# Patient Record
Sex: Male | Born: 1945 | Race: Black or African American | Hispanic: No | Marital: Married | Smoking: Never smoker
Health system: Southern US, Community
[De-identification: ages and names within clinical notes are randomized; demographics above are authoritative.]

## PROBLEM LIST (undated history)

## (undated) DIAGNOSIS — M1990 Unspecified osteoarthritis, unspecified site: Secondary | ICD-10-CM

## (undated) DIAGNOSIS — G4730 Sleep apnea, unspecified: Secondary | ICD-10-CM

## (undated) DIAGNOSIS — I10 Essential (primary) hypertension: Secondary | ICD-10-CM

## (undated) DIAGNOSIS — E119 Type 2 diabetes mellitus without complications: Secondary | ICD-10-CM

## (undated) DIAGNOSIS — K219 Gastro-esophageal reflux disease without esophagitis: Secondary | ICD-10-CM

## (undated) HISTORY — PX: NECK SURGERY: SHX720

## (undated) HISTORY — PX: BACK SURGERY: SHX140

## (undated) HISTORY — PX: SHOULDER SURGERY: SHX246

## (undated) HISTORY — DX: Sleep apnea, unspecified: G47.30

## (undated) HISTORY — PX: TONSILLECTOMY: SUR1361

## (undated) HISTORY — PX: EYE SURGERY: SHX253

## (undated) HISTORY — DX: Unspecified osteoarthritis, unspecified site: M19.90

## (undated) HISTORY — PX: APPENDECTOMY: SHX54

## (undated) HISTORY — DX: Gastro-esophageal reflux disease without esophagitis: K21.9

---

## 1973-09-22 HISTORY — PX: BACK SURGERY: SHX140

## 1997-09-22 HISTORY — PX: NECK SURGERY: SHX720

## 1998-09-22 HISTORY — PX: SHOULDER SURGERY: SHX246

## 2014-09-20 ENCOUNTER — Encounter (INDEPENDENT_AMBULATORY_CARE_PROVIDER_SITE_OTHER): Payer: Self-pay

## 2014-09-20 ENCOUNTER — Encounter (INDEPENDENT_AMBULATORY_CARE_PROVIDER_SITE_OTHER): Payer: Self-pay | Admitting: *Deleted

## 2014-12-21 ENCOUNTER — Other Ambulatory Visit (INDEPENDENT_AMBULATORY_CARE_PROVIDER_SITE_OTHER): Payer: Self-pay | Admitting: *Deleted

## 2014-12-21 DIAGNOSIS — Z86010 Personal history of colonic polyps: Secondary | ICD-10-CM

## 2015-01-19 ENCOUNTER — Telehealth (INDEPENDENT_AMBULATORY_CARE_PROVIDER_SITE_OTHER): Payer: Self-pay | Admitting: *Deleted

## 2015-01-19 DIAGNOSIS — Z1211 Encounter for screening for malignant neoplasm of colon: Secondary | ICD-10-CM

## 2015-01-19 MED ORDER — PEG 3350-KCL-NA BICARB-NACL 420 G PO SOLR: 4000.0000 mL | Freq: Once | ORAL | Status: DC

## 2015-01-19 NOTE — Telephone Encounter (Signed)
Patient needs trilyte 

## 2015-01-26 ENCOUNTER — Telehealth (INDEPENDENT_AMBULATORY_CARE_PROVIDER_SITE_OTHER): Payer: Self-pay | Admitting: *Deleted

## 2015-01-26 NOTE — Telephone Encounter (Signed)
PCP: shyam balakrishnan   Procedure: tcs  Reason/Indication:  Hx polyps  Has patient had this procedure before?  Yes, 2009 -- sacnned  If so, when, by whom and where?    Is there a family history of colon cancer?  no  Who?  What age when diagnosed?    Is patient diabetic?   no      Does patient have prosthetic heart valve?  no  Do you have a pacemaker?  no  Has patient ever had endocarditis? no  Has patient had joint replacement within last 12 months?  no  Does patient tend to be constipated or take laxatives? no  Is patient on Coumadin, Plavix and/or Aspirin? no  Medications: lisinopril 20 mg daily, metformin 500 mg daily  Allergies: nkda  Medication Adjustment: hold metformin evening before procedure  Procedure date & time: 02/21/15 at 1030

## 2015-02-01 NOTE — Telephone Encounter (Signed)
agree

## 2015-02-21 ENCOUNTER — Encounter (HOSPITAL_COMMUNITY)
Admission: RE | Disposition: A | Discharge status: Home / Self Care | Payer: Self-pay | Source: Ambulatory Visit | Attending: Internal Medicine

## 2015-02-21 ENCOUNTER — Encounter (HOSPITAL_COMMUNITY): Payer: Self-pay | Admitting: *Deleted

## 2015-02-21 ENCOUNTER — Ambulatory Visit (HOSPITAL_COMMUNITY)
Admission: RE | Admit: 2015-02-21 | Discharge: 2015-02-21 | Disposition: A | Discharge status: Home / Self Care | Payer: Medicare Other | Source: Ambulatory Visit | Attending: Internal Medicine | Admitting: Internal Medicine

## 2015-02-21 DIAGNOSIS — Z7982 Long term (current) use of aspirin: Secondary | ICD-10-CM | POA: Diagnosis not present

## 2015-02-21 DIAGNOSIS — Z79899 Other long term (current) drug therapy: Secondary | ICD-10-CM | POA: Diagnosis not present

## 2015-02-21 DIAGNOSIS — E119 Type 2 diabetes mellitus without complications: Secondary | ICD-10-CM | POA: Diagnosis not present

## 2015-02-21 DIAGNOSIS — D122 Benign neoplasm of ascending colon: Secondary | ICD-10-CM | POA: Insufficient documentation

## 2015-02-21 DIAGNOSIS — D125 Benign neoplasm of sigmoid colon: Secondary | ICD-10-CM | POA: Diagnosis not present

## 2015-02-21 DIAGNOSIS — Z86010 Personal history of colonic polyps: Secondary | ICD-10-CM | POA: Insufficient documentation

## 2015-02-21 DIAGNOSIS — K644 Residual hemorrhoidal skin tags: Secondary | ICD-10-CM | POA: Insufficient documentation

## 2015-02-21 DIAGNOSIS — Z09 Encounter for follow-up examination after completed treatment for conditions other than malignant neoplasm: Secondary | ICD-10-CM | POA: Diagnosis present

## 2015-02-21 DIAGNOSIS — K648 Other hemorrhoids: Secondary | ICD-10-CM | POA: Diagnosis not present

## 2015-02-21 DIAGNOSIS — D123 Benign neoplasm of transverse colon: Secondary | ICD-10-CM

## 2015-02-21 DIAGNOSIS — I10 Essential (primary) hypertension: Secondary | ICD-10-CM | POA: Diagnosis not present

## 2015-02-21 DIAGNOSIS — K5730 Diverticulosis of large intestine without perforation or abscess without bleeding: Secondary | ICD-10-CM | POA: Diagnosis not present

## 2015-02-21 HISTORY — PX: COLONOSCOPY: SHX5424

## 2015-02-21 HISTORY — DX: Type 2 diabetes mellitus without complications: E11.9

## 2015-02-21 HISTORY — DX: Essential (primary) hypertension: I10

## 2015-02-21 SURGERY — COLONOSCOPY
Anesthesia: Moderate Sedation

## 2015-02-21 MED ORDER — MIDAZOLAM HCL 5 MG/5ML IJ SOLN
INTRAMUSCULAR | Status: DC
Start: 2015-02-21 — End: 2015-02-21
  Filled 2015-02-21: qty 10

## 2015-02-21 MED ORDER — STERILE WATER FOR IRRIGATION IR SOLN
Status: DC | PRN
  Administered 2015-02-21: 10:00:00

## 2015-02-21 MED ORDER — SODIUM CHLORIDE 0.9 % IV SOLN
INTRAVENOUS | Status: DC
  Administered 2015-02-21: 1000 mL via INTRAVENOUS

## 2015-02-21 MED ORDER — MEPERIDINE HCL 50 MG/ML IJ SOLN
INTRAMUSCULAR | Status: DC | PRN
  Administered 2015-02-21 (×2): 25 mg via INTRAVENOUS

## 2015-02-21 MED ORDER — MEPERIDINE HCL 50 MG/ML IJ SOLN
INTRAMUSCULAR | Status: AC
  Filled 2015-02-21: qty 1

## 2015-02-21 MED ORDER — MIDAZOLAM HCL 5 MG/5ML IJ SOLN
INTRAMUSCULAR | Status: DC | PRN
  Administered 2015-02-21: 1 mg via INTRAVENOUS
  Administered 2015-02-21 (×3): 2 mg via INTRAVENOUS

## 2015-02-21 NOTE — Op Note (Signed)
COLONOSCOPY PROCEDURE REPORT  PATIENT:  Lawrence [name]  MR#:  [id number] Birthdate:  19-Apr-1946, 69 y.o., male Endoscopist:  Dr. Rogene Houston, MD Referred By:  Dr. Lanier Clam, M.D.  Procedure Date: 02/21/2015  Procedure:   Colonoscopy  Indications:  Patient is 69 year old African-American male with small tubular adenoma removed 7 years ago. He is here for surveillance colonoscopy. He has no GI symptoms. Family history is negative for CRC.  Informed Consent:  The procedure and risks were reviewed with the patient and informed consent was obtained.  Medications:  Demerol 50 mg IV Versed 7 mg IV  Description of procedure:  After a digital rectal exam was performed, that colonoscope was advanced from the anus through the rectum and colon to the area of the cecum, ileocecal valve and appendiceal orifice. The cecum was deeply intubated. These structures were well-seen and photographed for the record. From the level of the cecum and ileocecal valve, the scope was slowly and cautiously withdrawn. The mucosal surfaces were carefully surveyed utilizing scope tip to flexion to facilitate fold flattening as needed. The scope was pulled down into the rectum where a thorough exam including retroflexion was performed.  Findings:   Prep satisfactory. Two small polyps were cold snared from ascending colon. Two small polyps were cold snared from transverse colon and one was lost. All of these polyps were submitted together. There was question of another polyp in the region of splenic flexure but none found on the way out. Normal rectal mucosa. Dominant hemorrhoids below the dentate line.   Therapeutic/Diagnostic Maneuvers Performed:  See above  Complications:  None  Cecal Withdrawal Time:  31 minutes  Impression:  Examination performed to cecum. Four  small polyps were cold snared and one was lost. These 3 polyps were submitted together. Two of these are located at ascending colon and  two at transverse colon. External hemorrhoids.  Recommendations:  Standard instructions given. I will contact patient with biopsy results and further recommendations.  REHMAN,NAJEEB U  02/21/2015 11:40 AM  CC: Dr. Lanier Clam, MD & Dr. Rayne Du ref. provider found

## 2015-02-21 NOTE — Discharge Instructions (Signed)
Resume usual medications and diet. No driving for 24 hours. Physician will call with biopsy results.Colonoscopy A colonoscopy is an exam to look at the entire large intestine (colon). This exam can help find problems such as tumors, polyps, inflammation, and areas of bleeding. The exam takes about 1 hour.  LET [facility] CARE PROVIDER KNOW ABOUT:   Any allergies you have.  All medicines you are taking, including vitamins, herbs, eye drops, creams, and over-the-counter medicines.  Previous problems you or members of your family have had with the use of anesthetics.  Any blood disorders you have.  Previous surgeries you have had.  Medical conditions you have. RISKS AND COMPLICATIONS  Generally, this is a safe procedure. However, as with any procedure, complications can occur. Possible complications include:  Bleeding.  Tearing or rupture of the colon wall.  Reaction to medicines given during the exam.  Infection (rare). BEFORE THE PROCEDURE   Ask your health care provider about changing or stopping your regular medicines.  You may be prescribed an oral bowel prep. This involves drinking a large amount of medicated liquid, starting the day before your procedure. The liquid will cause you to have multiple loose stools until your stool is almost clear or light green. This cleans out your colon in preparation for the procedure.  Do not eat or drink anything else once you have started the bowel prep, unless your health care provider tells you it is safe to do so.  Arrange for someone to drive you home after the procedure. PROCEDURE   You will be given medicine to help you relax (sedative).  You will lie on your side with your knees bent.  A long, flexible tube with a light and camera on the end (colonoscope) will be inserted through the rectum and into the colon. The camera sends video back to a computer screen as it moves through the colon. The colonoscope also releases carbon  dioxide gas to inflate the colon. This helps your health care provider see the area better.  During the exam, your health care provider may take a small tissue sample (biopsy) to be examined under a microscope if any abnormalities are found.  The exam is finished when the entire colon has been viewed. AFTER THE PROCEDURE   Do not drive for 24 hours after the exam.  You may have a small amount of blood in your stool.  You may pass moderate amounts of gas and have mild abdominal cramping or bloating. This is caused by the gas used to inflate your colon during the exam.  Ask when your test results will be ready and how you will get your results. Make sure you get your test results. Document Released: 09/05/2000 Document Revised: 06/29/2013 Document Reviewed: 05/16/2013 [facility] Patient Information 2015 [place], [place]. This information is not intended to replace advice given to you by your health care provider. Make sure you discuss any questions you have with your health care provider.  Biopsy Care After Refer to this sheet in the next few weeks. These instructions provide you with information on caring for yourself after your procedure. Your caregiver may also give you more specific instructions. Your treatment has been planned according to current medical practices, but problems sometimes occur. Call your caregiver if you have any problems or questions after your procedure. If you had a fine needle biopsy, you may have soreness at the biopsy site for 1 to 2 days. If you had an open biopsy, you may have soreness  at the biopsy site for 3 to 4 days. HOME CARE INSTRUCTIONS   You may resume normal diet and activities as directed.  Change bandages (dressings) as directed. If your wound was closed with a skin glue (adhesive), it will wear off and begin to peel in 7 days.  Only take over-the-counter or prescription medicines for pain, discomfort, or fever as directed by your caregiver.  Ask your  caregiver when you can bathe and get your wound wet. SEEK IMMEDIATE MEDICAL CARE IF:   You have increased bleeding (more than a small spot) from the biopsy site.  You notice redness, swelling, or increasing pain at the biopsy site.  You have pus coming from the biopsy site.  You have a fever.  You notice a bad smell coming from the biopsy site or dressing.  You have a rash, have difficulty breathing, or have any allergic problems. MAKE SURE YOU:   Understand these instructions.  Will watch your condition.  Will get help right away if you are not doing well or get worse. Document Released: 03/28/2005 Document Revised: 12/01/2011 Document Reviewed: 03/06/2011 [facility] Patient Information 2015 [place], [place]. This information is not intended to replace advice given to you by your health care provider. Make sure you discuss any questions you have with your health care provider.

## 2015-02-21 NOTE — H&P (Signed)
Lawrence [name] is an 69 y.o. male.   Chief Complaint: Patient is here for colonoscopy. HPI: Patient is 69 year old African-American male was here for surveillance colonoscopy. His last exam was in 2009. He had single small adenoma removed. Patient was advised to come back in 7 years rather than 5 or 10. He has no GI complaints. He denies abdominal pain change in bowel habits or rectal bleeding. Family history is negative for CRC.  Past Medical History  Diagnosis Date  . Hypertension   . Diabetes mellitus without complication     Past Surgical History  Procedure Laterality Date  . Neck surgery    . Back surgery      History reviewed. No pertinent family history. Social History:  reports that he has never smoked. He does not have any smokeless tobacco history on file. He reports that he does not drink alcohol or use illicit drugs.  Allergies: No Known Allergies  Medications Prior to Admission  Medication Sig Dispense Refill  . aspirin EC 81 MG tablet Take 81 mg by mouth every other day.    . lisinopril (PRINIVIL,ZESTRIL) 20 MG tablet Take 20 mg by mouth daily.    . metFORMIN (GLUCOPHAGE) 500 MG tablet Take 500 mg by mouth 2 (two) times daily with a meal.    . Multiple Vitamins-Minerals (CENTRUM) tablet Take 1 tablet by mouth daily.    . Multiple Vitamins-Minerals (PRESERVISION AREDS 2 PO) Take 1 tablet by mouth daily.    . Omega-3 Fatty Acids (FISH OIL PO) Take 1 capsule by mouth daily.    . polyethylene glycol-electrolytes (NULYTELY/GOLYTELY) 420 G solution Take 4,000 mLs by mouth once. 4000 mL 0  . Vitamin Mixture (ESTER-C PO) Take 1 tablet by mouth daily.      No results found for this or any previous visit (from the past 48 hour(s)). No results found.  ROS  Blood pressure 167/82, pulse 81, temperature 97.8 F (36.6 C), temperature source Oral, resp. rate 15, height 6\' 1"  (1.854 m), weight 190 lb (86.183 kg), SpO2 99 %. Physical Exam  Constitutional: He appears  well-developed and well-nourished.  HENT:  Mouth/Throat: Oropharynx is clear and moist.  Eyes: Conjunctivae are normal. No scleral icterus.  Neck: No thyromegaly present.  Cardiovascular: Normal rate, regular rhythm and normal heart sounds.   No murmur heard. Respiratory: Effort normal and breath sounds normal.  GI: Soft. He exhibits no distension and no mass. There is no tenderness.   appendectomy scar  Musculoskeletal: He exhibits no edema.  Lymphadenopathy:    He has no cervical adenopathy.  Neurological: He is alert.  Skin: Skin is warm and dry.     Assessment/Plan History of colonic tubular adenoma. Surveillance colonoscopy.  Carmelia Tiner U 02/21/2015, 10:29 AM

## 2015-02-22 ENCOUNTER — Encounter (HOSPITAL_COMMUNITY): Payer: Self-pay | Admitting: Internal Medicine

## 2015-02-26 ENCOUNTER — Encounter (INDEPENDENT_AMBULATORY_CARE_PROVIDER_SITE_OTHER): Payer: Self-pay | Admitting: *Deleted

## 2016-04-16 ENCOUNTER — Encounter (INDEPENDENT_AMBULATORY_CARE_PROVIDER_SITE_OTHER): Payer: Self-pay | Admitting: Ophthalmology

## 2016-12-05 ENCOUNTER — Encounter (INDEPENDENT_AMBULATORY_CARE_PROVIDER_SITE_OTHER): Payer: Medicare HMO | Admitting: Ophthalmology

## 2016-12-05 DIAGNOSIS — E113512 Type 2 diabetes mellitus with proliferative diabetic retinopathy with macular edema, left eye: Secondary | ICD-10-CM

## 2016-12-05 DIAGNOSIS — H35033 Hypertensive retinopathy, bilateral: Secondary | ICD-10-CM

## 2016-12-05 DIAGNOSIS — E11311 Type 2 diabetes mellitus with unspecified diabetic retinopathy with macular edema: Secondary | ICD-10-CM | POA: Diagnosis not present

## 2016-12-05 DIAGNOSIS — E113311 Type 2 diabetes mellitus with moderate nonproliferative diabetic retinopathy with macular edema, right eye: Secondary | ICD-10-CM

## 2016-12-05 DIAGNOSIS — H43813 Vitreous degeneration, bilateral: Secondary | ICD-10-CM | POA: Diagnosis not present

## 2016-12-05 DIAGNOSIS — I10 Essential (primary) hypertension: Secondary | ICD-10-CM | POA: Diagnosis not present

## 2016-12-18 ENCOUNTER — Other Ambulatory Visit (INDEPENDENT_AMBULATORY_CARE_PROVIDER_SITE_OTHER): Payer: Medicare HMO | Admitting: Ophthalmology

## 2016-12-18 DIAGNOSIS — E113511 Type 2 diabetes mellitus with proliferative diabetic retinopathy with macular edema, right eye: Secondary | ICD-10-CM

## 2016-12-18 DIAGNOSIS — E11311 Type 2 diabetes mellitus with unspecified diabetic retinopathy with macular edema: Secondary | ICD-10-CM

## 2017-02-26 ENCOUNTER — Other Ambulatory Visit (INDEPENDENT_AMBULATORY_CARE_PROVIDER_SITE_OTHER): Payer: Medicare HMO | Admitting: Ophthalmology

## 2017-02-26 DIAGNOSIS — E113313 Type 2 diabetes mellitus with moderate nonproliferative diabetic retinopathy with macular edema, bilateral: Secondary | ICD-10-CM | POA: Diagnosis not present

## 2017-02-26 DIAGNOSIS — E11311 Type 2 diabetes mellitus with unspecified diabetic retinopathy with macular edema: Secondary | ICD-10-CM

## 2017-06-29 ENCOUNTER — Ambulatory Visit (INDEPENDENT_AMBULATORY_CARE_PROVIDER_SITE_OTHER): Payer: Medicare HMO | Admitting: Ophthalmology

## 2017-06-29 DIAGNOSIS — H35033 Hypertensive retinopathy, bilateral: Secondary | ICD-10-CM

## 2017-06-29 DIAGNOSIS — E113592 Type 2 diabetes mellitus with proliferative diabetic retinopathy without macular edema, left eye: Secondary | ICD-10-CM

## 2017-06-29 DIAGNOSIS — E11311 Type 2 diabetes mellitus with unspecified diabetic retinopathy with macular edema: Secondary | ICD-10-CM | POA: Diagnosis not present

## 2017-06-29 DIAGNOSIS — H26491 Other secondary cataract, right eye: Secondary | ICD-10-CM | POA: Diagnosis not present

## 2017-06-29 DIAGNOSIS — H43813 Vitreous degeneration, bilateral: Secondary | ICD-10-CM

## 2017-06-29 DIAGNOSIS — H2512 Age-related nuclear cataract, left eye: Secondary | ICD-10-CM | POA: Diagnosis not present

## 2017-06-29 DIAGNOSIS — I10 Essential (primary) hypertension: Secondary | ICD-10-CM | POA: Diagnosis not present

## 2017-06-29 DIAGNOSIS — E113311 Type 2 diabetes mellitus with moderate nonproliferative diabetic retinopathy with macular edema, right eye: Secondary | ICD-10-CM

## 2017-07-09 ENCOUNTER — Encounter (INDEPENDENT_AMBULATORY_CARE_PROVIDER_SITE_OTHER): Payer: Medicare HMO | Admitting: Ophthalmology

## 2017-08-12 ENCOUNTER — Encounter (INDEPENDENT_AMBULATORY_CARE_PROVIDER_SITE_OTHER): Payer: Medicare HMO | Admitting: Ophthalmology

## 2017-08-12 DIAGNOSIS — H2701 Aphakia, right eye: Secondary | ICD-10-CM

## 2018-02-10 ENCOUNTER — Encounter (INDEPENDENT_AMBULATORY_CARE_PROVIDER_SITE_OTHER): Payer: Medicare HMO | Admitting: Ophthalmology

## 2018-02-19 ENCOUNTER — Encounter (INDEPENDENT_AMBULATORY_CARE_PROVIDER_SITE_OTHER): Payer: Medicare HMO | Admitting: Ophthalmology

## 2018-02-19 DIAGNOSIS — E113311 Type 2 diabetes mellitus with moderate nonproliferative diabetic retinopathy with macular edema, right eye: Secondary | ICD-10-CM | POA: Diagnosis not present

## 2018-02-19 DIAGNOSIS — H35033 Hypertensive retinopathy, bilateral: Secondary | ICD-10-CM | POA: Diagnosis not present

## 2018-02-19 DIAGNOSIS — H43813 Vitreous degeneration, bilateral: Secondary | ICD-10-CM

## 2018-02-19 DIAGNOSIS — I10 Essential (primary) hypertension: Secondary | ICD-10-CM | POA: Diagnosis not present

## 2018-02-19 DIAGNOSIS — E113392 Type 2 diabetes mellitus with moderate nonproliferative diabetic retinopathy without macular edema, left eye: Secondary | ICD-10-CM | POA: Diagnosis not present

## 2018-07-05 ENCOUNTER — Encounter (INDEPENDENT_AMBULATORY_CARE_PROVIDER_SITE_OTHER): Payer: Medicare HMO | Admitting: Ophthalmology

## 2018-07-05 DIAGNOSIS — H35033 Hypertensive retinopathy, bilateral: Secondary | ICD-10-CM

## 2018-07-05 DIAGNOSIS — E113312 Type 2 diabetes mellitus with moderate nonproliferative diabetic retinopathy with macular edema, left eye: Secondary | ICD-10-CM

## 2018-07-05 DIAGNOSIS — H59032 Cystoid macular edema following cataract surgery, left eye: Secondary | ICD-10-CM

## 2018-07-05 DIAGNOSIS — E113391 Type 2 diabetes mellitus with moderate nonproliferative diabetic retinopathy without macular edema, right eye: Secondary | ICD-10-CM

## 2018-07-05 DIAGNOSIS — E11311 Type 2 diabetes mellitus with unspecified diabetic retinopathy with macular edema: Secondary | ICD-10-CM

## 2018-07-05 DIAGNOSIS — I10 Essential (primary) hypertension: Secondary | ICD-10-CM

## 2018-07-05 DIAGNOSIS — H43813 Vitreous degeneration, bilateral: Secondary | ICD-10-CM

## 2018-08-16 ENCOUNTER — Encounter (INDEPENDENT_AMBULATORY_CARE_PROVIDER_SITE_OTHER): Payer: Medicare HMO | Admitting: Ophthalmology

## 2018-08-17 ENCOUNTER — Encounter (INDEPENDENT_AMBULATORY_CARE_PROVIDER_SITE_OTHER): Payer: Medicare HMO | Admitting: Ophthalmology

## 2018-08-17 DIAGNOSIS — H35033 Hypertensive retinopathy, bilateral: Secondary | ICD-10-CM

## 2018-08-17 DIAGNOSIS — E11311 Type 2 diabetes mellitus with unspecified diabetic retinopathy with macular edema: Secondary | ICD-10-CM | POA: Diagnosis not present

## 2018-08-17 DIAGNOSIS — E113313 Type 2 diabetes mellitus with moderate nonproliferative diabetic retinopathy with macular edema, bilateral: Secondary | ICD-10-CM | POA: Diagnosis not present

## 2018-08-17 DIAGNOSIS — H59031 Cystoid macular edema following cataract surgery, right eye: Secondary | ICD-10-CM

## 2018-08-17 DIAGNOSIS — I10 Essential (primary) hypertension: Secondary | ICD-10-CM | POA: Diagnosis not present

## 2018-08-17 DIAGNOSIS — H43813 Vitreous degeneration, bilateral: Secondary | ICD-10-CM

## 2018-08-27 ENCOUNTER — Encounter (INDEPENDENT_AMBULATORY_CARE_PROVIDER_SITE_OTHER): Payer: Medicare HMO | Admitting: Ophthalmology

## 2018-09-30 ENCOUNTER — Encounter (INDEPENDENT_AMBULATORY_CARE_PROVIDER_SITE_OTHER): Payer: Medicare HMO | Admitting: Ophthalmology

## 2018-10-26 ENCOUNTER — Encounter (INDEPENDENT_AMBULATORY_CARE_PROVIDER_SITE_OTHER): Payer: Medicare HMO | Admitting: Ophthalmology

## 2018-10-26 DIAGNOSIS — E113313 Type 2 diabetes mellitus with moderate nonproliferative diabetic retinopathy with macular edema, bilateral: Secondary | ICD-10-CM

## 2018-10-26 DIAGNOSIS — H59032 Cystoid macular edema following cataract surgery, left eye: Secondary | ICD-10-CM

## 2018-10-26 DIAGNOSIS — I10 Essential (primary) hypertension: Secondary | ICD-10-CM

## 2018-10-26 DIAGNOSIS — E11311 Type 2 diabetes mellitus with unspecified diabetic retinopathy with macular edema: Secondary | ICD-10-CM

## 2018-10-26 DIAGNOSIS — H35033 Hypertensive retinopathy, bilateral: Secondary | ICD-10-CM

## 2018-10-26 DIAGNOSIS — H43813 Vitreous degeneration, bilateral: Secondary | ICD-10-CM

## 2018-11-23 ENCOUNTER — Encounter (INDEPENDENT_AMBULATORY_CARE_PROVIDER_SITE_OTHER): Payer: Medicare HMO | Admitting: Ophthalmology

## 2018-11-23 DIAGNOSIS — E113313 Type 2 diabetes mellitus with moderate nonproliferative diabetic retinopathy with macular edema, bilateral: Secondary | ICD-10-CM | POA: Diagnosis not present

## 2018-11-23 DIAGNOSIS — I10 Essential (primary) hypertension: Secondary | ICD-10-CM

## 2018-11-23 DIAGNOSIS — H35033 Hypertensive retinopathy, bilateral: Secondary | ICD-10-CM | POA: Diagnosis not present

## 2018-11-23 DIAGNOSIS — E11311 Type 2 diabetes mellitus with unspecified diabetic retinopathy with macular edema: Secondary | ICD-10-CM

## 2018-11-23 DIAGNOSIS — H43813 Vitreous degeneration, bilateral: Secondary | ICD-10-CM

## 2018-12-21 ENCOUNTER — Encounter (INDEPENDENT_AMBULATORY_CARE_PROVIDER_SITE_OTHER): Payer: Medicare HMO | Admitting: Ophthalmology

## 2020-02-07 ENCOUNTER — Encounter (INDEPENDENT_AMBULATORY_CARE_PROVIDER_SITE_OTHER): Payer: Self-pay | Admitting: *Deleted

## 2020-10-27 LAB — MICROALBUMIN, URINE: Microalb, Ur: 12

## 2020-10-27 LAB — BASIC METABOLIC PANEL
BUN: 13 (ref 4–21)
Creatinine: 0.9 (ref 0.6–1.3)

## 2020-10-27 LAB — LIPID PANEL
LDL Cholesterol: 79
Triglycerides: 136 (ref 40–160)

## 2020-10-27 LAB — HEMOGLOBIN A1C: Hemoglobin A1C: 9.3

## 2020-10-27 LAB — VITAMIN D 25 HYDROXY (VIT D DEFICIENCY, FRACTURES): Vit D, 25-Hydroxy: 61.4

## 2020-10-27 LAB — TSH: TSH: 1.46 (ref 0.41–5.90)

## 2020-10-27 LAB — COMPREHENSIVE METABOLIC PANEL: GFR calc Af Amer: 100

## 2020-12-05 ENCOUNTER — Other Ambulatory Visit: Payer: Self-pay | Admitting: Internal Medicine

## 2020-12-05 DIAGNOSIS — I824Z3 Acute embolism and thrombosis of unspecified deep veins of distal lower extremity, bilateral: Secondary | ICD-10-CM

## 2020-12-05 NOTE — Progress Notes (Signed)
Subjective: 1. 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 BPH with urinary obstruction   3. Nocturia   4. Weak urinary stream   5. Urinary frequency      Consult requested by Dr. Jimmye Norman with [facility] a 75 yo male who presents with LUTS and ED.  He had the onset of symptoms about 1.5 years ago.  He is currently on Super Beta Prostate and ProstaGenix.   He reports an elevated PSA but the level was 0.3 in 2/22.  His IPSS is 22 with frequency, nocturia x 3, a reduced stream with intermittency and a sensation of incomplete emptying.  He has no hematuria or dysuria.   He has no prior GU history or family history of prostate cancer.  He has ED for the last 2+ years that has been progressive.  He can get a partial erection.  He has no penile pain or curvature.  He has not been able to reach a climax.  He has not had treatment yet.  He is a non-smoker.  He has had diabetes for 25 yrs.  He has no neuropathy.  ROS:  ROS  Allergies  Allergen Reactions  . Penicillins Hives  . Ranibizumab Rash    Past Medical History:  Diagnosis Date  . Arthritis   . Diabetes mellitus without complication ([place])   . GERD (gastroesophageal reflux disease)   . Hypertension   . Sleep apnea     Past Surgical History:  Procedure Laterality Date  . BACK SURGERY    . COLONOSCOPY N/A 02/21/2015   Procedure: COLONOSCOPY;  Surgeon: Rogene Houston, MD;  Location: [facility];  Service: Endoscopy;  Laterality: N/A;  1025  . NECK SURGERY    . SHOULDER SURGERY      Social History   Socioeconomic History  . Marital status: Married    Spouse name: Not on file  . Number of children: 3  . Years of education: Not on file  . Highest education level: Not on file  Occupational History  . Occupation: retired  Tobacco Use  . Smoking status: Never Smoker  . Smokeless tobacco: Never Used  Substance and Sexual Activity  . Alcohol use: No  . Drug use: No  . Sexual activity: Not  on file  Other Topics Concern  . Not on file  Social History Narrative  . Not on file   Social Determinants of Health   Financial Resource Strain: Not on file  Food Insecurity: Not on file  Transportation Needs: Not on file  Physical Activity: Not on file  Stress: Not on file  Social Connections: Not on file  Intimate Partner Violence: Not on file    History reviewed. No pertinent family history.  Anti-infectives: Anti-infectives (From admission, onward)   None      Current Outpatient Medications  Medication Sig Dispense Refill  . aspirin EC 81 MG tablet Take 81 mg by mouth every other day.    . lisinopril (PRINIVIL,ZESTRIL) 20 MG tablet Take 20 mg by mouth daily.    . metFORMIN (GLUCOPHAGE) 500 MG tablet Take 500 mg by mouth 2 (two) times daily with a meal.    . Multiple Vitamins-Minerals (CENTRUM) tablet Take 1 tablet by mouth daily.    . Multiple Vitamins-Minerals (PRESERVISION AREDS 2 PO) Take 1 tablet by mouth daily.    . Omega-3 Fatty Acids (FISH OIL PO) Take 1 capsule by mouth daily.    . tadalafil (CIALIS) 5 MG  tablet Take 1 tablet (5 mg total) by mouth daily as needed for erectile dysfunction. 30 tablet 11  . Vitamin Mixture (ESTER-C PO) Take 1 tablet by mouth daily.     No current facility-administered medications for this visit.     Objective: Vital signs in last 24 hours: BP (!) 164/72   Pulse 66   Temp 98.3 F (36.8 C)   Ht 6\' 1"  (1.854 m)   Wt 193 lb (87.5 kg)   BMI 25.46 kg/m   Intake/Output from previous day: No intake/output data recorded. Intake/Output this shift: @IOTHISSHIFT @   Physical Exam Vitals reviewed.  Constitutional:      Appearance: Normal appearance.  HENT:     Head: Normocephalic.  Cardiovascular:     Rate and Rhythm: Normal rate and regular rhythm.     Heart sounds: Normal heart sounds.  Pulmonary:     Effort: Pulmonary effort is normal. No respiratory distress.     Breath sounds: Normal breath sounds.  Abdominal:      General: Abdomen is flat.     Palpations: Abdomen is soft.     Hernia: No hernia is present.  Genitourinary:    Comments: Normal phallus with an adequate meatus. Scrotum, testes and epididymis. AP without lesions. NST without mass. Prostate 1.5+ benign. SV non-palpable.  Musculoskeletal:     Cervical back: Normal range of motion and neck supple.  Neurological:     Mental Status: He is alert.     Lab Results:  Results for orders placed or performed in visit on 12/06/20 (from the past 24 hour(s))  Urinalysis, Routine w reflex microscopic     Status: Abnormal   Collection Time: 12/06/20 10:47 AM  Result Value Ref Range   Specific Gravity, UA 1.015 1.005 - 1.030   pH, UA 7.0 5.0 - 7.5   Color, UA Yellow Yellow   Appearance Ur Clear Clear   Leukocytes,UA Negative Negative   Protein,UA Negative Negative/Trace   Glucose, UA 3+ (A) Negative   Ketones, UA Negative Negative   RBC, UA Negative Negative   Bilirubin, UA Negative Negative   Urobilinogen, Ur 0.2 0.2 - 1.0 mg/dL   Nitrite, UA Negative Negative   Microscopic Examination Comment    Narrative   Performed at:  [facility] [address], [place], [place]  [id number] Lab Director: Mina Marble MT, Phone:  [phone]    UA is clear.  PSA was 0.3 on 10/26/20.  Studies/Results: PVR is 156   Assessment/Plan: BPH with LUTS.   His PVR is 156 and he has moderate to severe LUTS.  I am going to try him on daily tadalafil 5mg  since he has ED as well.  I reviewed the risks and instructions in detail and gave him a goodrx coupon to use.  I will have him return in about a month for a flowrate, PVR and possible cystoscopy.   ED that is likely vascular with his long history of diabetes.    Glucosuria.  He has 3+ glucose in the urine today and that could be contributing to some of his voiding symptoms.  Meds ordered this encounter  Medications  . tadalafil (CIALIS) 5 MG tablet    Sig: Take 1 tablet (5 mg total)  by mouth daily as needed for erectile dysfunction.    Dispense:  30 tablet    Refill:  11    Will use Goodrx discount.     Orders Placed This Encounter  Procedures  . Urinalysis, Routine  w reflex microscopic  . Bladder scan     Return in about 4 weeks (around 01/03/2021) for 4-6 weeks for flow rate and PVR.  Possible cystoscopy. .    CC: [facility]     Irine Seal 12/06/2020 [phone]

## 2020-12-06 ENCOUNTER — Ambulatory Visit (HOSPITAL_COMMUNITY)
Admission: RE | Admit: 2020-12-06 | Discharge: 2020-12-06 | Disposition: A | Discharge status: Home / Self Care | Payer: Medicare HMO | Source: Ambulatory Visit | Attending: Internal Medicine | Admitting: Internal Medicine

## 2020-12-06 ENCOUNTER — Encounter: Payer: Self-pay | Admitting: Urology

## 2020-12-06 ENCOUNTER — Other Ambulatory Visit: Payer: Self-pay

## 2020-12-06 ENCOUNTER — Ambulatory Visit (INDEPENDENT_AMBULATORY_CARE_PROVIDER_SITE_OTHER): Payer: Medicare HMO | Admitting: Urology

## 2020-12-06 VITALS — BP 164/72 | HR 66 | Temp 98.3°F | Ht 73.0 in | Wt 193.0 lb

## 2020-12-06 DIAGNOSIS — R351 Nocturia: Secondary | ICD-10-CM

## 2020-12-06 DIAGNOSIS — R350 Frequency of micturition: Secondary | ICD-10-CM

## 2020-12-06 DIAGNOSIS — N521 Erectile dysfunction due to diseases classified elsewhere: Secondary | ICD-10-CM

## 2020-12-06 DIAGNOSIS — I824Z3 Acute embolism and thrombosis of unspecified deep veins of distal lower extremity, bilateral: Secondary | ICD-10-CM | POA: Diagnosis not present

## 2020-12-06 DIAGNOSIS — E1169 Type 2 diabetes mellitus with other specified complication: Secondary | ICD-10-CM | POA: Diagnosis not present

## 2020-12-06 DIAGNOSIS — R339 Retention of urine, unspecified: Secondary | ICD-10-CM

## 2020-12-06 DIAGNOSIS — N401 Enlarged prostate with lower urinary tract symptoms: Secondary | ICD-10-CM | POA: Diagnosis not present

## 2020-12-06 DIAGNOSIS — R3912 Poor urinary stream: Secondary | ICD-10-CM | POA: Diagnosis not present

## 2020-12-06 DIAGNOSIS — N138 Other obstructive and reflux uropathy: Secondary | ICD-10-CM

## 2020-12-06 DIAGNOSIS — N529 Male erectile dysfunction, unspecified: Secondary | ICD-10-CM

## 2020-12-06 LAB — URINALYSIS, ROUTINE W REFLEX MICROSCOPIC
Bilirubin, UA: NEGATIVE
Ketones, UA: NEGATIVE
Leukocytes,UA: NEGATIVE
Nitrite, UA: NEGATIVE
Protein,UA: NEGATIVE
RBC, UA: NEGATIVE
Specific Gravity, UA: 1.015 (ref 1.005–1.030)
Urobilinogen, Ur: 0.2 mg/dL (ref 0.2–1.0)
pH, UA: 7 (ref 5.0–7.5)

## 2020-12-06 LAB — BLADDER SCAN: Scan Result: 156

## 2020-12-06 MED ORDER — TADALAFIL 5 MG PO TABS: 5.0000 mg | ORAL_TABLET | Freq: Every day | ORAL | 11 refills | Status: DC | PRN

## 2020-12-06 NOTE — Progress Notes (Signed)
Urological Symptom Review  Patient is experiencing the following symptoms: Frequent urination Hard to postpone urination Get up at night to urinate Leakage of urine Stream starts and stops Trouble starting stream Weak stream Erection problems (male only)   Review of Systems  Gastrointestinal (upper)  : Negative for upper GI symptoms  Gastrointestinal (lower) : Negative for lower GI symptoms  Constitutional : Negative for symptoms  Skin: Negative for skin symptoms  Eyes: Blurred vision  Ear/Nose/Throat : Negative for Ear/Nose/Throat symptoms  Hematologic/Lymphatic: Negative for Hematologic/Lymphatic symptoms  Cardiovascular : Leg swelling  Respiratory : None  Endocrine: Negative for endocrine symptoms  Musculoskeletal: Negative for musculoskeletal symptoms  Neurological: Negative for neurological symptoms  Psychologic: Negative for psychiatric symptoms

## 2020-12-06 NOTE — Progress Notes (Signed)
Bladder Scan Patient can void: 156 ml Performed By: Durenda Guthrie, lpn

## 2020-12-23 ENCOUNTER — Other Ambulatory Visit: Payer: Self-pay

## 2020-12-23 ENCOUNTER — Emergency Department (HOSPITAL_COMMUNITY): Payer: Medicare HMO

## 2020-12-23 ENCOUNTER — Inpatient Hospital Stay (HOSPITAL_COMMUNITY)
Admission: EM | Admit: 2020-12-23 | Discharge: 2020-12-25 | DRG: 282 | Disposition: A | Discharge status: Home / Self Care | Payer: Medicare HMO | Attending: Cardiovascular Disease | Admitting: Cardiovascular Disease

## 2020-12-23 ENCOUNTER — Inpatient Hospital Stay (HOSPITAL_COMMUNITY)
Admission: EM | Disposition: A | Discharge status: Home / Self Care | Payer: Self-pay | Source: Home / Self Care | Attending: Cardiovascular Disease

## 2020-12-23 ENCOUNTER — Encounter (HOSPITAL_COMMUNITY): Payer: Self-pay

## 2020-12-23 ENCOUNTER — Other Ambulatory Visit (HOSPITAL_COMMUNITY): Payer: Self-pay

## 2020-12-23 DIAGNOSIS — I10 Essential (primary) hypertension: Secondary | ICD-10-CM | POA: Diagnosis present

## 2020-12-23 DIAGNOSIS — R9431 Abnormal electrocardiogram [ECG] [EKG]: Secondary | ICD-10-CM

## 2020-12-23 DIAGNOSIS — I319 Disease of pericardium, unspecified: Secondary | ICD-10-CM | POA: Diagnosis present

## 2020-12-23 DIAGNOSIS — Z888 Allergy status to other drugs, medicaments and biological substances status: Secondary | ICD-10-CM | POA: Diagnosis not present

## 2020-12-23 DIAGNOSIS — G4733 Obstructive sleep apnea (adult) (pediatric): Secondary | ICD-10-CM | POA: Diagnosis present

## 2020-12-23 DIAGNOSIS — E118 Type 2 diabetes mellitus with unspecified complications: Secondary | ICD-10-CM | POA: Diagnosis not present

## 2020-12-23 DIAGNOSIS — K219 Gastro-esophageal reflux disease without esophagitis: Secondary | ICD-10-CM | POA: Diagnosis present

## 2020-12-23 DIAGNOSIS — I309 Acute pericarditis, unspecified: Secondary | ICD-10-CM | POA: Diagnosis not present

## 2020-12-23 DIAGNOSIS — R079 Chest pain, unspecified: Secondary | ICD-10-CM | POA: Diagnosis present

## 2020-12-23 DIAGNOSIS — Z79899 Other long term (current) drug therapy: Secondary | ICD-10-CM

## 2020-12-23 DIAGNOSIS — I213 ST elevation (STEMI) myocardial infarction of unspecified site: Secondary | ICD-10-CM | POA: Diagnosis present

## 2020-12-23 DIAGNOSIS — E119 Type 2 diabetes mellitus without complications: Secondary | ICD-10-CM | POA: Diagnosis present

## 2020-12-23 DIAGNOSIS — Z20822 Contact with and (suspected) exposure to covid-19: Secondary | ICD-10-CM | POA: Diagnosis present

## 2020-12-23 DIAGNOSIS — Z7984 Long term (current) use of oral hypoglycemic drugs: Secondary | ICD-10-CM

## 2020-12-23 DIAGNOSIS — E1165 Type 2 diabetes mellitus with hyperglycemia: Secondary | ICD-10-CM | POA: Diagnosis present

## 2020-12-23 DIAGNOSIS — Z880 Allergy status to penicillin: Secondary | ICD-10-CM

## 2020-12-23 DIAGNOSIS — R072 Precordial pain: Secondary | ICD-10-CM | POA: Diagnosis present

## 2020-12-23 DIAGNOSIS — Z7982 Long term (current) use of aspirin: Secondary | ICD-10-CM

## 2020-12-23 HISTORY — PX: LEFT HEART CATH AND CORONARY ANGIOGRAPHY: CATH118249

## 2020-12-23 LAB — PROTIME-INR
INR: 1 (ref 0.8–1.2)
Prothrombin Time: 13 seconds (ref 11.4–15.2)

## 2020-12-23 LAB — CBC
HCT: 40.9 % (ref 39.0–52.0)
Hemoglobin: 13.7 g/dL (ref 13.0–17.0)
MCH: 32.2 pg (ref 26.0–34.0)
MCHC: 33.5 g/dL (ref 30.0–36.0)
MCV: 96.2 fL (ref 80.0–100.0)
Platelets: 226 10*3/uL (ref 150–400)
RBC: 4.25 MIL/uL (ref 4.22–5.81)
RDW: 10.8 % — ABNORMAL LOW (ref 11.5–15.5)
WBC: 11 10*3/uL — ABNORMAL HIGH (ref 4.0–10.5)
nRBC: 0 % (ref 0.0–0.2)

## 2020-12-23 LAB — BASIC METABOLIC PANEL
Anion gap: 10 (ref 5–15)
BUN: 12 mg/dL (ref 8–23)
CO2: 25 mmol/L (ref 22–32)
Calcium: 9.2 mg/dL (ref 8.9–10.3)
Chloride: 96 mmol/L — ABNORMAL LOW (ref 98–111)
Creatinine, Ser: 0.86 mg/dL (ref 0.61–1.24)
GFR, Estimated: >60 mL/min (ref >60)
Glucose, Bld: 408 mg/dL — ABNORMAL HIGH (ref 70–99)
Potassium: 4.2 mmol/L (ref 3.5–5.1)
Sodium: 131 mmol/L — ABNORMAL LOW (ref 135–145)

## 2020-12-23 LAB — RESP PANEL BY RT-PCR (FLU A&B, COVID) ARPGX2
Influenza A by PCR: NEGATIVE
Influenza B by PCR: NEGATIVE
SARS Coronavirus 2 by RT PCR: NEGATIVE

## 2020-12-23 LAB — APTT: aPTT: 27 seconds (ref 24–36)

## 2020-12-23 LAB — LIPID PANEL
Cholesterol: 177 mg/dL (ref 0–200)
HDL: 68 mg/dL (ref >40)
LDL Cholesterol: 95 mg/dL (ref 0–99)
Total CHOL/HDL Ratio: 2.6 RATIO
Triglycerides: 70 mg/dL (ref <150)
VLDL: 14 mg/dL (ref 0–40)

## 2020-12-23 LAB — TROPONIN I (HIGH SENSITIVITY)
Troponin I (High Sensitivity): 8 ng/L (ref <18)
Troponin I (High Sensitivity): 7 ng/L (ref <18)

## 2020-12-23 SURGERY — LEFT HEART CATH AND CORONARY ANGIOGRAPHY
Anesthesia: LOCAL

## 2020-12-23 MED ORDER — ASPIRIN 81 MG PO CHEW
CHEWABLE_TABLET | ORAL | Status: AC
  Filled 2020-12-23: qty 3

## 2020-12-23 MED ORDER — LIDOCAINE HCL (PF) 1 % IJ SOLN
INTRAMUSCULAR | Status: DC | PRN
  Administered 2020-12-23: 5 mL via SUBCUTANEOUS

## 2020-12-23 MED ORDER — SODIUM CHLORIDE 0.9 % IV SOLN: INTRAVENOUS | Status: AC

## 2020-12-23 MED ORDER — NITROGLYCERIN 0.4 MG SL SUBL
0.4000 mg | SUBLINGUAL_TABLET | Freq: Once | SUBLINGUAL | Status: AC
  Administered 2020-12-23: 0.4 mg via SUBLINGUAL
  Filled 2020-12-23: qty 1

## 2020-12-23 MED ORDER — SODIUM CHLORIDE 0.9% FLUSH: 3.0000 mL | INTRAVENOUS | Status: DC | PRN

## 2020-12-23 MED ORDER — HEPARIN SODIUM (PORCINE) 1000 UNIT/ML IJ SOLN
INTRAMUSCULAR | Status: DC | PRN
  Administered 2020-12-23: 4000 [IU] via INTRAVENOUS

## 2020-12-23 MED ORDER — ASPIRIN 325 MG PO TABS
ORAL_TABLET | ORAL | Status: AC
  Filled 2020-12-23: qty 1

## 2020-12-23 MED ORDER — ACETAMINOPHEN 325 MG PO TABS: 650.0000 mg | ORAL_TABLET | ORAL | Status: DC | PRN

## 2020-12-23 MED ORDER — SODIUM CHLORIDE 0.9 % IV SOLN
INTRAVENOUS | Status: DC
  Administered 2020-12-23: 10 mL/h via INTRAVENOUS

## 2020-12-23 MED ORDER — FENTANYL CITRATE (PF) 100 MCG/2ML IJ SOLN
INTRAMUSCULAR | Status: DC | PRN
  Administered 2020-12-23: 25 ug via INTRAVENOUS

## 2020-12-23 MED ORDER — ONDANSETRON HCL 4 MG/2ML IJ SOLN: 4.0000 mg | Freq: Four times a day (QID) | INTRAMUSCULAR | Status: DC | PRN

## 2020-12-23 MED ORDER — VERAPAMIL HCL 2.5 MG/ML IV SOLN
INTRAVENOUS | Status: DC | PRN
  Administered 2020-12-23: 10 mL via INTRA_ARTERIAL

## 2020-12-23 MED ORDER — SODIUM CHLORIDE 0.9 % IV SOLN
INTRAVENOUS | Status: AC | PRN
  Administered 2020-12-23: 100 mL/h via INTRAVENOUS

## 2020-12-23 MED ORDER — ASPIRIN 81 MG PO CHEW
324.0000 mg | CHEWABLE_TABLET | Freq: Once | ORAL | Status: AC
  Administered 2020-12-23: 324 mg via ORAL
  Filled 2020-12-23: qty 4

## 2020-12-23 MED ORDER — VERAPAMIL HCL 2.5 MG/ML IV SOLN
INTRAVENOUS | Status: AC
  Filled 2020-12-23: qty 2

## 2020-12-23 MED ORDER — LIDOCAINE HCL (PF) 1 % IJ SOLN
INTRAMUSCULAR | Status: AC
  Filled 2020-12-23: qty 30

## 2020-12-23 MED ORDER — IOHEXOL 350 MG/ML SOLN
INTRAVENOUS | Status: AC
  Filled 2020-12-23: qty 1

## 2020-12-23 MED ORDER — HYDRALAZINE HCL 20 MG/ML IJ SOLN: 10.0000 mg | INTRAMUSCULAR | Status: AC | PRN

## 2020-12-23 MED ORDER — FENTANYL CITRATE (PF) 100 MCG/2ML IJ SOLN
INTRAMUSCULAR | Status: AC
  Filled 2020-12-23: qty 2

## 2020-12-23 MED ORDER — LABETALOL HCL 5 MG/ML IV SOLN
10.0000 mg | INTRAVENOUS | Status: AC | PRN
  Administered 2020-12-23 – 2020-12-24 (×2): 10 mg via INTRAVENOUS
  Filled 2020-12-23 (×2): qty 4

## 2020-12-23 MED ORDER — HEPARIN SODIUM (PORCINE) 5000 UNIT/ML IJ SOLN
4000.0000 [IU] | Freq: Once | INTRAMUSCULAR | Status: AC
  Administered 2020-12-23: 4000 [IU] via INTRAVENOUS
  Filled 2020-12-23: qty 1

## 2020-12-23 MED ORDER — IBUPROFEN 600 MG PO TABS
800.0000 mg | ORAL_TABLET | Freq: Once | ORAL | Status: AC
  Administered 2020-12-23: 800 mg via ORAL
  Filled 2020-12-23: qty 1

## 2020-12-23 MED ORDER — MIDAZOLAM HCL 2 MG/2ML IJ SOLN
INTRAMUSCULAR | Status: DC | PRN
  Administered 2020-12-23: 2 mg via INTRAVENOUS

## 2020-12-23 MED ORDER — HEPARIN SODIUM (PORCINE) 1000 UNIT/ML IJ SOLN
INTRAMUSCULAR | Status: AC
  Filled 2020-12-23: qty 1

## 2020-12-23 MED ORDER — ASPIRIN 81 MG PO CHEW
81.0000 mg | CHEWABLE_TABLET | Freq: Every day | ORAL | Status: DC
  Administered 2020-12-24: 81 mg via ORAL
  Filled 2020-12-23: qty 1

## 2020-12-23 MED ORDER — SODIUM CHLORIDE 0.9% FLUSH
3.0000 mL | Freq: Two times a day (BID) | INTRAVENOUS | Status: DC
  Administered 2020-12-23 – 2020-12-24 (×3): 3 mL via INTRAVENOUS

## 2020-12-23 MED ORDER — DIAZEPAM 5 MG PO TABS: 5.0000 mg | ORAL_TABLET | Freq: Four times a day (QID) | ORAL | Status: DC | PRN

## 2020-12-23 MED ORDER — IOHEXOL 350 MG/ML SOLN
INTRAVENOUS | Status: DC | PRN
  Administered 2020-12-23: 50 mL via INTRA_ARTERIAL

## 2020-12-23 MED ORDER — SODIUM CHLORIDE 0.9 % IV SOLN: 250.0000 mL | INTRAVENOUS | Status: DC | PRN

## 2020-12-23 MED ORDER — MIDAZOLAM HCL 2 MG/2ML IJ SOLN
INTRAMUSCULAR | Status: AC
  Filled 2020-12-23: qty 2

## 2020-12-23 MED ORDER — HEPARIN (PORCINE) IN NACL 1000-0.9 UT/500ML-% IV SOLN
INTRAVENOUS | Status: AC
  Filled 2020-12-23: qty 1500

## 2020-12-23 SURGICAL SUPPLY — 14 items
BAG SNAP BAND KOVER 36X36 (MISCELLANEOUS) ×2 IMPLANT
CATH INFINITI JR4 5F (CATHETERS) ×2 IMPLANT
CATH OPTITORQUE TIG 4.0 5F (CATHETERS) ×2 IMPLANT
COVER DOME SNAP 22 D (MISCELLANEOUS) ×2 IMPLANT
DEVICE RAD COMP TR BAND LRG (VASCULAR PRODUCTS) ×2 IMPLANT
GLIDESHEATH SLEND SS 6F .021 (SHEATH) ×2 IMPLANT
GUIDEWIRE INQWIRE 1.5J.035X260 (WIRE) ×1 IMPLANT
INQWIRE 1.5J .035X260CM (WIRE) ×2
KIT ENCORE 26 ADVANTAGE (KITS) ×2 IMPLANT
KIT HEART LEFT (KITS) ×2 IMPLANT
PACK CARDIAC CATHETERIZATION (CUSTOM PROCEDURE TRAY) ×2 IMPLANT
SHEATH PROBE COVER 6X72 (BAG) ×2 IMPLANT
TRANSDUCER W/STOPCOCK (MISCELLANEOUS) ×2 IMPLANT
TUBING CIL FLEX 10 FLL-RA (TUBING) ×2 IMPLANT

## 2020-12-23 NOTE — ED Notes (Signed)
X-ray at bedside

## 2020-12-23 NOTE — H&P (Signed)
Cardiology Admission History and Physical:   Patient ID: Lawrence [name] MRN: [id number]; DOB: 1945/12/23   Admission date: 12/23/2020  PCP:  [facility] [place]  Cardiologist:  No primary care provider on file.  Advanced Practice Provider:  No care team member to display Electrophysiologist:  None        Chief Complaint:  CP, abn EKG possible STEMI  Patient Profile:   Lawrence [name] is a 75 y.o. male with no prior past cardiac hx but w/ h/o DM2, HTN, OSA presents to [facility] c/o CP; transferred for possible STEMI.  History of Present Illness:   Lawrence [name] is a 75 y.o. male with no prior past cardiac hx but w/ h/o DM2, HTN, OSA presents to [facility] c/o CP. Pt states pain started around 1pm earlier today while he was in church and has gradually increased in intensity throughout the day today. It is a sharp pain, 9/10, in the center of his chest. He states it is worse when he bends over or takes a deep breath. Initial HS trop 8. EKG at [facility] showed [facility] elevation that appears more c/w early repolarization pattern or pericarditis than acute MI, but pt transferred for urgent LHC for possible STEMI.   Past Medical History:  Diagnosis Date  . Arthritis   . Diabetes mellitus without complication ([place])   . GERD (gastroesophageal reflux disease)   . Hypertension   . Sleep apnea     Past Surgical History:  Procedure Laterality Date  . BACK SURGERY    . COLONOSCOPY N/A 02/21/2015   Procedure: COLONOSCOPY;  Surgeon: Rogene Houston, MD;  Location[facility];  Service: Endoscopy;  Laterality: N/A;  1025  . NECK SURGERY    . SHOULDER SURGERY       Medications Prior to Admission: Prior to Admission medications   Medication Sig Start Date End Date Taking? Authorizing Provider  amLODipine (NORVASC) 5 MG tablet Take 1 tablet by mouth daily. 11/09/19  Yes [provider]  aspirin EC 81 MG tablet Take 81 mg by mouth every other day.    [provider]  lisinopril (PRINIVIL,ZESTRIL) 20 MG tablet Take 20 mg by mouth daily.    [provider]  metFORMIN (GLUCOPHAGE) 500 MG tablet Take 500 mg by mouth 2 (two) times daily with a meal.    [provider]  metFORMIN (GLUCOPHAGE-XR) 500 MG 24 hr tablet Take 1,000 mg by mouth 2 (two) times daily. 12/06/20   [provider]  Multiple Vitamins-Minerals (CENTRUM) tablet Take 1 tablet by mouth daily.    [provider]  Multiple Vitamins-Minerals (PRESERVISION AREDS 2 PO) Take 1 tablet by mouth daily.    [provider]  Omega-3 Fatty Acids (FISH OIL PO) Take 1 capsule by mouth daily.    [provider]  tadalafil (CIALIS) 5 MG tablet Take 1 tablet (5 mg total) by mouth daily as needed for erectile dysfunction. 12/06/20   Irine Seal, MD  Vitamin Mixture (ESTER-C PO) Take 1 tablet by mouth daily.    [provider]     Allergies:    Allergies  Allergen Reactions  . Penicillins Hives  . Ranibizumab Rash    Social History:   Social History   Socioeconomic History  . Marital status: Married    Spouse name: Not on file  . Number of children: 3  . Years of education: Not on file  . Highest education level: Not on file  Occupational History  . Occupation: retired  Tobacco Use  . Smoking status: Never Smoker  . Smokeless tobacco: Never Used  Vaping Use  . Vaping Use: Never used  Substance and Sexual Activity  . Alcohol use: No  . Drug use: No  . Sexual activity: Not on file  Other Topics Concern  . Not on file  Social History Narrative  . Not on file   Social Determinants of Health   Financial Resource Strain: Not on file  Food Insecurity: Not on file  Transportation Needs: Not on file  Physical Activity: Not on file  Stress: Not on file  Social Connections: Not on file  Intimate Partner Violence: Not on file    Family History:   The patient's family history is not on file.    ROS:  Please see the  history of present illness.  All other ROS reviewed and negative.     Physical Exam/Data:   Vitals:   12/23/20 1850 12/23/20 1851 12/23/20 1900 12/23/20 2013  BP: (!) 185/86  (!) 170/73   Pulse:  94 91   Resp: (!) 26 19 (!) 28   Temp:      TempSrc:      SpO2:  99% 99% 100%  Weight:      Height:       No intake or output data in the 24 hours ending 12/23/20 2059 Last 3 Weights 12/23/2020 12/06/2020 02/21/2015  Weight (lbs) 185 lb 193 lb 190 lb  Weight (kg) 83.915 kg 87.544 kg 86.183 kg     Body mass index is 24.41 kg/m.  General:  Well nourished, well developed, in no acute distress HEENT: normal Lymph: no adenopathy Neck: no JVD Endocrine:  No thryomegaly Vascular: No carotid bruits; DP pulses 2+ bilaterally   Cardiac:  normal S1, S2; RRR; no murmur  Lungs:  clear to auscultation bilaterally, no wheezing, rhonchi or rales  Abd: soft, nontender, no hepatomegaly  Ext: no edema Musculoskeletal:  No deformities Skin: warm and dry  Neuro:  no focal abnormalities noted Psych:  Normal affect    EKG:  The ECG that was done 12-23-20 was personally reviewed and demonstrates NSR with diffuse ST elevation in the inferior and precordial leads in a pattern c/w early repolarization vs pericarditis  Relevant CV Studies: NA  Laboratory Data:  High Sensitivity Troponin:   Recent Labs  Lab 12/23/20 1834  TROPONINIHS 8      Chemistry Recent Labs  Lab 12/23/20 1834  NA 131*  K 4.2  CL 96*  CO2 25  GLUCOSE 408*  BUN 12  CREATININE 0.86  CALCIUM 9.2  GFRNONAA >60  ANIONGAP 10    No results for input(s): PROT, ALBUMIN, AST, ALT, ALKPHOS, BILITOT in the last 168 hours. Hematology Recent Labs  Lab 12/23/20 1834  WBC 11.0*  RBC 4.25  HGB 13.7  HCT 40.9  MCV 96.2  MCH 32.2  MCHC 33.5  RDW 10.8*  PLT 226   BNPNo results for input(s): BNP, PROBNP in the last 168 hours.  DDimer No results for input(s): DDIMER in the last 168 hours.   Radiology/Studies:  DG Chest Port  1 View  Result Date: 12/23/2020 CLINICAL DATA:  Code STEMI EXAM: PORTABLE CHEST 1 VIEW COMPARISON:  None. FINDINGS: Heart is normal size. No confluent airspace opacities or effusions. No edema. No acute bony abnormality. IMPRESSION: No active disease. Electronically Signed   By: Rolm Baptise M.D.   On: 12/23/2020 19:25     Assessment  and Plan:   1. CP/possible STEMI: urgent LHC to r/o obstructive CAD/ACS. Initial trop negative. EKG and clinical hx sound more c/w something like pericarditis; EKG changes may also be early repolarization. Will get TTE for baseline. TSH, A1c, FLP for screening. 2. HTN/DM2: restart home meds  Risk Assessment/Risk Scores:     TIMI Risk Score for ST  Elevation MI:   The patient's TIMI risk score is 4, which indicates a 7.3% risk of all cause mortality at 30 days.     For questions or updates, please contact [place] Please consult [email or web address] for contact info under     Signed, Rudean Curt, MD, [facility] 12/23/2020 8:59 PM

## 2020-12-23 NOTE — ED Provider Notes (Signed)
7:53 PM Patient seen on arrival to our facility after being transferred from our other hospital. Patient is awake and alert, normotensive, continues to complain of 9/10 pain in the left upper chest.  I have reviewed the rhythm strip from EMS transport.  Notable for diffuse ST elevation, mostly repolarization pattern, but with some depressions in aVR, aVL.  I discussed patient's case with Dr. Claiborne Billings, cardiologist at bedside.  Patient is transferring to the catheterization lab.   Carmin Muskrat, MD 12/23/20 [phone]

## 2020-12-23 NOTE — ED Notes (Signed)
Pt arrives to ED BIB [facility] from [facility] ED as Code STEMI. Pt A/O x4. Cardiologist and EDP at bedside upon pt's arrival. Radiolucent Defibrillator Pads applied.

## 2020-12-23 NOTE — ED Notes (Signed)
[facility] called to transport patient to [facility] cath lab.

## 2020-12-23 NOTE — ED Provider Notes (Signed)
[facility] EMERGENCY DEPARTMENT Provider Note   CSN: [id number] Arrival date & time: 12/23/20  1818     History Chief Complaint  Patient presents with  . Chest Pain    Lawrence [name] is a 75 y.o. male who presents to the emergency department for concern of chest pain that started around 1 PM when he was at church.  He states the pain is sharp, located in his central and left chest and is exacerbated when he leans forward or when he inhales deeply.  He states that the pain has gotten worse throughout the day.  He denies any radiation of the pain, nausea, or vomiting.  He is not diaphoretic at the time of my evaluation.  I personally reviewed this patient's medical records.  He is history of hypertension and type 2 diabetes as well as GERD, no history of coronary artery disease.  There are prior no cardiovascular procedures visible in his chart.  HPI     Past Medical History:  Diagnosis Date  . Arthritis   . Diabetes mellitus without complication ([place])   . GERD (gastroesophageal reflux disease)   . Hypertension   . Sleep apnea     There are no problems to display for this patient.   Past Surgical History:  Procedure Laterality Date  . BACK SURGERY    . COLONOSCOPY N/A 02/21/2015   Procedure: COLONOSCOPY;  Surgeon: Rogene Houston, MD;  [facility];  Service: Endoscopy;  Laterality: N/A;  1025  . NECK SURGERY    . SHOULDER SURGERY         History reviewed. No pertinent family history.  Social History   Tobacco Use  . Smoking status: Never Smoker  . Smokeless tobacco: Never Used  Vaping Use  . Vaping Use: Never used  Substance Use Topics  . Alcohol use: No  . Drug use: No    Home Medications Prior to Admission medications   Medication Sig Start Date End Date Taking? Authorizing Provider  amLODipine (NORVASC) 5 MG tablet Take 1 tablet by mouth daily. 11/09/19  Yes [provider]  aspirin EC 81 MG tablet Take 81 mg by mouth every other day.     [provider]  lisinopril (PRINIVIL,ZESTRIL) 20 MG tablet Take 20 mg by mouth daily.    [provider]  metFORMIN (GLUCOPHAGE) 500 MG tablet Take 500 mg by mouth 2 (two) times daily with a meal.    [provider]  metFORMIN (GLUCOPHAGE-XR) 500 MG 24 hr tablet Take 1,000 mg by mouth 2 (two) times daily. 12/06/20   [provider]  Multiple Vitamins-Minerals (CENTRUM) tablet Take 1 tablet by mouth daily.    [provider]  Multiple Vitamins-Minerals (PRESERVISION AREDS 2 PO) Take 1 tablet by mouth daily.    [provider]  Omega-3 Fatty Acids (FISH OIL PO) Take 1 capsule by mouth daily.    [provider]  tadalafil (CIALIS) 5 MG tablet Take 1 tablet (5 mg total) by mouth daily as needed for erectile dysfunction. 12/06/20   Irine Seal, MD  Vitamin Mixture (ESTER-C PO) Take 1 tablet by mouth daily.    [provider]    Allergies    Penicillins and Ranibizumab  Review of Systems   Review of Systems  Constitutional: Negative.   HENT: Negative.   Respiratory: Positive for chest tightness and shortness of breath.   Cardiovascular: Positive for chest pain. Negative for palpitations and leg swelling.  Gastrointestinal: Negative.  Genitourinary: Negative.   Skin: Negative.   Allergic/Immunologic: Positive for immunocompromised state.       DMT2  Neurological: Negative.   Hematological: Negative.     Physical Exam Updated Vital Signs BP (!) 185/86   Pulse 94   Temp 99.5 F (37.5 C) (Oral)   Resp 19   Ht 6\' 1"  (1.854 m)   Wt 83.9 kg   SpO2 99%   BMI 24.41 kg/m   Physical Exam Vitals and nursing note reviewed.  Constitutional:      Appearance: He is overweight. He is not diaphoretic.  HENT:     Head: Normocephalic and atraumatic.     Nose: Nose normal.     Mouth/Throat:     Mouth: Mucous membranes are moist.     Pharynx: No oropharyngeal exudate or posterior oropharyngeal erythema.  Eyes:      General: Lids are normal. Vision grossly intact.        Right eye: No discharge.        Left eye: No discharge.     Conjunctiva/sclera: Conjunctivae normal.     Pupils: Pupils are equal, round, and reactive to light.  Neck:     Vascular: No JVD.     Trachea: Trachea and phonation normal.  Cardiovascular:     Rate and Rhythm: Normal rate and regular rhythm.     Pulses: Normal pulses.     Heart sounds: Normal heart sounds. No murmur heard.   Pulmonary:     Effort: Pulmonary effort is normal. No respiratory distress.     Breath sounds: Normal breath sounds. No wheezing or rales.     Comments: Pleuritic pain described by patient with inspiration. Chest:     Chest wall: No mass, tenderness or edema.  Abdominal:     General: There is no distension.     Palpations: Abdomen is soft.     Tenderness: There is no abdominal tenderness.  Musculoskeletal:        General: No deformity. Normal range of motion.     Cervical back: Neck supple. No crepitus. No pain with movement or spinous process tenderness.     Right lower leg: No edema.  Skin:    General: Skin is warm and dry.     Capillary Refill: Capillary refill takes less than 2 seconds.  Neurological:     Mental Status: He is alert and oriented to person, place, and time. Mental status is at baseline.  Psychiatric:        Mood and Affect: Mood normal.     ED Results / Procedures / Treatments   Labs (all labs ordered are listed, but only abnormal results are displayed) Labs Reviewed  CBC - Abnormal; Notable for the following components:      Result Value   WBC 11.0 (*)    RDW 10.8 (*)    All other components within normal limits  RESP PANEL BY RT-PCR (FLU A&B, COVID) ARPGX2  BASIC METABOLIC PANEL  HEMOGLOBIN A1C  PROTIME-INR  APTT  LIPID PANEL  TROPONIN I (HIGH SENSITIVITY)    EKG EKG: ST elevation in II, III, aVF concerning for STEMI.   Radiology No results found.  Procedures .Critical Care Performed by:  Emeline Darling, PA-C Authorized by: Emeline Darling, PA-C   Critical care provider statement:    Critical care time (minutes):  45   Critical care was necessary to treat or prevent imminent or life-threatening deterioration of the following conditions: STEMI.   Critical care  was time spent personally by me on the following activities:  Discussions with consultants, evaluation of patient's response to treatment, examination of patient, ordering and performing treatments and interventions, ordering and review of laboratory studies, ordering and review of radiographic studies, pulse oximetry, re-evaluation of patient's condition, obtaining history from patient or surrogate and review of old charts     Medications Ordered in ED Medications  0.9 %  sodium chloride infusion (10 mL/hr Intravenous New Bag/Given 12/23/20 1856)  nitroGLYCERIN (NITROSTAT) SL tablet 0.4 mg (has no administration in time range)  aspirin 325 MG tablet (has no administration in time range)  aspirin 81 MG chewable tablet (has no administration in time range)  aspirin chewable tablet 324 mg (324 mg Oral Given 12/23/20 1859)  heparin injection 4,000 Units (4,000 Units Intravenous Given 12/23/20 1856)    ED Course  I have reviewed the triage vital signs and the nursing notes.  Pertinent labs & imaging results that were available during my care of the patient were reviewed by me and considered in my medical decision making (see chart for details).  Clinical Course as of 12/24/20 1512  Sun Dec 23, 2020  1905 Consult to cardiolgoist STEMI doc, who recommends transfer directly to cath lab at [facility]. Will transport via [facility].  [RS]    Clinical Course User Index [RS] Erynn Vaca, Sharlene Dory   MDM Rules/Calculators/A&P                         75 year old male who presents to the ED for concern of 9/10 left upper chest pain described as sharp, nonradiating that began this afternoon is aggressively  worsened.  Differential diagnosis for this patient's symptoms includes but is not limited to ACS including STEMI, PE, pneumonia, GERD, musculoskeletal pain, coronary or other vascular dissection.  Patient is hypertensive on intake, vital signs otherwise normal.  Cardiopulmonary exam is normal, abdominal exam is benign.  Of note intake EKG revealed diffuse ST elevations in 2, 3, and aVF most notably with some reciprocal depressions in aVR and aVL.  Code STEMI was activated, nitro and ASA were administered, heparin was ordered.  ED attending physician spoke with STEMI doc who recommends transfer immediately to [facility] for left heart catheterization.  [facility] is already present in the emergency department at time of this call.  Patient was loaded into the ambulance for transfer to [facility].  Patient's vital signs remained stable at that time, he was safe for transport to [facility].  This chart was dictated using voice recognition software, Dragon. Despite the best efforts of this provider to proofread and correct errors, errors may still occur which can change documentation meaning.   Final Clinical Impression(s) / ED Diagnoses Final diagnoses:  None    Rx / DC Orders ED Discharge Orders    None       Aura Dials 12/24/20 1520    Sherwood Gambler, MD 12/26/20 [phone]

## 2020-12-23 NOTE — ED Notes (Signed)
[facility] will page cardiology at this time.

## 2020-12-23 NOTE — ED Triage Notes (Signed)
Pt to er, pt states that he is here for chest pain, states that his pain is worse when he bends over and also worse when he takes a deep breath.  Pt states that the pain started around 1pm when he was in church, states that it has gotten worse throughout the day.

## 2020-12-23 NOTE — ED Notes (Signed)
Cardiac pads on patient.

## 2020-12-24 ENCOUNTER — Encounter (HOSPITAL_COMMUNITY): Payer: Self-pay | Admitting: Cardiovascular Disease

## 2020-12-24 ENCOUNTER — Other Ambulatory Visit (HOSPITAL_COMMUNITY): Payer: Self-pay

## 2020-12-24 ENCOUNTER — Inpatient Hospital Stay (HOSPITAL_COMMUNITY): Payer: Medicare HMO

## 2020-12-24 DIAGNOSIS — I309 Acute pericarditis, unspecified: Secondary | ICD-10-CM

## 2020-12-24 DIAGNOSIS — R9431 Abnormal electrocardiogram [ECG] [EKG]: Secondary | ICD-10-CM | POA: Diagnosis not present

## 2020-12-24 LAB — ECHOCARDIOGRAM COMPLETE
AR max vel: 2.73 cm2
AV Area VTI: 2.67 cm2
AV Area mean vel: 2.46 cm2
AV Mean grad: 3 mmHg
AV Peak grad: 4.8 mmHg
Ao pk vel: 1.09 m/s
Area-P 1/2: 3.5 cm2
Calc EF: 67.1 %
Height: 73 in
MV VTI: 2.8 cm2
S' Lateral: 1.8 cm
Single Plane A2C EF: 71.5 %
Single Plane A4C EF: 60.2 %
Weight: 3056 oz

## 2020-12-24 LAB — GLUCOSE, CAPILLARY
Glucose-Capillary: 321 mg/dL — ABNORMAL HIGH (ref 70–99)
Glucose-Capillary: 199 mg/dL — ABNORMAL HIGH (ref 70–99)

## 2020-12-24 LAB — LIPID PANEL
Cholesterol: 130 mg/dL (ref 0–200)
HDL: 55 mg/dL (ref >40)
LDL Cholesterol: 61 mg/dL (ref 0–99)
Total CHOL/HDL Ratio: 2.4 RATIO
Triglycerides: 72 mg/dL (ref <150)
VLDL: 14 mg/dL (ref 0–40)

## 2020-12-24 LAB — CBC
HCT: 34.9 % — ABNORMAL LOW (ref 39.0–52.0)
Hemoglobin: 11.8 g/dL — ABNORMAL LOW (ref 13.0–17.0)
MCH: 32.3 pg (ref 26.0–34.0)
MCHC: 33.8 g/dL (ref 30.0–36.0)
MCV: 95.6 fL (ref 80.0–100.0)
Platelets: 177 10*3/uL (ref 150–400)
RBC: 3.65 MIL/uL — ABNORMAL LOW (ref 4.22–5.81)
RDW: 10.8 % — ABNORMAL LOW (ref 11.5–15.5)
WBC: 7 10*3/uL (ref 4.0–10.5)
nRBC: 0 % (ref 0.0–0.2)

## 2020-12-24 LAB — HEMOGLOBIN A1C
Hgb A1c MFr Bld: 10.4 % — ABNORMAL HIGH (ref 4.8–5.6)
Mean Plasma Glucose: 251.78 mg/dL

## 2020-12-24 LAB — SEDIMENTATION RATE: Sed Rate: 10 mm/hr (ref 0–16)

## 2020-12-24 MED ORDER — IBUPROFEN 600 MG PO TABS
600.0000 mg | ORAL_TABLET | Freq: Three times a day (TID) | ORAL | Status: DC
  Administered 2020-12-24 – 2020-12-25 (×4): 600 mg via ORAL
  Filled 2020-12-24 (×4): qty 1

## 2020-12-24 MED ORDER — PANTOPRAZOLE SODIUM 40 MG PO TBEC
40.0000 mg | DELAYED_RELEASE_TABLET | Freq: Every day | ORAL | Status: DC
  Administered 2020-12-24 – 2020-12-25 (×2): 40 mg via ORAL
  Filled 2020-12-24 (×2): qty 1

## 2020-12-24 MED ORDER — LIVING WELL WITH DIABETES BOOK
Freq: Once | Status: AC
  Filled 2020-12-24: qty 1

## 2020-12-24 MED ORDER — INSULIN ASPART 100 UNIT/ML ~~LOC~~ SOLN
0.0000 [IU] | Freq: Three times a day (TID) | SUBCUTANEOUS | Status: DC
  Administered 2020-12-24: 11 [IU] via SUBCUTANEOUS
  Administered 2020-12-25: 15 [IU] via SUBCUTANEOUS
  Administered 2020-12-25: 3 [IU] via SUBCUTANEOUS

## 2020-12-24 MED ORDER — INSULIN ASPART 100 UNIT/ML ~~LOC~~ SOLN: 0.0000 [IU] | Freq: Every day | SUBCUTANEOUS | Status: DC

## 2020-12-24 MED ORDER — INSULIN STARTER KIT- PEN NEEDLES (ENGLISH)
1.0000 | Freq: Once | Status: AC
  Administered 2020-12-24: 1
  Filled 2020-12-24: qty 1

## 2020-12-24 MED ORDER — COLCHICINE 0.6 MG PO TABS
0.6000 mg | ORAL_TABLET | Freq: Two times a day (BID) | ORAL | Status: DC
  Administered 2020-12-24 – 2020-12-25 (×3): 0.6 mg via ORAL
  Filled 2020-12-24 (×3): qty 1

## 2020-12-24 MED FILL — Heparin Sod (Porcine)-NaCl IV Soln 1000 Unit/500ML-0.9%: INTRAVENOUS | Qty: 1500 | Status: AC

## 2020-12-24 NOTE — Progress Notes (Addendum)
Inpatient Diabetes Program Recommendations  AACE/ADA: New Consensus Statement on Inpatient Glycemic Control (2015)  Target Ranges:  Prepandial:   less than 140 mg/dL      Peak postprandial:   less than 180 mg/dL (1-2 hours)      Critically ill patients:  140 - 180 mg/dL   Lab Results  Component Value Date   HGBA1C 10.4 (H) 12/23/2020    Review of Glycemic Control Results for Lawrence [name], Lawrence [name] (MRN [id number]) as of 12/24/2020 10:21  Ref. Range 12/23/2020 18:34  Glucose Latest Ref Range: 70 - 99 mg/dL 408 (H)  Results for Lawrence [name], Lawrence [name] (MRN [id number]) as of 12/24/2020 10:21  Ref. Range 12/23/2020 18:53  Hemoglobin A1C Latest Ref Range: 4.8 - 5.6 % 10.4 (H)   Diabetes history: DM 2 Outpatient Diabetes medications: Metformin 500 mg bid Current orders for Inpatient glycemic control: none  Inpatient Diabetes Program Recommendations:    -  Order Levemir 10 units -  Order Novolog 0-15 units tid + hs  Based on A1c may benefit to basal insulin and an oral agent at time of d/c. Levemir preferred by insurance.  Addendum 1235 pm: When speaking with pt at bedside he just had a PCP appointment 2 weeks ago and was prescribed steroids to get the swelling down in his legs. Pt reports having a physical and his A1c was 7.9% 2 weeks ago. Pt has an accucheck glucose meter but needs more strips and lancets for it. Pt goes to Dr. Stana Bunting at [facility]. Pt checks his glucose every pother day. Suggested to patient to check glucose at least 1-2 times a day until glucose trends come down from steroid use. Agree with SGLT-2 medication at time of d/c instead of starting insulin at this time. Pt knows to follow up with his PCP for glucose control.  At time of d/c please order Accucheck test strips order # [id number] (enough for 2 times a day) Lancets order # [id number] SGLT-2  Thanks,  Tama Headings RN, MSN, BC-ADM Inpatient Diabetes Coordinator Team Pager [phone] (8a-5p)

## 2020-12-24 NOTE — Progress Notes (Addendum)
Progress Note  Patient Name: Lawrence [name] Date of Encounter: 12/24/2020  [facility] Cardiologist: No primary care provider on file.   Subjective   Chest pain inspiration. Otherwise feeling ok.   Inpatient Medications    Scheduled Meds: . aspirin  81 mg Oral Daily  . sodium chloride flush  3 mL Intravenous Q12H   Continuous Infusions: . sodium chloride 10 mL/hr (12/23/20 1856)  . sodium chloride     PRN Meds: sodium chloride, acetaminophen, diazepam, ondansetron (ZOFRAN) IV, sodium chloride flush   Vital Signs    Vitals:   12/24/20 0545 12/24/20 0600 12/24/20 0615 12/24/20 0718  BP:    (!) 141/60  Pulse: 76 73 73 81  Resp: 18 14 13 20   Temp:    98.3 F (36.8 C)  TempSrc:    Oral  SpO2: 98% 97% 98% 97%  Weight:      Height:        Intake/Output Summary (Last 24 hours) at 12/24/2020 1016 Last data filed at 12/24/2020 5427 Gross per 24 hour  Intake 120 ml  Output 700 ml  Net -580 ml   Last 3 Weights 12/24/2020 12/23/2020 12/06/2020  Weight (lbs) 191 lb 185 lb 193 lb  Weight (kg) 86.637 kg 83.915 kg 87.544 kg      Telemetry    NSR with continued ST elevation - Personally Reviewed  ECG    No new tracing this morning  Physical Exam   GEN: No acute distress.   Neck: No JVD Cardiac: RRR, no murmurs, rubs, or gallops.  Respiratory: Clear to auscultation bilaterally. GI: Soft, nontender, non-distended  MS: No edema; No deformity. Right radial cath site stable. Neuro:  Nonfocal  Psych: Normal affect   Labs    High Sensitivity Troponin:   Recent Labs  Lab 12/23/20 1834 12/23/20 1959  TROPONINIHS 8 7      Chemistry Recent Labs  Lab 12/23/20 1834  NA 131*  K 4.2  CL 96*  CO2 25  GLUCOSE 408*  BUN 12  CREATININE 0.86  CALCIUM 9.2  GFRNONAA >60  ANIONGAP 10     Hematology Recent Labs  Lab 12/23/20 1834 12/24/20 0629  WBC 11.0* 7.0  RBC 4.25 3.65*  HGB 13.7 11.8*  HCT 40.9 34.9*  MCV 96.2 95.6  MCH 32.2 32.3  MCHC 33.5 33.8  RDW  10.8* 10.8*  PLT 226 177    BNPNo results for input(s): BNP, PROBNP in the last 168 hours.   DDimer No results for input(s): DDIMER in the last 168 hours.   Radiology    CARDIAC CATHETERIZATION  Result Date: 12/23/2020 Normal epicardial coronary arteries with a dominant right coronary system. Normal LV contractility with EF estimated at 60% without focal segmental wall motion abnormalities; LVEDP 16 mmHg. RECOMMENDATION: The patient ECG was suggestive of possible early repolarization versus pericardial chest pain.  Epicardial coronary arteries are normal.  2D echo Doppler study will be scheduled.  His chest tightness has resolved but he does have some residual pleuritic-like chest discomfort.  Will check sedimentation rate.  Obtain serial ECG.  Consider colchicine if pericarditis.  DG Chest Port 1 View  Result Date: 12/23/2020 CLINICAL DATA:  Code STEMI EXAM: PORTABLE CHEST 1 VIEW COMPARISON:  None. FINDINGS: Heart is normal size. No confluent airspace opacities or effusions. No edema. No acute bony abnormality. IMPRESSION: No active disease. Electronically Signed   By: Rolm Baptise M.D.   On: 12/23/2020 19:25    Cardiac Studies   Cath: 12/23/20  Normal epicardial coronary arteries with a dominant right coronary system.  Normal LV contractility with EF estimated at 60% without focal segmental wall motion abnormalities; LVEDP 16 mmHg.  RECOMMENDATION: The patient ECG was suggestive of possible early repolarization versus pericardial chest pain.  Epicardial coronary arteries are normal.  2D echo Doppler study will be scheduled.  His chest tightness has resolved but he does have some residual pleuritic-like chest discomfort.  Will check sedimentation rate.  Obtain serial ECG.  Consider colchicine if pericarditis.  Echo: pending  Patient Profile     75 y.o. male with no prior past cardiac hx but w/ h/o DM2, HTN, OSA presents to [facility] c/o CP; transferred for possible STEMI.  Assessment &  Plan    1. Chest pain with abnormal EKG: Initially concerning for STEMI. Underwent cardiac cath with normal coronaries. hsTn 8>>7. In talking with patient symptoms seem more consistent with pericarditis, worse with deep breath. Sed rate 10.  -- will start colchicine 0.6mg  BID and ibuprofen 600mg  TID along with protonix -- echo pending -- unclear the utility of cardiac MRI, will review further with MD  2. DM: Hgb 10.4 -- reports his PCP recently increased his metformin to 1000mg  BID -- needs tight glycemic control -- may be good to consider SGLT2  3. HTN: had been on lisinopril but stopped 2/2 LE edema by PCP -- will await echo results to determine further BP meds  For questions or updates, please contact [place] Please consult [email or web address] for contact info under        Signed, Reino Bellis, NP  12/24/2020, 10:16 AM    I have examined the patient and reviewed assessment and plan and discussed with patient.  Agree with above as stated.    Treat for pericarditis as noted above.  Negative troponin so no evidence of myocarditis.  Will check with imager if cMRI would be helpful.  I personally reviewed echo images while the study was being done and wall motion appeared normal.   Larae Grooms

## 2020-12-24 NOTE — TOC Benefit Eligibility Note (Signed)
Patient Teacher, English as a foreign language completed.    The patient is currently admitted and upon discharge could be taking Colchicine 0.6 mg tablets.  The current 30 day co-pay is, $47.00.   The patient is currently admitted and upon discharge could be taking Jardiance 10 mg tablets.  The current 30 day co-pay is, $47.00.   The patient is currently admitted and upon discharge could be taking Farxiga 10 mg tablets.  The current 30 day co-pay is, $47.00.   The patient is insured through [place] Patient Advocate Specialist [place] Team Direct Number: [phone]  Fax: [phone]

## 2020-12-25 ENCOUNTER — Other Ambulatory Visit (HOSPITAL_COMMUNITY): Payer: Self-pay

## 2020-12-25 DIAGNOSIS — R9431 Abnormal electrocardiogram [ECG] [EKG]: Secondary | ICD-10-CM | POA: Diagnosis not present

## 2020-12-25 DIAGNOSIS — I309 Acute pericarditis, unspecified: Secondary | ICD-10-CM | POA: Diagnosis not present

## 2020-12-25 DIAGNOSIS — E118 Type 2 diabetes mellitus with unspecified complications: Secondary | ICD-10-CM

## 2020-12-25 LAB — GLUCOSE, CAPILLARY
Glucose-Capillary: 194 mg/dL — ABNORMAL HIGH (ref 70–99)
Glucose-Capillary: 354 mg/dL — ABNORMAL HIGH (ref 70–99)

## 2020-12-25 MED ORDER — METFORMIN HCL ER 500 MG PO TB24: 1000.0000 mg | ORAL_TABLET | Freq: Two times a day (BID) | ORAL | Status: DC

## 2020-12-25 MED ORDER — IBUPROFEN 400 MG PO TABS
400.0000 mg | ORAL_TABLET | Freq: Three times a day (TID) | ORAL | 0 refills | Status: AC
  Filled 2020-12-25: qty 21, 7d supply, fill #0

## 2020-12-25 MED ORDER — PANTOPRAZOLE SODIUM 40 MG PO TBEC
40.0000 mg | DELAYED_RELEASE_TABLET | Freq: Every day | ORAL | 0 refills | Status: DC
  Filled 2020-12-25: qty 7, 7d supply, fill #0

## 2020-12-25 MED ORDER — COLCHICINE 0.6 MG PO TABS
0.6000 mg | ORAL_TABLET | Freq: Two times a day (BID) | ORAL | 0 refills | Status: DC
  Filled 2020-12-25: qty 60, 30d supply, fill #0

## 2020-12-25 NOTE — Plan of Care (Signed)

## 2020-12-25 NOTE — Discharge Summary (Addendum)
Discharge Summary    Patient ID: Lawrence [name] MRN: [id number]; DOB: Jun 06, 1946  Admit date: 12/23/2020 Discharge date: 12/25/2020  PCP:  [facility], [facility]  Cardiologist:  Shelva Majestic, MD  Advanced Practice Provider:  No care team member to display Electrophysiologist:  None   Discharge Diagnoses    Active Problems:   Chest pain   Abnormal ECG   Precordial chest pain    Diagnostic Studies/Procedures    Cath: 12/23/20  Normal epicardial coronary arteries with a dominant right coronary system.  Normal LV contractility with EF estimated at 60% without focal segmental wall motion abnormalities; LVEDP 16 mmHg.  RECOMMENDATION: The patient ECG was suggestive of possible early repolarization versus pericardial chest pain. Epicardial coronary arteries are normal. 2D echo Doppler study will be scheduled. His chest tightness has resolved but he does have some residual pleuritic-like chest discomfort. Will check sedimentation rate. Obtain serial ECG. Consider colchicine if pericarditis.  Echo: 12/24/20  IMPRESSIONS    1. Left ventricular ejection fraction, by estimation, is 60 to 65%. The  left ventricle has normal function. The left ventricle has no regional  wall motion abnormalities. There is moderate asymmetric left ventricular  hypertrophy of the basal and septal  segments. Left ventricular diastolic parameters were normal.  2. Right ventricular systolic function is normal. The right ventricular  size is normal. There is normal pulmonary artery systolic pressure.  3. The pericardial effusion is anterior to the right ventricle.  4. The mitral valve is normal in structure. No evidence of mitral valve  regurgitation. No evidence of mitral stenosis.  5. The aortic valve is tricuspid. There is mild calcification of the  aortic valve. Aortic valve regurgitation is mild. Mild aortic valve  sclerosis is present, with no  evidence of aortic valve stenosis.  6. The inferior vena cava is normal in size with greater than 50%  respiratory variability, suggesting right atrial pressure of 3 mmHg.  _____________   History of Present Illness     Lawrence [name] is a 75 y.o. male with no prior past cardiac hx but w/ h/o DM2, HTN, OSA presents to [facility] c/o CP. Pt stated pain started around 1pm earlier the day of admission while he was in church and has gradually increased in intensity throughout the day. It was a sharp pain, 9/10, in the center of his chest. He stated it is worse when he bends over or takes a deep breath. Initial HS trop 8. EKG at [facility] showed ST elevation that appeared more c/w early repolarization pattern or pericarditis than acute MI, but pt transferred for urgent LHC for possible STEMI.   Hospital Course     1. Chest pain with abnormal EKG: Initially concerning for STEMI. Underwent cardiac cath with normal coronaries. hsTn 8>>7. In talking with patient symptoms seemed more consistent with pericarditis, worse with deep breath. Sed rate 10.  -- he was started on colchicine 0.6mg  BID and ibuprofen 600mg  TID along with protonix with significant improvement. ST elevation in leads improved on telemetry as well -- will plan for colchicine 0.6mg  BID for 3 months, along with ibuprofen 400mg  TID for one week  2. DM: Hgb 10.4 -- reports his PCP recently increased his metformin to 1000mg  BID. Had also been a short course of steroids for LE edema.  -- needs tight glycemic control -- he has close follow up with his PCP, and recommended that he discuss adding SGLT2 to regimen at follow up, which he  is agreeable to do  3. HTN: had been on lisinopril but stopped 2/2 LE edema by PCP? -- reports his blood pressures have been liable at home, down in the 95-63 range systolic. Monitors BP daily. I will defer further management to his PCP at follow up appt  General: Well developed, well nourished, male appearing in no acute  distress. Head: Normocephalic, atraumatic.  Neck: Supple without bruits, JVD. Lungs:  Resp regular and unlabored, CTA. Heart: RRR, S1, S2, no S3, S4, or murmur; no rub. Abdomen: Soft, non-tender, non-distended with normoactive bowel sounds. No hepatomegaly. No rebound/guarding. No obvious abdominal masses. Extremities: No clubbing, cyanosis, edema. Distal pedal pulses are 2+ bilaterally. Right radial cath site stable without bruising or hematoma Neuro: Alert and oriented X 3. Moves all extremities spontaneously. Psych: Normal affect.  Patient was seen by Dr. Irish Lack and deemed stable for discharge home. Follow up in the office has been arranged. Medications were sent to the [facility] pharmacy  Did the patient have an acute coronary syndrome (MI, NSTEMI, STEMI, etc) this admission?:  No                               Did the patient have a percutaneous coronary intervention (stent / angioplasty)?:  No.    _____________  Discharge Vitals Blood pressure (!) 164/84, pulse 72, temperature 98 F (36.7 C), temperature source Oral, resp. rate 18, height 6\' 1"  (1.854 m), weight 85.4 kg, SpO2 100 %.  Filed Weights   12/23/20 1832 12/24/20 0500 12/25/20 0400  Weight: 83.9 kg 86.6 kg 85.4 kg    Labs & Radiologic Studies    CBC Recent Labs    12/23/20 1834 12/24/20 0629  WBC 11.0* 7.0  HGB 13.7 11.8*  HCT 40.9 34.9*  MCV 96.2 95.6  PLT 226 875   Basic Metabolic Panel Recent Labs    12/23/20 1834  NA 131*  K 4.2  CL 96*  CO2 25  GLUCOSE 408*  BUN 12  CREATININE 0.86  CALCIUM 9.2   Liver Function Tests No results for input(s): AST, ALT, ALKPHOS, BILITOT, PROT, ALBUMIN in the last 72 hours. No results for input(s): LIPASE, AMYLASE in the last 72 hours. High Sensitivity Troponin:   Recent Labs  Lab 12/23/20 1834 12/23/20 1959  TROPONINIHS 8 7    BNP Invalid input(s): POCBNP D-Dimer No results for input(s): DDIMER in the last 72 hours. Hemoglobin A1C Recent Labs     12/23/20 1853  HGBA1C 10.4*   Fasting Lipid Panel Recent Labs    12/24/20 0629  CHOL 130  HDL 55  LDLCALC 61  TRIG 72  CHOLHDL 2.4   Thyroid Function Tests No results for input(s): TSH, T4TOTAL, T3FREE, THYROIDAB in the last 72 hours.  Invalid input(s): FREET3 _____________  CARDIAC CATHETERIZATION  Result Date: 12/23/2020 Normal epicardial coronary arteries with a dominant right coronary system. Normal LV contractility with EF estimated at 60% without focal segmental wall motion abnormalities; LVEDP 16 mmHg. RECOMMENDATION: The patient ECG was suggestive of possible early repolarization versus pericardial chest pain.  Epicardial coronary arteries are normal.  2D echo Doppler study will be scheduled.  His chest tightness has resolved but he does have some residual pleuritic-like chest discomfort.  Will check sedimentation rate.  Obtain serial ECG.  Consider colchicine if pericarditis.  US Venous Img Lower Bilateral (DVT)  Result Date: 12/06/2020 CLINICAL DATA:  Bilateral lower extremity edema for the past 2 years. Evaluate  for DVT. EXAM: BILATERAL LOWER EXTREMITY VENOUS DOPPLER ULTRASOUND TECHNIQUE: Gray-scale sonography with graded compression, as well as color Doppler and duplex ultrasound were performed to evaluate the lower extremity deep venous systems from the level of the common femoral vein and including the common femoral, femoral, profunda femoral, popliteal and calf veins including the posterior tibial, peroneal and gastrocnemius veins when visible. The superficial great saphenous vein was also interrogated. Spectral Doppler was utilized to evaluate flow at rest and with distal augmentation maneuvers in the common femoral, femoral and popliteal veins. COMPARISON:  None. FINDINGS: RIGHT LOWER EXTREMITY Common Femoral Vein: No evidence of thrombus. Normal compressibility, respiratory phasicity and response to augmentation. Saphenofemoral Junction: No evidence of thrombus. Normal  compressibility and flow on color Doppler imaging. Profunda Femoral Vein: No evidence of thrombus. Normal compressibility and flow on color Doppler imaging. Femoral Vein: No evidence of thrombus. Normal compressibility, respiratory phasicity and response to augmentation. Popliteal Vein: No evidence of thrombus. Normal compressibility, respiratory phasicity and response to augmentation. Calf Veins: No evidence of thrombus. Normal compressibility and flow on color Doppler imaging. Superficial Great Saphenous Vein: No evidence of thrombus. Normal compressibility. Venous Reflux:  None. Other Findings:  None. LEFT LOWER EXTREMITY Common Femoral Vein: No evidence of thrombus. Normal compressibility, respiratory phasicity and response to augmentation. Saphenofemoral Junction: No evidence of thrombus. Normal compressibility and flow on color Doppler imaging. Profunda Femoral Vein: No evidence of thrombus. Normal compressibility and flow on color Doppler imaging. Femoral Vein: No evidence of thrombus. Normal compressibility, respiratory phasicity and response to augmentation. Popliteal Vein: No evidence of thrombus. Normal compressibility, respiratory phasicity and response to augmentation. Calf Veins: No evidence of thrombus. Normal compressibility and flow on color Doppler imaging. Superficial Great Saphenous Vein: No evidence of thrombus. Normal compressibility. Venous Reflux:  None. Other Findings:  None. IMPRESSION: No evidence of DVT within either lower extremity. Electronically Signed   By: Sandi Mariscal M.D.   On: 12/06/2020 13:24   DG Chest Port 1 View  Result Date: 12/23/2020 CLINICAL DATA:  Code STEMI EXAM: PORTABLE CHEST 1 VIEW COMPARISON:  None. FINDINGS: Heart is normal size. No confluent airspace opacities or effusions. No edema. No acute bony abnormality. IMPRESSION: No active disease. Electronically Signed   By: Rolm Baptise M.D.   On: 12/23/2020 19:25   ECHOCARDIOGRAM COMPLETE  Result Date: 12/24/2020     ECHOCARDIOGRAM REPORT   Patient Name:   Lawrence [name] Date of Exam: 12/24/2020 Medical Rec #:  [id number]     Height:       73.0 in Accession #:    [id number]    Weight:       191.0 lb Date of Birth:  1945-10-18     BSA:          2.110 m Patient Age:    75 years      BP:           141/60 mmHg Patient Gender: M             HR:           79 bpm. Exam Location:  Inpatient Procedure: 2D Echo, Cardiac Doppler and Color Doppler Indications:    Abnormal EKG  History:        Patient has no prior history of Echocardiogram examinations.                 Risk Factors:Hypertension and Diabetes.  Sonographer:    Luisa Hart RDCS Referring Phys: [place]  1. Left ventricular ejection fraction, by estimation, is 60 to 65%. The left ventricle has normal function. The left ventricle has no regional wall motion abnormalities. There is moderate asymmetric left ventricular hypertrophy of the basal and septal segments. Left ventricular diastolic parameters were normal.  2. Right ventricular systolic function is normal. The right ventricular size is normal. There is normal pulmonary artery systolic pressure.  3. The pericardial effusion is anterior to the right ventricle.  4. The mitral valve is normal in structure. No evidence of mitral valve regurgitation. No evidence of mitral stenosis.  5. The aortic valve is tricuspid. There is mild calcification of the aortic valve. Aortic valve regurgitation is mild. Mild aortic valve sclerosis is present, with no evidence of aortic valve stenosis.  6. The inferior vena cava is normal in size with greater than 50% respiratory variability, suggesting right atrial pressure of 3 mmHg. FINDINGS  Left Ventricle: Left ventricular ejection fraction, by estimation, is 60 to 65%. The left ventricle has normal function. The left ventricle has no regional wall motion abnormalities. The left ventricular internal cavity size was normal in size. There is  moderate asymmetric left  ventricular hypertrophy of the basal and septal segments. Left ventricular diastolic parameters were normal. Right Ventricle: The right ventricular size is normal. No increase in right ventricular wall thickness. Right ventricular systolic function is normal. There is normal pulmonary artery systolic pressure. The tricuspid regurgitant velocity is 2.36 m/s, and  with an assumed right atrial pressure of 3 mmHg, the estimated right ventricular systolic pressure is 93.7 mmHg. Left Atrium: Left atrial size was normal in size. Right Atrium: Right atrial size was normal in size. Pericardium: Trivial pericardial effusion is present. The pericardial effusion is anterior to the right ventricle. Mitral Valve: The mitral valve is normal in structure. No evidence of mitral valve regurgitation. No evidence of mitral valve stenosis. MV peak gradient, 3.0 mmHg. The mean mitral valve gradient is 2.0 mmHg. Tricuspid Valve: The tricuspid valve is normal in structure. Tricuspid valve regurgitation is mild . No evidence of tricuspid stenosis. Aortic Valve: The aortic valve is tricuspid. There is mild calcification of the aortic valve. Aortic valve regurgitation is mild. Mild aortic valve sclerosis is present, with no evidence of aortic valve stenosis. Aortic valve mean gradient measures 3.0 mmHg. Aortic valve peak gradient measures 4.8 mmHg. Aortic valve area, by VTI measures 2.67 cm. Pulmonic Valve: The pulmonic valve was normal in structure. Pulmonic valve regurgitation is not visualized. No evidence of pulmonic stenosis. Aorta: The aortic root is normal in size and structure. Venous: The inferior vena cava is normal in size with greater than 50% respiratory variability, suggesting right atrial pressure of 3 mmHg. IAS/Shunts: No atrial level shunt detected by color flow Doppler.  LEFT VENTRICLE PLAX 2D LVIDd:         3.40 cm     Diastology LVIDs:         1.80 cm     LV e' medial:    4.46 cm/s LV PW:         1.30 cm     LV E/e'  medial:  17.1 LV IVS:        1.60 cm     LV e' lateral:   6.31 cm/s LVOT diam:     2.20 cm     LV E/e' lateral: 12.1 LV SV:         63 LV SV Index:   30 LVOT Area:     3.80  cm  LV Volumes (MOD) LV vol d, MOD A2C: 43.8 ml LV vol d, MOD A4C: 77.6 ml LV vol s, MOD A2C: 12.5 ml LV vol s, MOD A4C: 30.9 ml LV SV MOD A2C:     31.3 ml LV SV MOD A4C:     77.6 ml LV SV MOD BP:      39.7 ml RIGHT VENTRICLE RV S prime:     12.70 cm/s  PULMONARY VEINS TAPSE (M-mode): 2.0 cm      A Reversal Duration: 95.00 msec                             A Reversal Velocity: 20.00 cm/s                             Diastolic Velocity:  47.65 cm/s                             S/D Velocity:        1.60                             Systolic Velocity:   46.50 cm/s LEFT ATRIUM           Index       RIGHT ATRIUM           Index LA Vol (A2C): 17.7 ml 8.39 ml/m  RA Area:     12.50 cm LA Vol (A4C): 61.4 ml 29.10 ml/m RA Volume:   27.70 ml  13.13 ml/m  AORTIC VALVE                   PULMONIC VALVE AV Area (Vmax):    2.73 cm    PV Vmax:       0.82 m/s AV Area (Vmean):   2.46 cm    PV Vmean:      69.800 cm/s AV Area (VTI):     2.67 cm    PV VTI:        0.217 m AV Vmax:           109.00 cm/s PV Peak grad:  2.7 mmHg AV Vmean:          89.300 cm/s PV Mean grad:  2.0 mmHg AV VTI:            0.236 m AV Peak Grad:      4.8 mmHg AV Mean Grad:      3.0 mmHg LVOT Vmax:         78.30 cm/s LVOT Vmean:        57.700 cm/s LVOT VTI:          0.166 m LVOT/AV VTI ratio: 0.70  AORTA Ao Root diam: 3.60 cm Ao Asc diam:  3.30 cm MITRAL VALVE               TRICUSPID VALVE MV Area (PHT): 3.50 cm    TR Peak grad:   22.3 mmHg MV Area VTI:   2.80 cm    TR Vmax:        236.00 cm/s MV Peak grad:  3.0 mmHg MV Mean grad:  2.0 mmHg    SHUNTS MV Vmax:       0.86 m/s    Systemic VTI:  0.17 m MV Vmean:  60.6 cm/s   Systemic Diam: 2.20 cm MV Decel Time: 217 msec MV E velocity: 76.40 cm/s MV A velocity: 84.80 cm/s MV E/A ratio:  0.90 Jenkins Rouge MD Electronically signed by Jenkins Rouge MD Signature Date/Time: 12/24/2020/11:52:19 AM    Final    Disposition   Pt is being discharged home today in good condition.  Follow-up Plans & Appointments     Follow-up Information    Verta Ellen., NP Follow up on 01/04/2021.   Specialty: Cardiology Why: at 3pm for your follow up appt Contact information: [place] [address] [phone]              Discharge Instructions    Call MD for:  redness, tenderness, or signs of infection (pain, swelling, redness, odor or green/yellow discharge around incision site)   Complete by: As directed    Diet - low sodium heart healthy   Complete by: As directed    Discharge instructions   Complete by: As directed    Radial Site Care Refer to this sheet in the next few weeks. These instructions provide you with information on caring for yourself after your procedure. Your caregiver may also give you more specific instructions. Your treatment has been planned according to current medical practices, but problems sometimes occur. Call your caregiver if you have any problems or questions after your procedure. HOME CARE INSTRUCTIONS You may shower the day after the procedure.Remove the bandage (dressing) and gently wash the site with plain soap and water.Gently pat the site dry.  Do not apply powder or lotion to the site.  Do not submerge the affected site in water for 3 to 5 days.  Inspect the site at least twice daily.  Do not flex or bend the affected arm for 24 hours.  No lifting over 5 pounds (2.3 kg) for 5 days after your procedure.  Do not drive home if you are discharged the same day of the procedure. Have someone else drive you.  You may drive 24 hours after the procedure unless otherwise instructed by your caregiver.  What to expect: Any bruising will usually fade within 1 to 2 weeks.  Blood that collects in the tissue (hematoma) may be painful to the touch. It should usually decrease in  size and tenderness within 1 to 2 weeks.  SEEK IMMEDIATE MEDICAL CARE IF: You have unusual pain at the radial site.  You have redness, warmth, swelling, or pain at the radial site.  You have drainage (other than a small amount of blood on the dressing).  You have chills.  You have a fever or persistent symptoms for more than 72 hours.  You have a fever and your symptoms suddenly get worse.  Your arm becomes pale, cool, tingly, or numb.  You have heavy bleeding from the site. Hold pressure on the site.   Increase activity slowly   Complete by: As directed       Discharge Medications   Allergies as of 12/25/2020      Reactions   Penicillins Hives   Ranibizumab Rash      Medication List    TAKE these medications   aspirin EC 81 MG tablet Take 81 mg by mouth every other day.   colchicine 0.6 MG tablet Take 1 tablet (0.6 mg total) by mouth 2 (two) times daily.   ESTER-C PO Take 1 tablet by mouth daily.   Fish Oil 1000 MG Caps Take 1,000 mg  by mouth daily.   ibuprofen 400 MG tablet Commonly known as: ADVIL Take 1 tablet (400 mg total) by mouth 3 (three) times daily for 7 days.   metFORMIN 500 MG 24 hr tablet Commonly known as: GLUCOPHAGE-XR Take 2 tablets (1,000 mg total) by mouth 2 (two) times daily. Start taking on: December 26, 2020   OVER THE COUNTER MEDICATION Take 1 tablet by mouth daily. Super Beta prostate formula   pantoprazole 40 MG tablet Commonly known as: PROTONIX Take 1 tablet (40 mg total) by mouth daily. Start taking on: December 26, 2020   PRESERVISION AREDS 2 PO Take 1 tablet by mouth daily.   Centrum tablet Take 1 tablet by mouth daily.   tadalafil 5 MG tablet Commonly known as: CIALIS Take 1 tablet (5 mg total) by mouth daily as needed for erectile dysfunction.       Outstanding Labs/Studies   N/a   Duration of Discharge Encounter   Greater than 30 minutes including physician time.  Signed, Reino Bellis, NP 12/25/2020, 12:27 PM  I  have examined the patient and reviewed assessment and plan and discussed with patient.  Agree with above as stated.    GEN: Well nourished, well developed, in no acute distress  HEENT: normal  Neck: no JVD, carotid bruits, or masses Cardiac: RRR; no murmurs, rubs, or gallops, mild lower extremity edema  Respiratory:  clear to auscultation bilaterally, normal work of breathing GI: soft, nontender, nondistended,  MS: no deformity or atrophy , no right radial hematoma Skin: warm and dry, no rash Neuro:  Strength and sensation are intact Psych: euthymic mood, full affect   Chest pain is resolved.  Will treat with lower dose of ibuprofen.  Will treat with colchicine as well.  I stressed the importance of taking his medicines with food.  It is also important that he takes the medicines even though the chest pain is gone to help reduce the likelihood of recurrence.   Continue aggressive management of diabetes.  Avoid processed foods.  Low-salt diet.  Elevate legs.  Use compression stockings for lower extremity edema.  Plan for follow-up in the [place] office if possible.    Larae Grooms

## 2020-12-25 NOTE — Progress Notes (Signed)
D/C instruction given and reviewed. Tele and IV's removed, tolerated well. Pt stated he wanted to take shower prior to D/C.

## 2021-01-03 NOTE — Progress Notes (Signed)
Cardiology Office Note  Date: 01/04/2021   ID: Lawrence [name], DOB 1946-06-01, MRN [id number]  PCP:  [facility]  Cardiologist:  Shelva Majestic, MD Electrophysiologist:  None   Chief Complaint: Hospital follow up  History of Present Illness: Lawrence [name] is a 75 y.o. male with a history of HTN, OSA, DM2, GERD.  Recent present to [facility] 12/23/2020 with complaints of chest pain. It started earlier in the day at church and progressively increased in intensity later in the day. Severity 9/10 mid-sternal aggravated by bending over and taking deep breaths. Trop initially 8. EKG with ST elevation appearing more consistent with early repolarization or pericarditis. He was transferred to [facility] for urgent cardiac catheterization.  Cardiac catheterization revealed normal coronary arteries. He was eventually treated for pericarditis with Colchicine 0.6 mg bid x 3 months with ibuprofen 400 mg po TID for 1 week. His HgbA1C was 10.4. He was taking increased doses of metformin 1000 mg po bid. SGLT2 Inhibitor was recommended for addition to current regimen at follow up. He had been on Lisinopril but PCP had stopped due to LE edema. His home BP had been low at home with SBP 80-90s. Management was deferred to PCP.  He is here today for follow-up and states he is feeling much better since he stopped taking his ibuprofen and ran out of his colchicine.  Denies any chest pain, shortness of breath, dizziness, sweating, palpitations.  States he has no pain when bending over as he did with initial presentation.  He is completed 30 days of the colchicine.  He is no longer taking the ibuprofen.  Denies any PND, orthopnea, lower extremity edema, claudication, DVT or PE-like symptoms.  No lightheadedness, dizziness, near syncope or syncopal episodes.  Blood pressure elevated today but states his blood pressure at home is usually normal.  Past Medical History:  Diagnosis Date  . Arthritis   . Diabetes mellitus  without complication ([place])   . GERD (gastroesophageal reflux disease)   . Hypertension   . Sleep apnea     Past Surgical History:  Procedure Laterality Date  . BACK SURGERY    . COLONOSCOPY N/A 02/21/2015   Procedure: COLONOSCOPY;  Surgeon: Rogene Houston, MD;  [facility];  Service: Endoscopy;  Laterality: N/A;  1025  . LEFT HEART CATH AND CORONARY ANGIOGRAPHY N/A 12/23/2020   Procedure: LEFT HEART CATH AND CORONARY ANGIOGRAPHY;  Surgeon: Troy Sine, MD;  Location: [place];  Service: Cardiovascular;  Laterality: N/A;  . NECK SURGERY    . SHOULDER SURGERY      Current Outpatient Medications  Medication Sig Dispense Refill  . aspirin EC 81 MG tablet Take 81 mg by mouth every other day.    . metFORMIN (GLUCOPHAGE-XR) 500 MG 24 hr tablet Take 2 tablets (1,000 mg total) by mouth 2 (two) times daily.    . Multiple Vitamins-Minerals (CENTRUM) tablet Take 1 tablet by mouth daily.    . Multiple Vitamins-Minerals (PRESERVISION AREDS 2 PO) Take 1 tablet by mouth daily.    . Omega-3 Fatty Acids (FISH OIL) 1000 MG CAPS Take 1,000 mg by mouth daily.    Marland Kitchen OVER THE COUNTER MEDICATION Take 1 tablet by mouth daily. Super Beta prostate formula    . pantoprazole (PROTONIX) 40 MG tablet Take 1 tablet (40 mg total) by mouth daily. 7 tablet 0  . tadalafil (CIALIS) 5 MG tablet Take 1 tablet (5 mg total) by mouth daily as needed for erectile dysfunction.  30 tablet 11  . Vitamin Mixture (ESTER-C PO) Take 1 tablet by mouth daily.     No current facility-administered medications for this visit.   Allergies:  Penicillins and Ranibizumab   Social History: The patient  reports that he has never smoked. He has never used smokeless tobacco. He reports that he does not drink alcohol and does not use drugs.   Family History: The patient's family history is not on file.   ROS:  Please see the history of present illness. Otherwise, complete review of systems is positive for none.  All other  systems are reviewed and negative.   Physical Exam: VS:  BP (!) 142/70   Pulse 84   Ht 6' 1.5" (1.867 m)   Wt 187 lb 12.8 oz (85.2 kg)   SpO2 99%   BMI 24.44 kg/m , BMI Body mass index is 24.44 kg/m.  Wt Readings from Last 3 Encounters:  01/04/21 187 lb 12.8 oz (85.2 kg)  12/25/20 188 lb 3.2 oz (85.4 kg)  12/06/20 193 lb (87.5 kg)    General: Patient appears comfortable at rest. Neck: Supple, no elevated JVP or carotid bruits, no thyromegaly. Lungs: Clear to auscultation, nonlabored breathing at rest. Cardiac: Regular rate and rhythm, no S3 or significant systolic murmur, no pericardial rub. Extremities: No pitting edema, distal pulses 2+. Skin: Warm and dry. Musculoskeletal: No kyphosis. Neuropsychiatric: Alert and oriented x3, affect grossly appropriate.  ECG:  EKG Jan 11, 2021 demonstrated sinus rhythm with prolonged PR rate of 86.  Inferior infarct acute ST elevation consider anterior injury.  Recent Labwork: 01-11-21: BUN 12; Creatinine, Ser 0.86; Potassium 4.2; Sodium 131 12/24/2020: Hemoglobin 11.8; Platelets 177     Component Value Date/Time   CHOL 130 12/24/2020 0629   TRIG 72 12/24/2020 0629   HDL 55 12/24/2020 0629   CHOLHDL 2.4 12/24/2020 0629   VLDL 14 12/24/2020 0629   LDLCALC 61 12/24/2020 0629    Other Studies Reviewed Today:    Cath: 2021-01-11 Normal epicardial coronary arteries with a dominant right coronary system.  Normal LV contractility with EF estimated at 60% without focal segmental wall motion abnormalities; LVEDP 16 mmHg.  RECOMMENDATION: The patient ECG was suggestive of possible early repolarization versus pericardial chest pain. Epicardial coronary arteries are normal. 2D echo Doppler study will be scheduled. His chest tightness has resolved but he does have some residual pleuritic-like chest discomfort. Will check sedimentation rate. Obtain serial ECG. Consider colchicine if pericarditis.  Diagnostic Dominance:  Right      Echo: 12/24/20 IMPRESSIONS  1. Left ventricular ejection fraction, by estimation, is 60 to 65%. The  left ventricle has normal function. The left ventricle has no regional  wall motion abnormalities. There is moderate asymmetric left ventricular  hypertrophy of the basal and septal  segments. Left ventricular diastolic parameters were normal.  2. Right ventricular systolic function is normal. The right ventricular  size is normal. There is normal pulmonary artery systolic pressure.  3. The pericardial effusion is anterior to the right ventricle.  4. The mitral valve is normal in structure. No evidence of mitral valve  regurgitation. No evidence of mitral stenosis.  5. The aortic valve is tricuspid. There is mild calcification of the  aortic valve. Aortic valve regurgitation is mild. Mild aortic valve  sclerosis is present, with no evidence of aortic valve stenosis.  6. The inferior vena cava is normal in size with greater than 50%  respiratory variability, suggesting right atrial pressure of 3 mmHg.  _____________  Assessment and Plan:  1. Chest pain, unspecified type   2. Pericarditis, unspecified chronicity, unspecified type   3. Bilateral leg edema    1. Chest pain, unspecified type Recent presentation with chest pain with EKG changes.  Underwent cardiac catheterization with evidence of normal coronary arteries.  Continue aspirin 81 mg.  2. Pericarditis, unspecified chronicity, unspecified type Was diagnosed with acute pericarditis and started on colchicine 0.6 mg p.o. twice daily x3 months.  He is currently finished 1 months worth of colchicine and has finished the ibuprofen.  States he is feeling much better.  He denies any chest pain, pressure, tightness.  States he is able to bend over now without chest pain.  Continue colchicine 0.6 mg p.o. twice daily.  Follow-up in 1 month.  3. Bilateral leg edema Has some mild bilateral lower extremity edema  nonpitting.Recent echocardiogram during hospital stay on 12/24/2020 demonstrated EF 60 to 65%.  No WMA's.  Moderate asymmetric LVH of basal septal segments.  Mild AR.   Medication Adjustments/Labs and Tests Ordered: Current medicines are reviewed at length with the patient today.  Concerns regarding medicines are outlined above.   Disposition: Follow-up with Dr. Harl Bowie or [facility] 1 month  Signed, Levell July, NP 01/04/2021 3:33 PM    [facility], [place], [place] [address] Phone: [phone]; Fax: [phone]

## 2021-01-04 ENCOUNTER — Ambulatory Visit: Payer: Medicare HMO | Admitting: Family Medicine

## 2021-01-04 ENCOUNTER — Encounter: Payer: Self-pay | Admitting: Family Medicine

## 2021-01-04 ENCOUNTER — Other Ambulatory Visit: Payer: Self-pay

## 2021-01-04 VITALS — BP 142/70 | HR 84 | Ht 73.5 in | Wt 187.8 lb

## 2021-01-04 DIAGNOSIS — R079 Chest pain, unspecified: Secondary | ICD-10-CM

## 2021-01-04 DIAGNOSIS — I319 Disease of pericardium, unspecified: Secondary | ICD-10-CM

## 2021-01-04 DIAGNOSIS — R600 Localized edema: Secondary | ICD-10-CM

## 2021-01-04 MED ORDER — COLCHICINE 0.6 MG PO TABS: 0.6000 mg | ORAL_TABLET | Freq: Two times a day (BID) | ORAL | 3 refills | Status: DC

## 2021-01-04 NOTE — Patient Instructions (Addendum)
Medication Instructions:   Continue with the Colchicine 0.6mg  twice a day.  Continue all current medications.  Labwork: none  Testing/Procedures: none  Follow-Up: 1 month   Any Other Special Instructions Will Be Listed Below (If Applicable).  If you need a refill on your cardiac medications before your next appointment, please call your pharmacy.

## 2021-01-24 ENCOUNTER — Other Ambulatory Visit: Payer: Medicare HMO | Admitting: Urology

## 2021-01-29 ENCOUNTER — Ambulatory Visit: Payer: Medicare HMO | Admitting: Family Medicine

## 2021-02-21 LAB — BASIC METABOLIC PANEL WITH GFR
BUN: 18 (ref 4–21)
Creatinine: 1.2 (ref 0.6–1.3)

## 2021-02-21 LAB — COMPREHENSIVE METABOLIC PANEL WITH GFR: GFR calc Af Amer: 65

## 2021-07-23 LAB — HEMOGLOBIN A1C: Hemoglobin A1C: 11.9

## 2021-09-26 ENCOUNTER — Ambulatory Visit: Payer: Self-pay | Admitting: Nurse Practitioner

## 2021-10-08 ENCOUNTER — Ambulatory Visit: Payer: Self-pay | Admitting: Nurse Practitioner

## 2021-10-15 ENCOUNTER — Ambulatory Visit: Payer: Medicare HMO | Admitting: Nurse Practitioner

## 2021-10-15 ENCOUNTER — Encounter: Payer: Self-pay | Admitting: Nurse Practitioner

## 2021-10-15 ENCOUNTER — Other Ambulatory Visit: Payer: Self-pay

## 2021-10-15 VITALS — BP 163/76 | HR 68 | Ht 71.0 in | Wt 187.6 lb

## 2021-10-15 DIAGNOSIS — I10 Essential (primary) hypertension: Secondary | ICD-10-CM

## 2021-10-15 DIAGNOSIS — Z794 Long term (current) use of insulin: Secondary | ICD-10-CM

## 2021-10-15 DIAGNOSIS — E1165 Type 2 diabetes mellitus with hyperglycemia: Secondary | ICD-10-CM

## 2021-10-15 NOTE — Patient Instructions (Signed)

## 2021-10-15 NOTE — Progress Notes (Signed)
Endocrinology Consult Note       10/15/2021, 11:05 AM   Subjective:    Patient ID: Lawrence [name], male    DOB: 1945-11-23.  Lawrence [name] is being seen in consultation for management of currently uncontrolled symptomatic diabetes requested by  [place] Nation, MD.   Past Medical History:  Diagnosis Date   Arthritis    Diabetes mellitus without complication ([place])    GERD (gastroesophageal reflux disease)    Hypertension    Sleep apnea     Past Surgical History:  Procedure Laterality Date   BACK SURGERY     COLONOSCOPY N/A 02/21/2015   Procedure: COLONOSCOPY;  Surgeon: Rogene Houston, MD;  [facility];  Service: Endoscopy;  Laterality: N/A;  1025   LEFT HEART CATH AND CORONARY ANGIOGRAPHY N/A 12/23/2020   Procedure: LEFT HEART CATH AND CORONARY ANGIOGRAPHY;  Surgeon: Troy Sine, MD;  Location: [facility];  Service: Cardiovascular;  Laterality: N/A;   NECK SURGERY     SHOULDER SURGERY      Social History   Socioeconomic History   Marital status: Married    Spouse name: Not on file   Number of children: 3   Years of education: Not on file   Highest education level: Not on file  Occupational History   Occupation: retired  Tobacco Use   Smoking status: Never   Smokeless tobacco: Never  Vaping Use   Vaping Use: Never used  Substance and Sexual Activity   Alcohol use: No   Drug use: No   Sexual activity: Not on file  Other Topics Concern   Not on file  Social History Narrative   Not on file   Social Determinants of Health   Financial Resource Strain: Not on file  Food Insecurity: Not on file  Transportation Needs: Not on file  Physical Activity: Not on file  Stress: Not on file  Social Connections: Not on file    History reviewed. No pertinent family history.  Outpatient Encounter Medications as of 10/15/2021  Medication Sig   aspirin EC 81 MG tablet Take 81 mg  by mouth every other day.   colchicine 0.6 MG tablet Take 1 tablet (0.6 mg total) by mouth 2 (two) times daily.   hydrochlorothiazide (HYDRODIURIL) 25 MG tablet Take 25 mg by mouth daily.   LEVEMIR FLEXTOUCH 100 UNIT/ML FlexTouch Pen Inject 6 Units into the skin at bedtime.   Multiple Vitamins-Minerals (CENTRUM) tablet Take 1 tablet by mouth daily.   Multiple Vitamins-Minerals (PRESERVISION AREDS 2 PO) Take 1 tablet by mouth daily.   OVER THE COUNTER MEDICATION Take 1 tablet by mouth daily. Super Beta prostate formula   tadalafil (CIALIS) 5 MG tablet Take 1 tablet (5 mg total) by mouth daily as needed for erectile dysfunction.   Vitamin Mixture (ESTER-C PO) Take 1 tablet by mouth daily.   [DISCONTINUED] metFORMIN (GLUCOPHAGE-XR) 500 MG 24 hr tablet Take 2 tablets (1,000 mg total) by mouth 2 (two) times daily. (Patient taking differently: Take 500 mg by mouth 2 (two) times daily.)   [DISCONTINUED] Omega-3 Fatty Acids (FISH OIL) 1000 MG CAPS Take 1,000 mg by mouth daily. (Patient not taking: Reported  on 10/15/2021)   [DISCONTINUED] pantoprazole (PROTONIX) 40 MG tablet Take 1 tablet (40 mg total) by mouth daily. (Patient not taking: Reported on 10/15/2021)   No facility-administered encounter medications on file as of 10/15/2021.    ALLERGIES: Allergies  Allergen Reactions   Penicillins Hives   Ranibizumab Rash    VACCINATION STATUS:  There is no immunization history on file for this patient.  Diabetes He presents for his initial diabetic visit. He has type 2 diabetes mellitus. Onset time: Diagnosed at approx age of 73. His disease course has been improving. There are no hypoglycemic associated symptoms. There are no diabetic associated symptoms. There are no hypoglycemic complications. Symptoms are stable. Diabetic complications include retinopathy (mild per his report of recent eye exam). Risk factors for coronary artery disease include diabetes mellitus, hypertension and male sex. Current  diabetic treatment includes insulin injections, diet and oral agent (monotherapy). He is compliant with treatment most of the time. His weight is stable. He is following a generally healthy, vegetarian and diabetic diet. Meal planning includes avoidance of concentrated sweets and ADA exchanges. He has not had a previous visit with a dietitian. He participates in exercise intermittently. His home blood glucose trend is decreasing steadily. (He presents today for his consultation with his logs showing at target fasting and postprandial glycemic profile.  His most recent A1c from November was 11.9%.  He monitors glucose routinely twice per day.  He drinks only water (sometimes flavored with lemon), and eats 2 meals per day with occasional snacks.  He does engage in routine physical activity but says he has slacked over the winter due to the cold weather.  He does have plans to restart.  He is UTD on eye exam and saw a podiatrist in the past.) An ACE inhibitor/angiotensin II receptor blocker is not being taken. He sees a podiatrist.Eye exam is current.  Hypertension This is a chronic problem. The current episode started more than 1 year ago. The problem is unchanged. The problem is controlled. There are no associated agents to hypertension. Risk factors for coronary artery disease include diabetes mellitus and male gender. Past treatments include diuretics. The current treatment provides mild improvement. There are no compliance problems.  Hypertensive end-organ damage includes retinopathy (mild per his report of recent eye exam).    Review of systems  Constitutional: + Minimally fluctuating body weight, current Body mass index is 26.16 kg/m., no fatigue, no subjective hyperthermia, no subjective hypothermia Eyes: no blurry vision, no xerophthalmia ENT: no sore throat, no nodules palpated in throat, no dysphagia/odynophagia, no hoarseness Cardiovascular: no chest pain, no shortness of breath, no palpitations,  no leg swelling Respiratory: no cough, no shortness of breath Gastrointestinal: no nausea/vomiting/diarrhea Musculoskeletal: no muscle/joint aches Skin: no rashes, no hyperemia Neurological: no tremors, no numbness, no tingling, no dizziness Psychiatric: no depression, no anxiety  Objective:     BP (!) 163/76    Pulse 68    Ht 5\' 11"  (1.803 m)    Wt 187 lb 9.6 oz (85.1 kg)    SpO2 100%    BMI 26.16 kg/m   Wt Readings from Last 3 Encounters:  10/15/21 187 lb 9.6 oz (85.1 kg)  01/04/21 187 lb 12.8 oz (85.2 kg)  12/25/20 188 lb 3.2 oz (85.4 kg)     BP Readings from Last 3 Encounters:  10/15/21 (!) 163/76  01/04/21 (!) 142/70  12/25/20 (!) 164/84     Physical Exam- Limited  Constitutional:  Body mass index is 26.16 kg/m. ,  not in acute distress, normal state of mind Eyes:  EOMI, no exophthalmos Neck: Supple Musculoskeletal: no gross deformities, strength intact in all four extremities, no gross restriction of joint movements Skin:  no rashes, no hyperemia Neurological: no tremor with outstretched hands    CMP ( most recent) CMP     Component Value Date/Time   NA 131 (L) 12/23/2020 1834   K 4.2 12/23/2020 1834   CL 96 (L) 12/23/2020 1834   CO2 25 12/23/2020 1834   GLUCOSE 408 (H) 12/23/2020 1834   BUN 18 02/21/2021 0000   CREATININE 1.2 02/21/2021 0000   CREATININE 0.86 12/23/2020 1834   CALCIUM 9.2 12/23/2020 1834   GFRNONAA >60 12/23/2020 1834   GFRAA 65 02/21/2021 0000     Diabetic Labs (most recent): Lab Results  Component Value Date   HGBA1C 11.9 07/23/2021   HGBA1C 10.4 (H) 12/23/2020   HGBA1C 9.3 10/27/2020     Lipid Panel ( most recent) Lipid Panel     Component Value Date/Time   CHOL 130 12/24/2020 0629   TRIG 72 12/24/2020 0629   HDL 55 12/24/2020 0629   CHOLHDL 2.4 12/24/2020 0629   VLDL 14 12/24/2020 0629   LDLCALC 61 12/24/2020 0629      Lab Results  Component Value Date   TSH 1.46 10/27/2020           Assessment & Plan:    1) Type 2 diabetes mellitus with hyperglycemia, with long-term current use of insulin ([place])  He presents today for his consultation with his logs showing at target fasting and postprandial glycemic profile.  His most recent A1c from November was 11.9%.  He monitors glucose routinely twice per day.  He drinks only water (sometimes flavored with lemon), and eats 2 meals per day with occasional snacks.  He does engage in routine physical activity but says he has slacked over the winter due to the cold weather.  He does have plans to restart.  He is UTD on eye exam and saw a podiatrist in the past.  - Janet Decesare has currently uncontrolled symptomatic type 2 DM since 76 years of age, with most recent A1c of 11.9 %.   -Recent labs reviewed.  - I had a long discussion with him about the progressive nature of diabetes and the pathology behind its complications. -his diabetes is complicated by mild retinopathy and he remains at a high risk for more acute and chronic complications which include CAD, CVA, CKD, retinopathy, and neuropathy. These are all discussed in detail with him.  The following Lifestyle Medicine recommendations according to [facility]) were discussed and offered to patient and he agrees to start the journey:  A. Whole Foods, Plant-based plate comprising of fruits and vegetables, plant-based proteins, whole-grain carbohydrates was discussed in detail with the patient.   A list for source of those nutrients were also provided to the patient.  Patient will use only water or unsweetened tea for hydration. B.  The need to stay away from risky substances including alcohol, smoking; obtaining 7 to 9 hours of restorative sleep, at least 150 minutes of moderate intensity exercise weekly, the importance of healthy social connections,  and stress reduction techniques were discussed. C.  A full color page of  Calorie density of various food groups per pound showing  examples of each food groups was provided to the patient.  - I have counseled him on diet and weight management by adopting a carbohydrate restricted/protein rich  diet. Patient is encouraged to switch to unprocessed or minimally processed complex starch and increased protein intake (animal or plant source), fruits, and vegetables. -  he is advised to stick to a routine mealtimes to eat 3 meals a day and avoid unnecessary snacks (to snack only to correct hypoglycemia).   - he acknowledges that there is a room for improvement in his food and drink choices. - Suggestion is made for him to avoid simple carbohydrates from his diet including Cakes, Sweet Desserts, Ice Cream, Soda (diet and regular), Sweet Tea, Candies, Chips, Cookies, Store Bought Juices, Alcohol in Excess of 1-2 drinks a day, Artificial Sweeteners, Coffee Creamer, and "Sugar-free" Products. This will help patient to have more stable blood glucose profile and potentially avoid unintended weight gain.  - I have approached him with the following individualized plan to manage his diabetes and patient agrees:   -He is encouraged to continue Levemir 6 units SQ nightly (may have opportunity to stop this on subsequent visits).  -he is encouraged to continue monitoring glucose twice daily, before breakfast and before bed, to log their readings on the clinic sheets provided, and bring them to review at follow up appointment in 4 weeks.  He asked about obtaining a CGM device.  I informed him that because he is only on one injection of insulin per day, his insurance may not cover such a device.  Nevertheless, I did send in script to [facility] for Dexcom.  He could benefit from such a device to see how his glucose reacts to certain foods.  - he is warned not to take insulin without proper monitoring per orders. - Adjustment parameters are given to him for hypo and hyperglycemia in writing. - he is encouraged to call clinic for blood glucose levels less  than 70 or above 300 mg /dl.  - his Metformin will be discontinued, risk outweighs benefit for this patient.  He says he does not like the way this medication makes him feel. - he will be considered for incretin therapy as appropriate next visit.  - Specific targets for  A1c; LDL, HDL, and Triglycerides were discussed with the patient.  2) Blood Pressure /Hypertension:  his blood pressure is controlled to target.   he is advised to continue his current medications including HCTZ 25 mg p.o. daily with breakfast.  3) Lipids/Hyperlipidemia:    Review of his recent lipid panel from 10/27/20 showed controlled LDL at 79.   He is not on any lipid lowering medications.  He is managing with healthy diet and exercise at this time.   4)  Weight/Diet:  his Body mass index is 26.16 kg/m.  - he is not a candidate for major weight loss.  Exercise, and detailed carbohydrates information provided  -  detailed on discharge instructions.  5) Chronic Care/Health Maintenance: -he is not on ACEI/ARB or Statin medications and is encouraged to initiate and continue to follow up with Ophthalmology, Dentist, Podiatrist at least yearly or according to recommendations, and advised to stay away from smoking. I have recommended yearly flu vaccine and pneumonia vaccine at least every 5 years; moderate intensity exercise for up to 150 minutes weekly; and sleep for at least 7 hours a day.  - he is advised to maintain close follow up with [place] Nation, MD for primary care needs, as well as his other providers for optimal and coordinated care.   - Time spent in this patient care: 60 min, of which > 50% was spent in  counseling him about his diabetes and the rest reviewing his blood glucose logs, discussing his hypoglycemia and hyperglycemia episodes, reviewing his current and previous labs/studies (including abstraction from other facilities) and medications doses and developing a long term treatment plan based on the latest  standards of care/guidelines; and documenting his care.    Please refer to Patient Instructions for Blood Glucose Monitoring and Insulin/Medications Dosing Guide" in media tab for additional information. Please also refer to "Patient Self Inventory" in the Media tab for reviewed elements of pertinent patient history.  Josefina Do participated in the discussions, expressed understanding, and voiced agreement with the above plans.  All questions were answered to his satisfaction. he is encouraged to contact clinic should he have any questions or concerns prior to his return visit.     Follow up plan: - Return in about 1 month (around 11/15/2021) for Diabetes F/U with A1c in office, Bring meter and logs, No previsit labs.    Rayetta Pigg, [facility] [address] [place], [place] [address] Phone: [phone] Fax: [phone]  10/15/2021, 11:05 AM

## 2021-10-17 ENCOUNTER — Telehealth (INDEPENDENT_AMBULATORY_CARE_PROVIDER_SITE_OTHER): Payer: Self-pay | Admitting: *Deleted

## 2021-10-17 ENCOUNTER — Other Ambulatory Visit (INDEPENDENT_AMBULATORY_CARE_PROVIDER_SITE_OTHER): Payer: Self-pay

## 2021-10-17 ENCOUNTER — Encounter (INDEPENDENT_AMBULATORY_CARE_PROVIDER_SITE_OTHER): Payer: Self-pay | Admitting: *Deleted

## 2021-10-17 DIAGNOSIS — Z86010 Personal history of colonic polyps: Secondary | ICD-10-CM

## 2021-10-17 NOTE — Telephone Encounter (Signed)
Patient needs trilyte 

## 2021-10-17 NOTE — Telephone Encounter (Signed)
Referring MD/PCP: williams  Procedure: tcs  Reason/Indication:  hx polyps  Has patient had this procedure before?  Yes, 2016  If so, when, by whom and where?    Is there a family history of colon cancer?  no  Who?  What age when diagnosed?    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  no  Do you have a pacemaker/defibrillator?  no  Has patient ever had endocarditis/atrial fibrillation? no  Does patient use oxygen? no  Has patient had joint replacement within last 12 months?  no  Is patient constipated or do they take laxatives? no  Does patient have a history of alcohol/drug use?  no  Have you had a stroke/heart attack last 6 mths? no  Do you take medicine for weight loss?  no  For male patients,: have you had a hysterectomy                       are you post menopausal                       do you still have your menstrual cycle   Is patient on blood thinner such as Coumadin, Plavix and/or Aspirin? yes  Medications: asa 81 mg daily, levemir 6 mg at bedtime, metformin 500 mg bid, hctz 25 mg daily  Allergies: nkda  Medication Adjustment per Dr Rehman/Dr Jenetta Downer asa 2 days, 1/2 levemir night before and hold morning of, metformin hold evening before and morning of  Procedure date & time: 11/13/21

## 2021-10-21 MED ORDER — PEG 3350-KCL-NA BICARB-NACL 420 G PO SOLR: 4000.0000 mL | Freq: Once | ORAL | 0 refills | Status: AC

## 2021-10-27 IMAGING — US US EXTREM LOW VENOUS
1 series · 13 of 24 positions shown · non-contrast
Comparison: None.

CLINICAL DATA: Bilateral lower extremity edema for the past 2
years. Evaluate for DVT.



[Series 1: us venous img lower bilat (dvt) · portal-venous · 13 of 66 slices shown]
[im 1/66]
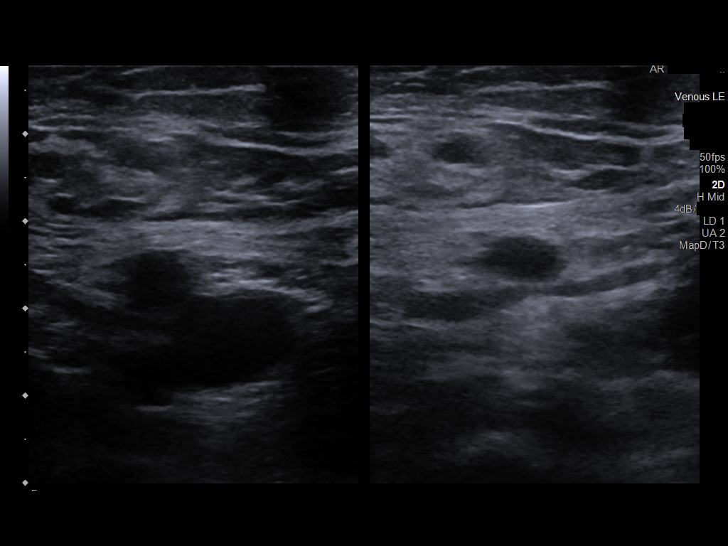
[im 6/66]
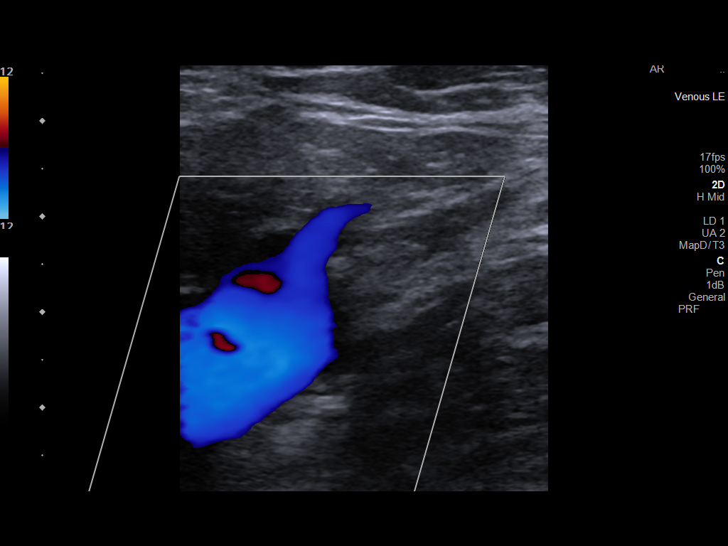
[im 12/66]
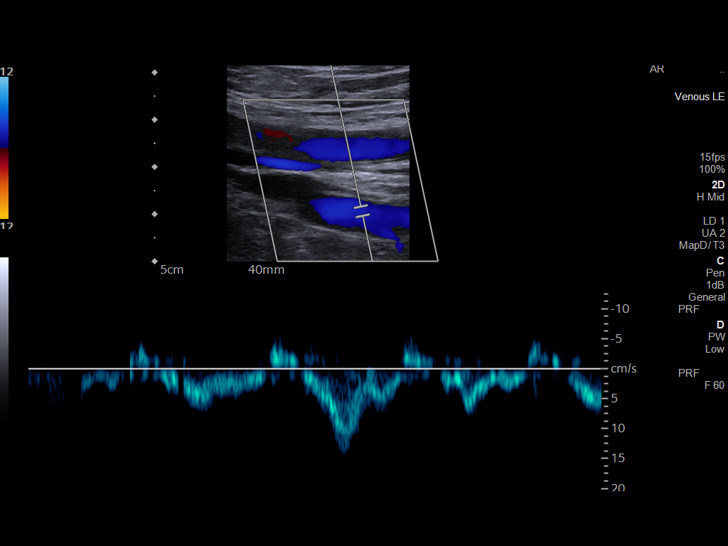
[im 17/66]
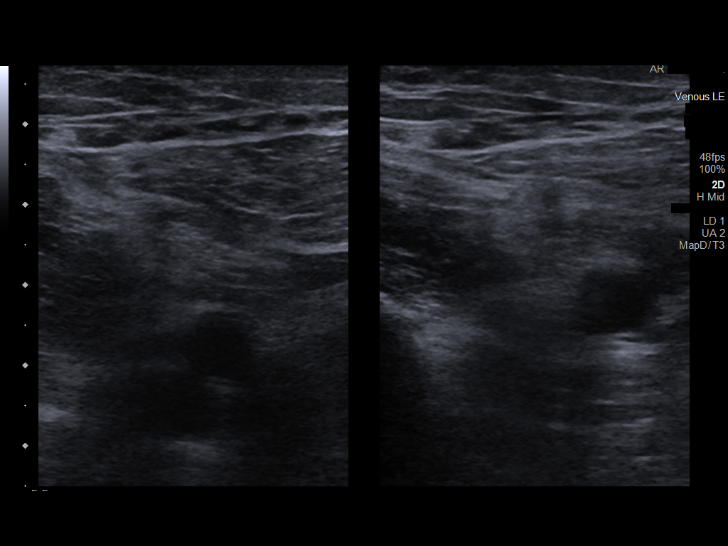
[im 23/66]
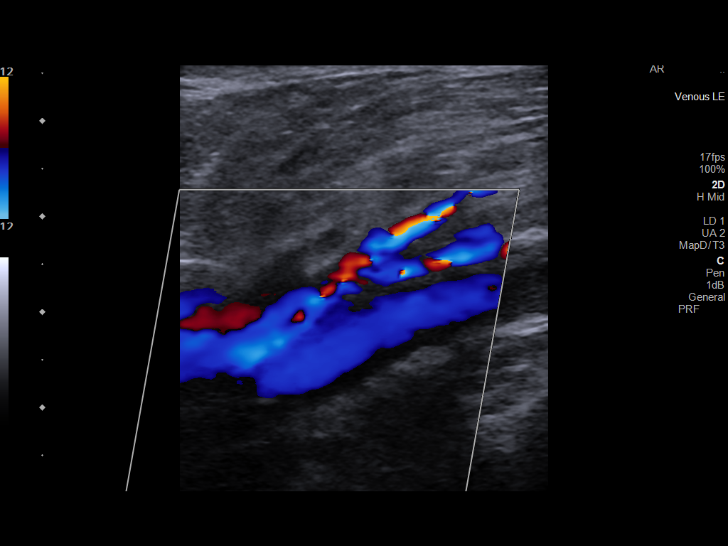
[im 29/66]
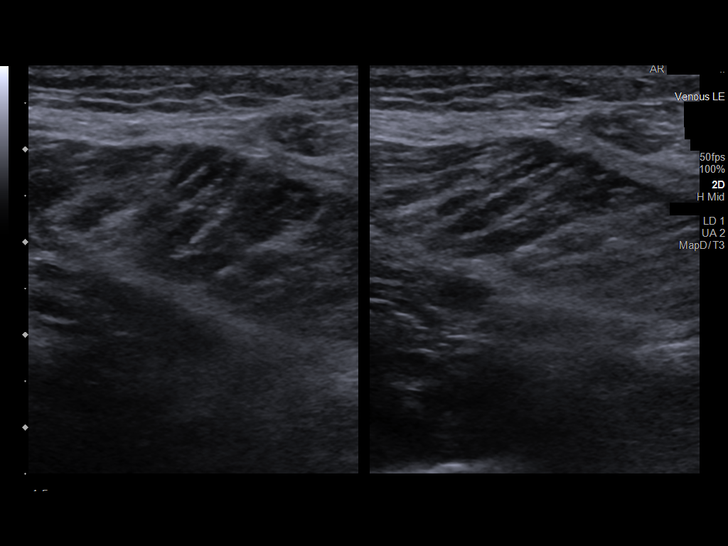
[im 34/66]
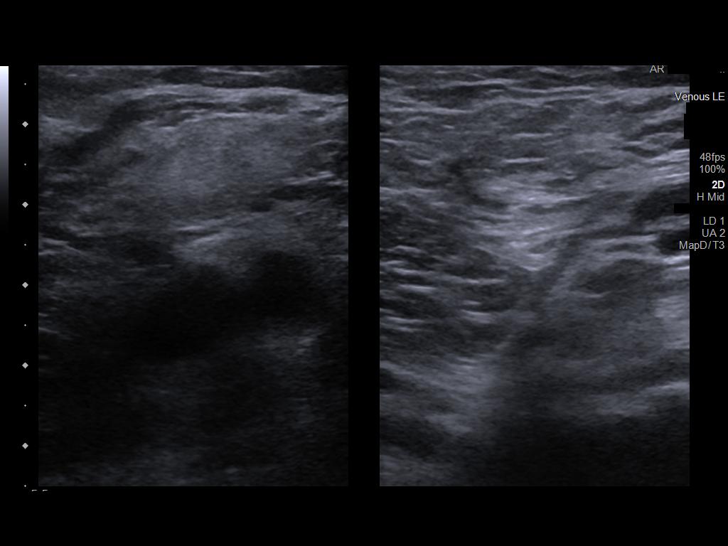
[im 37/66]
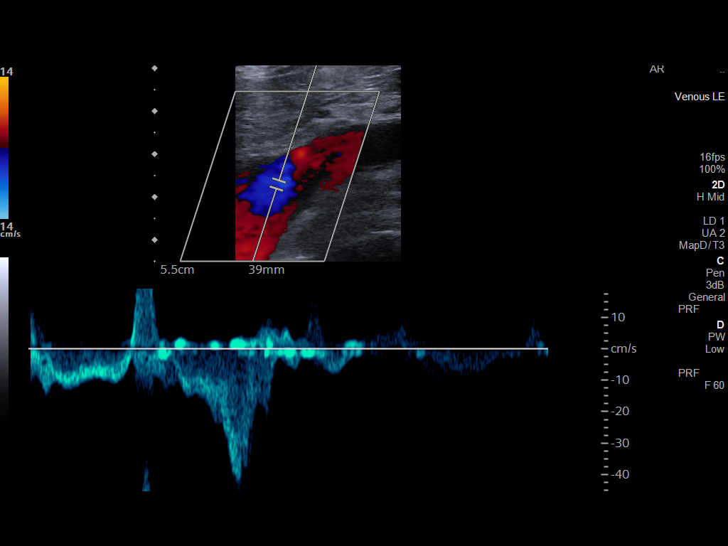
[im 43/66]
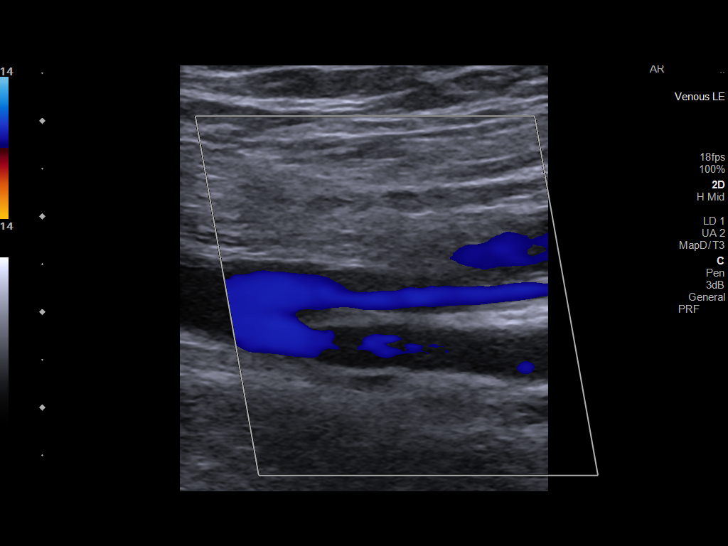
[im 49/66]
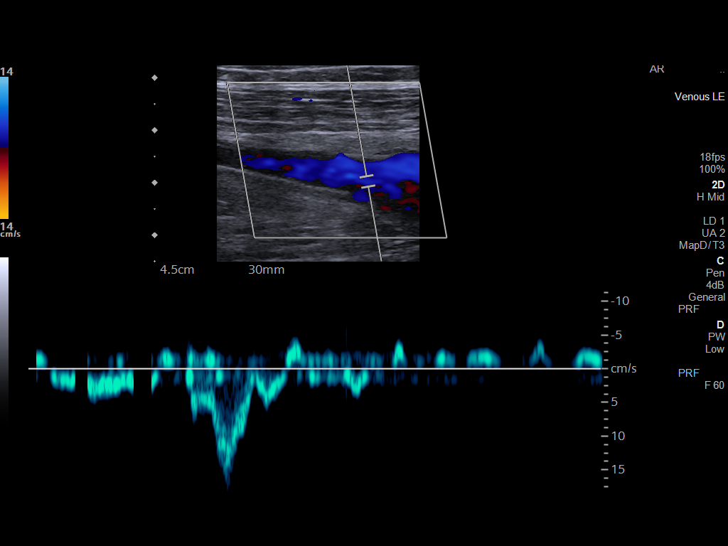
[im 54/66]
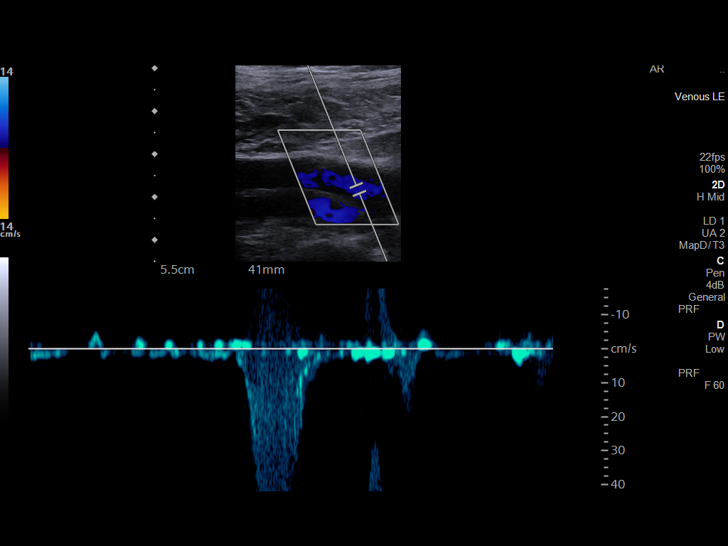
[im 60/66]
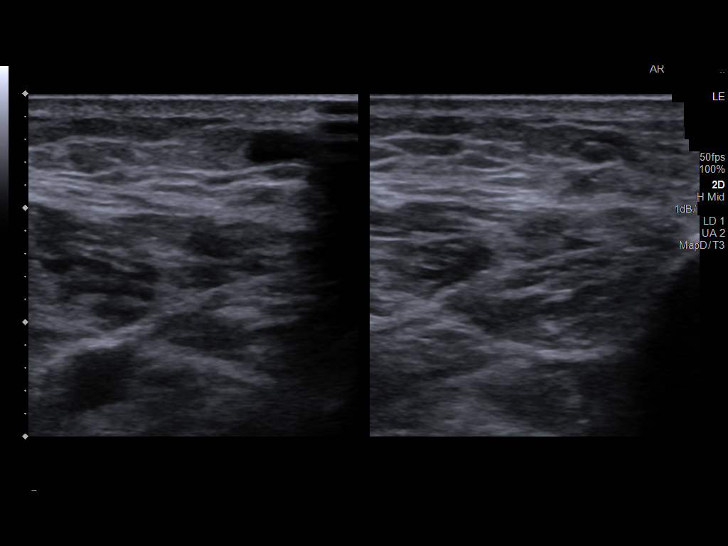
[im 66/66]
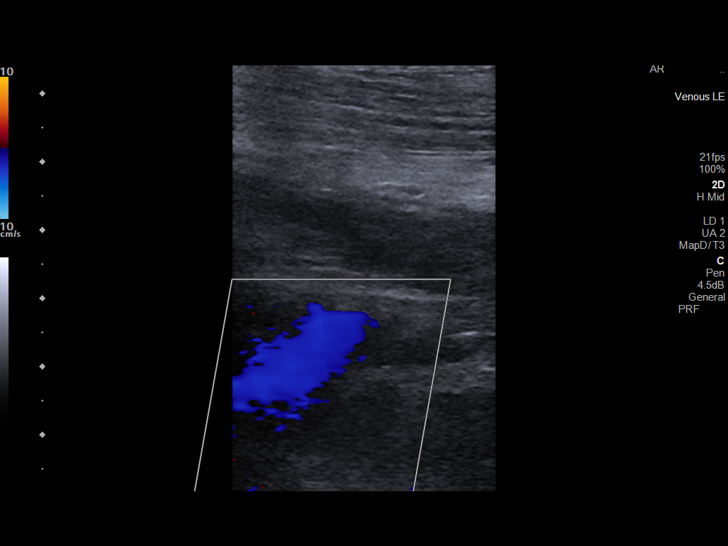

[13 of 24 positions shown; findings below may reference images not displayed]

FINDINGS: RIGHT LOWER EXTREMITY

Common Femoral Vein: No evidence of thrombus. Normal
compressibility, respiratory phasicity and response to augmentation.

Saphenofemoral Junction: No evidence of thrombus. Normal
compressibility and flow on color Doppler imaging.

Profunda Femoral Vein: No evidence of thrombus. Normal
compressibility and flow on color Doppler imaging.

Femoral Vein: No evidence of thrombus. Normal compressibility,
respiratory phasicity and response to augmentation.

Popliteal Vein: No evidence of thrombus. Normal compressibility,
respiratory phasicity and response to augmentation.

Calf Veins: No evidence of thrombus. Normal compressibility and flow
on color Doppler imaging.

Superficial Great Saphenous Vein: No evidence of thrombus. Normal
compressibility.

Venous Reflux:  None.

Other Findings:  None.

LEFT LOWER EXTREMITY

Common Femoral Vein: No evidence of thrombus. Normal
compressibility, respiratory phasicity and response to augmentation.

Saphenofemoral Junction: No evidence of thrombus. Normal
compressibility and flow on color Doppler imaging.

Profunda Femoral Vein: No evidence of thrombus. Normal
compressibility and flow on color Doppler imaging.

Femoral Vein: No evidence of thrombus. Normal compressibility,
respiratory phasicity and response to augmentation.

Popliteal Vein: No evidence of thrombus. Normal compressibility,
respiratory phasicity and response to augmentation.

Calf Veins: No evidence of thrombus. Normal compressibility and flow
on color Doppler imaging.

Superficial Great Saphenous Vein: No evidence of thrombus. Normal
compressibility.

Venous Reflux:  None.

Other Findings:  None.
IMPRESSION: No evidence of DVT within either lower extremity.

## 2021-11-08 NOTE — Patient Instructions (Signed)
Lawrence [name]  11/08/2021     @PREFPERIOPPHARMACY @   Your procedure is scheduled on  11/13/2021.   Report to [facility] at  1230  P.M.   Call this number if you have problems the morning of surgery:  [phone]   Remember:  Follow the diet and prep instructions given to you by the office.    Take 3 units of your levemir the night before your procedure.    DO NOT take any medications for diabetes the morning of your procedure.    Take these medicines the morning of surgery with A SIP OF WATER                                            None    Do not wear jewelry, make-up or nail polish.  Do not wear lotions, powders, or perfumes, or deodorant.  Do not shave 48 hours prior to surgery.  Men may shave face and neck.  Do not bring valuables to the hospital.  [facility] is not responsible for any belongings or valuables.  Contacts, dentures or bridgework may not be worn into surgery.  Leave your suitcase in the car.  After surgery it may be brought to your room.  For patients admitted to the hospital, discharge time will be determined by your treatment team.  Patients discharged the day of surgery will not be allowed to drive home and must have someone with them for 24 hours.    Special instructions:   DO NOT smoke tobacco or vape for 24 hours before your procedure.  Please read over the following fact sheets that you were given. Anesthesia Post-op Instructions and Care and Recovery After Surgery      Colonoscopy, Adult, Care After This sheet gives you information about how to care for yourself after your procedure. Your health care provider may also give you more specific instructions. If you have problems or questions, contact your health care provider. What can I expect after the procedure? After the procedure, it is common to have: A small amount of blood in your stool for 24 hours after the procedure. Some gas. Mild cramping or bloating of your  abdomen. Follow these instructions at home: Eating and drinking  Drink enough fluid to keep your urine pale yellow. Follow instructions from your health care provider about eating or drinking restrictions. Resume your normal diet as instructed by your health care provider. Avoid heavy or fried foods that are hard to digest. Activity Rest as told by your health care provider. Avoid sitting for a long time without moving. Get up to take short walks every 1-2 hours. This is important to improve blood flow and breathing. Ask for help if you feel weak or unsteady. Return to your normal activities as told by your health care provider. Ask your health care provider what activities are safe for you. Managing cramping and bloating  Try walking around when you have cramps or feel bloated. Apply heat to your abdomen as told by your health care provider. Use the heat source that your health care provider recommends, such as a moist heat pack or a heating pad. Place a towel between your skin and the heat source. Leave the heat on for 20-30 minutes. Remove the heat if your skin turns bright red. This is especially important if you are unable  to feel pain, heat, or cold. You may have a greater risk of getting burned. General instructions If you were given a sedative during the procedure, it can affect you for several hours. Do not drive or operate machinery until your health care provider says that it is safe. For the first 24 hours after the procedure: Do not sign important documents. Do not drink alcohol. Do your regular daily activities at a slower pace than normal. Eat soft foods that are easy to digest. Take over-the-counter and prescription medicines only as told by your health care provider. Keep all follow-up visits as told by your health care provider. This is important. Contact a health care provider if: You have blood in your stool 2-3 days after the procedure. Get help right away if you  have: More than a small spotting of blood in your stool. Large blood clots in your stool. Swelling of your abdomen. Nausea or vomiting. A fever. Increasing pain in your abdomen that is not relieved with medicine. Summary After the procedure, it is common to have a small amount of blood in your stool. You may also have mild cramping and bloating of your abdomen. If you were given a sedative during the procedure, it can affect you for several hours. Do not drive or operate machinery until your health care provider says that it is safe. Get help right away if you have a lot of blood in your stool, nausea or vomiting, a fever, or increased pain in your abdomen. This information is not intended to replace advice given to you by your health care provider. Make sure you discuss any questions you have with your health care provider. Document Revised: 07/15/2019 Document Reviewed: 04/04/2019 Elsevier Patient Education  [place] After This sheet gives you information about how to care for yourself after your procedure. Your health care provider may also give you more specific instructions. If you have problems or questions, contact your health care provider. What can I expect after the procedure? After the procedure, it is common to have: Tiredness. Forgetfulness about what happened after the procedure. Impaired judgment for important decisions. Nausea or vomiting. Some difficulty with balance. Follow these instructions at home: For the time period you were told by your health care provider:   Rest as needed. Do not participate in activities where you could fall or become injured. Do not drive or use machinery. Do not drink alcohol. Do not take sleeping pills or medicines that cause drowsiness. Do not make important decisions or sign legal documents. Do not take care of children on your own. Eating and drinking Follow the diet that is recommended by  your health care provider. Drink enough fluid to keep your urine pale yellow. If you vomit: Drink water, juice, or soup when you can drink without vomiting. Make sure you have little or no nausea before eating solid foods. General instructions Have a responsible adult stay with you for the time you are told. It is important to have someone help care for you until you are awake and alert. Take over-the-counter and prescription medicines only as told by your health care provider. If you have sleep apnea, surgery and certain medicines can increase your risk for breathing problems. Follow instructions from your health care provider about wearing your sleep device: Anytime you are sleeping, including during daytime naps. While taking prescription pain medicines, sleeping medicines, or medicines that make you drowsy. Avoid smoking. Keep all follow-up visits as told by  your health care provider. This is important. Contact a health care provider if: You keep feeling nauseous or you keep vomiting. You feel light-headed. You are still sleepy or having trouble with balance after 24 hours. You develop a rash. You have a fever. You have redness or swelling around the IV site. Get help right away if: You have trouble breathing. You have new-onset confusion at home. Summary For several hours after your procedure, you may feel tired. You may also be forgetful and have poor judgment. Have a responsible adult stay with you for the time you are told. It is important to have someone help care for you until you are awake and alert. Rest as told. Do not drive or operate machinery. Do not drink alcohol or take sleeping pills. Get help right away if you have trouble breathing, or if you suddenly become confused. This information is not intended to replace advice given to you by your health care provider. Make sure you discuss any questions you have with your health care provider. Document Revised: 05/24/2020  Document Reviewed: 08/11/2019 Elsevier Patient Education  2022 [employer].

## 2021-11-11 ENCOUNTER — Encounter (HOSPITAL_COMMUNITY): Payer: Self-pay

## 2021-11-11 ENCOUNTER — Encounter (HOSPITAL_COMMUNITY)
Admission: RE | Admit: 2021-11-11 | Discharge: 2021-11-11 | Disposition: A | Discharge status: Home / Self Care | Payer: Medicare HMO | Source: Ambulatory Visit | Attending: Internal Medicine | Admitting: Internal Medicine

## 2021-11-11 VITALS — BP 144/62 | HR 81 | Temp 98.5°F | Resp 18 | Ht 73.0 in | Wt 183.0 lb

## 2021-11-11 DIAGNOSIS — E119 Type 2 diabetes mellitus without complications: Secondary | ICD-10-CM | POA: Diagnosis not present

## 2021-11-11 DIAGNOSIS — Z01812 Encounter for preprocedural laboratory examination: Secondary | ICD-10-CM | POA: Insufficient documentation

## 2021-11-11 LAB — BASIC METABOLIC PANEL
Anion gap: 7 (ref 5–15)
BUN: 16 mg/dL (ref 8–23)
CO2: 32 mmol/L (ref 22–32)
Calcium: 9.2 mg/dL (ref 8.9–10.3)
Chloride: 97 mmol/L — ABNORMAL LOW (ref 98–111)
Creatinine, Ser: 0.92 mg/dL (ref 0.61–1.24)
GFR, Estimated: >60 mL/min (ref >60)
Glucose, Bld: 351 mg/dL — ABNORMAL HIGH (ref 70–99)
Potassium: 3.5 mmol/L (ref 3.5–5.1)
Sodium: 136 mmol/L (ref 135–145)

## 2021-11-12 NOTE — Pre-Procedure Instructions (Signed)
Glucose of 351 reported to Dr Charna Elizabeth.He wants patient to take 6 units of levemirr before bed 11/12/2021.

## 2021-11-13 ENCOUNTER — Ambulatory Visit (HOSPITAL_BASED_OUTPATIENT_CLINIC_OR_DEPARTMENT_OTHER): Payer: Medicare HMO | Admitting: Anesthesiology

## 2021-11-13 ENCOUNTER — Ambulatory Visit (HOSPITAL_COMMUNITY)
Admission: RE | Admit: 2021-11-13 | Discharge: 2021-11-13 | Disposition: A | Discharge status: Home / Self Care | Payer: Medicare HMO | Attending: Internal Medicine | Admitting: Internal Medicine

## 2021-11-13 ENCOUNTER — Encounter (HOSPITAL_COMMUNITY): Payer: Self-pay | Admitting: Internal Medicine

## 2021-11-13 ENCOUNTER — Encounter (HOSPITAL_COMMUNITY)
Admission: RE | Disposition: A | Discharge status: Home / Self Care | Payer: Self-pay | Source: Home / Self Care | Attending: Internal Medicine

## 2021-11-13 ENCOUNTER — Ambulatory Visit (HOSPITAL_COMMUNITY): Payer: Medicare HMO | Admitting: Anesthesiology

## 2021-11-13 ENCOUNTER — Encounter (INDEPENDENT_AMBULATORY_CARE_PROVIDER_SITE_OTHER): Payer: Self-pay | Admitting: *Deleted

## 2021-11-13 DIAGNOSIS — K219 Gastro-esophageal reflux disease without esophagitis: Secondary | ICD-10-CM | POA: Insufficient documentation

## 2021-11-13 DIAGNOSIS — I10 Essential (primary) hypertension: Secondary | ICD-10-CM | POA: Insufficient documentation

## 2021-11-13 DIAGNOSIS — D123 Benign neoplasm of transverse colon: Secondary | ICD-10-CM | POA: Diagnosis not present

## 2021-11-13 DIAGNOSIS — E119 Type 2 diabetes mellitus without complications: Secondary | ICD-10-CM | POA: Diagnosis not present

## 2021-11-13 DIAGNOSIS — K635 Polyp of colon: Secondary | ICD-10-CM

## 2021-11-13 DIAGNOSIS — Z7984 Long term (current) use of oral hypoglycemic drugs: Secondary | ICD-10-CM | POA: Insufficient documentation

## 2021-11-13 DIAGNOSIS — K644 Residual hemorrhoidal skin tags: Secondary | ICD-10-CM | POA: Diagnosis not present

## 2021-11-13 DIAGNOSIS — Z09 Encounter for follow-up examination after completed treatment for conditions other than malignant neoplasm: Secondary | ICD-10-CM | POA: Diagnosis not present

## 2021-11-13 DIAGNOSIS — Z1211 Encounter for screening for malignant neoplasm of colon: Secondary | ICD-10-CM | POA: Insufficient documentation

## 2021-11-13 DIAGNOSIS — Z86010 Personal history of colonic polyps: Secondary | ICD-10-CM | POA: Diagnosis not present

## 2021-11-13 HISTORY — PX: COLONOSCOPY: SHX174

## 2021-11-13 HISTORY — PX: POLYPECTOMY: SHX149

## 2021-11-13 HISTORY — PX: COLONOSCOPY WITH PROPOFOL: SHX5780

## 2021-11-13 LAB — HM COLONOSCOPY

## 2021-11-13 LAB — GLUCOSE, CAPILLARY: Glucose-Capillary: 119 mg/dL — ABNORMAL HIGH (ref 70–99)

## 2021-11-13 SURGERY — COLONOSCOPY WITH PROPOFOL
Anesthesia: General

## 2021-11-13 MED ORDER — LIDOCAINE HCL (CARDIAC) PF 100 MG/5ML IV SOSY
PREFILLED_SYRINGE | INTRAVENOUS | Status: DC | PRN
Start: 2021-11-13 — End: 2021-11-13
  Administered 2021-11-13: 50 mg via INTRAVENOUS

## 2021-11-13 MED ORDER — LACTATED RINGERS IV SOLN
INTRAVENOUS | Status: DC
  Administered 2021-11-13: 1000 mL via INTRAVENOUS

## 2021-11-13 MED ORDER — PROPOFOL 10 MG/ML IV BOLUS
INTRAVENOUS | Status: DC | PRN
  Administered 2021-11-13: 40 mg via INTRAVENOUS
  Administered 2021-11-13: 100 mg via INTRAVENOUS
  Administered 2021-11-13: 40 mg via INTRAVENOUS

## 2021-11-13 MED ORDER — PROPOFOL 500 MG/50ML IV EMUL
INTRAVENOUS | Status: DC | PRN
  Administered 2021-11-13: 150 ug/kg/min via INTRAVENOUS

## 2021-11-13 NOTE — Op Note (Signed)
[facility] Patient Name: Lawrence [name] Procedure Date: 11/13/2021 1:05 PM MRN: [id number] Date of Birth: 06-21-1946 Attending MD: Hildred Laser , MD CSN: [id number] Age: 76 Admit Type: Outpatient Procedure:                Colonoscopy Indications:              High risk colon cancer surveillance: Personal                            history of colonic polyps Providers:                Hildred Laser, MD, Crystal Page, Raphael Gibney,                            Technician Referring MD:             Drema Dallas. Jimmye Norman, MD Medicines:                Propofol per Anesthesia Complications:            No immediate complications. Estimated Blood Loss:     Estimated blood loss was minimal. Procedure:                Pre-Anesthesia Assessment:                           - Prior to the procedure, a History and Physical                            was performed, and patient medications and                            allergies were reviewed. The patient's tolerance of                            previous anesthesia was also reviewed. The risks                            and benefits of the procedure and the sedation                            options and risks were discussed with the patient.                            All questions were answered, and informed consent                            was obtained. Prior Anticoagulants: The patient has                            taken no previous anticoagulant or antiplatelet                            agents except for aspirin. ASA Grade Assessment: II                            -  A patient with mild systemic disease. After                            reviewing the risks and benefits, the patient was                            deemed in satisfactory condition to undergo the                            procedure.                           After obtaining informed consent, the colonoscope                            was passed under direct vision. Throughout  the                            procedure, the patient's blood pressure, pulse, and                            oxygen saturations were monitored continuously. The                            [id number]-[id number] ([id number]) scope was introduced through                            the anus and advanced to the the cecum, identified                            by appendiceal orifice and ileocecal valve. The                            colonoscopy was technically difficult and complex                            due to a redundant colon. Successful completion of                            the procedure was aided by using manual pressure,                            withdrawing and reinserting the scope and scope                            guide. The patient tolerated the procedure well.                            The quality of the bowel preparation was adequate                            to identify polyps. The ileocecal valve and the  rectum were photographed. Scope In: 1:34:50 PM Scope Out: 2:09:18 PM Scope Withdrawal Time: 0 hours 15 minutes 16 seconds  Total Procedure Duration: 0 hours 34 minutes 28 seconds  Findings:      The perianal and digital rectal examinations were normal.      Three polyps were found in the transverse colon and hepatic flexure. The       polyps were small in size. These polyps were removed with a cold snare.       Resection was complete, but the polyp tissue was only partially       retrieved. The pathology specimen was placed into Bottle Number 1.      The exam was otherwise normal throughout the examined colon.      External hemorrhoids were found during retroflexion. The hemorrhoids       were medium-sized. Impression:               - Three small polyps in the transverse colon and at                            the hepatic flexure, removed with a cold snare.                            Complete resection. Partial retrieval. Polyp from                             hepatic flexure was lost.                           - External hemorrhoids. Moderate Sedation:      Per Anesthesia Care Recommendation:           - Patient has a contact number available for                            emergencies. The signs and symptoms of potential                            delayed complications were discussed with the                            patient. Return to normal activities tomorrow.                            Written discharge instructions were provided to the                            patient.                           - Resume previous diet today.                           - Continue present medications.                           - No aspirin, ibuprofen, naproxen, or other  non-steroidal anti-inflammatory drugs for 1 day.                           - Await pathology results.                           - Repeat colonoscopy in 5 years for surveillance. Procedure Code(s):        --- Professional ---                           [phone], Colonoscopy, flexible; with removal of                            tumor(s), polyp(s), or other lesion(s) by snare                            technique Diagnosis Code(s):        --- Professional ---                           Z86.010, Personal history of colonic polyps                           K63.5, Polyp of colon                           K64.4, Residual hemorrhoidal skin tags CPT copyright 2019 American Medical Association. All rights reserved. The codes documented in this report are preliminary and upon coder review may  be revised to meet current compliance requirements. Hildred Laser, MD Hildred Laser, MD 11/13/2021 2:23:00 PM This report has been signed electronically. Number of Addenda: 0

## 2021-11-13 NOTE — Anesthesia Procedure Notes (Signed)
Date/Time: 11/13/2021 1:35 PM Performed by: Orlie Dakin, CRNA Pre-anesthesia Checklist: Patient identified, Emergency Drugs available, Suction available and Patient being monitored Patient Re-evaluated:Patient Re-evaluated prior to induction Oxygen Delivery Method: Nasal cannula Induction Type: IV induction Placement Confirmation: positive ETCO2

## 2021-11-13 NOTE — Anesthesia Postprocedure Evaluation (Signed)
Anesthesia Post Note  Patient: Lawrence [name]  Procedure(s) Performed: COLONOSCOPY WITH PROPOFOL POLYPECTOMY INTESTINAL  Patient location during evaluation: Phase II Anesthesia Type: General Level of consciousness: awake and alert and oriented Pain management: pain level controlled Vital Signs Assessment: post-procedure vital signs reviewed and stable Respiratory status: spontaneous breathing, nonlabored ventilation and respiratory function stable Cardiovascular status: blood pressure returned to baseline and stable Postop Assessment: no apparent nausea or vomiting Anesthetic complications: no   No notable events documented.   Last Vitals:  Vitals:   11/13/21 1241 11/13/21 1414  BP: (!) 146/71 (!) 101/54  Pulse:  78  Resp: 16 16  Temp:  36.7 C  SpO2: 100% 100%    Last Pain:  Vitals:   11/13/21 1414  TempSrc: Oral  PainSc: 0-No pain                 Leondro Coryell C Aarib Pulido

## 2021-11-13 NOTE — Discharge Instructions (Addendum)
Resume aspirin on 11/14/2021 Resume other medications as before. Modified carb diet No driving for 24 hours. Physician will call with biopsy results.

## 2021-11-13 NOTE — Transfer of Care (Signed)
Immediate Anesthesia Transfer of Care Note  Patient: Lawrence [name]  Procedure(s) Performed: COLONOSCOPY WITH PROPOFOL POLYPECTOMY INTESTINAL  Patient Location: Short Stay  Anesthesia Type:General  Level of Consciousness: awake  Airway & Oxygen Therapy: Patient Spontanous Breathing  Post-op Assessment: Report given to RN and Post -op Vital signs reviewed and stable  Post vital signs: Reviewed and stable  Last Vitals:  Vitals Value Taken Time  BP    Temp    Pulse    Resp    SpO2      Last Pain:  Vitals:   11/13/21 1330  TempSrc:   PainSc: 0-No pain      Patients Stated Pain Goal: 8 (83/66/29 [id number])  Complications: No notable events documented.

## 2021-11-13 NOTE — Anesthesia Preprocedure Evaluation (Signed)
Anesthesia Evaluation  Patient identified by MRN, date of birth, ID band Patient awake    Reviewed: Allergy & Precautions, NPO status , Patient's Chart, lab work & pertinent test results  Airway Mallampati: II  TM Distance: >3 FB Neck ROM: Full   Comment: Neck sx Dental  (+) Dental Advisory Given No notable dental injury:   Pulmonary sleep apnea ,    Pulmonary exam normal breath sounds clear to auscultation       Cardiovascular Exercise Tolerance: Good hypertension, Pt. on medications Normal cardiovascular exam Rhythm:Regular Rate:Normal     Neuro/Psych negative neurological ROS  negative psych ROS   GI/Hepatic Neg liver ROS, GERD  Medicated and Controlled,  Endo/Other  negative endocrine ROSdiabetes, Well Controlled, Type 2, Oral Hypoglycemic Agents  Renal/GU negative Renal ROS  negative genitourinary   Musculoskeletal  (+) Arthritis , Osteoarthritis,    Abdominal   Peds negative pediatric ROS (+)  Hematology negative hematology ROS (+)   Anesthesia Other Findings Neck sx, back sx  Reproductive/Obstetrics negative OB ROS                            Anesthesia Physical Anesthesia Plan  ASA: 2  Anesthesia Plan: General   Post-op Pain Management: Minimal or no pain anticipated   Induction: Intravenous  PONV Risk Score and Plan: TIVA  Airway Management Planned: Nasal Cannula and Natural Airway  Additional Equipment:   Intra-op Plan:   Post-operative Plan:   Informed Consent: I have reviewed the patients History and Physical, chart, labs and discussed the procedure including the risks, benefits and alternatives for the proposed anesthesia with the patient or authorized representative who has indicated his/her understanding and acceptance.     Dental advisory given  Plan Discussed with: CRNA and Surgeon  Anesthesia Plan Comments:         Anesthesia Quick  Evaluation

## 2021-11-13 NOTE — H&P (Signed)
Lawrence [name] is an 76 y.o. male.   Chief Complaint: Patient is here for colonoscopy. HPI: Patient is 76 year old African-American male who is here for surveillance colonoscopy.  He has history of colonic polyps.  His last exam was in June 2016 with removal of 4 polyps.  3 were tubular adenomas.  He denies abdominal pain change in bowel habits or rectal bleeding. Last aspirin dose was 2 days ago. Family history is negative for colorectal carcinoma.  Past Medical History:  Diagnosis Date   Arthritis    Diabetes mellitus without complication (HCC)    GERD (gastroesophageal reflux disease)    Hypertension    Sleep apnea     Past Surgical History:  Procedure Laterality Date   BACK SURGERY     COLONOSCOPY N/A 02/21/2015   Procedure: COLONOSCOPY;  Surgeon: Rogene Houston, MD;  [facility];  Service: Endoscopy;  Laterality: N/A;  1025   LEFT HEART CATH AND CORONARY ANGIOGRAPHY N/A 12/23/2020   Procedure: LEFT HEART CATH AND CORONARY ANGIOGRAPHY;  Surgeon: Troy Sine, MD;  Location: [facility];  Service: Cardiovascular;  Laterality: N/A;   NECK SURGERY     SHOULDER SURGERY      History reviewed. No pertinent family history. Social History:  reports that he has never smoked. He has never used smokeless tobacco. He reports that he does not drink alcohol and does not use drugs.  Allergies:  Allergies  Allergen Reactions   Penicillins Hives   Ranibizumab Rash    Medications Prior to Admission  Medication Sig Dispense Refill   aspirin EC 81 MG tablet Take 81 mg by mouth every other day.     Barberry-Oreg Grape-Goldenseal (BERBERINE COMPLEX PO) Take 1 capsule by mouth daily.     Calcium Carb-Cholecalciferol (CALCIUM + D3 PO) Take 1 tablet by mouth daily.     Camphor-Menthol-Methyl Sal (SALONPAS) 3.09-27-08 % PTCH Apply 1 patch topically daily.     Cinnamon 500 MG capsule Take 500 mg by mouth daily.     colchicine 0.6 MG tablet Take 1 tablet (0.6 mg total) by mouth 2  (two) times daily. 60 tablet 3   hydrochlorothiazide (HYDRODIURIL) 25 MG tablet Take 25 mg by mouth daily.     LEVEMIR FLEXTOUCH 100 UNIT/ML FlexTouch Pen Inject 6 Units into the skin at bedtime.     metFORMIN (GLUCOPHAGE-XR) 500 MG 24 hr tablet Take 500 mg by mouth 2 (two) times daily.     Multiple Vitamins-Minerals (CENTRUM) tablet Take 1 tablet by mouth daily. 50 +     OVER THE COUNTER MEDICATION Take 1 tablet by mouth daily. Super Beta prostate formula     OVER THE COUNTER MEDICATION Take 1 capsule by mouth daily. vision essentials     tadalafil (CIALIS) 5 MG tablet Take 1 tablet (5 mg total) by mouth daily as needed for erectile dysfunction. 30 tablet 11   vitamin E 180 MG (400 UNITS) capsule Take 400 Units by mouth daily.     Vitamin Mixture (ESTER-C PO) Take 1,000 mg by mouth daily.      Results for orders placed or performed during the hospital encounter of 11/13/21 (from the past 48 hour(s))  Glucose, capillary     Status: Abnormal   Collection Time: 11/13/21 12:43 PM  Result Value Ref Range   Glucose-Capillary 119 (H) 70 - 99 mg/dL    Comment: Glucose reference range applies only to samples taken after fasting for at least 8 hours.   No  results found.  Review of Systems  Blood pressure (!) 146/71, pulse 88, temperature 97.9 F (36.6 C), temperature source Oral, resp. rate 16, height 6\' 1"  (1.854 m), weight 83 kg, SpO2 100 %. Physical Exam HENT:     Mouth/Throat:     Mouth: Mucous membranes are moist.     Pharynx: Oropharynx is clear.  Eyes:     General: No scleral icterus.    Conjunctiva/sclera: Conjunctivae normal.  Cardiovascular:     Rate and Rhythm: Normal rate and regular rhythm.     Heart sounds: Normal heart sounds. No murmur heard. Pulmonary:     Effort: Pulmonary effort is normal.     Breath sounds: Normal breath sounds.  Abdominal:     General: There is no distension.     Palpations: Abdomen is soft. There is no mass.     Tenderness: There is no abdominal  tenderness.  Musculoskeletal:        General: Swelling present.     Cervical back: Neck supple.     Comments: Trace edema around right ankle.  Lymphadenopathy:     Cervical: No cervical adenopathy.  Skin:    General: Skin is warm and dry.  Neurological:     Mental Status: He is alert.     Assessment/Plan  History of colonic adenomas. Surveillance colonoscopy.  Hildred Laser, MD 11/13/2021, 1:24 PM

## 2021-11-15 ENCOUNTER — Ambulatory Visit: Payer: Medicare HMO | Admitting: Nurse Practitioner

## 2021-11-18 LAB — SURGICAL PATHOLOGY

## 2021-11-19 ENCOUNTER — Encounter: Payer: Self-pay | Admitting: "Endocrinology

## 2021-11-19 ENCOUNTER — Ambulatory Visit: Payer: Medicare HMO | Admitting: "Endocrinology

## 2021-11-19 ENCOUNTER — Telehealth (INDEPENDENT_AMBULATORY_CARE_PROVIDER_SITE_OTHER): Payer: Self-pay | Admitting: *Deleted

## 2021-11-19 VITALS — BP 149/71 | HR 90 | Ht 71.0 in | Wt 187.0 lb

## 2021-11-19 DIAGNOSIS — E1165 Type 2 diabetes mellitus with hyperglycemia: Secondary | ICD-10-CM

## 2021-11-19 DIAGNOSIS — I10 Essential (primary) hypertension: Secondary | ICD-10-CM

## 2021-11-19 DIAGNOSIS — Z794 Long term (current) use of insulin: Secondary | ICD-10-CM | POA: Diagnosis not present

## 2021-11-19 LAB — POCT GLYCOSYLATED HEMOGLOBIN (HGB A1C): HbA1c, POC (controlled diabetic range): 10.2 % — AB (ref 0.0–7.0)

## 2021-11-19 NOTE — Patient Instructions (Signed)

## 2021-11-19 NOTE — Progress Notes (Signed)
11/19/2021, 4:06 PM  Endocrinology follow-up note   Subjective:    Patient ID: Lawrence [name], male    DOB: 1946-09-17.  Lawrence [name] is being seen in follow-up after he was seen in consultation for management of currently uncontrolled symptomatic diabetes requested by  [place] Nation, MD.   Past Medical History:  Diagnosis Date   Arthritis    Diabetes mellitus without complication ([place])    GERD (gastroesophageal reflux disease)    Hypertension    Sleep apnea     Past Surgical History:  Procedure Laterality Date   BACK SURGERY     COLONOSCOPY N/A 02/21/2015   Procedure: COLONOSCOPY;  Surgeon: Rogene Houston, MD;  [facility];  Service: Endoscopy;  Laterality: N/A;  1025   COLONOSCOPY  11/13/2021   COLONOSCOPY WITH PROPOFOL N/A 11/13/2021   Procedure: COLONOSCOPY WITH PROPOFOL;  Surgeon: Rogene Houston, MD;  [facility];  Service: Endoscopy;  Laterality: N/A;  155   LEFT HEART CATH AND CORONARY ANGIOGRAPHY N/A 12/23/2020   Procedure: LEFT HEART CATH AND CORONARY ANGIOGRAPHY;  Surgeon: Troy Sine, MD;  Location: [facility];  Service: Cardiovascular;  Laterality: N/A;   NECK SURGERY     POLYPECTOMY  11/13/2021   Procedure: POLYPECTOMY INTESTINAL;  Surgeon: Rogene Houston, MD;  [facility];  Service: Endoscopy;;   SHOULDER SURGERY      Social History   Socioeconomic History   Marital status: Married    Spouse name: Not on file   Number of children: 3   Years of education: Not on file   Highest education level: Not on file  Occupational History   Occupation: retired  Tobacco Use   Smoking status: Never   Smokeless tobacco: Never  Vaping Use   Vaping Use: Never used  Substance and Sexual Activity   Alcohol use: No   Drug use: No   Sexual activity: Not on file  Other Topics Concern   Not on file  Social History Narrative   Not on file    Social Determinants of Health   Financial Resource Strain: Not on file  Food Insecurity: Not on file  Transportation Needs: Not on file  Physical Activity: Not on file  Stress: Not on file  Social Connections: Not on file    History reviewed. No pertinent family history.  Outpatient Encounter Medications as of 11/19/2021  Medication Sig   aspirin EC 81 MG tablet Take 1 tablet (81 mg total) by mouth every other day.   Barberry-Oreg Grape-Goldenseal (BERBERINE COMPLEX PO) Take 1 capsule by mouth daily.   Calcium Carb-Cholecalciferol (CALCIUM + D3 PO) Take 1 tablet by mouth daily.   Camphor-Menthol-Methyl Sal (SALONPAS) 3.09-27-08 % PTCH Apply 1 patch topically daily.   Cinnamon 500 MG capsule Take 500 mg by mouth daily.   colchicine 0.6 MG tablet Take 1 tablet (0.6 mg total) by mouth 2 (two) times daily.   hydrochlorothiazide (HYDRODIURIL) 25 MG tablet Take 25 mg by mouth daily.   LEVEMIR FLEXTOUCH 100 UNIT/ML FlexTouch Pen Inject 10 Units into the skin at bedtime.   Multiple Vitamins-Minerals (CENTRUM) tablet Take 1 tablet by mouth daily.  50 +   OVER THE COUNTER MEDICATION Take 1 tablet by mouth daily. Super Beta prostate formula   OVER THE COUNTER MEDICATION Take 1 capsule by mouth daily. vision essentials   tadalafil (CIALIS) 5 MG tablet Take 1 tablet (5 mg total) by mouth daily as needed for erectile dysfunction.   vitamin E 180 MG (400 UNITS) capsule Take 400 Units by mouth daily.   Vitamin Mixture (ESTER-C PO) Take 1,000 mg by mouth daily.   [DISCONTINUED] metFORMIN (GLUCOPHAGE-XR) 500 MG 24 hr tablet Take 500 mg by mouth 2 (two) times daily. (Patient not taking: Reported on 11/19/2021)   No facility-administered encounter medications on file as of 11/19/2021.    ALLERGIES: Allergies  Allergen Reactions   Penicillins Hives   Ranibizumab Rash    VACCINATION STATUS:  There is no immunization history on file for this patient.  Diabetes He presents for his follow-up  diabetic visit. He has type 2 diabetes mellitus. Onset time: Diagnosed at approx age of 65. His disease course has been improving. There are no hypoglycemic associated symptoms. There are no diabetic associated symptoms. There are no hypoglycemic complications. Symptoms are stable. Diabetic complications include retinopathy (mild per his report of recent eye exam). Risk factors for coronary artery disease include diabetes mellitus, hypertension and male sex. Current diabetic treatment includes insulin injections, diet and oral agent (monotherapy). He is compliant with treatment most of the time. His weight is fluctuating minimally. He is following a generally healthy, vegetarian and diabetic diet. Meal planning includes avoidance of concentrated sweets and ADA exchanges. He has not had a previous visit with a dietitian. He participates in exercise intermittently. His home blood glucose trend is decreasing steadily. His breakfast blood glucose range is generally 130-140 mg/dl. His lunch blood glucose range is generally 130-140 mg/dl. His dinner blood glucose range is generally 130-140 mg/dl. His bedtime blood glucose range is generally 130-140 mg/dl. His overall blood glucose range is 130-140 mg/dl. (He resents with his logs showing controlled glycemic profile to near target levels.  His point-of-care A1c is 10.2%, improving from 11.9%.  He did not document hypoglycemia.  Due to bowel prep for colonoscopy, he was not tolerating metformin causing some GI upset.  He remains on low-dose Levemir. ) An ACE inhibitor/angiotensin II receptor blocker is not being taken. He sees a podiatrist.Eye exam is current.  Hypertension This is a chronic problem. The current episode started more than 1 year ago. The problem is unchanged. The problem is controlled. There are no associated agents to hypertension. Risk factors for coronary artery disease include diabetes mellitus and male gender. Past treatments include diuretics. The  current treatment provides mild improvement. There are no compliance problems.  Hypertensive end-organ damage includes retinopathy (mild per his report of recent eye exam).    Review of systems  Constitutional: + Minimally fluctuating body weight, current Body mass index is 26.08 kg/m., no fatigue, no subjective hyperthermia, no subjective hypothermia Eyes: no blurry vision, no xerophthalmia ENT: no sore throat, no nodules palpated in throat, no dysphagia/odynophagia, no hoarseness Cardiovascular: no chest pain, no shortness of breath, no palpitations, no leg swelling Respiratory: no cough, no shortness of breath Gastrointestinal: +  nausea/vomiting/diarrhea Musculoskeletal: no muscle/joint aches Skin: no rashes, no hyperemia Neurological: no tremors, no numbness, no tingling, no dizziness Psychiatric: no depression, no anxiety  Objective:     BP (!) 149/71    Pulse 90    Ht 5\' 11"  (1.803 m)    Wt 187 lb (84.8 kg)  SpO2 100%    BMI 26.08 kg/m   Wt Readings from Last 3 Encounters:  11/19/21 187 lb (84.8 kg)  11/13/21 183 lb (83 kg)  11/11/21 183 lb (83 kg)     BP Readings from Last 3 Encounters:  11/19/21 (!) 149/71  11/13/21 (!) 101/54  11/11/21 (!) 144/62     Physical Exam- Limited  Constitutional:  Body mass index is 26.08 kg/m. , not in acute distress, normal state of mind    CMP ( most recent) CMP     Component Value Date/Time   NA 136 11/11/2021 1304   K 3.5 11/11/2021 1304   CL 97 (L) 11/11/2021 1304   CO2 32 11/11/2021 1304   GLUCOSE 351 (H) 11/11/2021 1304   BUN 16 11/11/2021 1304   BUN 18 02/21/2021 0000   CREATININE 0.92 11/11/2021 1304   CALCIUM 9.2 11/11/2021 1304   GFRNONAA >60 11/11/2021 1304   GFRAA 65 02/21/2021 0000     Diabetic Labs (most recent): Lab Results  Component Value Date   HGBA1C 10.2 (A) 11/19/2021   HGBA1C 11.9 07/23/2021   HGBA1C 10.4 (H) 12/23/2020     Lipid Panel ( most recent) Lipid Panel     Component Value  Date/Time   CHOL 130 12/24/2020 0629   TRIG 72 12/24/2020 0629   HDL 55 12/24/2020 0629   CHOLHDL 2.4 12/24/2020 0629   VLDL 14 12/24/2020 0629   LDLCALC 61 12/24/2020 0629      Lab Results  Component Value Date   TSH 1.46 10/27/2020           Assessment & Plan:   1) Type 2 diabetes mellitus with hyperglycemia, with long-term current use of insulin ([place]) He resents with his logs showing controlled glycemic profile to near target levels.  His point-of-care A1c is 10.2%, improving from 11.9%.  He did not document hypoglycemia.  Due to bowel prep for colonoscopy, he was not tolerating metformin causing some GI upset.  He remains on low-dose Levemir.  -Recent labs reviewed.  - I had a long discussion with him about the progressive nature of diabetes and the pathology behind its complications. -his diabetes is complicated by mild retinopathy and he remains at a high risk for more acute and chronic complications which include CAD, CVA, CKD, retinopathy, and neuropathy. These are all discussed in detail with him.  He will benefit the most from lifestyle medicine.  - he acknowledges that there is a room for improvement in his food and drink choices. - Suggestion is made for him to avoid simple carbohydrates  from his diet including Cakes, Sweet Desserts, Ice Cream, Soda (diet and regular), Sweet Tea, Candies, Chips, Cookies, Store Bought Juices, Alcohol , Artificial Sweeteners,  Coffee Creamer, and "Sugar-free" Products, Lemonade. This will help patient to have more stable blood glucose profile and potentially avoid unintended weight gain.  The following Lifestyle Medicine recommendations according to [place]  [facility]) were discussed and and offered to patient and he  agrees to start the journey:  A. Whole Foods, Plant-Based Nutrition comprising of fruits and vegetables, plant-based proteins, whole-grain carbohydrates was discussed in detail with the patient.    A list for source of those nutrients were also provided to the patient.  Patient will use only water or unsweetened tea for hydration. B.  The need to stay away from risky substances including alcohol, smoking; obtaining 7 to 9 hours of restorative sleep, at least 150 minutes of moderate intensity  exercise weekly, the importance of healthy social connections,  and stress management techniques were discussed. C.  A full color page of  Calorie density of various food groups per pound showing examples of each food groups was provided to the patient.    - I have approached him with the following individualized plan to manage his diabetes and patient agrees:   -He is encouraged to come off of his metformin for now.  This treatment may be restarted after his GI symptoms resolved.  In the meantime, he is advised to continue Levemir 10 units at bedtime, associated with monitoring of blood glucose twice a day-daily before breakfast and at bedtime.   - he is encouraged to call clinic for blood glucose levels less than 70 or above 200 mg /dl. - he will be considered for incretin therapy as appropriate next visit. -He will be helped with his first application of Dexcom. - Specific targets for  A1c; LDL, HDL, and Triglycerides were discussed with the patient.  2) Blood Pressure /Hypertension: -His blood pressure is not controlled to target.  he is advised to continue his current medications including HCTZ 25 mg p.o. daily with breakfast.  3) Lipids/Hyperlipidemia:    Review of his recent lipid panel showed controlled LDL at 61.  He is not on statins.    He is managing with healthy diet and exercise at this time.   4)  Weight/Diet:  his Body mass index is 26.08 kg/m.  - he is not a candidate for major weight loss.  Exercise, and detailed carbohydrates information provided  -  detailed on discharge instructions.  5) Chronic Care/Health Maintenance: -he is not on ACEI/ARB or Statin medications and is  encouraged to initiate and continue to follow up with Ophthalmology, Dentist, Podiatrist at least yearly or according to recommendations, and advised to stay away from smoking. I have recommended yearly flu vaccine and pneumonia vaccine at least every 5 years; moderate intensity exercise for up to 150 minutes weekly; and sleep for at least 7 hours a day.  - he is advised to maintain close follow up with [place] Nation, MD for primary care needs, as well as his other providers for optimal and coordinated care.   I spent 41 minutes in the care of the patient today including review of labs from [facility], Lipids, Thyroid Function, Hematology (current and previous including abstractions from other facilities); face-to-face time discussing  his blood glucose readings/logs, discussing hypoglycemia and hyperglycemia episodes and symptoms, medications doses, his options of short and long term treatment based on the latest standards of care / guidelines;  discussion about incorporating lifestyle medicine;  and documenting the encounter.    Please refer to Patient Instructions for Blood Glucose Monitoring and Insulin/Medications Dosing Guide"  in media tab for additional information. Please  also refer to " Patient Self Inventory" in the Media  tab for reviewed elements of pertinent patient history.  Josefina Do participated in the discussions, expressed understanding, and voiced agreement with the above plans.  All questions were answered to his satisfaction. he is encouraged to contact clinic should he have any questions or concerns prior to his return visit.   Follow up plan: - Return in about 3 months (around 02/16/2022) for Bring Meter and Logs- A1c in Office.    Rayetta Pigg, [facility] [address] [place], [place] [address] Phone: [phone] Fax: [phone]  11/19/2021, 4:07 PM

## 2021-11-19 NOTE — Telephone Encounter (Signed)
I called and left a message on home number asked that the patient return call to the office. I tried three times to call the patient on his cell number no answer and vm is not set up to leave a message.

## 2021-11-19 NOTE — Telephone Encounter (Signed)
Patient stopped by office during lunch - stated he had colonoscopy last Wednesday and had been sick ever since running fever and not going to bathroom. His bowels finally moved on Sunday evening after drinking something. Wants to know what to do - please call  Cell# [phone] Home#: [phone]

## 2021-11-22 NOTE — Telephone Encounter (Signed)
Called patient back since we never heard back from him. He states Dr.Rehman called him and gave him recommendations and he is doing a lot better now and will follow up if any problems.  ?

## 2022-02-25 ENCOUNTER — Encounter: Payer: Medicare HMO | Admitting: Nurse Practitioner

## 2022-02-25 VITALS — Ht 71.0 in

## 2022-02-25 NOTE — Progress Notes (Signed)
Erroneous encounter

## 2022-02-25 NOTE — Patient Instructions (Signed)
Diabetes Mellitus and Foot Care Foot care is an important part of your health, especially when you have diabetes. Diabetes may cause you to have problems because of poor blood flow (circulation) to your feet and legs, which can cause your skin to: Become thinner and drier. Break more easily. Heal more slowly. Peel and crack. You may also have nerve damage (neuropathy) in your legs and feet, causing decreased feeling in them. This means that you may not notice minor injuries to your feet that could lead to more serious problems. Noticing and addressing any potential problems early is the best way to prevent future foot problems. How to care for your feet Foot hygiene  Wash your feet daily with warm water and mild soap. Do not use hot water. Then, pat your feet and the areas between your toes until they are completely dry. Do not soak your feet as this can dry your skin. Trim your toenails straight across. Do not dig under them or around the cuticle. File the edges of your nails with an emery board or nail file. Apply a moisturizing lotion or petroleum jelly to the skin on your feet and to dry, brittle toenails. Use lotion that does not contain alcohol and is unscented. Do not apply lotion between your toes. Shoes and socks Wear clean socks or stockings every day. Make sure they are not too tight. Do not wear knee-high stockings since they may decrease blood flow to your legs. Wear shoes that fit properly and have enough cushioning. Always look in your shoes before you put them on to be sure there are no objects inside. To break in new shoes, wear them for just a few hours a day. This prevents injuries on your feet. Wounds, scrapes, corns, and calluses  Check your feet daily for blisters, cuts, bruises, sores, and redness. If you cannot see the bottom of your feet, use a mirror or ask someone for help. Do not cut corns or calluses or try to remove them with medicine. If you find a minor scrape,  cut, or break in the skin on your feet, keep it and the skin around it clean and dry. You may clean these areas with mild soap and water. Do not clean the area with peroxide, alcohol, or iodine. If you have a wound, scrape, corn, or callus on your foot, look at it several times a day to make sure it is healing and not infected. Check for: Redness, swelling, or pain. Fluid or blood. Warmth. Pus or a bad smell. General tips Do not cross your legs. This may decrease blood flow to your feet. Do not use heating pads or hot water bottles on your feet. They may burn your skin. If you have lost feeling in your feet or legs, you may not know this is happening until it is too late. Protect your feet from hot and cold by wearing shoes, such as at the beach or on hot pavement. Schedule a complete foot exam at least once a year (annually) or more often if you have foot problems. Report any cuts, sores, or bruises to your health care provider immediately. Where to find more information American Diabetes Association: www.diabetes.org Association of Diabetes Care & Education Specialists: www.diabeteseducator.org Contact a health care provider if: You have a medical condition that increases your risk of infection and you have any cuts, sores, or bruises on your feet. You have an injury that is not healing. You have redness on your legs or feet. You   feel burning or tingling in your legs or feet. You have pain or cramps in your legs and feet. Your legs or feet are numb. Your feet always feel cold. You have pain around any toenails. Get help right away if: You have a wound, scrape, corn, or callus on your foot and: You have pain, swelling, or redness that gets worse. You have fluid or blood coming from the wound, scrape, corn, or callus. Your wound, scrape, corn, or callus feels warm to the touch. You have pus or a bad smell coming from the wound, scrape, corn, or callus. You have a fever. You have a red  line going up your leg. Summary Check your feet every day for blisters, cuts, bruises, sores, and redness. Apply a moisturizing lotion or petroleum jelly to the skin on your feet and to dry, brittle toenails. Wear shoes that fit properly and have enough cushioning. If you have foot problems, report any cuts, sores, or bruises to your health care provider immediately. Schedule a complete foot exam at least once a year (annually) or more often if you have foot problems. This information is not intended to replace advice given to you by your health care provider. Make sure you discuss any questions you have with your health care provider. Document Revised: 03/29/2020 Document Reviewed: 03/29/2020 Elsevier Patient Education  2023 [employer]  

## 2022-03-18 ENCOUNTER — Encounter: Payer: Self-pay | Admitting: Nurse Practitioner

## 2022-03-18 ENCOUNTER — Ambulatory Visit (INDEPENDENT_AMBULATORY_CARE_PROVIDER_SITE_OTHER): Payer: Medicare HMO | Admitting: Nurse Practitioner

## 2022-03-18 ENCOUNTER — Ambulatory Visit: Payer: Medicare HMO | Admitting: Nurse Practitioner

## 2022-03-18 VITALS — BP 134/77 | HR 89 | Ht 71.0 in | Wt 182.0 lb

## 2022-03-18 DIAGNOSIS — Z794 Long term (current) use of insulin: Secondary | ICD-10-CM

## 2022-03-18 DIAGNOSIS — E1165 Type 2 diabetes mellitus with hyperglycemia: Secondary | ICD-10-CM

## 2022-03-18 LAB — POCT GLYCOSYLATED HEMOGLOBIN (HGB A1C): HbA1c POC (<> result, manual entry): 10.8 %

## 2022-03-18 MED ORDER — TRESIBA FLEXTOUCH 100 UNIT/ML ~~LOC~~ SOPN: 10.0000 [IU] | PEN_INJECTOR | Freq: Every day | SUBCUTANEOUS | 3 refills | Status: DC

## 2022-03-18 MED ORDER — FREESTYLE LIBRE 3 SENSOR MISC: 3 refills | Status: DC

## 2022-03-18 NOTE — Progress Notes (Signed)
03/18/2022, 3:34 PM  Endocrinology follow-up note   Subjective:    Patient ID: Lawrence [name], male    DOB: 02-10-1946.  Lawrence [name] is being seen in follow-up after he was seen in consultation for management of currently uncontrolled symptomatic diabetes requested by  Donetta Potts, MD.   Past Medical History:  Diagnosis Date   Arthritis    Diabetes mellitus without complication (HCC)    GERD (gastroesophageal reflux disease)    Hypertension    Sleep apnea     Past Surgical History:  Procedure Laterality Date   BACK SURGERY     COLONOSCOPY N/A 02/21/2015   Procedure: COLONOSCOPY;  Surgeon: Malissa Hippo, MD;  Location: [facility];  Service: Endoscopy;  Laterality: N/A;  1025   COLONOSCOPY  11/13/2021   COLONOSCOPY WITH PROPOFOL N/A 11/13/2021   Procedure: COLONOSCOPY WITH PROPOFOL;  Surgeon: Malissa Hippo, MD;  Location: [facility];  Service: Endoscopy;  Laterality: N/A;  155   LEFT HEART CATH AND CORONARY ANGIOGRAPHY N/A 12/23/2020   Procedure: LEFT HEART CATH AND CORONARY ANGIOGRAPHY;  Surgeon: Lennette Bihari, MD;  Location: [facility];  Service: Cardiovascular;  Laterality: N/A;   NECK SURGERY     POLYPECTOMY  11/13/2021   Procedure: POLYPECTOMY INTESTINAL;  Surgeon: Malissa Hippo, MD;  Location: [facility];  Service: Endoscopy;;   SHOULDER SURGERY      Social History   Socioeconomic History   Marital status: Married    Spouse name: Not on file   Number of children: 3   Years of education: Not on file   Highest education level: Not on file  Occupational History   Occupation: retired  Tobacco Use   Smoking status: Never   Smokeless tobacco: Never  Vaping Use   Vaping Use: Never used  Substance and Sexual Activity   Alcohol use: No   Drug use: No   Sexual activity: Not on file  Other Topics Concern   Not on file  Social History Narrative   Not on file    Social Determinants of Health   Financial Resource Strain: Not on file  Food Insecurity: Not on file  Transportation Needs: Not on file  Physical Activity: Not on file  Stress: Not on file  Social Connections: Not on file    History reviewed. No pertinent family history.  Outpatient Encounter Medications as of 03/18/2022  Medication Sig   Continuous Blood Gluc Sensor (FREESTYLE LIBRE 3 SENSOR) MISC Place 1 sensor on the skin every 14 days. Use to check glucose continuously   insulin degludec (TRESIBA FLEXTOUCH) 100 UNIT/ML FlexTouch Pen Inject 10 Units into the skin at bedtime.   aspirin EC 81 MG tablet Take 1 tablet (81 mg total) by mouth every other day.   Barberry-Oreg Grape-Goldenseal (BERBERINE COMPLEX PO) Take 1 capsule by mouth daily.   Calcium Carb-Cholecalciferol (CALCIUM + D3 PO) Take 1 tablet by mouth daily.   Camphor-Menthol-Methyl Sal (SALONPAS) 3.09-27-08 % PTCH Apply 1 patch topically daily.   Cinnamon 500 MG capsule Take 500 mg by mouth daily.   colchicine 0.6 MG tablet Take 1 tablet (0.6 mg total) by mouth 2 (two)  times daily.   hydrochlorothiazide (HYDRODIURIL) 25 MG tablet Take 25 mg by mouth daily.   Multiple Vitamins-Minerals (CENTRUM) tablet Take 1 tablet by mouth daily. 50 +   OVER THE COUNTER MEDICATION Take 1 tablet by mouth daily. Super Beta prostate formula   OVER THE COUNTER MEDICATION Take 1 capsule by mouth daily. vision essentials   tadalafil (CIALIS) 5 MG tablet Take 1 tablet (5 mg total) by mouth daily as needed for erectile dysfunction.   vitamin E 180 MG (400 UNITS) capsule Take 400 Units by mouth daily.   Vitamin Mixture (ESTER-C PO) Take 1,000 mg by mouth daily.   [DISCONTINUED] LEVEMIR FLEXTOUCH 100 UNIT/ML FlexTouch Pen Inject 10 Units into the skin at bedtime.   No facility-administered encounter medications on file as of 03/18/2022.    ALLERGIES: Allergies  Allergen Reactions   Penicillins Hives   Ranibizumab Rash    VACCINATION  STATUS:  There is no immunization history on file for this patient.  Diabetes He presents for his follow-up diabetic visit. He has type 2 diabetes mellitus. Onset time: Diagnosed at approx age of 17. His disease course has been fluctuating. There are no hypoglycemic associated symptoms. There are no diabetic associated symptoms. There are no hypoglycemic complications. Symptoms are stable. Diabetic complications include retinopathy (mild per his report of recent eye exam). Risk factors for coronary artery disease include diabetes mellitus, hypertension and male sex. Current diabetic treatment includes insulin injections. He is compliant with treatment most of the time (ran out of insulin for 2 weeks). His weight is fluctuating minimally. He is following a generally healthy, vegetarian and diabetic diet. Meal planning includes avoidance of concentrated sweets and ADA exchanges. He has not had a previous visit with a dietitian. He participates in exercise intermittently. His breakfast blood glucose range is generally 90-110 mg/dl. His bedtime blood glucose range is generally 180-200 mg/dl. (He presents today with his logs, no meter, showing at target fasting and above target postprandial readings.  His POCT A1c today is 10.8%, increasing from last visit.  He denies any low readings.  He stopped his Metformin due to upset stomach and has been out of his Levemir for approx 2 weeks.  He also notes he had some defective pens for Levemir. ) An ACE inhibitor/angiotensin II receptor blocker is not being taken. He sees a podiatrist.Eye exam is current.  Hypertension This is a chronic problem. The current episode started more than 1 year ago. The problem is unchanged. The problem is controlled. There are no associated agents to hypertension. Risk factors for coronary artery disease include diabetes mellitus and male gender. Past treatments include diuretics. The current treatment provides mild improvement. There are no  compliance problems.  Hypertensive end-organ damage includes retinopathy (mild per his report of recent eye exam).    Review of systems  Constitutional: + Minimally fluctuating body weight,  current Body mass index is 25.38 kg/m. , no fatigue, no subjective hyperthermia, no subjective hypothermia Eyes: no blurry vision, no xerophthalmia ENT: no sore throat, no nodules palpated in throat, no dysphagia/odynophagia, no hoarseness Cardiovascular: no chest pain, no shortness of breath, no palpitations, no leg swelling Respiratory: no cough, no shortness of breath Gastrointestinal: no nausea/vomiting/diarrhea Musculoskeletal: no muscle/joint aches Skin: no rashes, no hyperemia Neurological: no tremors, no numbness, no tingling, no dizziness Psychiatric: no depression, no anxiety  Objective:     BP 134/77   Pulse 89   Ht 5\' 11"  (1.803 m)   Wt 182 lb (82.6 kg)  BMI 25.38 kg/m   Wt Readings from Last 3 Encounters:  03/18/22 182 lb (82.6 kg)  11/19/21 187 lb (84.8 kg)  11/13/21 183 lb (83 kg)     BP Readings from Last 3 Encounters:  03/18/22 134/77  11/19/21 (!) 149/71  11/13/21 (!) 101/54      Physical Exam- Limited  Constitutional:  Body mass index is 25.38 kg/m. , not in acute distress, normal state of mind Eyes:  EOMI, no exophthalmos Neck: Supple Cardiovascular: RRR, no murmurs, rubs, or gallops, no edema Respiratory: Adequate breathing efforts, no crackles, rales, rhonchi, or wheezing Musculoskeletal: no gross deformities, strength intact in all four extremities, no gross restriction of joint movements Skin:  no rashes, no hyperemia Neurological: no tremor with outstretched hands    CMP ( most recent) CMP     Component Value Date/Time   NA 136 11/11/2021 1304   K 3.5 11/11/2021 1304   CL 97 (L) 11/11/2021 1304   CO2 32 11/11/2021 1304   GLUCOSE 351 (H) 11/11/2021 1304   BUN 16 11/11/2021 1304   BUN 18 02/21/2021 0000   CREATININE 0.92 11/11/2021 1304    CALCIUM 9.2 11/11/2021 1304   GFRNONAA >60 11/11/2021 1304   GFRAA 65 02/21/2021 0000     Diabetic Labs (most recent): Lab Results  Component Value Date   HGBA1C 10.8 03/18/2022   HGBA1C 10.2 (A) 11/19/2021   HGBA1C 11.9 07/23/2021   MICROALBUR 12 10/27/2020     Lipid Panel ( most recent) Lipid Panel     Component Value Date/Time   CHOL 130 12/24/2020 0629   TRIG 72 12/24/2020 0629   HDL 55 12/24/2020 0629   CHOLHDL 2.4 12/24/2020 0629   VLDL 14 12/24/2020 0629   LDLCALC 61 12/24/2020 0629      Lab Results  Component Value Date   TSH 1.46 10/27/2020           Assessment & Plan:   1) Type 2 diabetes mellitus with hyperglycemia, with long-term current use of insulin (HCC)  He presents today with his logs, no meter, showing at target fasting and above target postprandial readings.  His POCT A1c today is 10.8%, increasing from last visit.  He denies any low readings.  He stopped his Metformin due to upset stomach and has been out of his Levemir for approx 2 weeks.  He also notes he had some defective pens for Levemir.  -Recent labs reviewed.  - I had a long discussion with him about the progressive nature of diabetes and the pathology behind its complications. -his diabetes is complicated by mild retinopathy and he remains at a high risk for more acute and chronic complications which include CAD, CVA, CKD, retinopathy, and neuropathy. These are all discussed in detail with him.  - Nutritional counseling repeated at each appointment due to patients tendency to fall back in to old habits.  - The patient admits there is a room for improvement in their diet and drink choices. -  Suggestion is made for the patient to avoid simple carbohydrates from their diet including Cakes, Sweet Desserts / Pastries, Ice Cream, Soda (diet and regular), Sweet Tea, Candies, Chips, Cookies, Sweet Pastries, Store Bought Juices, Alcohol in Excess of 1-2 drinks a day, Artificial Sweeteners,  Coffee Creamer, and "Sugar-free" Products. This will help patient to have stable blood glucose profile and potentially avoid unintended weight gain.   - I encouraged the patient to switch to unprocessed or minimally processed complex starch and increased protein intake (  animal or plant source), fruits, and vegetables.   - Patient is advised to stick to a routine mealtimes to eat 3 meals a day and avoid unnecessary snacks (to snack only to correct hypoglycemia).  The following Lifestyle Medicine recommendations according to American College of Lifestyle Medicine  [facility]) were discussed and and offered to patient and he  agrees to start the journey:  A. Whole Foods, Plant-Based Nutrition comprising of fruits and vegetables, plant-based proteins, whole-grain carbohydrates was discussed in detail with the patient.   A list for source of those nutrients were also provided to the patient.  Patient will use only water or unsweetened tea for hydration. B.  The need to stay away from risky substances including alcohol, smoking; obtaining 7 to 9 hours of restorative sleep, at least 150 minutes of moderate intensity exercise weekly, the importance of healthy social connections,  and stress management techniques were discussed. C.  A full color page of  Calorie density of various food groups per pound showing examples of each food groups was provided to the patient.  - I have approached him with the following individualized plan to manage his diabetes and patient agrees:   -He is advised to restart basal insulin but will change to Tresiba 10 units SQ nightly (since he had trouble with the Levemir pens).  He can stay off Metformin given his GI symptoms.    -He is advised to continue monitoring blood glucose twice daily, before breakfast and before bed, and to call the clinic if he has readings less than 70 or above 300 for 3 tests in a row.  He had never heard about CGM, I sent in Rx for [place] 3 to [facility] pharmacy  in [place], [place].  - he is not ideal candidate for incretin therapy due to body habitus.   - Specific targets for  A1c; LDL, HDL, and Triglycerides were discussed with the patient.  2) Blood Pressure /Hypertension: -His blood pressure is controlled to target.  he is advised to continue his current medications including HCTZ 25 mg p.o. daily with breakfast.  3) Lipids/Hyperlipidemia:    Review of his recent lipid panel showed controlled LDL at 61.  He is not on statins.    He is managing with healthy diet and exercise at this time.   4)  Weight/Diet:  his Body mass index is 25.38 kg/m.  - he is not a candidate for major weight loss.  Exercise, and detailed carbohydrates information provided  -  detailed on discharge instructions.  5) Chronic Care/Health Maintenance: -he is not on ACEI/ARB or Statin medications and is encouraged to initiate and continue to follow up with Ophthalmology, Dentist, Podiatrist at least yearly or according to recommendations, and advised to stay away from smoking. I have recommended yearly flu vaccine and pneumonia vaccine at least every 5 years; moderate intensity exercise for up to 150 minutes weekly; and sleep for at least 7 hours a day.  - he is advised to maintain close follow up with Donetta Potts, MD for primary care needs, as well as his other providers for optimal and coordinated care.     I spent 31 minutes in the care of the patient today including review of labs from CMP, Lipids, Thyroid Function, Hematology (current and previous including abstractions from other facilities); face-to-face time discussing  his blood glucose readings/logs, discussing hypoglycemia and hyperglycemia episodes and symptoms, medications doses, his options of short and long term treatment based on the latest standards of  care / guidelines;  discussion about incorporating lifestyle medicine;  and documenting the encounter.    Please refer to Patient Instructions for  Blood Glucose Monitoring and Insulin/Medications Dosing Guide"  in media tab for additional information. Please  also refer to " Patient Self Inventory" in the Media  tab for reviewed elements of pertinent patient history.  Monna Fam participated in the discussions, expressed understanding, and voiced agreement with the above plans.  All questions were answered to his satisfaction. he is encouraged to contact clinic should he have any questions or concerns prior to his return visit.   Follow up plan: - Return in about 3 months (around 06/18/2022) for Diabetes F/U with A1c in office, No previsit labs, Bring meter and logs.   Ronny Bacon, [facility] [address] [place], [place] [address] Phone: [phone] Fax: [phone]  03/18/2022, 3:34 PM

## 2022-06-24 ENCOUNTER — Encounter: Payer: Self-pay | Admitting: Nurse Practitioner

## 2022-06-24 ENCOUNTER — Ambulatory Visit (INDEPENDENT_AMBULATORY_CARE_PROVIDER_SITE_OTHER): Payer: Medicare HMO | Admitting: Nurse Practitioner

## 2022-06-24 VITALS — BP 177/74 | HR 67 | Ht 71.0 in | Wt 204.0 lb

## 2022-06-24 DIAGNOSIS — I10 Essential (primary) hypertension: Secondary | ICD-10-CM

## 2022-06-24 DIAGNOSIS — E1165 Type 2 diabetes mellitus with hyperglycemia: Secondary | ICD-10-CM

## 2022-06-24 DIAGNOSIS — Z794 Long term (current) use of insulin: Secondary | ICD-10-CM | POA: Diagnosis not present

## 2022-06-24 LAB — POCT GLYCOSYLATED HEMOGLOBIN (HGB A1C): Hemoglobin A1C: 9.8 % — AB (ref 4.0–5.6)

## 2022-06-24 MED ORDER — TRESIBA FLEXTOUCH 100 UNIT/ML ~~LOC~~ SOPN
12.0000 [IU] | PEN_INJECTOR | Freq: Every day | SUBCUTANEOUS | 3 refills | Status: DC
Start: 2022-06-24 — End: 2022-06-30

## 2022-06-24 NOTE — Progress Notes (Signed)
06/24/2022, 2:51 PM  Endocrinology follow-up note   Subjective:    Patient ID: Lawrence [name], male    DOB: 1946-08-25.  Lawrence [name] is being seen in follow-up after he was seen in consultation for management of currently uncontrolled symptomatic diabetes requested by  Akins Nation, MD.   Past Medical History:  Diagnosis Date   Arthritis    Diabetes mellitus without complication ([place])    GERD (gastroesophageal reflux disease)    Hypertension    Sleep apnea     Past Surgical History:  Procedure Laterality Date   BACK SURGERY     COLONOSCOPY N/A 02/21/2015   Procedure: COLONOSCOPY;  Surgeon: Rogene Houston, MD;  [facility]: [facility];  Service: Endoscopy;  Laterality: N/A;  1025   COLONOSCOPY  11/13/2021   COLONOSCOPY WITH PROPOFOL N/A 11/13/2021   Procedure: COLONOSCOPY WITH PROPOFOL;  Surgeon: Rogene Houston, MD;  Location: [facility];  Service: Endoscopy;  Laterality: N/A;  155   LEFT HEART CATH AND CORONARY ANGIOGRAPHY N/A 12/23/2020   Procedure: LEFT HEART CATH AND CORONARY ANGIOGRAPHY;  Surgeon: Troy Sine, MD;  Location: [facility];  Service: Cardiovascular;  Laterality: N/A;   NECK SURGERY     POLYPECTOMY  11/13/2021   Procedure: POLYPECTOMY INTESTINAL;  Surgeon: Rogene Houston, MD;  Location: [facility];  Service: Endoscopy;;   SHOULDER SURGERY      Social History   Socioeconomic History   Marital status: Married    Spouse name: Not on file   Number of children: 3   Years of education: Not on file   Highest education level: Not on file  Occupational History   Occupation: retired  Tobacco Use   Smoking status: Never   Smokeless tobacco: Never  Vaping Use   Vaping Use: Never used  Substance and Sexual Activity   Alcohol use: No   Drug use: No   Sexual activity: Not on file  Other Topics Concern   Not on file  Social History Narrative   Not on file    Social Determinants of Health   Financial Resource Strain: Not on file  Food Insecurity: Not on file  Transportation Needs: Not on file  Physical Activity: Not on file  Stress: Not on file  Social Connections: Not on file    History reviewed. No pertinent family history.  Outpatient Encounter Medications as of 06/24/2022  Medication Sig   aspirin EC 81 MG tablet Take 1 tablet (81 mg total) by mouth every other day.   Barberry-Oreg Grape-Goldenseal (BERBERINE COMPLEX PO) Take 1 capsule by mouth daily.   Calcium Carb-Cholecalciferol (CALCIUM + D3 PO) Take 1 tablet by mouth daily.   Camphor-Menthol-Methyl Sal (SALONPAS) 3.09-27-08 % PTCH Apply 1 patch topically daily.   Cinnamon 500 MG capsule Take 500 mg by mouth daily.   colchicine 0.6 MG tablet Take 1 tablet (0.6 mg total) by mouth 2 (two) times daily.   hydrochlorothiazide (HYDRODIURIL) 25 MG tablet Take 25 mg by mouth daily.   Multiple Vitamins-Minerals (CENTRUM) tablet Take 1 tablet by mouth daily. 50 +   OVER THE COUNTER MEDICATION Take 1 tablet by mouth daily. Super Beta  prostate formula   OVER THE COUNTER MEDICATION Take 1 capsule by mouth daily. vision essentials   tadalafil (CIALIS) 5 MG tablet Take 1 tablet (5 mg total) by mouth daily as needed for erectile dysfunction.   vitamin E 180 MG (400 UNITS) capsule Take 400 Units by mouth daily.   Vitamin Mixture (ESTER-C PO) Take 1,000 mg by mouth daily.   [DISCONTINUED] insulin degludec (TRESIBA FLEXTOUCH) 100 UNIT/ML FlexTouch Pen Inject 10 Units into the skin at bedtime.   insulin degludec (TRESIBA FLEXTOUCH) 100 UNIT/ML FlexTouch Pen Inject 12 Units into the skin at bedtime.   [DISCONTINUED] Continuous Blood Gluc Sensor (FREESTYLE LIBRE 3 SENSOR) MISC Place 1 sensor on the skin every 14 days. Use to check glucose continuously (Patient not taking: Reported on 06/24/2022)   No facility-administered encounter medications on file as of 06/24/2022.    ALLERGIES: Allergies   Allergen Reactions   Penicillins Hives   Ranibizumab Rash    VACCINATION STATUS:  There is no immunization history on file for this patient.  Diabetes He presents for his follow-up diabetic visit. He has type 2 diabetes mellitus. Onset time: Diagnosed at approx age of 20. His disease course has been fluctuating. There are no hypoglycemic associated symptoms. There are no diabetic associated symptoms. There are no hypoglycemic complications. Symptoms are stable. Diabetic complications include retinopathy (mild per his report of recent eye exam). Risk factors for coronary artery disease include diabetes mellitus, hypertension and male sex. Current diabetic treatment includes insulin injections. He is compliant with treatment most of the time. His weight is increasing rapidly. He is following a generally healthy, vegetarian and diabetic diet. Meal planning includes avoidance of concentrated sweets and ADA exchanges. He has not had a previous visit with a dietitian. He participates in exercise intermittently. His breakfast blood glucose range is generally 90-110 mg/dl. His bedtime blood glucose range is generally 140-180 mg/dl. (He presents today with his logs, no meter, showing at goal glycemic profile overall.  His POCT A1c today is 9.8%, improving from last visit of 10.8%.  His glucose readings do not match his A1c.  He denies any history of anemia or steroids lately.  He does have mouth infection, on antibiotics.) An ACE inhibitor/angiotensin II receptor blocker is not being taken. He sees a podiatrist.Eye exam is current.  Hypertension This is a chronic problem. The current episode started more than 1 year ago. The problem is unchanged. The problem is controlled. There are no associated agents to hypertension. Risk factors for coronary artery disease include diabetes mellitus and male gender. Past treatments include diuretics. The current treatment provides mild improvement. There are no compliance  problems.  Hypertensive end-organ damage includes retinopathy (mild per his report of recent eye exam).    Review of systems  Constitutional: + rapidly increasing body weight,  current Body mass index is 28.45 kg/m. , no fatigue, no subjective hyperthermia, no subjective hypothermia, recent mouth infection (on abx) Eyes: no blurry vision, no xerophthalmia ENT: no sore throat, no nodules palpated in throat, no dysphagia/odynophagia, no hoarseness Cardiovascular: no chest pain, no shortness of breath, no palpitations, no leg swelling Respiratory: no cough, no shortness of breath Gastrointestinal: no nausea/vomiting/diarrhea Musculoskeletal: no muscle/joint aches Skin: no rashes, no hyperemia Neurological: no tremors, no numbness, no tingling, no dizziness Psychiatric: no depression, no anxiety  Objective:     BP (!) 177/74 (BP Location: Left Arm, Patient Position: Sitting, Cuff Size: Normal) Comment: talking a medication for gum infection  Pulse 67   Ht  $'5\' 11"'g$  (1.803 m)   Wt 204 lb (92.5 kg)   BMI 28.45 kg/m   Wt Readings from Last 3 Encounters:  06/24/22 204 lb (92.5 kg)  03/18/22 182 lb (82.6 kg)  11/19/21 187 lb (84.8 kg)     BP Readings from Last 3 Encounters:  06/24/22 (!) 177/74  03/18/22 134/77  11/19/21 (!) 149/71     Physical Exam- Limited  Constitutional:  Body mass index is 28.45 kg/m. , not in acute distress, normal state of mind Eyes:  EOMI, no exophthalmos Neck: Supple Cardiovascular: RRR, no murmurs, rubs, or gallops, no edema Respiratory: Adequate breathing efforts, no crackles, rales, rhonchi, or wheezing Musculoskeletal: no gross deformities, strength intact in all four extremities, no gross restriction of joint movements Skin:  no rashes, no hyperemia Neurological: no tremor with outstretched hands    CMP ( most recent) CMP     Component Value Date/Time   NA 136 11/11/2021 1304   K 3.5 11/11/2021 1304   CL 97 (L) 11/11/2021 1304   CO2 32  11/11/2021 1304   GLUCOSE 351 (H) 11/11/2021 1304   BUN 16 11/11/2021 1304   BUN 18 02/21/2021 0000   CREATININE 0.92 11/11/2021 1304   CALCIUM 9.2 11/11/2021 1304   GFRNONAA >60 11/11/2021 1304   GFRAA 65 02/21/2021 0000     Diabetic Labs (most recent): Lab Results  Component Value Date   HGBA1C 9.8 (A) 06/24/2022   HGBA1C 10.8 03/18/2022   HGBA1C 10.2 (A) 11/19/2021   MICROALBUR 12 10/27/2020     Lipid Panel ( most recent) Lipid Panel     Component Value Date/Time   CHOL 130 12/24/2020 0629   TRIG 72 12/24/2020 0629   HDL 55 12/24/2020 0629   CHOLHDL 2.4 12/24/2020 0629   VLDL 14 12/24/2020 0629   LDLCALC 61 12/24/2020 0629      Lab Results  Component Value Date   TSH 1.46 10/27/2020           Assessment & Plan:   1) Type 2 diabetes mellitus with hyperglycemia, with long-term current use of insulin ([place])  He presents today with his logs, no meter, showing at goal glycemic profile overall.  His POCT A1c today is 9.8%, improving from last visit of 10.8%.  His glucose readings do not match his A1c.  He denies any history of anemia or steroids lately.  He does have mouth infection, on antibiotics.  -Recent labs reviewed.  - I had a long discussion with him about the progressive nature of diabetes and the pathology behind its complications. -his diabetes is complicated by mild retinopathy and he remains at a high risk for more acute and chronic complications which include CAD, CVA, CKD, retinopathy, and neuropathy. These are all discussed in detail with him.  - Nutritional counseling repeated at each appointment due to patients tendency to fall back in to old habits.  - The patient admits there is a room for improvement in their diet and drink choices. -  Suggestion is made for the patient to avoid simple carbohydrates from their diet including Cakes, Sweet Desserts / Pastries, Ice Cream, Soda (diet and regular), Sweet Tea, Candies, Chips, Cookies, Sweet Pastries,  Store Bought Juices, Alcohol in Excess of 1-2 drinks a day, Artificial Sweeteners, Coffee Creamer, and "Sugar-free" Products. This will help patient to have stable blood glucose profile and potentially avoid unintended weight gain.   - I encouraged the patient to switch to unprocessed or minimally processed complex starch and increased  protein intake (animal or plant source), fruits, and vegetables.   - Patient is advised to stick to a routine mealtimes to eat 3 meals a day and avoid unnecessary snacks (to snack only to correct hypoglycemia).  The following Lifestyle Medicine recommendations according to [facility]) were discussed and and offered to patient and he  agrees to start the journey:  A. Whole Foods, Plant-Based Nutrition comprising of fruits and vegetables, plant-based proteins, whole-grain carbohydrates was discussed in detail with the patient.   A list for source of those nutrients were also provided to the patient.  Patient will use only water or unsweetened tea for hydration. B.  The need to stay away from risky substances including alcohol, smoking; obtaining 7 to 9 hours of restorative sleep, at least 150 minutes of moderate intensity exercise weekly, the importance of healthy social connections,  and stress management techniques were discussed. C.  A full color page of  Calorie density of various food groups per pound showing examples of each food groups was provided to the patient.  - I have approached him with the following individualized plan to manage his diabetes and patient agrees:   -He is advised to increase his [place] to 12 units SQ nightly.     -He is advised to continue monitoring blood glucose twice daily, before breakfast and before bed, and to call the clinic if he has readings less than 70 or above 300 for 3 tests in a row.  He had never heard about CGM, I sent in Rx for [place] 3 to [facility] in [place], [place].  He received  the product but sent it back as he did not care for it.  - he is not ideal candidate for incretin therapy due to body habitus. He did not tolerate Metformin in the past (GI symptoms).  - Specific targets for  A1c; LDL, HDL, and Triglycerides were discussed with the patient.  2) Blood Pressure /Hypertension: -His blood pressure is controlled to target.  he is advised to continue his current medications including HCTZ 25 mg p.o. daily with breakfast.  3) Lipids/Hyperlipidemia:    Review of his recent lipid panel showed controlled LDL at 61.  He is not on statins.    He is managing with healthy diet and exercise at this time. He recently had labs done at his PCP.  I have requested copy be sent for our records.  4)  Weight/Diet:  his Body mass index is 28.45 kg/m.  - he is not a candidate for major weight loss.  Exercise, and detailed carbohydrates information provided  -  detailed on discharge instructions.  5) Chronic Care/Health Maintenance: -he is not on ACEI/ARB or Statin medications and is encouraged to initiate and continue to follow up with Ophthalmology, Dentist, Podiatrist at least yearly or according to recommendations, and advised to stay away from smoking. I have recommended yearly flu vaccine and pneumonia vaccine at least every 5 years; moderate intensity exercise for up to 150 minutes weekly; and sleep for at least 7 hours a day.  - he is advised to maintain close follow up with [place] Nation, MD for primary care needs, as well as his other providers for optimal and coordinated care.     I spent 33 minutes in the care of the patient today including review of labs from [place], Lipids, Thyroid Function, Hematology (current and previous including abstractions from other facilities); face-to-face time discussing  his blood glucose readings/logs,  discussing hypoglycemia and hyperglycemia episodes and symptoms, medications doses, his options of short and long term treatment based on  the latest standards of care / guidelines;  discussion about incorporating lifestyle medicine;  and documenting the encounter. Risk reduction counseling performed per USPSTF guidelines to reduce obesity and cardiovascular risk factors.     Please refer to Patient Instructions for Blood Glucose Monitoring and Insulin/Medications Dosing Guide"  in media tab for additional information. Please  also refer to " Patient Self Inventory" in the Media  tab for reviewed elements of pertinent patient history.  Josefina Do participated in the discussions, expressed understanding, and voiced agreement with the above plans.  All questions were answered to his satisfaction. he is encouraged to contact clinic should he have any questions or concerns prior to his return visit.   Follow up plan: - Return in about 3 months (around 09/24/2022) for Diabetes F/U with A1c in office, No previsit labs, Bring meter and logs.   Rayetta Pigg, [facility] [address] [place], [place] [address] Phone: [phone] Fax: [phone]  06/24/2022, 2:51 PM

## 2022-06-30 ENCOUNTER — Telehealth: Payer: Self-pay | Admitting: Nurse Practitioner

## 2022-06-30 MED ORDER — TRESIBA FLEXTOUCH 100 UNIT/ML ~~LOC~~ SOPN: 12.0000 [IU] | PEN_INJECTOR | Freq: Every day | SUBCUTANEOUS | 3 refills | Status: DC

## 2022-06-30 NOTE — Telephone Encounter (Signed)
I didn't know which pharmacy to send it to so I sent it to [facility]

## 2022-06-30 NOTE — Telephone Encounter (Signed)
Can you re-send this prescription. Pharmacy is stating they do not have it.  Lawrence [name] 12 units. Thanks

## 2022-09-23 ENCOUNTER — Ambulatory Visit: Payer: Medicare HMO | Admitting: Nurse Practitioner

## 2022-09-29 ENCOUNTER — Telehealth: Payer: Self-pay | Admitting: *Deleted

## 2022-09-29 NOTE — Telephone Encounter (Signed)
Called pt, no answer.

## 2022-09-29 NOTE — Telephone Encounter (Signed)
Patient states that he called last week and has not heard back from our office. He is asking if his appointment could be changed to 10/02/2022 at 11am? He was getting ready to leave his house to go out of town to pick up a son and that it would be late before he would get home. He ask that he pleased be called.

## 2022-09-30 ENCOUNTER — Ambulatory Visit: Payer: Medicare HMO | Admitting: Nurse Practitioner

## 2022-09-30 DIAGNOSIS — E1165 Type 2 diabetes mellitus with hyperglycemia: Secondary | ICD-10-CM

## 2022-09-30 DIAGNOSIS — I10 Essential (primary) hypertension: Secondary | ICD-10-CM

## 2022-10-07 ENCOUNTER — Ambulatory Visit (INDEPENDENT_AMBULATORY_CARE_PROVIDER_SITE_OTHER): Payer: Medicare HMO | Admitting: Nurse Practitioner

## 2022-10-07 ENCOUNTER — Encounter: Payer: Self-pay | Admitting: Nurse Practitioner

## 2022-10-07 VITALS — BP 131/74 | HR 81 | Ht 71.0 in | Wt 188.8 lb

## 2022-10-07 DIAGNOSIS — Z794 Long term (current) use of insulin: Secondary | ICD-10-CM | POA: Diagnosis not present

## 2022-10-07 DIAGNOSIS — I10 Essential (primary) hypertension: Secondary | ICD-10-CM

## 2022-10-07 DIAGNOSIS — E1165 Type 2 diabetes mellitus with hyperglycemia: Secondary | ICD-10-CM

## 2022-10-07 LAB — POCT GLYCOSYLATED HEMOGLOBIN (HGB A1C): Hemoglobin A1C: 12.2 % — AB (ref 4.0–5.6)

## 2022-10-07 LAB — POCT UA - MICROALBUMIN
Creatinine, POC: 200 mg/dL
Microalbumin Ur, POC: 80 mg/L

## 2022-10-07 MED ORDER — TRESIBA FLEXTOUCH 100 UNIT/ML ~~LOC~~ SOPN: 12.0000 [IU] | PEN_INJECTOR | Freq: Every day | SUBCUTANEOUS | 3 refills | Status: DC

## 2022-10-07 NOTE — Progress Notes (Signed)
10/07/2022, 4:11 PM  Endocrinology follow-up note   Subjective:    Patient ID: Lawrence [name], male    DOB: 06/08/1946.  Lawrence [name] is being seen in follow-up after he was seen in consultation for management of currently uncontrolled symptomatic diabetes requested by  Melville Nation, MD.   Past Medical History:  Diagnosis Date   Arthritis    Diabetes mellitus without complication ([place])    GERD (gastroesophageal reflux disease)    Hypertension    Sleep apnea     Past Surgical History:  Procedure Laterality Date   BACK SURGERY     COLONOSCOPY N/A 02/21/2015   Procedure: COLONOSCOPY;  Surgeon: Rogene Houston, MD;  [facility];  Service: Endoscopy;  Laterality: N/A;  1025   COLONOSCOPY  11/13/2021   COLONOSCOPY WITH PROPOFOL N/A 11/13/2021   Procedure: COLONOSCOPY WITH PROPOFOL;  Surgeon: Rogene Houston, MD;  [facility];  Service: Endoscopy;  Laterality: N/A;  155   LEFT HEART CATH AND CORONARY ANGIOGRAPHY N/A 12/23/2020   Procedure: LEFT HEART CATH AND CORONARY ANGIOGRAPHY;  Surgeon: Troy Sine, MD;  Location: [facility] [facility];  Service: Cardiovascular;  Laterality: N/A;   NECK SURGERY     POLYPECTOMY  11/13/2021   Procedure: POLYPECTOMY INTESTINAL;  Surgeon: Rogene Houston, MD;  [facility];  Service: Endoscopy;;   SHOULDER SURGERY      Social History   Socioeconomic History   Marital status: Married    Spouse name: Not on file   Number of children: 3   Years of education: Not on file   Highest education level: Not on file  Occupational History   Occupation: retired  Tobacco Use   Smoking status: Never   Smokeless tobacco: Never  Vaping Use   Vaping Use: Never used  Substance and Sexual Activity   Alcohol use: No   Drug use: No   Sexual activity: Not on file  Other Topics Concern   Not on file  Social History Narrative   Not on file    Social Determinants of Health   Financial Resource Strain: Not on file  Food Insecurity: Not on file  Transportation Needs: Not on file  Physical Activity: Not on file  Stress: Not on file  Social Connections: Not on file    History reviewed. No pertinent family history.  Outpatient Encounter Medications as of 10/07/2022  Medication Sig   aspirin EC 81 MG tablet Take 1 tablet (81 mg total) by mouth every other day.   Barberry-Oreg Grape-Goldenseal (BERBERINE COMPLEX PO) Take 1 capsule by mouth daily.   Calcium Carb-Cholecalciferol (CALCIUM + D3 PO) Take 1 tablet by mouth daily.   Camphor-Menthol-Methyl Sal (SALONPAS) 3.09-27-08 % PTCH Apply 1 patch topically daily.   Cinnamon 500 MG capsule Take 500 mg by mouth daily.   colchicine 0.6 MG tablet Take 1 tablet (0.6 mg total) by mouth 2 (two) times daily.   diphenhydrAMINE HCl (NERVINE PO) Take by mouth. Patient states that he takes one a day   hydrochlorothiazide (HYDRODIURIL) 25 MG tablet Take 25 mg by mouth daily.   Multiple Vitamins-Minerals (CENTRUM) tablet Take 1 tablet by mouth  daily. 50 +   OVER THE COUNTER MEDICATION Take 1 tablet by mouth daily. Super Beta prostate formula   OVER THE COUNTER MEDICATION Take 1 capsule by mouth daily. vision essentials   OVER THE COUNTER MEDICATION    tadalafil (CIALIS) 5 MG tablet Take 1 tablet (5 mg total) by mouth daily as needed for erectile dysfunction.   vitamin E 180 MG (400 UNITS) capsule Take 400 Units by mouth daily.   Vitamin Mixture (ESTER-C PO) Take 1,000 mg by mouth daily.   [DISCONTINUED] insulin degludec (TRESIBA FLEXTOUCH) 100 UNIT/ML FlexTouch Pen Inject 12 Units into the skin at bedtime.   insulin degludec (TRESIBA FLEXTOUCH) 100 UNIT/ML FlexTouch Pen Inject 12 Units into the skin at bedtime.   No facility-administered encounter medications on file as of 10/07/2022.    ALLERGIES: Allergies  Allergen Reactions   Penicillins Hives   Ranibizumab Rash    VACCINATION  STATUS:  There is no immunization history on file for this patient.  Diabetes He presents for his follow-up diabetic visit. He has type 2 diabetes mellitus. Onset time: Diagnosed at approx age of 55. His disease course has been fluctuating. There are no hypoglycemic associated symptoms. There are no diabetic associated symptoms. There are no hypoglycemic complications. Symptoms are stable. Diabetic complications include retinopathy (mild per his report of recent eye exam). Risk factors for coronary artery disease include diabetes mellitus, hypertension and male sex. Current diabetic treatment includes insulin injections. He is compliant with treatment most of the time. His weight is increasing rapidly. He is following a generally healthy, vegetarian and diabetic diet. Meal planning includes avoidance of concentrated sweets and ADA exchanges. He has not had a previous visit with a dietitian. He participates in exercise intermittently. His breakfast blood glucose range is generally 90-110 mg/dl. His bedtime blood glucose range is generally 140-180 mg/dl. (He presents today with his logs, no meter, showing at goal glycemic profile overall.  His POCT A1c today is 12.2%, increasing drastically from last visit of 9.8%.  He notes he developed a prostate issue which lead to a bladder infection, currently on antibiotics.  He also has history of anemia but his glucose readings and his A1c do not match up.  I did encourage him to bring his meter at his next appt so we can troubleshoot.) An ACE inhibitor/angiotensin II receptor blocker is not being taken. He sees a podiatrist.Eye exam is current.  Hypertension This is a chronic problem. The current episode started more than 1 year ago. The problem is unchanged. The problem is controlled. There are no associated agents to hypertension. Risk factors for coronary artery disease include diabetes mellitus and male gender. Past treatments include diuretics. The current  treatment provides mild improvement. There are no compliance problems.  Hypertensive end-organ damage includes retinopathy (mild per his report of recent eye exam).    Review of systems  Constitutional: + rapidly decreasing body weight,  current Body mass index is 26.33 kg/m. , no fatigue, no subjective hyperthermia, no subjective hypothermia Eyes: no blurry vision, no xerophthalmia ENT: no sore throat, no nodules palpated in throat, no dysphagia/odynophagia, no hoarseness Cardiovascular: no chest pain, no shortness of breath, no palpitations, no leg swelling Respiratory: no cough, no shortness of breath Gastrointestinal: no nausea/vomiting/diarrhea Musculoskeletal: no muscle/joint aches Skin: no rashes, no hyperemia Neurological: no tremors, no numbness, no tingling, no dizziness Psychiatric: no depression, no anxiety  Objective:     BP 131/74 (BP Location: Left Arm, Patient Position: Sitting, Cuff Size: Large)  Pulse 81   Ht '5\' 11"'$  (1.803 m)   Wt 188 lb 12.8 oz (85.6 kg)   BMI 26.33 kg/m   Wt Readings from Last 3 Encounters:  10/07/22 188 lb 12.8 oz (85.6 kg)  06/24/22 204 lb (92.5 kg)  03/18/22 182 lb (82.6 kg)     BP Readings from Last 3 Encounters:  10/07/22 131/74  06/24/22 (!) 177/74  03/18/22 134/77     Physical Exam- Limited  Constitutional:  Body mass index is 26.33 kg/m. , not in acute distress, normal state of mind Eyes:  EOMI, no exophthalmos Musculoskeletal: no gross deformities, strength intact in all four extremities, no gross restriction of joint movements Skin:  no rashes, no hyperemia Neurological: no tremor with outstretched hands    CMP ( most recent) CMP     Component Value Date/Time   NA 136 11/11/2021 1304   K 3.5 11/11/2021 1304   CL 97 (L) 11/11/2021 1304   CO2 32 11/11/2021 1304   GLUCOSE 351 (H) 11/11/2021 1304   BUN 16 11/11/2021 1304   BUN 18 02/21/2021 0000   CREATININE 0.92 11/11/2021 1304   CALCIUM 9.2 11/11/2021 1304    GFRNONAA >60 11/11/2021 1304   GFRAA 65 02/21/2021 0000     Diabetic Labs (most recent): Lab Results  Component Value Date   HGBA1C 12.2 (A) 10/07/2022   HGBA1C 9.8 (A) 06/24/2022   HGBA1C 10.8 03/18/2022   MICROALBUR 80 mg/L 10/07/2022   MICROALBUR 12 10/27/2020     Lipid Panel ( most recent) Lipid Panel     Component Value Date/Time   CHOL 130 12/24/2020 0629   TRIG 72 12/24/2020 0629   HDL 55 12/24/2020 0629   CHOLHDL 2.4 12/24/2020 0629   VLDL 14 12/24/2020 0629   LDLCALC 61 12/24/2020 0629      Lab Results  Component Value Date   TSH 1.46 10/27/2020           Assessment & Plan:   1) Type 2 diabetes mellitus with hyperglycemia, with long-term current use of insulin ([place])  He presents today with his logs, no meter, showing at goal glycemic profile overall.  His POCT A1c today is 12.2%, increasing drastically from last visit of 9.8%.  He notes he developed a prostate issue which lead to a bladder infection, currently on antibiotics.  He also has history of anemia but his glucose readings and his A1c do not match up.  I did encourage him to bring his meter at his next appt so we can troubleshoot.  -Recent labs reviewed.  - I had a long discussion with him about the progressive nature of diabetes and the pathology behind its complications. -his diabetes is complicated by mild retinopathy and he remains at a high risk for more acute and chronic complications which include CAD, CVA, CKD, retinopathy, and neuropathy. These are all discussed in detail with him.  - Nutritional counseling repeated at each appointment due to patients tendency to fall back in to old habits.  - The patient admits there is a room for improvement in their diet and drink choices. -  Suggestion is made for the patient to avoid simple carbohydrates from their diet including Cakes, Sweet Desserts / Pastries, Ice Cream, Soda (diet and regular), Sweet Tea, Candies, Chips, Cookies, Sweet Pastries,  Store Bought Juices, Alcohol in Excess of 1-2 drinks a day, Artificial Sweeteners, Coffee Creamer, and "Sugar-free" Products. This will help patient to have stable blood glucose profile and potentially avoid unintended weight  gain.   - I encouraged the patient to switch to unprocessed or minimally processed complex starch and increased protein intake (animal or plant source), fruits, and vegetables.   - Patient is advised to stick to a routine mealtimes to eat 3 meals a day and avoid unnecessary snacks (to snack only to correct hypoglycemia).  The following Lifestyle Medicine recommendations according to [place]  [facility]) were discussed and and offered to patient and he  agrees to start the journey:  A. Whole Foods, Plant-Based Nutrition comprising of fruits and vegetables, plant-based proteins, whole-grain carbohydrates was discussed in detail with the patient.   A list for source of those nutrients were also provided to the patient.  Patient will use only water or unsweetened tea for hydration. B.  The need to stay away from risky substances including alcohol, smoking; obtaining 7 to 9 hours of restorative sleep, at least 150 minutes of moderate intensity exercise weekly, the importance of healthy social connections,  and stress management techniques were discussed. C.  A full color page of  Calorie density of various food groups per pound showing examples of each food groups was provided to the patient.  - I have approached him with the following individualized plan to manage his diabetes and patient agrees:   -He is advised to continue his Tresiba to 12 units SQ nightly.   Although his A1c went up, he will not tolerate higher doses of insulin given his fasting glucose ranges between 80-110.  His glucose readings do not match that A1c.  I am encouraging him to bring his meter in addition to his logs at next visit to troubleshoot.  -He is advised to continue monitoring  blood glucose twice daily, before breakfast and before bed, and to call the clinic if he has readings less than 70 or above 300 for 3 tests in a row.  He is not interested in CGM (tried the [place] 3 but did not like it).  - he is not ideal candidate for incretin therapy due to body habitus. He did not tolerate Metformin in the past (GI symptoms).  - Specific targets for  A1c; LDL, HDL, and Triglycerides were discussed with the patient.  2) Blood Pressure /Hypertension: -His blood pressure is controlled to target.  he is advised to continue his current medications including HCTZ 25 mg p.o. daily with breakfast.  3) Lipids/Hyperlipidemia:    Review of his recent lipid panel showed controlled LDL at 61.  He is not on statins.    He is managing with healthy diet and exercise at this time. He is having labs next month with PCP, will request copy.  4)  Weight/Diet:  his Body mass index is 26.33 kg/m.  - he is not a candidate for major weight loss.  Exercise, and detailed carbohydrates information provided  -  detailed on discharge instructions.  5) Chronic Care/Health Maintenance: -he is not on ACEI/ARB or Statin medications and is encouraged to initiate and continue to follow up with Ophthalmology, Dentist, Podiatrist at least yearly or according to recommendations, and advised to stay away from smoking. I have recommended yearly flu vaccine and pneumonia vaccine at least every 5 years; moderate intensity exercise for up to 150 minutes weekly; and sleep for at least 7 hours a day.  - he is advised to maintain close follow up with [place] Nation, MD for primary care needs, as well as his other providers for optimal and coordinated care.  I spent 30 minutes in the care of the patient today including review of labs from [place], Lipids, Thyroid Function, Hematology (current and previous including abstractions from other facilities); face-to-face time discussing  his blood glucose readings/logs,  discussing hypoglycemia and hyperglycemia episodes and symptoms, medications doses, his options of short and long term treatment based on the latest standards of care / guidelines;  discussion about incorporating lifestyle medicine;  and documenting the encounter. Risk reduction counseling performed per USPSTF guidelines to reduce obesity and cardiovascular risk factors.     Please refer to Patient Instructions for Blood Glucose Monitoring and Insulin/Medications Dosing Guide"  in media tab for additional information. Please  also refer to " Patient Self Inventory" in the Media  tab for reviewed elements of pertinent patient history.  Josefina Do participated in the discussions, expressed understanding, and voiced agreement with the above plans.  All questions were answered to his satisfaction. he is encouraged to contact clinic should he have any questions or concerns prior to his return visit.   Follow up plan: - Return in about 2 months (around 12/06/2022) for Diabetes F/U, Bring meter and logs.   Rayetta Pigg, [facility] [address] [place], [place] [address] Phone: [phone] Fax: [phone]  10/07/2022, 4:11 PM

## 2022-10-30 ENCOUNTER — Ambulatory Visit (INDEPENDENT_AMBULATORY_CARE_PROVIDER_SITE_OTHER): Payer: Medicare HMO | Admitting: Urology

## 2022-10-30 ENCOUNTER — Encounter: Payer: Self-pay | Admitting: Urology

## 2022-10-30 VITALS — BP 143/75 | HR 91 | Ht 71.0 in | Wt 185.0 lb

## 2022-10-30 DIAGNOSIS — R351 Nocturia: Secondary | ICD-10-CM | POA: Diagnosis not present

## 2022-10-30 DIAGNOSIS — R3912 Poor urinary stream: Secondary | ICD-10-CM

## 2022-10-30 DIAGNOSIS — R339 Retention of urine, unspecified: Secondary | ICD-10-CM | POA: Diagnosis not present

## 2022-10-30 DIAGNOSIS — N138 Other obstructive and reflux uropathy: Secondary | ICD-10-CM

## 2022-10-30 DIAGNOSIS — N521 Erectile dysfunction due to diseases classified elsewhere: Secondary | ICD-10-CM

## 2022-10-30 DIAGNOSIS — R350 Frequency of micturition: Secondary | ICD-10-CM

## 2022-10-30 DIAGNOSIS — N401 Enlarged prostate with lower urinary tract symptoms: Secondary | ICD-10-CM

## 2022-10-30 DIAGNOSIS — E1169 Type 2 diabetes mellitus with other specified complication: Secondary | ICD-10-CM

## 2022-10-30 LAB — BLADDER SCAN AMB NON-IMAGING: Scan Result: 74

## 2022-10-30 NOTE — Progress Notes (Signed)
Subjective: 1. BPH with urinary obstruction   2. Nocturia   3. Weak urinary stream   4. Incomplete bladder emptying   5. Urinary frequency   6. 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 Jimmye Norman with [facility]  10/30/22: Lawrence [name] was seen in 3/22 for LUTS and ED and was started on daily tadalafil.  He returns now with a recent UTI a week or so ago that was treated with cefdinir and he was given tamsulosin which he started about 2 weeks ago which has helped.  His UA is clear today and a PVR is 74 ml but his IPSS is 23 with nocturia x 3.  He has a reduced stream and a sensation of incomplete emptying.   He has had no hematuria.  He is on saw palmetto. He has some interest in Urolift.  His last PSA was 0.3 in 2/22.   He is not responding to tadalafil.  HIs UA is clear today.    3/17/22[name] a 77 yo male who presents with LUTS and ED.  He had the onset of symptoms about 1.5 years ago.  He is currently on Super Beta Prostate and ProstaGenix.   He reports an elevated PSA but the level was 0.3 in 2/22.  His IPSS is 22 with frequency, nocturia x 3, a reduced stream with intermittency and a sensation of incomplete emptying.  He has no hematuria or dysuria.   He has no prior GU history or family history of prostate cancer.  He has ED for the last 2+ years that has been progressive.  He can get a partial erection.  He has no penile pain or curvature.  He has not been able to reach a climax.  He has not had treatment yet.  He is a non-smoker.  He has had diabetes for 25 yrs.  He has no neuropathy.  ROS:  ROS  Allergies  Allergen Reactions   Penicillins Hives   Ranibizumab Rash    Past Medical History:  Diagnosis Date   Arthritis    Diabetes mellitus without complication ([facility])    GERD (gastroesophageal reflux disease)    Hypertension    Sleep apnea     Past Surgical History:  Procedure Laterality Date   BACK SURGERY     COLONOSCOPY N/A  02/21/2015   Procedure: COLONOSCOPY;  Surgeon: Rogene Houston, MD;  [facility];  Service: Endoscopy;  Laterality: N/A;  1025   COLONOSCOPY  11/13/2021   COLONOSCOPY WITH PROPOFOL N/A 11/13/2021   Procedure: COLONOSCOPY WITH PROPOFOL;  Surgeon: Rogene Houston, MD;  [facility];  Service: Endoscopy;  Laterality: N/A;  155   LEFT HEART CATH AND CORONARY ANGIOGRAPHY N/A 12/23/2020   Procedure: LEFT HEART CATH AND CORONARY ANGIOGRAPHY;  Surgeon: Troy Sine, MD;  Location: [place] [facility];  Service: Cardiovascular;  Laterality: N/A;   NECK SURGERY     POLYPECTOMY  11/13/2021   Procedure: POLYPECTOMY INTESTINAL;  Surgeon: Rogene Houston, MD;  [facility];  Service: Endoscopy;;   SHOULDER SURGERY      Social History   Socioeconomic History   Marital status: Married    Spouse name: Not on file   Number of children: 3   Years of education: Not on file   Highest education level: Not on file  Occupational History   Occupation: retired  Tobacco Use   Smoking status: Never  Smokeless tobacco: Never  Vaping Use   Vaping Use: Never used  Substance and Sexual Activity   Alcohol use: No   Drug use: No   Sexual activity: Not on file  Other Topics Concern   Not on file  Social History Narrative   Not on file   Social Determinants of Health   Financial Resource Strain: Not on file  Food Insecurity: Not on file  Transportation Needs: Not on file  Physical Activity: Not on file  Stress: Not on file  Social Connections: Not on file  Intimate Partner Violence: Not on file    History reviewed. No pertinent family history.  Anti-infectives: Anti-infectives (From admission, onward)    None       Current Outpatient Medications  Medication Sig Dispense Refill   aspirin EC 81 MG tablet Take 1 tablet (81 mg total) by mouth every other day. 30 tablet 11   Barberry-Oreg Grape-Goldenseal (BERBERINE COMPLEX PO) Take 1 capsule by mouth daily.      Calcium Carb-Cholecalciferol (CALCIUM + D3 PO) Take 1 tablet by mouth daily.     Camphor-Menthol-Methyl Sal (SALONPAS) 3.09-27-08 % PTCH Apply 1 patch topically daily.     Cinnamon 500 MG capsule Take 500 mg by mouth daily.     diphenhydrAMINE HCl (NERVINE PO) Take by mouth. Patient states that he takes one a day     hydrochlorothiazide (HYDRODIURIL) 25 MG tablet Take 25 mg by mouth daily.     insulin degludec (TRESIBA FLEXTOUCH) 100 UNIT/ML FlexTouch Pen Inject 12 Units into the skin at bedtime. 9 mL 3   Multiple Vitamins-Minerals (CENTRUM) tablet Take 1 tablet by mouth daily. 50 +     OVER THE COUNTER MEDICATION Take 1 tablet by mouth daily. Super Beta prostate formula     OVER THE COUNTER MEDICATION Take 1 capsule by mouth daily. vision essentials     OVER THE COUNTER MEDICATION      saw palmetto 500 MG capsule Take 5,300 mg by mouth daily.     tadalafil (CIALIS) 5 MG tablet Take 1 tablet (5 mg total) by mouth daily as needed for erectile dysfunction. 30 tablet 11   tamsulosin (FLOMAX) 0.4 MG CAPS capsule Take 0.4 mg by mouth daily.     vitamin E 180 MG (400 UNITS) capsule Take 400 Units by mouth daily.     Vitamin Mixture (ESTER-C PO) Take 1,000 mg by mouth daily.     No current facility-administered medications for this visit.     Objective: Vital signs in last 24 hours: BP (!) 143/75   Pulse 91   Ht 5' 11"$  (1.803 m)   Wt 185 lb (83.9 kg)   BMI 25.80 kg/m   Intake/Output from previous day: No intake/output data recorded. Intake/Output this shift: @IOTHISSHIFT$ @   Physical Exam Vitals reviewed.  Constitutional:      Appearance: Normal appearance.  Abdominal:     Palpations: Abdomen is soft.     Hernia: No hernia is present.  Genitourinary:    Comments: Normal phallus with an adequate meatus. Scrotum, testes and epididymis. AP without lesions. NST without mass. Prostate 1.5+ benign. SV non-palpable.  Neurological:     Mental Status: He is alert.     Lab  Results:  Results for orders placed or performed in visit on 10/30/22 (from the past 24 hour(s))  Urinalysis, Routine w reflex microscopic     Status: Abnormal   Collection Time: 10/30/22  3:32 PM  Result Value Ref Range  Specific Gravity, UA 1.010 1.005 - 1.030   pH, UA 5.0 5.0 - 7.5   Color, UA Yellow Yellow   Appearance Ur Clear Clear   Leukocytes,UA Negative Negative   Protein,UA Negative Negative/Trace   Glucose, UA 3+ (A) Negative   Ketones, UA Negative Negative   RBC, UA Negative Negative   Bilirubin, UA Negative Negative   Urobilinogen, Ur 0.2 0.2 - 1.0 mg/dL   Nitrite, UA Negative Negative   Microscopic Examination Comment    Narrative   Performed at:  [facility] [address] [place], [place]  [id number] Lab Director: Grenelefe, Phone:  [phone]     UA is clear.  PSA was 0.3 on 10/26/20.  Studies/Results: PVR is 49m   Assessment/Plan: BPH with LUTS.   He has moderate to severe LUTS with persistent symptoms on tamsulosin.   I will  have him return in about a month for a flowrate, PVR and possible cystoscopy and I will get a prostate [place] well.    ED that is likely vascular with his long history of diabetes.  Will address when we get the LUTS sorted out.   History of UTI.  UA is clear on cefdinir.   No orders of the defined types were placed in this encounter.    Orders Placed This Encounter  Procedures   [place] Limited    Needs a transrectal prostate [place] prostate volume.    Standing Status:   Future    Standing Expiration Date:   01/28/2023    Order Specific Question:   Reason for Exam (SYMPTOM  OR DIAGNOSIS REQUIRED)    Answer:   BPH with obstruction    Order Specific Question:   Preferred imaging location?    Answer[facility]   [facility]  Urinalysis, Routine w reflex microscopic   BLADDER SCAN AMB NON-IMAGING     Return for Next available for a flowrate, PVR and cystoscopy with results of a prostate [place] .    CC:  [facility]     JIrine Seal2/05/2023 [phone]

## 2022-10-30 NOTE — Progress Notes (Signed)
post void residual =16m

## 2022-10-31 LAB — URINALYSIS, ROUTINE W REFLEX MICROSCOPIC
Bilirubin, UA: NEGATIVE
Ketones, UA: NEGATIVE
Leukocytes,UA: NEGATIVE
Nitrite, UA: NEGATIVE
Protein,UA: NEGATIVE
RBC, UA: NEGATIVE
Specific Gravity, UA: 1.01 (ref 1.005–1.030)
Urobilinogen, Ur: 0.2 mg/dL (ref 0.2–1.0)
pH, UA: 5 (ref 5.0–7.5)

## 2022-11-10 ENCOUNTER — Ambulatory Visit (HOSPITAL_COMMUNITY)
Admission: RE | Admit: 2022-11-10 | Discharge: 2022-11-10 | Disposition: A | Discharge status: Home / Self Care | Payer: Medicare HMO | Source: Ambulatory Visit | Attending: Urology | Admitting: Urology

## 2022-11-10 ENCOUNTER — Ambulatory Visit: Payer: Medicare HMO

## 2022-11-10 ENCOUNTER — Other Ambulatory Visit: Payer: Self-pay | Admitting: Urology

## 2022-11-10 DIAGNOSIS — N401 Enlarged prostate with lower urinary tract symptoms: Secondary | ICD-10-CM | POA: Insufficient documentation

## 2022-11-10 DIAGNOSIS — R350 Frequency of micturition: Secondary | ICD-10-CM

## 2022-11-10 DIAGNOSIS — E1169 Type 2 diabetes mellitus with other specified complication: Secondary | ICD-10-CM

## 2022-11-10 DIAGNOSIS — R351 Nocturia: Secondary | ICD-10-CM

## 2022-11-10 DIAGNOSIS — R339 Retention of urine, unspecified: Secondary | ICD-10-CM

## 2022-11-10 DIAGNOSIS — N138 Other obstructive and reflux uropathy: Secondary | ICD-10-CM | POA: Insufficient documentation

## 2022-11-10 DIAGNOSIS — R3912 Poor urinary stream: Secondary | ICD-10-CM

## 2022-11-11 ENCOUNTER — Ambulatory Visit (HOSPITAL_COMMUNITY): Payer: Medicare HMO

## 2022-11-20 ENCOUNTER — Encounter: Payer: Self-pay | Admitting: Urology

## 2022-11-20 ENCOUNTER — Ambulatory Visit (INDEPENDENT_AMBULATORY_CARE_PROVIDER_SITE_OTHER): Payer: Medicare HMO | Admitting: Urology

## 2022-11-20 VITALS — BP 160/78 | HR 91 | Ht 71.0 in | Wt 185.0 lb

## 2022-11-20 DIAGNOSIS — N401 Enlarged prostate with lower urinary tract symptoms: Secondary | ICD-10-CM

## 2022-11-20 DIAGNOSIS — E1169 Type 2 diabetes mellitus with other specified complication: Secondary | ICD-10-CM

## 2022-11-20 DIAGNOSIS — R3912 Poor urinary stream: Secondary | ICD-10-CM

## 2022-11-20 DIAGNOSIS — R339 Retention of urine, unspecified: Secondary | ICD-10-CM

## 2022-11-20 DIAGNOSIS — N35912 Unspecified bulbous urethral stricture, male: Secondary | ICD-10-CM

## 2022-11-20 DIAGNOSIS — N521 Erectile dysfunction due to diseases classified elsewhere: Secondary | ICD-10-CM

## 2022-11-20 DIAGNOSIS — Z87440 Personal history of urinary (tract) infections: Secondary | ICD-10-CM

## 2022-11-20 LAB — URINALYSIS, ROUTINE W REFLEX MICROSCOPIC
Bilirubin, UA: NEGATIVE
Ketones, UA: NEGATIVE
Leukocytes,UA: NEGATIVE
Nitrite, UA: NEGATIVE
Protein,UA: NEGATIVE
RBC, UA: NEGATIVE
Specific Gravity, UA: 1.015 (ref 1.005–1.030)
Urobilinogen, Ur: 0.2 mg/dL (ref 0.2–1.0)
pH, UA: 6 (ref 5.0–7.5)

## 2022-11-20 MED ORDER — CIPROFLOXACIN HCL 500 MG PO TABS
500.0000 mg | ORAL_TABLET | Freq: Once | ORAL | Status: AC
  Administered 2022-11-20: 500 mg via ORAL

## 2022-11-20 NOTE — H&P (View-Only) (Signed)
Subjective: 1. BPH with urinary obstruction   2. Weak urinary stream   3. Incomplete bladder emptying   4. 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 Erectile dysfunction associated with type 2 diabetes mellitus (HCC)      Consult requested by Dr. Williams with [facility]  11/20/22: Lawrence [name] returns today in f/u for a flowrate and possible cystoscopy.  A prostate US prior to this visit shows a 27ml prostate with a small middle lobe.  His PF is 6ml/sec with 135ml voided.  He is currently on tamulosin.  He has persistent ED.   10/30/22: Lawrence [name] was seen in 3/22 for LUTS and ED and was started on daily tadalafil.  He returns now with a recent UTI a week or so ago that was treated with cefdinir and he was given tamsulosin which he started about 2 weeks ago which has helped.  His UA is clear today and a PVR is 74 ml but his IPSS is 23 with nocturia x 3.  He has a reduced stream and a sensation of incomplete emptying.   He has had no hematuria.  He is on saw palmetto. He has some interest in Urolift.  His last PSA was 0.3 in 2/22.   He is not responding to tadalafil.  HIs UA is clear today.    12/06/20: Lawrence [name] a 77 yo male who presents with LUTS and ED.  He had the onset of symptoms about 1.5 years ago.  He is currently on Super Beta Prostate and ProstaGenix.   He reports an elevated PSA but the level was 0.3 in 2/22.  His IPSS is 22 with frequency, nocturia x 3, a reduced stream with intermittency and a sensation of incomplete emptying.  He has no hematuria or dysuria.   He has no prior GU history or family history of prostate cancer.  He has ED for the last 2+ years that has been progressive.  He can get a partial erection.  He has no penile pain or curvature.  He has not been able to reach a climax.  He has not had treatment yet.  He is a non-smoker.  He has had diabetes for 25 yrs.  He has no neuropathy.  ROS:  Review of Systems  All other systems reviewed and are  negative.   Allergies  Allergen Reactions   Penicillins Hives   Ranibizumab Rash    Past Medical History:  Diagnosis Date   Arthritis    Diabetes mellitus without complication (HCC)    GERD (gastroesophageal reflux disease)    Hypertension    Sleep apnea     Past Surgical History:  Procedure Laterality Date   BACK SURGERY     COLONOSCOPY N/A 02/21/2015   Procedure: COLONOSCOPY;  Surgeon: Najeeb U Rehman, MD;  [facility];  Service: Endoscopy;  Laterality: N/A;  1025   COLONOSCOPY  11/13/2021   COLONOSCOPY WITH PROPOFOL N/A 11/13/2021   Procedure: COLONOSCOPY WITH PROPOFOL;  Surgeon: Rehman, Najeeb U, MD;  [facility]: [facility];  Service: Endoscopy;  Laterality: N/A;  155   LEFT HEART CATH AND CORONARY ANGIOGRAPHY N/A 12/23/2020   Procedure: LEFT HEART CATH AND CORONARY ANGIOGRAPHY;  Surgeon: Kelly, Thomas A, MD;  [facility];  Service: Cardiovascular;  Laterality: N/A;   NECK SURGERY     POLYPECTOMY  11/13/2021   Procedure: POLYPECTOMY INTESTINAL;  Surgeon: Rehman, Najeeb U, MD;  [facility]: [facility];  Service: Endoscopy;;   SHOULDER   SURGERY      Social History   Socioeconomic History   Marital status: Married    Spouse name: Not on file   Number of children: 3   Years of education: Not on file   Highest education level: Not on file  Occupational History   Occupation: retired  Tobacco Use   Smoking status: Never   Smokeless tobacco: Never  Vaping Use   Vaping Use: Never used  Substance and Sexual Activity   Alcohol use: No   Drug use: No   Sexual activity: Not on file  Other Topics Concern   Not on file  Social History Narrative   Not on file   Social Determinants of Health   Financial Resource Strain: Not on file  Food Insecurity: Not on file  Transportation Needs: Not on file  Physical Activity: Not on file  Stress: Not on file  Social Connections: Not on file  Intimate Partner Violence: Not on file    History  reviewed. No pertinent family history.  Anti-infectives: Anti-infectives (From admission, onward)    Start     Dose/Rate Route Frequency Ordered Stop   11/20/22 1130  CIPROFLOXACIN HCL 500 MG PO TABS        500 mg Oral  Once 11/20/22 1127 11/20/22 1220       Current Outpatient Medications  Medication Sig Dispense Refill   aspirin EC 81 MG tablet Take 1 tablet (81 mg total) by mouth every other day. 30 tablet 11   Barberry-Oreg Grape-Goldenseal (BERBERINE COMPLEX PO) Take 1 capsule by mouth daily.     Calcium Carb-Cholecalciferol (CALCIUM + D3 PO) Take 1 tablet by mouth daily.     Camphor-Menthol-Methyl Sal (SALONPAS) 3.09-27-08 % PTCH Apply 1 patch topically daily.     Cinnamon 500 MG capsule Take 500 mg by mouth daily.     diphenhydrAMINE HCl (NERVINE PO) Take by mouth. Patient states that he takes one a day     hydrochlorothiazide (HYDRODIURIL) 25 MG tablet Take 25 mg by mouth daily.     insulin degludec (TRESIBA FLEXTOUCH) 100 UNIT/ML FlexTouch Pen Inject 12 Units into the skin at bedtime. 9 mL 3   Multiple Vitamins-Minerals (CENTRUM) tablet Take 1 tablet by mouth daily. 50 +     OVER THE COUNTER MEDICATION Take 1 tablet by mouth daily. Super Beta prostate formula     OVER THE COUNTER MEDICATION Take 1 capsule by mouth daily. vision essentials     OVER THE COUNTER MEDICATION      saw palmetto 500 MG capsule Take 5,300 mg by mouth daily.     tadalafil (CIALIS) 5 MG tablet Take 1 tablet (5 mg total) by mouth daily as needed for erectile dysfunction. 30 tablet 11   tamsulosin (FLOMAX) 0.4 MG CAPS capsule Take 0.4 mg by mouth daily.     vitamin E 180 MG (400 UNITS) capsule Take 400 Units by mouth daily.     Vitamin Mixture (ESTER-C PO) Take 1,000 mg by mouth daily.     No current facility-administered medications for this visit.     Objective: Vital signs in last 24 hours: BP (!) 160/78   Pulse 91   Ht 5' 11" (1.803 m)   Wt 185 lb (83.9 kg)   BMI 25.80 kg/m   Intake/Output  from previous day: No intake/output data recorded. Intake/Output this shift: @IOTHISSHIFT@   Physical Exam Vitals reviewed.  Constitutional:      Appearance: Normal appearance.  Cardiovascular:       Rate and Rhythm: Normal rate and regular rhythm.     Heart sounds: Normal heart sounds.  Pulmonary:     Effort: Pulmonary effort is normal.     Breath sounds: Normal breath sounds.  Neurological:     Mental Status: He is alert.     Lab Results:  Results for orders placed or performed in visit on 11/20/22 (from the past 24 hour(s))  Urinalysis, Routine w reflex microscopic     Status: Abnormal   Collection Time: 11/20/22 11:53 AM  Result Value Ref Range   Specific Gravity, UA 1.015 1.005 - 1.030   pH, UA 6.0 5.0 - 7.5   Color, UA Yellow Yellow   Appearance Ur Clear Clear   Leukocytes,UA Negative Negative   Protein,UA Negative Negative/Trace   Glucose, UA 3+ (A) Negative   Ketones, UA Negative Negative   RBC, UA Negative Negative   Bilirubin, UA Negative Negative   Urobilinogen, Ur 0.2 0.2 - 1.0 mg/dL   Nitrite, UA Negative Negative   Microscopic Examination Comment    Narrative   Performed at:  01 - [facility] [address], [place], [place]  [id number] Lab Director: Lorie [place], Phone:  [phone]   UA has 3+ glucose.  I have reviewed his prostate US that shows a 27ml prostate with a small middle lobe.  No lesions noted.     Procedure: Flexible cystoscopy.  He was prepped with betadine and 2% lidocaine jelly and given PO cipro.  The scope passed easily but there was a short, moderate caliber mid bulbar stricture that prevented scope passage.  I could see the external sphincter which was intact but could not get into the bladder.  The stricture didn't appear to be sufficiently narrow to reduce the flow to 6ml/sec.    Assessment/Plan: BPH with LUTS.   He has a small prostate but has a middle lobe that is likely obstructing but he also has a bulbar  stricture.   I believe he needs to go the OR for dilation of the stricture with possible Optilume and possible limited TURP or TUIP.  I have reviewed the risks in detail including the impact on ejaculation as well as bleeding, infection, injury to adjacent structures, recurrent stricture, incontinence, thrombotic events and anesthetic complications.       ED that is likely vascular with his long history of diabetes.  Will address when we get the LUTS sorted out but he has some interest in an IPP.   History of UTI.  UA is clear.   Meds ordered this encounter  Medications   ciprofloxacin (CIPRO) tablet 500 mg     Orders Placed This Encounter  Procedures   Urinalysis, Routine w reflex microscopic   PR COMPLEX UROFLOMETRY   BLADDER SCAN AMB NON-IMAGING     Return for schedule surgery, Next available.    CC: [facility]     Annikah Lovins 11/21/2022 [phone] ID: Dresean Hornaday, male   DOB: 11/12/1945, 77 y.o.   MRN: [id number]  

## 2022-11-20 NOTE — Progress Notes (Addendum)
Uroflow  Peak Flow: 71m Average Flow: 476mVoided Volume: 13555moiding Time: 31sec Flow Time: 31sec Time to Peak Flow: 5sec  PVR Volume: 8ml22m

## 2022-11-20 NOTE — Progress Notes (Signed)
Subjective: 1. BPH with urinary obstruction   2. Weak urinary stream   3. Incomplete bladder emptying   4. 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 Erectile dysfunction associated with type 2 diabetes mellitus [facility])      Consult requested by Dr. Jimmye Norman with [facility]  11/20/22: Lawrence [name] returns today in f/u for a flowrate and possible cystoscopy.  A prostate [place] prior to this visit shows a 42m prostate with a small middle lobe.  His PF is 668msec with 13514moided.  He is currently on tamulosin.  He has persistent ED.   10/30/22: Lawrence [name] seen in 3/22 for LUTS and ED and was started on daily tadalafil.  He returns now with a recent UTI a week or so ago that was treated with cefdinir and he was given tamsulosin which he started about 2 weeks ago which has helped.  His UA is clear today and a PVR is 74 ml but his IPSS is 23 with nocturia x 3.  He has a reduced stream and a sensation of incomplete emptying.   He has had no hematuria.  He is on saw palmetto. He has some interest in Urolift.  His last PSA was 0.3 in 2/22.   He is not responding to tadalafil.  HIs UA is clear today.    12/06/20: JJeneen [name] 70 male who presents with LUTS and ED.  He had the onset of symptoms about 1.5 years ago.  He is currently on Super Beta Prostate and ProstaGenix.   He reports an elevated PSA but the level was 0.3 in 2/22.  His IPSS is 22 with frequency, nocturia x 3, a reduced stream with intermittency and a sensation of incomplete emptying.  He has no hematuria or dysuria.   He has no prior GU history or family history of prostate cancer.  He has ED for the last 2+ years that has been progressive.  He can get a partial erection.  He has no penile pain or curvature.  He has not been able to reach a climax.  He has not had treatment yet.  He is a non-smoker.  He has had diabetes for 25 yrs.  He has no neuropathy.  ROS:  Review of Systems  All other systems reviewed and are  negative.   Allergies  Allergen Reactions   Penicillins Hives   Ranibizumab Rash    Past Medical History:  Diagnosis Date   Arthritis    Diabetes mellitus without complication (HCC)    GERD (gastroesophageal reflux disease)    Hypertension    Sleep apnea     Past Surgical History:  Procedure Laterality Date   BACK SURGERY     COLONOSCOPY N/A 02/21/2015   Procedure: COLONOSCOPY;  Surgeon: NajRogene HoustonD;  [facility];  Service: Endoscopy;  Laterality: N/A;  1025   COLONOSCOPY  11/13/2021   COLONOSCOPY WITH PROPOFOL N/A 11/13/2021   Procedure: COLONOSCOPY WITH PROPOFOL;  Surgeon: RehRogene HoustonD;  [facility];  Service: Endoscopy;  Laterality: N/A;  155   LEFT HEART CATH AND CORONARY ANGIOGRAPHY N/A 12/23/2020   Procedure: LEFT HEART CATH AND CORONARY ANGIOGRAPHY;  Surgeon: KelTroy SineD;  Location: [facility];  Service: Cardiovascular;  Laterality: N/A;   NECK SURGERY     POLYPECTOMY  11/13/2021   Procedure: POLYPECTOMY INTESTINAL;  Surgeon: RehRogene HoustonD;  [facility];  Service: Endoscopy;;   SHOULDER  SURGERY      Social History   Socioeconomic History   Marital status: Married    Spouse name: Not on file   Number of children: 3   Years of education: Not on file   Highest education level: Not on file  Occupational History   Occupation: retired  Tobacco Use   Smoking status: Never   Smokeless tobacco: Never  Vaping Use   Vaping Use: Never used  Substance and Sexual Activity   Alcohol use: No   Drug use: No   Sexual activity: Not on file  Other Topics Concern   Not on file  Social History Narrative   Not on file   Social Determinants of Health   Financial Resource Strain: Not on file  Food Insecurity: Not on file  Transportation Needs: Not on file  Physical Activity: Not on file  Stress: Not on file  Social Connections: Not on file  Intimate Partner Violence: Not on file    History  reviewed. No pertinent family history.  Anti-infectives: Anti-infectives (From admission, onward)    Start     Dose/Rate Route Frequency Ordered Stop   11/20/22 1130  CIPROFLOXACIN HCL 500 MG PO TABS        500 mg Oral  Once 11/20/22 1127 11/20/22 1220       Current Outpatient Medications  Medication Sig Dispense Refill   aspirin EC 81 MG tablet Take 1 tablet (81 mg total) by mouth every other day. 30 tablet 11   Barberry-Oreg Grape-Goldenseal (BERBERINE COMPLEX PO) Take 1 capsule by mouth daily.     Calcium Carb-Cholecalciferol (CALCIUM + D3 PO) Take 1 tablet by mouth daily.     Camphor-Menthol-Methyl Sal (SALONPAS) 3.09-27-08 % PTCH Apply 1 patch topically daily.     Cinnamon 500 MG capsule Take 500 mg by mouth daily.     diphenhydrAMINE HCl (NERVINE PO) Take by mouth. Patient states that he takes one a day     hydrochlorothiazide (HYDRODIURIL) 25 MG tablet Take 25 mg by mouth daily.     insulin degludec (TRESIBA FLEXTOUCH) 100 UNIT/ML FlexTouch Pen Inject 12 Units into the skin at bedtime. 9 mL 3   Multiple Vitamins-Minerals (CENTRUM) tablet Take 1 tablet by mouth daily. 50 +     OVER THE COUNTER MEDICATION Take 1 tablet by mouth daily. Super Beta prostate formula     OVER THE COUNTER MEDICATION Take 1 capsule by mouth daily. vision essentials     OVER THE COUNTER MEDICATION      saw palmetto 500 MG capsule Take 5,300 mg by mouth daily.     tadalafil (CIALIS) 5 MG tablet Take 1 tablet (5 mg total) by mouth daily as needed for erectile dysfunction. 30 tablet 11   tamsulosin (FLOMAX) 0.4 MG CAPS capsule Take 0.4 mg by mouth daily.     vitamin E 180 MG (400 UNITS) capsule Take 400 Units by mouth daily.     Vitamin Mixture (ESTER-C PO) Take 1,000 mg by mouth daily.     No current facility-administered medications for this visit.     Objective: Vital signs in last 24 hours: BP (!) 160/78   Pulse 91   Ht '5\' 11"'$  (1.803 m)   Wt 185 lb (83.9 kg)   BMI 25.80 kg/m   Intake/Output  from previous day: No intake/output data recorded. Intake/Output this shift: '@IOTHISSHIFT'$ @   Physical Exam Vitals reviewed.  Constitutional:      Appearance: Normal appearance.  Cardiovascular:  Rate and Rhythm: Normal rate and regular rhythm.     Heart sounds: Normal heart sounds.  Pulmonary:     Effort: Pulmonary effort is normal.     Breath sounds: Normal breath sounds.  Neurological:     Mental Status: He is alert.     Lab Results:  Results for orders placed or performed in visit on 11/20/22 (from the past 24 hour(s))  Urinalysis, Routine w reflex microscopic     Status: Abnormal   Collection Time: 11/20/22 11:53 AM  Result Value Ref Range   Specific Gravity, UA 1.015 1.005 - 1.030   pH, UA 6.0 5.0 - 7.5   Color, UA Yellow Yellow   Appearance Ur Clear Clear   Leukocytes,UA Negative Negative   Protein,UA Negative Negative/Trace   Glucose, UA 3+ (A) Negative   Ketones, UA Negative Negative   RBC, UA Negative Negative   Bilirubin, UA Negative Negative   Urobilinogen, Ur 0.2 0.2 - 1.0 mg/dL   Nitrite, UA Negative Negative   Microscopic Examination Comment    Narrative   Performed at:  [facility] [address] [place], [place]  [id number] Lab Director: [place], Phone:  [phone]   UA has 3+ glucose.  I have reviewed his prostate [place] that shows a 34m prostate with a small middle lobe.  No lesions noted.     Procedure: Flexible cystoscopy.  He was prepped with betadine and 2% lidocaine jelly and given PO cipro.  The scope passed easily but there was a short, moderate caliber mid bulbar stricture that prevented scope passage.  I could see the external sphincter which was intact but could not get into the bladder.  The stricture didn't appear to be sufficiently narrow to reduce the flow to 640msec.    Assessment/Plan: BPH with LUTS.   He has a small prostate but has a middle lobe that is likely obstructing but he also has a bulbar  stricture.   I believe he needs to go the OR for dilation of the stricture with possible Optilume and possible limited TURP or TUIP.  I have reviewed the risks in detail including the impact on ejaculation as well as bleeding, infection, injury to adjacent structures, recurrent stricture, incontinence, thrombotic events and anesthetic complications.       ED that is likely vascular with his long history of diabetes.  Will address when we get the LUTS sorted out but he has some interest in an IPP.   History of UTI.  UA is clear.   Meds ordered this encounter  Medications   ciprofloxacin (CIPRO) tablet 500 mg     Orders Placed This Encounter  Procedures   Urinalysis, Routine w reflex microscopic   PR COMPLEX UROFLOMETRY   BLADDER SCAN AMB NON-IMAGING     Return for schedule surgery, Next available.    CC: [place]/09/2022 [id number]: JaJosefina Domale   DOB: 10/1945/05/127568.o.   MRN: [id number]

## 2022-11-21 ENCOUNTER — Other Ambulatory Visit: Payer: Self-pay | Admitting: Urology

## 2022-11-29 NOTE — Progress Notes (Signed)
COVID Vaccine received:  '[]'$  No '[x]'$  Yes Date of any COVID positive Test in last 90 days:  none  PCP - Stana Bunting, MD at [place] Cardiologist - Shelva Majestic, MD Endocrinology- Rayetta Pigg, NP  Chest x-ray - 12-23-2020  1v  epic EKG - 12-23-2020  epic  Stress Test -  ECHO - 12-24-2020  epic Cardiac Cath - 12-23-2020  [facility] by Dr. Claiborne Billings  PCR screen: '[]'$  Ordered & Completed                      '[]'$   No Order but Needs PROFEND                      '[x]'$   N/A for this surgery  Surgery Plan:  '[]'$  Ambulatory                            '[x]'$  Outpatient in bed                            '[]'$  Admit  Anesthesia:    '[x]'$  General  '[]'$  Spinal                           '[]'$   Choice '[]'$   MAC  Bowel Prep - '[x]'$  No  '[]'$   Yes ______  Pacemaker / ICD device '[x]'$  No '[]'$  Yes        Device order form faxed '[x]'$  No    '[]'$   Yes      Faxed to:  Spinal Cord Stimulator:'[x]'$  No '[]'$  Yes      (Remind patient to bring remote DOS) Other Implants:   History of Sleep Apnea? '[]'$  No '[x]'$  Yes   CPAP used?- '[x]'$  No '[]'$  Yes    Does the patient monitor blood sugar? '[]'$  No '[x]'$  Yes  '[]'$  N/A  Patient has: '[]'$  Pre-DM   '[]'$  DM1  '[x]'$   DM2 Does patient have a [employer] or Dexacom? '[x]'$  No '[]'$  Yes   Fasting Blood Sugar Ranges- 180-120 Checks Blood Sugar __3___ times a week  Diabetic medications: Insulin deglidec Tyler Aas) 12 units at bedtime  Blood Thinner / Instructions:none Aspirin Instructions:  ASA 81 mg  ERAS Protocol Ordered: '[x]'$  No  '[]'$  Yes Patient is to be NPO after: midnight prior  Comments: Lives in [place], [place]  Activity level: Patient is able to climb a flight of stairs without difficulty; '[x]'$  No CP  '[x]'$  No SOB.  Patient can perform ADLs without assistance.   Anesthesia review: uncontrolled DM2 (on 10-07-2022 his A1c 12.2),Patient's CBG at his PST appt was 273, repeat A1c was done today. (He states he ate a lot of pizza last night at 2130 and had no breakfast this morning) I cautioned him to watch his diet and to  call his Endocrinologist today about his medication. I told him that he might have his surgery postponed if he doesn't have better control of his DM2. Both he and his wife voiced understanding of the importance of calling his endocrinologist.   HTN, OSA - No CPAP    Patient denies shortness of breath, fever, cough and chest pain at PAT appointment.  Patient verbalized understanding and agreement to the Pre-Surgical Instructions that were given to them at this PAT appointment. Patient was also educated of the need to review these PAT instructions again prior to his  surgery.I reviewed the appropriate phone numbers to call if they have any and questions or concerns.

## 2022-11-29 NOTE — Patient Instructions (Signed)
SURGICAL WAITING ROOM VISITATION Patients having surgery or a procedure may have no more than 2 support people in the waiting area - these visitors may rotate in the visitor waiting room.   Due to an increase in RSV and influenza rates and associated hospitalizations, children ages 80 and under may not visit patients in [place]. If the patient needs to stay at the hospital during part of their recovery, the visitor guidelines for inpatient rooms apply.  PRE-OP VISITATION  Pre-op nurse will coordinate an appropriate time for 1 support person to accompany the patient in pre-op.  This support person may not rotate.  This visitor will be contacted when the time is appropriate for the visitor to come back in the pre-op area.  Please refer to the [facility] website for the visitor guidelines for Inpatients (after your surgery is over and you are in a regular room).  You are not required to quarantine at this time prior to your surgery. However, you must do this: Hand Hygiene often Do NOT share personal items Notify your provider if you are in close contact with someone who has COVID or you develop fever 100.4 or greater, new onset of sneezing, cough, sore throat, shortness of breath or body aches.  If you test positive for Covid or have been in contact with anyone that has tested positive in the last 10 days please notify you surgeon.    Your procedure is scheduled on:  Tuesday  December 09, 2022  Report to [facility] Main Entrance: [place] entrance where the [employer] is available.   Report to admitting at:  08:45   AM  +++++Call this number if you have any questions or problems the morning of surgery [phone]  DO NOT EAT OR DRINK ANYTHING AFTER MIDNIGHT THE NIGHT PRIOR TO YOUR SURGERY / PROCEDURE.   How to Manage Your Diabetes Before and After Surgery  Why is it important to control my blood sugar before and after surgery? Improving blood sugar levels before  and after surgery helps healing and can limit problems. A way of improving blood sugar control is eating a healthy diet by:  Eating less sugar and carbohydrates  Increasing activity/exercise  Talking with your doctor about reaching your blood sugar goals High blood sugars (greater than 180 mg/dL) can raise your risk of infections and slow your recovery, so you will need to focus on controlling your diabetes during the weeks before surgery. Make sure that the doctor who takes care of your diabetes knows about your planned surgery including the date and location.  How do I manage my blood sugar before surgery? Check your blood sugar at least 4 times a day, starting 2 days before surgery, to make sure that the level is not too high or low. Check your blood sugar the morning of your surgery when you wake up and every 2 hours until you get to the Short Stay unit. If your blood sugar is less than 70 mg/dL, you will need to treat for low blood sugar: Do not take insulin. Treat a low blood sugar (less than 70 mg/dL) with  cup of clear juice (cranberry or apple), 4 glucose tablets, OR glucose gel. Recheck blood sugar in 15 minutes after treatment (to make sure it is greater than 70 mg/dL). If your blood sugar is not greater than 70 mg/dL on recheck, call [phone] for further instructions. Report your blood sugar to the short stay nurse when you get to Short Stay.  If you are admitted to the hospital after surgery: Your blood sugar will be checked by the staff and you will probably be given insulin after surgery (instead of oral diabetes medicines) to make sure you have good blood sugar levels. The goal for blood sugar control after surgery is 80-180 mg/dL.   WHAT DO I DO ABOUT MY DIABETES MEDICATION?  THE DAY/ NIGHT BEFORE SURGERY, take 6 units of Tresiba insulin at bedtime.  THE MORNING OF SURGERY If your CBG is greater than 220 mg/dL, you may take  of your sliding scale  (correction) dose  of insulin.    IF you have any questions, call the nurse at [place]!!!   Oral Hygiene is also important to reduce your risk of infection.        Remember - BRUSH YOUR TEETH THE MORNING OF SURGERY WITH YOUR REGULAR TOOTHPASTE  Do NOT smoke after Midnight the night before surgery.  Take ONLY these medicines the morning of surgery with A SIP OF WATER: Tamsulosin (Flomax)   If You have been diagnosed with Sleep Apnea - Bring CPAP mask and tubing day of surgery. We will provide you with a CPAP machine on the day of your surgery.                   You may not have any metal on your body including  jewelry, and body piercing  Do not wear lotions, powders, cologne, or deodorant  Men may shave face and neck.  Contacts, Hearing Aids, dentures or bridgework may not be worn into surgery. DENTURES WILL BE REMOVED PRIOR TO SURGERY PLEASE DO NOT APPLY "Poly grip" OR ADHESIVES!!!  You may bring a small overnight bag with you on the day of surgery, only pack items that are not valuable. [facility] IS NOT RESPONSIBLE   FOR VALUABLES THAT ARE LOST OR STOLEN.   Do not bring your home medications to the hospital. The Pharmacy will dispense medications listed on your medication list to you during your admission in the Hospital.  Special Instructions: Bring a copy of your healthcare power of attorney and living will documents the day of surgery, if you wish to have them scanned into your [facility] Records- EPIC  Please read over the following fact sheets you were given: IF YOU HAVE QUESTIONS ABOUT YOUR [place], [place] [phone].   [facility] - Preparing for Surgery Before surgery, you can play an important role.  Because skin is not sterile, your skin needs to be as free of germs as possible.  You can reduce the number of germs on your skin by washing with CHG  (chlorahexidine gluconate) soap before surgery.  CHG is an antiseptic cleaner which kills germs and bonds with the skin to continue killing germs even after washing. Please DO NOT use if you have an allergy to CHG or antibacterial soaps.  If your skin becomes reddened/irritated stop using the CHG and inform your nurse when you arrive at Short Stay. Do not shave (including legs and underarms) for at least 48 hours prior to the first CHG shower.  You may shave your face/neck.  Please follow these instructions carefully:  1.  Shower with CHG Soap the night before surgery and the  morning of surgery.  2.  If you choose to wash your hair, wash your hair first as usual with your normal  shampoo.  3.  After you shampoo, rinse your hair and body thoroughly to remove the shampoo.                             4.  Use CHG as you would any other liquid soap.  You can apply chg directly to the skin and wash.  Gently with a scrungie or clean washcloth.  5.  Apply the CHG Soap to your body ONLY FROM THE NECK DOWN.   Do not use on face/ open                           Wound or open sores. Avoid contact with eyes, ears mouth and genitals (private parts).                       Wash face,  Genitals (private parts) with your normal soap.             6.  Wash thoroughly, paying special attention to the area where your  surgery  will be performed.  7.  Thoroughly rinse your body with warm water from the neck down.  8.  DO NOT shower/wash with your normal soap after using and rinsing off the CHG Soap.            9.  Pat yourself dry with a clean towel.            10.  Wear clean pajamas.            11.  Place clean sheets on your bed the night of your first shower and do not  sleep with pets.  ON THE DAY OF SURGERY : Do not apply any lotions/deodorants the morning of surgery.  Please wear clean clothes to the hospital/surgery center.    FAILURE TO FOLLOW THESE INSTRUCTIONS MAY RESULT IN THE CANCELLATION OF YOUR  SURGERY  PATIENT SIGNATURE_________________________________  NURSE SIGNATURE__________________________________  ________________________________________________________________________

## 2022-12-02 ENCOUNTER — Encounter (HOSPITAL_COMMUNITY): Payer: Self-pay

## 2022-12-02 ENCOUNTER — Encounter (HOSPITAL_COMMUNITY)
Admission: RE | Admit: 2022-12-02 | Discharge: 2022-12-02 | Disposition: A | Discharge status: Home / Self Care | Payer: Medicare HMO | Source: Ambulatory Visit | Attending: Urology | Admitting: Urology

## 2022-12-02 ENCOUNTER — Other Ambulatory Visit: Payer: Self-pay

## 2022-12-02 VITALS — BP 134/66 | HR 70 | Temp 98.7°F | Resp 16 | Ht 72.0 in | Wt 185.0 lb

## 2022-12-02 DIAGNOSIS — E119 Type 2 diabetes mellitus without complications: Secondary | ICD-10-CM

## 2022-12-02 DIAGNOSIS — G4730 Sleep apnea, unspecified: Secondary | ICD-10-CM | POA: Insufficient documentation

## 2022-12-02 DIAGNOSIS — Z01818 Encounter for other preprocedural examination: Secondary | ICD-10-CM | POA: Diagnosis present

## 2022-12-02 DIAGNOSIS — I3139 Other pericardial effusion (noninflammatory): Secondary | ICD-10-CM | POA: Diagnosis not present

## 2022-12-02 DIAGNOSIS — N400 Enlarged prostate without lower urinary tract symptoms: Secondary | ICD-10-CM | POA: Diagnosis not present

## 2022-12-02 DIAGNOSIS — I351 Nonrheumatic aortic (valve) insufficiency: Secondary | ICD-10-CM | POA: Insufficient documentation

## 2022-12-02 DIAGNOSIS — I10 Essential (primary) hypertension: Secondary | ICD-10-CM

## 2022-12-02 DIAGNOSIS — N320 Bladder-neck obstruction: Secondary | ICD-10-CM | POA: Diagnosis not present

## 2022-12-02 DIAGNOSIS — K219 Gastro-esophageal reflux disease without esophagitis: Secondary | ICD-10-CM | POA: Diagnosis not present

## 2022-12-02 LAB — GLUCOSE, CAPILLARY: Glucose-Capillary: 273 mg/dL — ABNORMAL HIGH (ref 70–99)

## 2022-12-02 LAB — BASIC METABOLIC PANEL
Anion gap: 8 (ref 5–15)
BUN: 22 mg/dL (ref 8–23)
CO2: 27 mmol/L (ref 22–32)
Calcium: 8.9 mg/dL (ref 8.9–10.3)
Chloride: 99 mmol/L (ref 98–111)
Creatinine, Ser: 1.02 mg/dL (ref 0.61–1.24)
GFR, Estimated: >60 mL/min (ref >60)
Glucose, Bld: 275 mg/dL — ABNORMAL HIGH (ref 70–99)
Potassium: 3.8 mmol/L (ref 3.5–5.1)
Sodium: 134 mmol/L — ABNORMAL LOW (ref 135–145)

## 2022-12-02 LAB — CBC
HCT: 39.1 % (ref 39.0–52.0)
Hemoglobin: 13.2 g/dL (ref 13.0–17.0)
MCH: 31.9 pg (ref 26.0–34.0)
MCHC: 33.8 g/dL (ref 30.0–36.0)
MCV: 94.4 fL (ref 80.0–100.0)
Platelets: 242 10*3/uL (ref 150–400)
RBC: 4.14 MIL/uL — ABNORMAL LOW (ref 4.22–5.81)
RDW: 10.8 % — ABNORMAL LOW (ref 11.5–15.5)
WBC: 5 10*3/uL (ref 4.0–10.5)
nRBC: 0 % (ref 0.0–0.2)

## 2022-12-03 LAB — HEMOGLOBIN A1C
Hgb A1c MFr Bld: 11.9 % — ABNORMAL HIGH (ref 4.8–5.6)
Mean Plasma Glucose: 295 mg/dL

## 2022-12-03 LAB — URINE CULTURE: Culture: NO GROWTH

## 2022-12-03 NOTE — Progress Notes (Signed)
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 never smoker with h/o HTN, sleep apnea, poorly controlled DM II, bulbar stricture, BPH with bladder outlet obstruction scheduled for above procedure 12/09/2022 with Dr. Irine Seal.    Poorly controlled diabetes.  Pt followed by endocrinology.  Last seen 10/07/2022. Per OV note A1C 12.2% up from 9.8%.  A1C 11.9 at PAT visit.  Glucose 275.  Forwarded to Dr. Jeffie Pollock.   Cardiac cath with normal coronary arteries 12/2020.  VS: BP 134/66 Comment: right arm sitting  Pulse 70   Temp 37.1 C   Resp 16   Ht 6' (1.829 m)   Wt 83.9 kg   SpO2 100%   BMI 25.09 kg/m   PROVIDERS: Bonfield Nation, MD is PCP   Cardiologist - Shelva Majestic, MD  LABS:  forwarded to Dr. Jeffie Pollock. (all labs ordered are listed, but only abnormal results are displayed)  Labs Reviewed  BASIC METABOLIC PANEL - Abnormal; Notable for the following components:      Result Value   Sodium 134 (*)    Glucose, Bld 275 (*)    All other components within normal limits  CBC - Abnormal; Notable for the following components:   RBC 4.14 (*)    RDW 10.8 (*)    All other components within normal limits  GLUCOSE, CAPILLARY - Abnormal; Notable for the following components:   Glucose-Capillary 273 (*)    All other components within normal limits  HEMOGLOBIN A1C - Abnormal; Notable for the following components:   Hgb A1c MFr Bld 11.9 (*)    All other components within normal limits  URINE CULTURE     IMAGES:   EKG:   CV: Echo 12/24/2020 1. Left ventricular ejection  fraction, by estimation, is 60 to 65%. The  left ventricle has normal function. The left ventricle has no regional  wall motion abnormalities. There is moderate asymmetric left ventricular  hypertrophy of the basal and septal  segments. Left ventricular diastolic parameters were normal.   2. Right ventricular systolic function is normal. The right ventricular  size is normal. There is normal pulmonary artery systolic pressure.   3. The pericardial effusion is anterior to the right ventricle.   4. The mitral valve is normal in structure. No evidence of mitral valve  regurgitation. No evidence of mitral stenosis.   5. The aortic valve is tricuspid. There is mild calcification of the  aortic valve. Aortic valve regurgitation is mild. Mild aortic valve  sclerosis is present, with no evidence of aortic valve stenosis.   6. The inferior vena cava is normal in size with greater than 50%  respiratory variability, suggesting right atrial pressure of 3 mmHg.  Past Medical History:  Diagnosis Date   Arthritis    Diabetes mellitus without complication (HCC)    GERD (gastroesophageal reflux disease)    Hypertension    Sleep apnea     Past Surgical History:  Procedure Laterality Date   APPENDECTOMY     77 yo   [place]   lumbar  diskectomy   done [facility]   COLONOSCOPY N/A 02/21/2015   Procedure: COLONOSCOPY;  Surgeon: Rogene Houston, MD;  Location: [facility] SUITE;  Service: Endoscopy;  Laterality: N/A;  1025   COLONOSCOPY  11/13/2021   COLONOSCOPY WITH PROPOFOL N/A 11/13/2021   Procedure: COLONOSCOPY WITH PROPOFOL;  Surgeon: Rogene Houston, MD;  Location: [facility] SU[facility];  Service: Endoscopy;  Laterality: N/A;  155   EYE SURGERY Bilateral    Lasik surgery   LEFT HEART CATH AND CORONARY ANGIOGRAPHY N/A 12/23/2020   Procedure: LEFT HEART CATH AND CORONARY ANGIOGRAPHY;  Surgeon: Troy Sine, MD;  Location: [facility] CV LAB;  Service: Cardiovascular;  Laterality: N/A;    NECK SURGERY  1999   Work injury, posterior fusion   POLYPECTOMY  11/13/2021   Procedure: POLYPECTOMY INTESTINAL;  Surgeon: Rogene Houston, MD;  Location: [facility];  Service: Endoscopy;;   SHOULDER SURGERY Bilateral 2000   rotoator cuff repair   done at [place]:  Apple Cider Vinegar 600 MG CAPS   aspirin EC 81 MG tablet   Barberry-Oreg Grape-Goldenseal (BERBERINE COMPLEX PO)   cholecalciferol (VITAMIN D3) 25 MCG (1000 UNIT) tablet   Cinnamon 500 MG capsule   diphenhydrAMINE HCl (NERVINE PO)   ferrous sulfate 325 (65 FE) MG tablet   hydrochlorothiazide (HYDRODIURIL) 25 MG tablet   insulin degludec (TRESIBA FLEXTOUCH) 100 UNIT/ML FlexTouch Pen   Multiple Vitamins-Minerals (OCUVITE ADULT 50+ PO)   OVER THE COUNTER MEDICATION   OVER THE COUNTER MEDICATION   OVER THE COUNTER MEDICATION   SAW PALMETTO, SERENOA REPENS, PO   tadalafil (CIALIS) 5 MG tablet   tamsulosin (FLOMAX) 0.4 MG CAPS capsule   vitamin E 180 MG (400 UNITS) capsule   Vitamin Mixture (ESTER-C PO)   No current facility-administered medications for this encounter.     Konrad Felix Ward, PA-C [facility]-Surgical Testing [phone]

## 2022-12-09 ENCOUNTER — Encounter (HOSPITAL_COMMUNITY): Payer: Self-pay | Admitting: Urology

## 2022-12-09 ENCOUNTER — Encounter (HOSPITAL_COMMUNITY)
Admission: RE | Disposition: A | Discharge status: Home / Self Care | Payer: Self-pay | Source: Home / Self Care | Attending: Urology

## 2022-12-09 ENCOUNTER — Ambulatory Visit: Payer: Medicare HMO | Admitting: Nurse Practitioner

## 2022-12-09 ENCOUNTER — Ambulatory Visit (HOSPITAL_COMMUNITY): Payer: Medicare HMO | Admitting: Physician Assistant

## 2022-12-09 ENCOUNTER — Ambulatory Visit (HOSPITAL_BASED_OUTPATIENT_CLINIC_OR_DEPARTMENT_OTHER): Payer: Medicare HMO | Admitting: Anesthesiology

## 2022-12-09 ENCOUNTER — Observation Stay (HOSPITAL_COMMUNITY)
Admission: RE | Admit: 2022-12-09 | Discharge: 2022-12-10 | Disposition: A | Discharge status: Home / Self Care | Payer: Medicare HMO | Attending: Urology | Admitting: Urology

## 2022-12-09 ENCOUNTER — Observation Stay (HOSPITAL_COMMUNITY): Payer: Medicare HMO

## 2022-12-09 ENCOUNTER — Other Ambulatory Visit: Payer: Self-pay

## 2022-12-09 DIAGNOSIS — I10 Essential (primary) hypertension: Secondary | ICD-10-CM | POA: Diagnosis not present

## 2022-12-09 DIAGNOSIS — Z79899 Other long term (current) drug therapy: Secondary | ICD-10-CM | POA: Insufficient documentation

## 2022-12-09 DIAGNOSIS — E119 Type 2 diabetes mellitus without complications: Secondary | ICD-10-CM | POA: Insufficient documentation

## 2022-12-09 DIAGNOSIS — N35912 Unspecified bulbous urethral stricture, male: Principal | ICD-10-CM | POA: Insufficient documentation

## 2022-12-09 DIAGNOSIS — N138 Other obstructive and reflux uropathy: Secondary | ICD-10-CM | POA: Diagnosis not present

## 2022-12-09 DIAGNOSIS — N320 Bladder-neck obstruction: Secondary | ICD-10-CM | POA: Diagnosis not present

## 2022-12-09 DIAGNOSIS — N401 Enlarged prostate with lower urinary tract symptoms: Secondary | ICD-10-CM | POA: Diagnosis not present

## 2022-12-09 DIAGNOSIS — N400 Enlarged prostate without lower urinary tract symptoms: Secondary | ICD-10-CM

## 2022-12-09 DIAGNOSIS — Z7982 Long term (current) use of aspirin: Secondary | ICD-10-CM | POA: Diagnosis not present

## 2022-12-09 DIAGNOSIS — Z794 Long term (current) use of insulin: Secondary | ICD-10-CM

## 2022-12-09 HISTORY — PX: TRANSURETHRAL INCISION OF PROSTATE: SHX2573

## 2022-12-09 HISTORY — PX: CYSTOSCOPY WITH URETHRAL DILATATION: SHX5125

## 2022-12-09 LAB — GLUCOSE, CAPILLARY
Glucose-Capillary: 173 mg/dL — ABNORMAL HIGH (ref 70–99)
Glucose-Capillary: 142 mg/dL — ABNORMAL HIGH (ref 70–99)
Glucose-Capillary: 187 mg/dL — ABNORMAL HIGH (ref 70–99)
Glucose-Capillary: 229 mg/dL — ABNORMAL HIGH (ref 70–99)

## 2022-12-09 SURGERY — CYSTOSCOPY, WITH URETHRAL DILATION
Anesthesia: General

## 2022-12-09 MED ORDER — CIPROFLOXACIN HCL 500 MG PO TABS
500.0000 mg | ORAL_TABLET | Freq: Two times a day (BID) | ORAL | Status: AC
  Administered 2022-12-09 – 2022-12-10 (×2): 500 mg via ORAL
  Filled 2022-12-09 (×2): qty 1

## 2022-12-09 MED ORDER — PROPOFOL 10 MG/ML IV BOLUS
INTRAVENOUS | Status: DC | PRN
  Administered 2022-12-09: 160 mg via INTRAVENOUS

## 2022-12-09 MED ORDER — DEXAMETHASONE SODIUM PHOSPHATE 10 MG/ML IJ SOLN
INTRAMUSCULAR | Status: DC | PRN
  Administered 2022-12-09: 4 mg via INTRAVENOUS

## 2022-12-09 MED ORDER — LIDOCAINE HCL (PF) 2 % IJ SOLN
INTRAMUSCULAR | Status: AC
  Filled 2022-12-09: qty 5

## 2022-12-09 MED ORDER — ONDANSETRON HCL 4 MG/2ML IJ SOLN
4.0000 mg | INTRAMUSCULAR | Status: DC | PRN
  Administered 2022-12-09: 4 mg via INTRAVENOUS
  Filled 2022-12-09: qty 2

## 2022-12-09 MED ORDER — HYDROMORPHONE HCL 1 MG/ML IJ SOLN
0.5000 mg | INTRAMUSCULAR | Status: DC | PRN
  Administered 2022-12-09 (×2): 1 mg via INTRAVENOUS
  Filled 2022-12-09 (×2): qty 1

## 2022-12-09 MED ORDER — OXYCODONE HCL 5 MG PO TABS: 5.0000 mg | ORAL_TABLET | Freq: Once | ORAL | Status: DC | PRN

## 2022-12-09 MED ORDER — ORAL CARE MOUTH RINSE: 15.0000 mL | Freq: Once | OROMUCOSAL | Status: AC

## 2022-12-09 MED ORDER — INSULIN ASPART 100 UNIT/ML IJ SOLN
0.0000 [IU] | Freq: Three times a day (TID) | INTRAMUSCULAR | Status: DC
  Administered 2022-12-09 – 2022-12-10 (×2): 3 [IU] via SUBCUTANEOUS

## 2022-12-09 MED ORDER — SODIUM CHLORIDE 0.9 % IR SOLN
Status: DC | PRN
  Administered 2022-12-09: 6000 mL via INTRAVESICAL

## 2022-12-09 MED ORDER — OXYCODONE HCL 5 MG PO TABS: 5.0000 mg | ORAL_TABLET | ORAL | Status: DC | PRN

## 2022-12-09 MED ORDER — ONDANSETRON HCL 4 MG/2ML IJ SOLN
INTRAMUSCULAR | Status: AC
  Filled 2022-12-09: qty 2

## 2022-12-09 MED ORDER — ACETAMINOPHEN 325 MG PO TABS
650.0000 mg | ORAL_TABLET | ORAL | Status: DC | PRN
  Administered 2022-12-10: 650 mg via ORAL
  Filled 2022-12-09: qty 2

## 2022-12-09 MED ORDER — LACTATED RINGERS IV SOLN: INTRAVENOUS | Status: DC

## 2022-12-09 MED ORDER — ONDANSETRON HCL 4 MG/2ML IJ SOLN
INTRAMUSCULAR | Status: DC | PRN
  Administered 2022-12-09: 4 mg via INTRAVENOUS

## 2022-12-09 MED ORDER — CEFAZOLIN SODIUM-DEXTROSE 2-4 GM/100ML-% IV SOLN
2.0000 g | INTRAVENOUS | Status: AC
  Administered 2022-12-09: 2 g via INTRAVENOUS
  Filled 2022-12-09: qty 100

## 2022-12-09 MED ORDER — HYDROCHLOROTHIAZIDE 25 MG PO TABS
25.0000 mg | ORAL_TABLET | Freq: Every day | ORAL | Status: DC
  Administered 2022-12-09 – 2022-12-10 (×2): 25 mg via ORAL
  Filled 2022-12-09 (×2): qty 1

## 2022-12-09 MED ORDER — FENTANYL CITRATE (PF) 250 MCG/5ML IJ SOLN
INTRAMUSCULAR | Status: DC | PRN
  Administered 2022-12-09 (×2): 50 ug via INTRAVENOUS

## 2022-12-09 MED ORDER — OXYCODONE HCL 5 MG/5ML PO SOLN: 5.0000 mg | Freq: Once | ORAL | Status: DC | PRN

## 2022-12-09 MED ORDER — ZOLPIDEM TARTRATE 5 MG PO TABS: 5.0000 mg | ORAL_TABLET | Freq: Every evening | ORAL | Status: DC | PRN

## 2022-12-09 MED ORDER — LIDOCAINE 2% (20 MG/ML) 5 ML SYRINGE
INTRAMUSCULAR | Status: DC | PRN
  Administered 2022-12-09: 60 mg via INTRAVENOUS

## 2022-12-09 MED ORDER — FENTANYL CITRATE (PF) 100 MCG/2ML IJ SOLN
INTRAMUSCULAR | Status: AC
  Filled 2022-12-09: qty 2

## 2022-12-09 MED ORDER — FENTANYL CITRATE PF 50 MCG/ML IJ SOSY: 25.0000 ug | PREFILLED_SYRINGE | INTRAMUSCULAR | Status: DC | PRN

## 2022-12-09 MED ORDER — POTASSIUM CHLORIDE IN NACL 20-0.45 MEQ/L-% IV SOLN
INTRAVENOUS | Status: DC
  Filled 2022-12-09 (×2): qty 1000

## 2022-12-09 MED ORDER — HYOSCYAMINE SULFATE 0.125 MG SL SUBL: 0.1250 mg | SUBLINGUAL_TABLET | SUBLINGUAL | Status: DC | PRN

## 2022-12-09 MED ORDER — SODIUM CHLORIDE 0.9 % IR SOLN: 3000.0000 mL | Status: DC

## 2022-12-09 MED ORDER — DEXAMETHASONE SODIUM PHOSPHATE 10 MG/ML IJ SOLN
INTRAMUSCULAR | Status: AC
  Filled 2022-12-09: qty 1

## 2022-12-09 MED ORDER — CHLORHEXIDINE GLUCONATE 0.12 % MT SOLN
15.0000 mL | Freq: Once | OROMUCOSAL | Status: AC
  Administered 2022-12-09: 15 mL via OROMUCOSAL

## 2022-12-09 MED ORDER — ONDANSETRON HCL 4 MG/2ML IJ SOLN: 4.0000 mg | Freq: Once | INTRAMUSCULAR | Status: DC | PRN

## 2022-12-09 MED ORDER — FLEET ENEMA 7-19 GM/118ML RE ENEM: 1.0000 | ENEMA | Freq: Once | RECTAL | Status: DC | PRN

## 2022-12-09 MED ORDER — BISACODYL 10 MG RE SUPP: 10.0000 mg | Freq: Every day | RECTAL | Status: DC | PRN

## 2022-12-09 MED ORDER — SENNOSIDES-DOCUSATE SODIUM 8.6-50 MG PO TABS: 1.0000 | ORAL_TABLET | Freq: Every evening | ORAL | Status: DC | PRN

## 2022-12-09 SURGICAL SUPPLY — 27 items
BAG DRN RND TRDRP ANRFLXCHMBR (UROLOGICAL SUPPLIES) ×1
BAG URINE DRAIN 2000ML AR STRL (UROLOGICAL SUPPLIES) IMPLANT
BAG URO CATCHER STRL LF (MISCELLANEOUS) ×1 IMPLANT
BALLN NEPHROSTOMY (BALLOONS)
BALLOON NEPHROSTOMY (BALLOONS) IMPLANT
CATH FOL 3WAY LX 20X30 (CATHETERS) IMPLANT
CATH FOLEY 3WAY 30CC 22FR (CATHETERS) IMPLANT
CATH ROBINSON RED A/P 16FR (CATHETERS) IMPLANT
CATH URETL OPEN 5X70 (CATHETERS) IMPLANT
CLOTH BEACON ORANGE TIMEOUT ST (SAFETY) ×1 IMPLANT
DRAPE FOOT SWITCH (DRAPES) ×1 IMPLANT
ELECT REM PT RETURN 15FT ADLT (MISCELLANEOUS) ×1 IMPLANT
GLOVE SURG SS PI 8.0 STRL IVOR (GLOVE) IMPLANT
GOWN STRL REUS W/ TWL XL LVL3 (GOWN DISPOSABLE) ×1 IMPLANT
GOWN STRL REUS W/TWL XL LVL3 (GOWN DISPOSABLE) ×1
GUIDEWIRE STR DUAL SENSOR (WIRE) ×1 IMPLANT
HOLDER FOLEY CATH W/STRAP (MISCELLANEOUS) IMPLANT
KIT TURNOVER KIT A (KITS) IMPLANT
LOOP CUT BIPOLAR 24F LRG (ELECTROSURGICAL) IMPLANT
MANIFOLD NEPTUNE II (INSTRUMENTS) ×1 IMPLANT
PACK CYSTO (CUSTOM PROCEDURE TRAY) ×1 IMPLANT
PENCIL SMOKE EVACUATOR (MISCELLANEOUS) IMPLANT
SYR 30ML LL (SYRINGE) IMPLANT
SYR TOOMEY IRRIG 70ML (MISCELLANEOUS)
SYRINGE TOOMEY IRRIG 70ML (MISCELLANEOUS) IMPLANT
TUBING CONNECTING 10 (TUBING) ×1 IMPLANT
TUBING UROLOGY SET (TUBING) ×1 IMPLANT

## 2022-12-09 NOTE — Transfer of Care (Signed)
Immediate Anesthesia Transfer of Care Note  Patient: Lawrence [name]  Procedure(s) Performed: CYSTOSCOPY WITH URETHRAL DILATATION TRANSURETHRAL RESECTION OF PROSTATE  Patient Location: PACU  Anesthesia Type:General  Level of Consciousness: oriented, sedated, and patient cooperative  Airway & Oxygen Therapy: Patient Spontanous Breathing and Patient connected to face mask oxygen  Post-op Assessment: Report given to RN and Post -op Vital signs reviewed and stable  Post vital signs: Reviewed  Last Vitals:  Vitals Value Taken Time  BP 153/76 12/09/22 1250  Temp    Pulse 80 12/09/22 1253  Resp    SpO2 100 % 12/09/22 1253  Vitals shown include unvalidated device data.  Last Pain:  Vitals:   12/09/22 1004  TempSrc:   PainSc: 0-No pain         Complications: No notable events documented.

## 2022-12-09 NOTE — Interval H&P Note (Signed)
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  The various methods of treatment have been discussed with the patient and family. After consideration of risks, benefits and other options for treatment, the patient has consented to  Procedure(s) with comments: CYSTOSCOPY WITH URETHRAL DILATATION POSSIBLE OPTILUM (N/A) TRANSURETHRAL INCISION OF THE PROSTATE (TUIP) TRANSURETHRAL RESECTION OF PROSTATE (N/A) - 1 HR FOR CASE as a surgical intervention.  The patient's history has been reviewed, patient examined, no change in status, stable for surgery.  I have reviewed the patient's chart and labs.  Questions were answered to the patient's satisfaction.     Irine Seal

## 2022-12-09 NOTE — Anesthesia Procedure Notes (Signed)
Procedure Name: LMA Insertion Date/Time: 12/09/2022 12:08 PM  Performed by: Jenne Campus, CRNAPre-anesthesia Checklist: Patient identified, Emergency Drugs available, Suction available and Patient being monitored Patient Re-evaluated:Patient Re-evaluated prior to induction Oxygen Delivery Method: Circle System Utilized Preoxygenation: Pre-oxygenation with 100% oxygen Induction Type: IV induction Ventilation: Mask ventilation without difficulty LMA: LMA inserted LMA Size: 4.0 Number of attempts: 1 Placement Confirmation: positive ETCO2 and breath sounds checked- equal and bilateral Tube secured with: Tape Dental Injury: Teeth and Oropharynx as per pre-operative assessment

## 2022-12-09 NOTE — Anesthesia Preprocedure Evaluation (Addendum)
Anesthesia Evaluation  Patient identified by MRN, date of birth, ID band Patient awake    Reviewed: Allergy & Precautions, NPO status , Patient's Chart, lab work & pertinent test results  Airway Mallampati: II  TM Distance: >3 FB Neck ROM: Limited    Dental no notable dental hx. (+) Dental Advisory Given, Teeth Intact, Caps   Pulmonary sleep apnea    Pulmonary exam normal breath sounds clear to auscultation       Cardiovascular hypertension, Pt. on medications Normal cardiovascular exam Rhythm:Regular Rate:Normal     Neuro/Psych negative neurological ROS  negative psych ROS   GI/Hepatic Neg liver ROS,GERD  Medicated,,  Endo/Other  diabetes, Well Controlled, Type 2, Insulin Dependent  Hyperlipidemia  Renal/GU negative Renal ROS Bladder dysfunction  BPH with bladder outlet obstruction Bulbar stricture    Musculoskeletal  (+) Arthritis , Osteoarthritis,  Hx/o cervical and lumbar surgery   Abdominal   Peds  Hematology   Anesthesia Other Findings   Reproductive/Obstetrics                              Anesthesia Physical Anesthesia Plan  ASA: 2  Anesthesia Plan: General   Post-op Pain Management: Minimal or no pain anticipated   Induction: Intravenous  PONV Risk Score and Plan: 3 and Treatment may vary due to age or medical condition  Airway Management Planned: LMA  Additional Equipment: None  Intra-op Plan:   Post-operative Plan: Extubation in OR  Informed Consent: I have reviewed the patients History and Physical, chart, labs and discussed the procedure including the risks, benefits and alternatives for the proposed anesthesia with the patient or authorized representative who has indicated his/her understanding and acceptance.     Dental advisory given  Plan Discussed with: CRNA and Anesthesiologist  Anesthesia Plan Comments:          Anesthesia Quick  Evaluation

## 2022-12-09 NOTE — Anesthesia Postprocedure Evaluation (Signed)
Anesthesia Post Note  Patient: Lawrence [name]  Procedure(s) Performed: CYSTOSCOPY WITH URETHRAL DILATATION TRANSURETHRAL RESECTION OF PROSTATE     Patient location during evaluation: PACU Anesthesia Type: General Level of consciousness: awake and alert and oriented Pain management: pain level controlled Vital Signs Assessment: post-procedure vital signs reviewed and stable Respiratory status: spontaneous breathing, nonlabored ventilation and respiratory function stable Cardiovascular status: blood pressure returned to baseline and stable Postop Assessment: no apparent nausea or vomiting Anesthetic complications: no   No notable events documented.  Last Vitals:  Vitals:   12/09/22 1315 12/09/22 1330  BP: (!) 152/79 (!) 159/71  Pulse: 86 74  Resp: 11 11  Temp:  (!) 36.4 C  SpO2: 100% 100%    Last Pain:  Vitals:   12/09/22 1330  TempSrc:   PainSc: 0-No pain                 Ha Shannahan A.

## 2022-12-09 NOTE — Op Note (Signed)
Procedure: 1.  Cystoscopy with urethral dilation. 2.  Transurethral resection of the prostate.  Preop diagnosis: 1.  Bulbar urethral stricture. 2.  BPH with bladder outlet obstruction.  Postop diagnosis: Same.  Surgeon: Dr. Irine Seal.  Anesthesia: General.  Specimen: Prostate chips.  Drains: 22 French three-way Foley catheter.  EBL: None.  Complications: None.  Indications: The patient is a 77 year old male with progressive voiding symptoms and reduced stream.  He was was found to have a moderate bulbar urethral stricture on office cystoscopy it would not allow passage of the scope.  It was not felt to be narrow enough to impact flow so it was felt that he needed to undergo cystoscopy with dilation of the stricture and assessment of the bladder outlet for transurethral incision versus transurethral resection of the prostate.  Procedure: He was taken the operating room where general anesthetic was induced.  He was given antibiotics.  He was placed in lithotomy position and fitted with PAS hose.  His perineum and genitalia were prepped Betadine solution was draped in usual sterile fashion.  Cystoscopy was performed using the 21 [place] scope and 30 degree lens.  Examination revealed a normal anterior urethra.  There was a moderate stricture in the bulb that would not allow passage of the scope.  A sensor wire was then advanced through the scope into the bladder and the cystoscope was removed.  Hayman dilators were then passed over the wire from 71 [place] to 70 French with mild resistance.  The cystoscope was then reinserted alongside the wire and the stricture was well disrupted but they did not appear to be significant mural fibrosis.  The external sphincter was intact.  The prostatic urethra was short with minimal lateral lobe hyperplasia but a high stiff bladder neck and a small middle lobe.  Examination of bladder revealed mild trabeculation without tumors, stones or inflammation.  The  cystoscope was removed and was replaced with the 26 French continuous-flow resectoscope with the aid of visual obturator.  It was a bit difficult getting over the bladder neck.  Once the scope was in position the visual obturator was exchanged for an [place] handle with a bipolar loop and a 30 degree lens.  Saline was used as the irrigant.  The bladder neck prostatic tissue was then resected from 5-7 o'clock down to the capsular fibers and then the floor of the prostate was resected out to the verumontanum.  There is no need to resect the lateral lobes.  The bladder neck was still little high after the initial resection so I took the bipolar Collins knife and made an incision on the left through the bladder neck to further widen the outlet.  At this point the loop was used to achieve hemostasis.  The bladder was evacuated free of chips.  Final inspection demonstrated intact ureteral orifices, and a widely patent bladder neck and prostatic urethra.  The external sphincter was intact.  The resectoscope was removed and pressure on the bladder produced an excellent flow.  A 22 French three-way Foley catheter was then placed without difficulty and the balloon was filled with 30 mL of sterile fluid.  The catheter was irrigated with clear return and the irrigation port was plugged.  The catheter was placed to straight drainage.  He was taken down for lithotomy position, his anesthetic was reversed and he was moved to recovery room in stable condition.  There were no complications.

## 2022-12-10 ENCOUNTER — Encounter (HOSPITAL_COMMUNITY): Payer: Self-pay | Admitting: Urology

## 2022-12-10 DIAGNOSIS — N35912 Unspecified bulbous urethral stricture, male: Secondary | ICD-10-CM | POA: Diagnosis not present

## 2022-12-10 LAB — SURGICAL PATHOLOGY

## 2022-12-10 LAB — GLUCOSE, CAPILLARY: Glucose-Capillary: 191 mg/dL — ABNORMAL HIGH (ref 70–99)

## 2022-12-10 NOTE — Discharge Summary (Signed)
Physician Discharge Summary  Patient ID: Lawrence [name] MRN: [id number] DOB/AGE: 75-30-1947 75 y.o.  Admit date: 12/09/2022 Discharge date: 12/10/2022  Admission Diagnoses:  BPH with obstruction/lower urinary tract symptoms  Discharge Diagnoses:  Principal Problem:   BPH with obstruction/lower urinary tract symptoms   Past Medical History:  Diagnosis Date   Arthritis    Diabetes mellitus without complication ([place])    GERD (gastroesophageal reflux disease)    Hypertension    Sleep apnea     Surgeries: Procedure(s): CYSTOSCOPY WITH URETHRAL DILATATION TRANSURETHRAL RESECTION OF PROSTATE on 12/09/2022   Consultants (if any):   Discharged Condition: Improved  Hospital Course: Donzell Hamley is an 78 y.o. male who was admitted 12/09/2022 with a diagnosis of BPH with obstruction/lower urinary tract symptoms and went to the operating room on 12/09/2022 and underwent the above named procedures.  The foley was removed on 3/20 and he voided successfully and was discharged home.   He was given perioperative antibiotics:  Anti-infectives (From admission, onward)    Start     Dose/Rate Route Frequency Ordered Stop   12/09/22 2000  ciprofloxacin (CIPRO) tablet 500 mg        500 mg Oral 2 times daily 12/09/22 1437 12/10/22 1959   12/09/22 1130  ceFAZolin (ANCEF) IVPB 2g/100 mL premix        2 g 200 mL/hr over 30 Minutes Intravenous 30 min pre-op 12/09/22 0927 12/09/22 1210     .  He was given sequential compression devices for DVT prophylaxis.  He benefited maximally from the hospital stay and there were no complications.    Recent vital signs:  Vitals:   12/10/22 0238 12/10/22 0543  BP: 121/64 (!) 122/51  Pulse: 84 87  Resp: 18 17  Temp: 97.9 F (36.6 C) 98 F (36.7 C)  SpO2: 98% 99%    Recent laboratory studies:  Lab Results  Component Value Date   HGB 13.2 12/02/2022   HGB 11.8 (L) 12/24/2020   HGB 13.7 12/23/2020   Lab Results  Component Value Date   WBC 5.0  12/02/2022   PLT 242 12/02/2022   Lab Results  Component Value Date   INR 1.0 12/23/2020   Lab Results  Component Value Date   NA 134 (L) 12/02/2022   K 3.8 12/02/2022   CL 99 12/02/2022   CO2 27 12/02/2022   BUN 22 12/02/2022   CREATININE 1.02 12/02/2022   GLUCOSE 275 (H) 12/02/2022    Discharge Medications:   Allergies as of 12/10/2022       Reactions   Penicillins Hives   Lucentis [ranibizumab] Rash        Medication List     TAKE these medications    Apple Cider Vinegar 600 MG Caps Take 600 mg by mouth daily.   aspirin EC 81 MG tablet Take 1 tablet (81 mg total) by mouth every other day.   BERBERINE COMPLEX PO Take 1 capsule by mouth daily.   cholecalciferol 25 MCG (1000 UNIT) tablet Commonly known as: VITAMIN D3 Take 1,000 Units by mouth daily.   Cinnamon 500 MG capsule Take 500 mg by mouth daily.   ESTER-C PO Take 1,000 mg by mouth daily.   ferrous sulfate 325 (65 FE) MG tablet Take 325 mg by mouth daily with breakfast.   hydrochlorothiazide 25 MG tablet Commonly known as: HYDRODIURIL Take 25 mg by mouth daily.   NERVINE PO Take 1 tablet by mouth daily. Patient states that he takes one a day  OCUVITE ADULT 50+ PO Take 1 tablet by mouth daily.   OVER THE COUNTER MEDICATION Take 1 capsule by mouth daily. Prostagenix   OVER THE COUNTER MEDICATION Take 1 capsule by mouth daily. Diabacore (Diabetic Supplement)   OVER THE COUNTER MEDICATION Take 1 tablet by mouth in the morning and at bedtime. Glucoceil   tadalafil 5 MG tablet Commonly known as: CIALIS Take 1 tablet (5 mg total) by mouth daily as needed for erectile dysfunction.   Tyler Aas FlexTouch 100 UNIT/ML FlexTouch Pen Generic drug: insulin degludec Inject 12 Units into the skin at bedtime.   vitamin E 180 MG (400 UNITS) capsule Take 400 Units by mouth daily.        Diagnostic Studies: DG C-Arm 1-60 Min-No Report  Result Date: 12/09/2022 Fluoroscopy was utilized by the  requesting physician.  No radiographic interpretation.    Disposition: Discharge disposition: 01-Home or Self Care       Discharge Instructions     Discharge patient   Complete by: As directed    Discharge disposition: 01-Home or Self Care   Discharge patient date: 12/10/2022   Discontinue IV   Complete by: As directed         Follow-up Information     Irine Seal, MD Follow up on 12/25/2022.   Specialty: Urology Why: 1:45 pm Contact information: [facility] [id number] [phone]                  Signed: Irine Seal 12/10/2022, 6:52 AM

## 2022-12-10 NOTE — Progress Notes (Signed)
AVS given to patient and explained at the bedside. Medications and follow up appointments have been explained with pt verbalizing understanding.  

## 2022-12-25 ENCOUNTER — Encounter: Payer: Medicare HMO | Admitting: Urology

## 2022-12-25 DIAGNOSIS — N138 Other obstructive and reflux uropathy: Secondary | ICD-10-CM

## 2023-02-12 ENCOUNTER — Encounter: Payer: Self-pay | Admitting: Urology

## 2023-02-12 ENCOUNTER — Ambulatory Visit (INDEPENDENT_AMBULATORY_CARE_PROVIDER_SITE_OTHER): Payer: Medicare HMO | Admitting: Urology

## 2023-02-12 VITALS — BP 128/76 | HR 102

## 2023-02-12 DIAGNOSIS — Z794 Long term (current) use of insulin: Secondary | ICD-10-CM

## 2023-02-12 DIAGNOSIS — N35912 Unspecified bulbous urethral stricture, male: Secondary | ICD-10-CM

## 2023-02-12 DIAGNOSIS — R3912 Poor urinary stream: Secondary | ICD-10-CM

## 2023-02-12 DIAGNOSIS — N138 Other obstructive and reflux uropathy: Secondary | ICD-10-CM

## 2023-02-12 DIAGNOSIS — N401 Enlarged prostate with lower urinary tract symptoms: Secondary | ICD-10-CM

## 2023-02-12 DIAGNOSIS — E1169 Type 2 diabetes mellitus with other specified complication: Secondary | ICD-10-CM

## 2023-02-12 DIAGNOSIS — N521 Erectile dysfunction due to diseases classified elsewhere: Secondary | ICD-10-CM

## 2023-02-12 DIAGNOSIS — Z87440 Personal history of urinary (tract) infections: Secondary | ICD-10-CM

## 2023-02-12 NOTE — Progress Notes (Signed)
Subjective: 1. BPH with urinary obstruction   2. Weak urinary stream   3. 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 Mayford Knife with [facility]  02/12/23: Lawrence [name] returns today in f/u from a  Cystoscopy with dilation and TURP on 12/09/22.  His path was benign.  He passed out in his bathroom 3 days post op and broke 3 right ribs.  He had resumed tamsulosin which I had stopped at discharge.  His IPSS today is down to 2 from 23.   11/20/22: Lawrence [name] returns today in f/u for a flowrate and possible cystoscopy.  A prostate [place] prior to this visit shows a 27ml prostate with a small middle lobe.  His PF is 43ml/sec with voided.  He is currently on tamulosin.  He has persistent ED.   10/30/22: Lawrence [name] was seen in 3/22 for LUTS and ED and was started on daily tadalafil.  He returns now with a recent UTI a week or so ago that was treated with cefdinir and he was given tamsulosin which he started about 2 weeks ago which has helped.  His UA is clear today and a PVR is 74 ml but his IPSS is 23 with nocturia x 3.  He has a reduced stream and a sensation of incomplete emptying.   He has had no hematuria.  He is on saw palmetto. He has some interest in Urolift.  His last PSA was 0.3 in 2/22.   He is not responding to tadalafil.  HIs UA is clear today.    3/17/22[name] a 77 yo male who presents with LUTS and ED.  He had the onset of symptoms about 1.5 years ago.  He is currently on Super Beta Prostate and ProstaGenix.   He reports an elevated PSA but the level was 0.3 in 2/22.  His IPSS is 22 with frequency, nocturia x 3, a reduced stream with intermittency and a sensation of incomplete emptying.  He has no hematuria or dysuria.   He has no prior GU history or family history of prostate cancer.  He has ED for the last 2+ years that has been progressive.  He can get a partial erection.  He has no penile  pain or curvature.  He has not been able to reach a climax.  He has not had treatment yet.  He is a non-smoker.  He has had diabetes for 25 yrs.  He has no neuropathy.  ROS:  Review of Systems  Cardiovascular:  Negative for chest pain.  All other systems reviewed and are negative.   Allergies  Allergen Reactions   Penicillins Hives   Lucentis [Ranibizumab] Rash    Past Medical History:  Diagnosis Date   Arthritis    Diabetes mellitus without complication (HCC)    GERD (gastroesophageal reflux disease)    Hypertension    Sleep apnea     Past Surgical History:  Procedure Laterality Date   APPENDECTOMY     77 yo   BACK SURGERY  1975   lumbar diskectomy   done [facility]   COLONOSCOPY N/A 02/21/2015   Procedure: COLONOSCOPY;  Surgeon: Malissa Hippo, MD;  Location: [facility];  Service: Endoscopy;  Laterality: N/A;  1025   COLONOSCOPY  11/13/2021   COLONOSCOPY WITH PROPOFOL N/A 11/13/2021   Procedure: COLONOSCOPY WITH PROPOFOL;  Surgeon: Malissa Hippo, MD;  Location:  [facility];  Service: Endoscopy;  Laterality: N/A;  155   CYSTOSCOPY WITH URETHRAL DILATATION N/A 12/09/2022   Procedure: CYSTOSCOPY WITH URETHRAL DILATATION;  Surgeon: Bjorn Pippin, MD;  Location: [facility];  Service: Urology;  Laterality: N/A;   EYE SURGERY Bilateral    Lasik surgery   LEFT HEART CATH AND CORONARY ANGIOGRAPHY N/A 12/23/2020   Procedure: LEFT HEART CATH AND CORONARY ANGIOGRAPHY;  Surgeon: Lennette Bihari, MD;  Location: [facility] INVAS[facility] CV [facility];  Service: Cardiovascular;  Laterality: N/A;   NECK SURGERY  1999   Work injury, posterior fusion   POLYPECTOMY  11/13/2021   Procedure: POLYPECTOMY INTESTINAL;  Surgeon: Malissa Hippo, MD;  Location: [facility];  Service: Endoscopy;;   SHOULDER SURGERY Bilateral 2000   rotoator cuff repair   [facility]   TONSILLECTOMY     TRANSURETHRAL INCISION OF PROSTATE N/A 12/09/2022   Procedure: TRANSURETHRAL RESECTION OF PROSTATE;  Surgeon:  Bjorn Pippin, MD;  Location: [facility];  Service: Urology;  Laterality: N/A;  1 HR FOR CASE    Social History   Socioeconomic History   Marital status: Married    Spouse name: Not on file   Number of children: 3   Years of education: Not on file   Highest education level: Not on file  Occupational History   Occupation: retired  Tobacco Use   Smoking status: Never   Smokeless tobacco: Never  Vaping Use   Vaping Use: Never used  Substance and Sexual Activity   Alcohol use: No   Drug use: No   Sexual activity: Not Currently  Other Topics Concern   Not on file  Social History Narrative   Not on file   Social Determinants of Health   Financial Resource Strain: Not on file  Food Insecurity: No Food Insecurity (12/09/2022)   Hunger Vital Sign    Worried About Running Out of Food in the Last Year: Never true    Ran Out of Food in the Last Year: Never true  Transportation Needs: No Transportation Needs (12/09/2022)   PRAPARE - Administrator, Civil Service (Medical): No    Lack of Transportation (Non-Medical): No  Physical Activity: Not on file  Stress: Not on file  Social Connections: Not on file  Intimate Partner Violence: Not At Risk (12/09/2022)   Humiliation, Afraid, Rape, and Kick questionnaire    Fear of Current or Ex-Partner: No    Emotionally Abused: No    Physically Abused: No    Sexually Abused: No    History reviewed. No pertinent family history.  Anti-infectives: Anti-infectives (From admission, onward)    None       Current Outpatient Medications  Medication Sig Dispense Refill   Apple Cider Vinegar 600 MG CAPS Take 600 mg by mouth daily.     aspirin EC 81 MG tablet Take 1 tablet (81 mg total) by mouth every other day. 30 tablet 11   Barberry-Oreg Grape-Goldenseal (BERBERINE COMPLEX PO) Take 1 capsule by mouth daily.     cholecalciferol (VITAMIN D3) 25 MCG (1000 UNIT) tablet Take 1,000 Units by mouth daily.     Cinnamon 500 MG capsule Take 500  mg by mouth daily.     diphenhydrAMINE HCl (NERVINE PO) Take 1 tablet by mouth daily. Patient states that he takes one a day     ferrous sulfate 325 (65 FE) MG tablet Take 325 mg by mouth daily with breakfast.     hydrochlorothiazide (HYDRODIURIL) 25  MG tablet Take 25 mg by mouth daily.     insulin degludec (TRESIBA FLEXTOUCH) 100 UNIT/ML FlexTouch Pen Inject 12 Units into the skin at bedtime. 9 mL 3   Multiple Vitamins-Minerals (OCUVITE ADULT 50+ PO) Take 1 tablet by mouth daily.     OVER THE COUNTER MEDICATION Take 1 capsule by mouth daily. Prostagenix     OVER THE COUNTER MEDICATION Take 1 capsule by mouth daily. Diabacore (Diabetic Supplement)     OVER THE COUNTER MEDICATION Take 1 tablet by mouth in the morning and at bedtime. Glucoceil     tadalafil (CIALIS) 5 MG tablet Take 1 tablet (5 mg total) by mouth daily as needed for erectile dysfunction. 30 tablet 11   vitamin E 180 MG (400 UNITS) capsule Take 400 Units by mouth daily.     Vitamin Mixture (ESTER-C PO) Take 1,000 mg by mouth daily.     No current facility-administered medications for this visit.     Objective: Vital signs in last 24 hours: BP 128/76   Pulse (!) 102   Intake/Output from previous day: No intake/output data recorded. Intake/Output this shift: @IOTHISSHIFT @   Physical Exam Vitals reviewed.  Constitutional:      Appearance: Normal appearance.  Neurological:     Mental Status: He is alert.     Lab Results:  Results for orders placed or performed in visit on 02/12/23 (from the past 24 hour(s))  Urinalysis, Routine w reflex microscopic     Status: Abnormal   Collection Time: 02/12/23  3:00 PM  Result Value Ref Range   Specific Gravity, UA 1.015 1.005 - 1.030   pH, UA 6.0 5.0 - 7.5   Color, UA Yellow Yellow   Appearance Ur Clear Clear   Leukocytes,UA 1+ (A) Negative   Protein,UA Trace Negative/Trace   Glucose, UA 3+ (A) Negative   Ketones, UA Negative Negative   RBC, UA Negative Negative    Bilirubin, UA Negative Negative   Urobilinogen, Ur 0.2 0.2 - 1.0 mg/dL   Nitrite, UA Negative Negative   Microscopic Examination See below:    Narrative   Performed at:  [address] - [facility] [address] [place], [place]  [id number] Lab Director: Chinita Pester MT, Phone:  [phone]  Microscopic Examination     Status: Abnormal   Collection Time: 02/12/23  3:00 PM   Urine  Result Value Ref Range   WBC, UA 11-30 (A) 0 - 5 /hpf   RBC, Urine 0-2 0 - 2 /hpf   Epithelial Cells (non renal) 0-10 0 - 10 /hpf   Bacteria, UA None seen None seen/Few   Narrative   Performed at:  [address] - [facility] [address] [place], [place]  [id number] Lab Director: Chinita Pester MT, Phone:  [phone]    UA has 11-30 WBC but no bacteria.     Assessment/Plan: BPH with LUTS.  He is doing well s/p TURP and urethral dilation.  but passed out and broke 3 ribs after surgery.  He will stop the tamsulosin and I told him not to resume the prostate supplements.   ED that is likely vascular with his long history of diabetes.  He remains interested in an IPP but wants to wait a while.     History of UTI.  He has WBC but no bacteria or symptoms.    No orders of the defined types were placed in this encounter.    Orders Placed This Encounter  Procedures   Microscopic Examination  Urinalysis, Routine w reflex microscopic     Return in about 3 months (around 05/15/2023).    CC: [facility]     Bjorn Pippin 02/13/2023 [phone] ID: Monna Fam, male   DOB: Jul 07, 1946, 77 y.o.   MRN: [id number]

## 2023-02-13 LAB — URINALYSIS, ROUTINE W REFLEX MICROSCOPIC
Bilirubin, UA: NEGATIVE
Ketones, UA: NEGATIVE
Nitrite, UA: NEGATIVE
RBC, UA: NEGATIVE
Specific Gravity, UA: 1.015 (ref 1.005–1.030)
Urobilinogen, Ur: 0.2 mg/dL (ref 0.2–1.0)
pH, UA: 6 (ref 5.0–7.5)

## 2023-02-13 LAB — MICROSCOPIC EXAMINATION: Bacteria, UA: NONE SEEN

## 2023-05-28 ENCOUNTER — Ambulatory Visit: Payer: Medicare HMO | Admitting: Urology

## 2024-07-16 ENCOUNTER — Emergency Department (HOSPITAL_COMMUNITY)

## 2024-07-16 ENCOUNTER — Encounter (HOSPITAL_COMMUNITY): Payer: Self-pay | Admitting: *Deleted

## 2024-07-16 ENCOUNTER — Other Ambulatory Visit: Payer: Self-pay

## 2024-07-16 ENCOUNTER — Inpatient Hospital Stay (HOSPITAL_COMMUNITY)
Admission: EM | Admit: 2024-07-16 | Discharge: 2024-08-22 | DRG: 981 | Disposition: E | Attending: Internal Medicine | Admitting: Internal Medicine

## 2024-07-16 DIAGNOSIS — S12600A Unspecified displaced fracture of seventh cervical vertebra, initial encounter for closed fracture: Secondary | ICD-10-CM | POA: Diagnosis present

## 2024-07-16 DIAGNOSIS — G9511 Acute infarction of spinal cord (embolic) (nonembolic): Secondary | ICD-10-CM | POA: Diagnosis present

## 2024-07-16 DIAGNOSIS — M549 Dorsalgia, unspecified: Secondary | ICD-10-CM | POA: Diagnosis present

## 2024-07-16 DIAGNOSIS — M79604 Pain in right leg: Secondary | ICD-10-CM | POA: Diagnosis present

## 2024-07-16 DIAGNOSIS — R490 Dysphonia: Secondary | ICD-10-CM | POA: Diagnosis present

## 2024-07-16 DIAGNOSIS — G9341 Metabolic encephalopathy: Secondary | ICD-10-CM | POA: Diagnosis present

## 2024-07-16 DIAGNOSIS — J189 Pneumonia, unspecified organism: Secondary | ICD-10-CM | POA: Diagnosis present

## 2024-07-16 DIAGNOSIS — Z9911 Dependence on respirator [ventilator] status: Secondary | ICD-10-CM

## 2024-07-16 DIAGNOSIS — I2694 Multiple subsegmental pulmonary emboli without acute cor pulmonale: Secondary | ICD-10-CM | POA: Diagnosis present

## 2024-07-16 DIAGNOSIS — R748 Abnormal levels of other serum enzymes: Secondary | ICD-10-CM | POA: Diagnosis present

## 2024-07-16 DIAGNOSIS — R6520 Severe sepsis without septic shock: Secondary | ICD-10-CM | POA: Diagnosis not present

## 2024-07-16 DIAGNOSIS — Z515 Encounter for palliative care: Secondary | ICD-10-CM

## 2024-07-16 DIAGNOSIS — M79605 Pain in left leg: Secondary | ICD-10-CM | POA: Diagnosis present

## 2024-07-16 DIAGNOSIS — Z539 Procedure and treatment not carried out, unspecified reason: Secondary | ICD-10-CM | POA: Diagnosis not present

## 2024-07-16 DIAGNOSIS — G4733 Obstructive sleep apnea (adult) (pediatric): Secondary | ICD-10-CM | POA: Diagnosis present

## 2024-07-16 DIAGNOSIS — S24101A Unspecified injury at T1 level of thoracic spinal cord, initial encounter: Secondary | ICD-10-CM | POA: Diagnosis present

## 2024-07-16 DIAGNOSIS — T508X5A Adverse effect of diagnostic agents, initial encounter: Secondary | ICD-10-CM | POA: Diagnosis present

## 2024-07-16 DIAGNOSIS — Z9889 Other specified postprocedural states: Secondary | ICD-10-CM

## 2024-07-16 DIAGNOSIS — M2578 Osteophyte, vertebrae: Secondary | ICD-10-CM | POA: Diagnosis present

## 2024-07-16 DIAGNOSIS — L89322 Pressure ulcer of left buttock, stage 2: Secondary | ICD-10-CM | POA: Diagnosis not present

## 2024-07-16 DIAGNOSIS — A419 Sepsis, unspecified organism: Secondary | ICD-10-CM | POA: Diagnosis not present

## 2024-07-16 DIAGNOSIS — W010XXA Fall on same level from slipping, tripping and stumbling without subsequent striking against object, initial encounter: Secondary | ICD-10-CM | POA: Diagnosis present

## 2024-07-16 DIAGNOSIS — R531 Weakness: Secondary | ICD-10-CM

## 2024-07-16 DIAGNOSIS — M4712 Other spondylosis with myelopathy, cervical region: Secondary | ICD-10-CM | POA: Diagnosis present

## 2024-07-16 DIAGNOSIS — S14107A Unspecified injury at C7 level of cervical spinal cord, initial encounter: Secondary | ICD-10-CM | POA: Diagnosis not present

## 2024-07-16 DIAGNOSIS — J9601 Acute respiratory failure with hypoxia: Secondary | ICD-10-CM | POA: Diagnosis present

## 2024-07-16 DIAGNOSIS — I97711 Intraoperative cardiac arrest during other surgery: Secondary | ICD-10-CM | POA: Diagnosis not present

## 2024-07-16 DIAGNOSIS — R34 Anuria and oliguria: Secondary | ICD-10-CM | POA: Diagnosis present

## 2024-07-16 DIAGNOSIS — E874 Mixed disorder of acid-base balance: Secondary | ICD-10-CM | POA: Diagnosis present

## 2024-07-16 DIAGNOSIS — E876 Hypokalemia: Secondary | ICD-10-CM | POA: Diagnosis present

## 2024-07-16 DIAGNOSIS — Z9079 Acquired absence of other genital organ(s): Secondary | ICD-10-CM

## 2024-07-16 DIAGNOSIS — N179 Acute kidney failure, unspecified: Secondary | ICD-10-CM

## 2024-07-16 DIAGNOSIS — Z7984 Long term (current) use of oral hypoglycemic drugs: Secondary | ICD-10-CM

## 2024-07-16 DIAGNOSIS — G8253 Quadriplegia, C5-C7 complete: Secondary | ICD-10-CM | POA: Diagnosis present

## 2024-07-16 DIAGNOSIS — Z8673 Personal history of transient ischemic attack (TIA), and cerebral infarction without residual deficits: Secondary | ICD-10-CM

## 2024-07-16 DIAGNOSIS — M4803 Spinal stenosis, cervicothoracic region: Secondary | ICD-10-CM | POA: Diagnosis present

## 2024-07-16 DIAGNOSIS — R197 Diarrhea, unspecified: Secondary | ICD-10-CM | POA: Diagnosis not present

## 2024-07-16 DIAGNOSIS — G931 Anoxic brain damage, not elsewhere classified: Secondary | ICD-10-CM | POA: Diagnosis present

## 2024-07-16 DIAGNOSIS — E875 Hyperkalemia: Secondary | ICD-10-CM | POA: Diagnosis present

## 2024-07-16 DIAGNOSIS — G8220 Paraplegia, unspecified: Secondary | ICD-10-CM | POA: Diagnosis not present

## 2024-07-16 DIAGNOSIS — Y92012 Bathroom of single-family (private) house as the place of occurrence of the external cause: Secondary | ICD-10-CM

## 2024-07-16 DIAGNOSIS — Z992 Dependence on renal dialysis: Secondary | ICD-10-CM

## 2024-07-16 DIAGNOSIS — I468 Cardiac arrest due to other underlying condition: Secondary | ICD-10-CM | POA: Diagnosis present

## 2024-07-16 DIAGNOSIS — Z794 Long term (current) use of insulin: Secondary | ICD-10-CM

## 2024-07-16 DIAGNOSIS — I5022 Chronic systolic (congestive) heart failure: Secondary | ICD-10-CM | POA: Diagnosis present

## 2024-07-16 DIAGNOSIS — Z7401 Bed confinement status: Secondary | ICD-10-CM | POA: Diagnosis not present

## 2024-07-16 DIAGNOSIS — I110 Hypertensive heart disease with heart failure: Secondary | ICD-10-CM | POA: Diagnosis present

## 2024-07-16 DIAGNOSIS — Z9289 Personal history of other medical treatment: Secondary | ICD-10-CM | POA: Diagnosis present

## 2024-07-16 DIAGNOSIS — E871 Hypo-osmolality and hyponatremia: Secondary | ICD-10-CM | POA: Diagnosis present

## 2024-07-16 DIAGNOSIS — Z86711 Personal history of pulmonary embolism: Secondary | ICD-10-CM

## 2024-07-16 DIAGNOSIS — M1990 Unspecified osteoarthritis, unspecified site: Secondary | ICD-10-CM | POA: Diagnosis present

## 2024-07-16 DIAGNOSIS — N39498 Other specified urinary incontinence: Secondary | ICD-10-CM | POA: Diagnosis present

## 2024-07-16 DIAGNOSIS — R570 Cardiogenic shock: Secondary | ICD-10-CM | POA: Diagnosis present

## 2024-07-16 DIAGNOSIS — N1411 Contrast-induced nephropathy: Secondary | ICD-10-CM | POA: Diagnosis present

## 2024-07-16 DIAGNOSIS — K7200 Acute and subacute hepatic failure without coma: Secondary | ICD-10-CM | POA: Diagnosis present

## 2024-07-16 DIAGNOSIS — S22019A Unspecified fracture of first thoracic vertebra, initial encounter for closed fracture: Secondary | ICD-10-CM | POA: Diagnosis present

## 2024-07-16 DIAGNOSIS — R1312 Dysphagia, oropharyngeal phase: Secondary | ICD-10-CM | POA: Diagnosis present

## 2024-07-16 DIAGNOSIS — K219 Gastro-esophageal reflux disease without esophagitis: Secondary | ICD-10-CM | POA: Diagnosis present

## 2024-07-16 DIAGNOSIS — R296 Repeated falls: Secondary | ICD-10-CM | POA: Diagnosis present

## 2024-07-16 DIAGNOSIS — F4321 Adjustment disorder with depressed mood: Secondary | ICD-10-CM | POA: Diagnosis present

## 2024-07-16 DIAGNOSIS — N401 Enlarged prostate with lower urinary tract symptoms: Secondary | ICD-10-CM | POA: Diagnosis present

## 2024-07-16 DIAGNOSIS — Z79899 Other long term (current) drug therapy: Secondary | ICD-10-CM

## 2024-07-16 DIAGNOSIS — E1165 Type 2 diabetes mellitus with hyperglycemia: Secondary | ICD-10-CM | POA: Diagnosis present

## 2024-07-16 DIAGNOSIS — Z66 Do not resuscitate: Secondary | ICD-10-CM | POA: Diagnosis present

## 2024-07-16 DIAGNOSIS — S1093XA Contusion of unspecified part of neck, initial encounter: Secondary | ICD-10-CM | POA: Diagnosis present

## 2024-07-16 DIAGNOSIS — J690 Pneumonitis due to inhalation of food and vomit: Secondary | ICD-10-CM | POA: Diagnosis present

## 2024-07-16 DIAGNOSIS — M6282 Rhabdomyolysis: Secondary | ICD-10-CM | POA: Diagnosis present

## 2024-07-16 DIAGNOSIS — Z880 Allergy status to penicillin: Secondary | ICD-10-CM

## 2024-07-16 DIAGNOSIS — S2243XA Multiple fractures of ribs, bilateral, initial encounter for closed fracture: Secondary | ICD-10-CM | POA: Diagnosis present

## 2024-07-16 DIAGNOSIS — N3941 Urge incontinence: Secondary | ICD-10-CM | POA: Diagnosis present

## 2024-07-16 DIAGNOSIS — D539 Nutritional anemia, unspecified: Secondary | ICD-10-CM | POA: Diagnosis present

## 2024-07-16 DIAGNOSIS — J9602 Acute respiratory failure with hypercapnia: Secondary | ICD-10-CM | POA: Diagnosis present

## 2024-07-16 DIAGNOSIS — N170 Acute kidney failure with tubular necrosis: Secondary | ICD-10-CM | POA: Diagnosis present

## 2024-07-16 DIAGNOSIS — Z7982 Long term (current) use of aspirin: Secondary | ICD-10-CM

## 2024-07-16 DIAGNOSIS — G629 Polyneuropathy, unspecified: Secondary | ICD-10-CM | POA: Diagnosis present

## 2024-07-16 LAB — COMPREHENSIVE METABOLIC PANEL WITH GFR
ALT: 20 U/L (ref 0–44)
AST: 46 U/L — ABNORMAL HIGH (ref 15–41)
Albumin: 4.3 g/dL (ref 3.5–5.0)
Alkaline Phosphatase: 103 U/L (ref 38–126)
Anion gap: 12 (ref 5–15)
BUN: 24 mg/dL — ABNORMAL HIGH (ref 8–23)
CO2: 30 mmol/L (ref 22–32)
Calcium: 10.2 mg/dL (ref 8.9–10.3)
Chloride: 91 mmol/L — ABNORMAL LOW (ref 98–111)
Creatinine, Ser: 1.08 mg/dL (ref 0.61–1.24)
GFR, Estimated: >60 mL/min (ref >60)
Glucose, Bld: 361 mg/dL — ABNORMAL HIGH (ref 70–99)
Potassium: 3.7 mmol/L (ref 3.5–5.1)
Sodium: 132 mmol/L — ABNORMAL LOW (ref 135–145)
Total Bilirubin: 0.7 mg/dL (ref 0.0–1.2)
Total Protein: 7.3 g/dL (ref 6.5–8.1)

## 2024-07-16 LAB — CBC WITH DIFFERENTIAL/PLATELET
Abs Immature Granulocytes: 0.04 K/uL (ref 0.00–0.07)
Basophils Absolute: 0 K/uL (ref 0.0–0.1)
Basophils Relative: 0 %
Eosinophils Absolute: 0 K/uL (ref 0.0–0.5)
Eosinophils Relative: 0 %
HCT: 40.6 % (ref 39.0–52.0)
Hemoglobin: 14.7 g/dL (ref 13.0–17.0)
Immature Granulocytes: 0 %
Lymphocytes Relative: 7 %
Lymphs Abs: 0.8 K/uL (ref 0.7–4.0)
MCH: 32.5 pg (ref 26.0–34.0)
MCHC: 36.2 g/dL — ABNORMAL HIGH (ref 30.0–36.0)
MCV: 89.6 fL (ref 80.0–100.0)
Monocytes Absolute: 0.8 K/uL (ref 0.1–1.0)
Monocytes Relative: 7 %
Neutro Abs: 10.3 K/uL — ABNORMAL HIGH (ref 1.7–7.7)
Neutrophils Relative %: 86 %
Platelets: 253 K/uL (ref 150–400)
RBC: 4.53 MIL/uL (ref 4.22–5.81)
RDW: 11.1 % — ABNORMAL LOW (ref 11.5–15.5)
WBC: 12 K/uL — ABNORMAL HIGH (ref 4.0–10.5)
nRBC: 0 % (ref 0.0–0.2)

## 2024-07-16 LAB — URINALYSIS, ROUTINE W REFLEX MICROSCOPIC
Bacteria, UA: NONE SEEN
Bilirubin Urine: NEGATIVE
Glucose, UA: >=500 mg/dL — AB
Ketones, ur: 5 mg/dL — AB
Leukocytes,Ua: NEGATIVE
Nitrite: NEGATIVE
Protein, ur: NEGATIVE mg/dL
Specific Gravity, Urine: 1.031 — ABNORMAL HIGH (ref 1.005–1.030)
pH: 7 (ref 5.0–8.0)

## 2024-07-16 LAB — CK: Total CK: 1321 U/L — ABNORMAL HIGH (ref 49–397)

## 2024-07-16 LAB — CBG MONITORING, ED: Glucose-Capillary: 422 mg/dL — ABNORMAL HIGH (ref 70–99)

## 2024-07-16 MED ORDER — SODIUM CHLORIDE 0.9 % IV SOLN: INTRAVENOUS | Status: DC | PRN

## 2024-07-16 MED ORDER — SODIUM CHLORIDE 0.9 % IV BOLUS
500.0000 mL | Freq: Once | INTRAVENOUS | Status: AC
  Administered 2024-07-16: 500 mL via INTRAVENOUS

## 2024-07-16 NOTE — ED Notes (Addendum)
 Upon PMS assessment the pt endorses he can not feel or move anything from the belly button down to his toes. This RN noted +3 dorsalis pedis pulse.   C spine precaution taken by staff x2 for cleaning pt and rolling pt for this assessment. Pt does not have control of his urination at this time. Pt endorses that this is new since he fell today.  Pt endorses to this RN that he has multiple falls and that over several days. He endorses that his legs were so weak that he was not able to pull himself to stand. Pt endorses that he is too weak to rise from sitting to stand position.

## 2024-07-16 NOTE — ED Notes (Signed)
 This tech and nurse got the patient out of the chair, patient could not bare weight at all even having to pick his legs up for him, patient was positive for urine incontinence during the transition. He then states I took 40 units of my insulin  because my eyes were blurry, checked his bag and states he turned the dial up to 38 on the insulin  pen and gave it to himself Lawrence [name]). CBG checked was 422.

## 2024-07-16 NOTE — ED Triage Notes (Signed)
 Patient is coming from [facility] transfer via [place]. Patient has been experiencing multiple falls and increased weakness bilaterally in both of his legs. Patient is currently having loss of sensation and numbness from his belt line to his mid calf. Patient does have sensation in his feet. Patient was positive for cervical fractures and has a c-collar on and is aligned. Currently in supine position. Here to see neurology and have MRI captured. No complaints of pain at this time.

## 2024-07-16 NOTE — ED Triage Notes (Signed)
 Pt with weakness to bilateral legs since Thursday.  This morning fell and unable to get up.  Son states pt has been falling multiple times recently. Pt admits to hitting his head with some of falls. Denies any blood thinners.

## 2024-07-16 NOTE — ED Notes (Signed)
 [facility] called for ED([facility]) to ED([facility]) transport

## 2024-07-16 NOTE — ED Provider Notes (Incomplete)
 Provider Note MRN:  [id number]  Arrival date & time: 07/17/24    ED Course and Medical Decision Making  Assumed care of patient at sign-out or upon transfer.  Fall with neck trauma and now having lack of strength and sensation to the legs, impaired sensation at the bellybutton area.  Neurosurgery was consulted during patient's evaluation at [facility], plan is for MRI imaging, hospitalist admission.  Patient continues to have impaired sensation and significant lack of strength on my exam.  12 AM update: I have confirmed that we are obtaining emergent, stat MRI imaging, on the way to MRI now.  I discussed the case with neurology, they have no role in management unless the MRI imaging is normal.  Suspicious for stenosis of the spine to acutely worsened by the fall.  1:25 AM update: I have had discussions with the hospitalist service as well as neurology, given the history and exam there is concern that patient needs emergent operative intervention or decompression given the MRI results, signs of cord compression in the cervical spine.  I have discussed the case with PA Johnanna who has reviewed the imaging with Dr. Debby of neurosurgery.  Neurosurgery believes that the imaging does not look that bad and patient's exam may not be fully explained by the trauma, possibly transverse myelitis.  The recommendation is hospitalist admission and repeat MRI at this time with contrast.  I have concerns that patient's exam which is basically new onset paraplegia that occurred directly after the trauma warrants emergent intervention and so I have asked the neurosurgery team to come and evaluate the patient in person as soon as possible, which they have agreed to do.  3 AM update: Patient was evaluated in person by PA Johnanna and MD Sebastian of neurosurgery is well aware of the patient.  The neurosurgery team is well aware of my concerns that patient needs to be considered for procedural intervention.   Plan by neurosurgery team is hospitalist admission and they will follow in consultation.  .Critical Care  Performed by: Theadore Ozell HERO, MD Authorized by: Theadore Ozell HERO, MD   Critical care provider statement:    Critical care time (minutes):  80   Critical care was necessary to treat or prevent imminent or life-threatening deterioration of the following conditions:  CNS failure or compromise   Critical care was time spent personally by me on the following activities:  Development of treatment plan with patient or surrogate, discussions with consultants, evaluation of patient's response to treatment, examination of patient, ordering and review of laboratory studies, ordering and review of radiographic studies, ordering and performing treatments and interventions, pulse oximetry, re-evaluation of patient's condition and review of old charts   I assumed direction of critical care for this patient from another provider in my specialty: yes     Care discussed with: admitting provider     Final Clinical Impressions(s) / ED Diagnoses     ICD-10-CM   1. 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 Weakness  R53.1     3. History of sensory changes  Z92.89     4. Elevated CK  R74.8       ED Discharge Orders     None       Discharge Instructions   None     Ozell HERO. Theadore, MD [facility] Emergency [facility] [email or web address]    Theadore Ozell HERO, MD 07/17/24 0128    Theadore Ozell HERO, MD 07/17/24 [phone]

## 2024-07-16 NOTE — ED Provider Notes (Signed)
 [facility] [facility] Provider Note   CSN: [id number] Arrival date & time: 07/16/24  1807     Patient presents with: Weakness   Lawrence [name] is a 78 y.o. male.    Weakness  Presents because of weakness.  Patient states that he was walking to the bathroom today when he subsequently fell and hit his neck and head on the sink.  Subsequent could not get up after this.  Fall happened around 8 AM.  Patient was not found until about noon by son.  Patient states that since then, he is not really been able to move his legs.  Patient states has been having numbness in his bilateral legs.  Patient states that he he always has numbness in the bilateral legs but seems to be worse. Patient states he is usually able to walk and sometimes uses a cane .  He did not lose consciousness.  Endorsing neck and back pain.  No numbness or tingling of his upper extremities.  Denies anticoagulation.  No fever no chills.  He does not think he is having kind of bowel bladder incontinence.  When discussing whether or not he has any kind of saddle anesthesia, he states he does not think so.    Previous medical history reviewed : Patient last admitted in 2024.  Cystoscopy with urethral dilation.     Prior to Admission medications   Medication Sig Start Date End Date Taking? Authorizing Provider  Apple Cider Vinegar 600 MG CAPS Take 600 mg by mouth daily.    [provider]  aspirin  EC 81 MG tablet Take 1 tablet (81 mg total) by mouth every other day. 11/14/21   Rehman, Claudis PENNER, MD  Barberry-Oreg Grape-Goldenseal (BERBERINE COMPLEX PO) Take 1 capsule by mouth daily.    [provider]  cholecalciferol (VITAMIN D3) 25 MCG (1000 UNIT) tablet Take 1,000 Units by mouth daily.    [provider]  Cinnamon 500 MG capsule Take 500 mg by mouth daily.    [provider]  diphenhydrAMINE HCl (NERVINE PO) Take 1 tablet by mouth daily. Patient states that he  takes one a day    [provider]  ferrous sulfate 325 (65 FE) MG tablet Take 325 mg by mouth daily with breakfast.    [provider]  hydrochlorothiazide  (HYDRODIURIL ) 25 MG tablet Take 25 mg by mouth daily. 07/23/21   [provider]  insulin  degludec (TRESIBA  FLEXTOUCH) 100 UNIT/ML FlexTouch Pen Inject 12 Units into the skin at bedtime. 10/07/22   Therisa Benton PARAS, NP  Multiple Vitamins-Minerals (OCUVITE ADULT 50+ PO) Take 1 tablet by mouth daily.    [provider]  OVER THE COUNTER MEDICATION Take 1 capsule by mouth daily. Prostagenix    [provider]  OVER THE COUNTER MEDICATION Take 1 capsule by mouth daily. Diabacore (Diabetic Supplement)    [provider]  OVER THE COUNTER MEDICATION Take 1 tablet by mouth in the morning and at bedtime. Glucoceil    [provider]  tadalafil  (CIALIS ) 5 MG tablet Take 1 tablet (5 mg total) by mouth daily as needed for erectile dysfunction. 12/06/20   Wrenn, John, MD  vitamin E 180 MG (400 UNITS) capsule Take 400 Units by mouth daily.    [provider]  Vitamin Mixture (ESTER-C PO) Take 1,000 mg by mouth daily.    [provider]    Allergies: Penicillins and Lucentis [ranibizumab]    Review of Systems  Neurological:  Positive for weakness.    Updated Vital Signs BP (!) 140/74 (BP Location: Left Arm)   Pulse 89   Temp 97.8 F (36.6 C) (Oral)   Resp 16   Ht 6' 1 (1.854 m)   Wt 79.4 kg   SpO2 100%   BMI 23.09 kg/m   Physical Exam Vitals and nursing note reviewed.  Constitutional:      General: He is not in acute distress.    Appearance: He is well-developed.  HENT:     Head: Normocephalic and atraumatic.  Eyes:     Conjunctiva/sclera: Conjunctivae normal.  Cardiovascular:     Rate and Rhythm: Normal rate and regular rhythm.     Heart sounds: No murmur heard. Pulmonary:     Effort: Pulmonary effort is normal. No respiratory distress.     Breath  sounds: Normal breath sounds.  Abdominal:     Palpations: Abdomen is soft.     Tenderness: There is no abdominal tenderness.  Musculoskeletal:        General: No swelling.     Cervical back: Neck supple.  Skin:    General: Skin is warm and dry.     Capillary Refill: Capillary refill takes less than 2 seconds.  Neurological:     Mental Status: He is alert and oriented to person, place, and time. Mental status is at baseline.     GCS: GCS eye subscore is 4. GCS verbal subscore is 5. GCS motor subscore is 6.     Cranial Nerves: Cranial nerves 2-12 are intact. No cranial nerve deficit or dysarthria.     Comments: Patient has sensory deficit from the umbilicus to bilateral LE. 0/5 strength at the hip, knee bilaterally. No pronation/supination.   Abnormal rectal tone.   Psychiatric:        Mood and Affect: Mood normal.     (all labs ordered are listed, but only abnormal results are displayed) Labs Reviewed  CBC WITH DIFFERENTIAL/PLATELET - Abnormal; Notable for the following components:      Result Value   WBC 12.0 (*)    MCHC 36.2 (*)    RDW 11.1 (*)    Neutro Abs 10.3 (*)    All other components within normal limits  COMPREHENSIVE METABOLIC PANEL WITH GFR - Abnormal; Notable for the following components:   Sodium 132 (*)    Chloride 91 (*)    Glucose, Bld 361 (*)    BUN 24 (*)    AST 46 (*)    All other components within normal limits  URINALYSIS, ROUTINE W REFLEX MICROSCOPIC - Abnormal; Notable for the following components:   Color, Urine STRAW (*)    Specific Gravity, Urine 1.031 (*)    Glucose, UA >=500 (*)    Hgb urine dipstick SMALL (*)    Ketones, ur 5 (*)    All other components within normal limits  CK - Abnormal; Notable for the following components:   Total CK 1,321 (*)    All other components within normal limits  CBG MONITORING, ED - Abnormal; Notable for the following components:   Glucose-Capillary 422 (*)    All other components within normal limits  CBG  MONITORING, ED    EKG: EKG Interpretation Date/Time:  Saturday July 16 2024 18:28:28 EDT Ventricular Rate:  79 PR Interval:  216 QRS Duration:  89 QT Interval:  391 QTC Calculation: 449 R Axis:   90  Text Interpretation: Sinus rhythm Borderline prolonged PR interval Borderline right axis  deviation No changes compared to prior Confirmed by Simon Rea [phone]) on 07/16/2024 6:37:39 PM  Radiology: CT Head Wo Contrast Result Date: 07/16/2024 EXAM: CT HEAD WITHOUT CONTRAST 07/16/2024 07:29:51 PM TECHNIQUE: CT of the head was performed without the administration of intravenous contrast. Automated exposure control, iterative reconstruction, and/or weight based adjustment of the mA/kV was utilized to reduce the radiation dose to as low as reasonably achievable. COMPARISON: None available. CLINICAL HISTORY: Polytrauma, blunt. weakness to bilateral legs since Thursday. This morning fell and unable to get up. Son states pt has been falling multiple times recently. Pt admits to hitting his head with some of falls. Denies any blood thinners. FINDINGS: BRAIN AND VENTRICLES: No acute hemorrhage. No evidence of acute infarct. No hydrocephalus. No extra-axial collection. No mass effect or midline shift. Mild generalized atrophy and white matter disease. Atherosclerotic calcifications in bilateral cavernous carotid arteries. ORBITS: No acute abnormality. Bilateral lens replacements noted. SINUSES: No acute abnormality. SOFT TISSUES AND SKULL: No acute soft tissue abnormality. No skull fracture. IMPRESSION: 1. No acute intracranial abnormality. Electronically signed by: Pinkie Pebbles MD 07/16/2024 07:40 PM EDT RP Workstation: [id number]   CT Thoracic Spine Wo Contrast Result Date: 07/16/2024 EXAM: CT THORACIC SPINE WITHOUT CONTRAST 07/16/2024 07:29:51 PM TECHNIQUE: CT of the thoracic spine was performed without the administration of intravenous contrast. Multiplanar reformatted images are provided for  review. Automated exposure control, iterative reconstruction, and/or weight based adjustment of the mA/kV was utilized to reduce the radiation dose to as low as reasonably achievable. COMPARISON: None available. CLINICAL HISTORY: Back trauma, no prior imaging (Age >= 16y). weakness to bilateral legs since Thursday. This morning fell and unable to get up. Son states pt has been falling multiple times recently. Pt admits to hitting his head with some of falls. Denies any blood thinners. FINDINGS: BONES AND ALIGNMENT: Normal vertebral body heights. No acute fracture or suspicious bone lesion. Normal alignment. DEGENERATIVE CHANGES: Mild multilevel degenerative changes with bulky anterior osteophytosis. SOFT TISSUES: No acute abnormality. VASCULATURE: Mild thoracic aortic atherosclerosis. IMPRESSION: 1. No acute abnormality of the thoracic spine. Electronically signed by: Pinkie Pebbles MD 07/16/2024 07:39 PM EDT RP Workstation: [id number]   CT Lumbar Spine Wo Contrast Result Date: 07/16/2024 EXAM: CT OF THE LUMBAR SPINE WITHOUT CONTRAST 07/16/2024 07:29:51 PM TECHNIQUE: CT of the lumbar spine was performed without the administration of intravenous contrast. Multiplanar reformatted images are provided for review. Automated exposure control, iterative reconstruction, and/or weight based adjustment of the mA/kV was utilized to reduce the radiation dose to as low as reasonably achievable. COMPARISON: None available. CLINICAL HISTORY: Lumbar radiculopathy, trauma. weakness to bilateral legs since Thursday. This morning fell and unable to get up. Son states pt has been falling multiple times recently. Pt admits to hitting his head with some of falls. Denies any blood thinners. FINDINGS: BONES AND ALIGNMENT: Normal vertebral body heights. No acute fracture or suspicious bone lesion. Normal alignment. DEGENERATIVE CHANGES: Mild multilevel degenerative changes, most prominent at L5-S1, with bulky osteophytosis involving  the lower lumbar spine. SOFT TISSUES: Vascular calcifications. IMPRESSION: 1. No acute osseous abnormality. Electronically signed by: Pinkie Pebbles MD 07/16/2024 07:37 PM EDT RP Workstation: [id number]   CT Cervical Spine Wo Contrast Result Date: 07/16/2024 EXAM: CT CERVICAL SPINE WITHOUT CONTRAST 07/16/2024 07:29:51 PM TECHNIQUE: CT of the cervical spine was performed without the administration of intravenous contrast. Multiplanar reformatted images are provided for review. Automated exposure control, iterative reconstruction, and/or weight based adjustment of the mA/kV was utilized to reduce the radiation dose to  as low as reasonably achievable. COMPARISON: None available. CLINICAL HISTORY: Polytrauma, blunt. weakness to bilateral legs since Thursday. This morning fell and unable to get up. Son states pt has been falling multiple times recently. Pt admits to hitting his head with some of falls. Denies any blood thinners. FINDINGS: CERVICAL SPINE: BONES AND ALIGNMENT: Mildly displaced fracture of the left transverse process at C7 (coronal image 51). Vertebral body heights are maintained. DEGENERATIVE CHANGES: Mild to moderate multilevel degenerative changes with bulky anterior osteophytosis along the lower cervical / upper thoracic spine. SOFT TISSUES: No prevertebral soft tissue swelling. IMPRESSION: 1. Mildly displaced fracture of the left transverse process at C7. Electronically signed by: Pinkie Pebbles MD 07/16/2024 07:36 PM EDT RP Workstation: [id number]     Procedures   Medications Ordered in the ED  0.9 %  sodium chloride  infusion (has no administration in time range)  sodium chloride  0.9 % bolus 500 mL (500 mLs Intravenous New Bag/Given 07/16/24 2017)  sodium chloride  0.9 % bolus 500 mL (500 mLs Intravenous New Bag/Given 07/16/24 2059)    Clinical Course as of 07/16/24 2112  Sat Jul 16, 2024  2028 Start MRI w/o C/T/L.  [TL]    Clinical Course User Index [TL] Simon Lavonia SAILOR, MD                                  Medical Decision Making Amount and/or Complexity of Data Reviewed Labs: ordered. Radiology: ordered.  Risk Prescription drug management. Decision regarding hospitalization.      HPI:    Presents because of weakness.  Patient states that he was walking to the bathroom today when he subsequently fell and hit his neck and head on the sink.  Subsequent could not get up after this.  Fall happened around 8 AM.  Patient was not found until about noon by son.  Patient states that since then, he is not really been able to move his legs.  Patient states has been having numbness in his bilateral legs.  Patient states that he he always has numbness in the bilateral legs but seems to be worse.  Patient states he is usually able to walk and sometimes uses a cane. SABRA  He did not lose consciousness.  Endorsing neck and back pain.  No numbness or tingling of his upper extremities.  Denies anticoagulation.  No fever no chills.  He does not think he is having kind of bowel bladder incontinence.  When discussing whether or not he has any kind of saddle anesthesia, he states he does not think so.    Previous medical history reviewed : Patient last admitted in 2024.  Cystoscopy with urethral dilation.   MDM:   Upon exam, patient ANO x 3 with GCS 15.  No focal deficits of the face.  Sensation intact in bilateral upper extremities.  Patient has generalized pain to palpation from the cervical thoracic and lumbar vertebrae.  Patient endorsing sensory changes of bilateral lower extremities.  0-5 strength in bilateral lower extremities without any Evidence of intact plantar or dorsiflexion.  Patient has sensory deficits from the umbilicus down.  Abnormal rectal tone as well.  unable to contract glutes.  No IV drug abuse history. No history of immunocompromised state. No risk factors for spont hemorrhage or abscess of spine  at this time.   Reevaluation:   Upon reexamination,  patient hemodynamically stable.  Remains A&O x 3 with GCS 15.  CT imaging of the thoracic or lumbar spine benign.  CT of the cervical spine showed mild transverse process fracture at C7.  Otherwise, no cervical injury.  CT head benign.   Consulted neurosurgery.  Recommended MRI cervical thoracic and lumbar spine without contrast at this time.  Would likely be admitted to medicine.  They plan on seeing the patient in the morning.   When speaking to the patient further as well as son.  It sounds like maybe some of his numbness and tingling start about 3 weeks ago.  No viral illness before this.  No GI illness before this.  Patient states that seem to have started in his knees and ankles and subsequently progressed.  I think it is reasonable to continue with workup with MRI spine before proceeding to LP to rule out any kind of evidence of GBS.  No meningeal signs.  No concerns for meningitis encephalitis at this time.  Normal chin to chest.   Patient accepted for ED to ED transfer to obtain MRI cervical thoracic and lumbar spine.   CK is slightly elevated.  Patient started on a liter of fluid.  Will need to be closely monitor.  Will start him on maintenance IV fluid as well.   Patient remained in full spinal precautions with Cervical collar in place once I saw the patient. All turns were made with C spine stabilized.   Interventions: 1 L NS, NS @ 125 cc/hr   EKG Interpreted by Me: NSR   Cardiac Tele Interpreted by Me: NSR   I have independently interpreted the  CT  images and agree with the radiologist finding   Social Determinant of Health: None    Disposition and Follow Up: transfer  CRITICAL CARE Performed by: Lavonia LOISE Pat   Total critical care time: 45 minutes  Critical care time was exclusive of separately billable procedures and treating other patients.  Critical care was necessary to treat or prevent imminent or life-threatening deterioration.  Critical care was time  spent personally by me on the following activities: development of treatment plan with patient and/or surrogate as well as nursing, discussions with consultants, evaluation of patient's response to treatment, examination of patient, obtaining history from patient or surrogate, ordering and performing treatments and interventions, ordering and review of laboratory studies, ordering and review of radiographic studies, pulse oximetry and re-evaluation of patient's condition.      Final diagnoses:  Paraplegia (HCC)  Weakness  History of sensory changes  Elevated CK    ED Discharge Orders     None          Pat Lavonia LOISE, MD 07/16/24 2112    Pat Lavonia LOISE, MD 07/17/24 1145

## 2024-07-17 ENCOUNTER — Encounter (HOSPITAL_COMMUNITY): Payer: Self-pay | Admitting: Internal Medicine

## 2024-07-17 ENCOUNTER — Inpatient Hospital Stay (HOSPITAL_COMMUNITY)

## 2024-07-17 ENCOUNTER — Inpatient Hospital Stay (HOSPITAL_COMMUNITY): Admitting: Certified Registered Nurse Anesthetist

## 2024-07-17 ENCOUNTER — Encounter (HOSPITAL_COMMUNITY): Admission: EM | Disposition: E | Payer: Self-pay | Source: Home / Self Care

## 2024-07-17 ENCOUNTER — Emergency Department (HOSPITAL_COMMUNITY)

## 2024-07-17 DIAGNOSIS — I10 Essential (primary) hypertension: Secondary | ICD-10-CM | POA: Diagnosis not present

## 2024-07-17 DIAGNOSIS — G8253 Quadriplegia, C5-C7 complete: Secondary | ICD-10-CM | POA: Diagnosis present

## 2024-07-17 DIAGNOSIS — G931 Anoxic brain damage, not elsewhere classified: Secondary | ICD-10-CM | POA: Diagnosis not present

## 2024-07-17 DIAGNOSIS — J9601 Acute respiratory failure with hypoxia: Secondary | ICD-10-CM

## 2024-07-17 DIAGNOSIS — J189 Pneumonia, unspecified organism: Secondary | ICD-10-CM | POA: Diagnosis present

## 2024-07-17 DIAGNOSIS — E875 Hyperkalemia: Secondary | ICD-10-CM | POA: Diagnosis not present

## 2024-07-17 DIAGNOSIS — S2231XA Fracture of one rib, right side, initial encounter for closed fracture: Secondary | ICD-10-CM | POA: Diagnosis not present

## 2024-07-17 DIAGNOSIS — G959 Disease of spinal cord, unspecified: Secondary | ICD-10-CM

## 2024-07-17 DIAGNOSIS — E119 Type 2 diabetes mellitus without complications: Secondary | ICD-10-CM | POA: Diagnosis not present

## 2024-07-17 DIAGNOSIS — E1165 Type 2 diabetes mellitus with hyperglycemia: Secondary | ICD-10-CM | POA: Diagnosis not present

## 2024-07-17 DIAGNOSIS — J9602 Acute respiratory failure with hypercapnia: Secondary | ICD-10-CM

## 2024-07-17 DIAGNOSIS — K7200 Acute and subacute hepatic failure without coma: Secondary | ICD-10-CM | POA: Diagnosis present

## 2024-07-17 DIAGNOSIS — S24101A Unspecified injury at T1 level of thoracic spinal cord, initial encounter: Secondary | ICD-10-CM | POA: Diagnosis present

## 2024-07-17 DIAGNOSIS — J690 Pneumonitis due to inhalation of food and vomit: Secondary | ICD-10-CM | POA: Diagnosis present

## 2024-07-17 DIAGNOSIS — Z9889 Other specified postprocedural states: Secondary | ICD-10-CM

## 2024-07-17 DIAGNOSIS — I2694 Multiple subsegmental pulmonary emboli without acute cor pulmonale: Secondary | ICD-10-CM | POA: Diagnosis present

## 2024-07-17 DIAGNOSIS — G9511 Acute infarction of spinal cord (embolic) (nonembolic): Secondary | ICD-10-CM | POA: Diagnosis present

## 2024-07-17 DIAGNOSIS — A419 Sepsis, unspecified organism: Secondary | ICD-10-CM | POA: Diagnosis not present

## 2024-07-17 DIAGNOSIS — I2699 Other pulmonary embolism without acute cor pulmonale: Secondary | ICD-10-CM | POA: Diagnosis not present

## 2024-07-17 DIAGNOSIS — Z538 Procedure and treatment not carried out for other reasons: Secondary | ICD-10-CM

## 2024-07-17 DIAGNOSIS — N179 Acute kidney failure, unspecified: Secondary | ICD-10-CM | POA: Diagnosis not present

## 2024-07-17 DIAGNOSIS — S14107D Unspecified injury at C7 level of cervical spinal cord, subsequent encounter: Secondary | ICD-10-CM | POA: Diagnosis not present

## 2024-07-17 DIAGNOSIS — E876 Hypokalemia: Secondary | ICD-10-CM

## 2024-07-17 DIAGNOSIS — R579 Shock, unspecified: Secondary | ICD-10-CM | POA: Diagnosis not present

## 2024-07-17 DIAGNOSIS — Y92012 Bathroom of single-family (private) house as the place of occurrence of the external cause: Secondary | ICD-10-CM | POA: Diagnosis not present

## 2024-07-17 DIAGNOSIS — G9341 Metabolic encephalopathy: Secondary | ICD-10-CM | POA: Diagnosis present

## 2024-07-17 DIAGNOSIS — R9431 Abnormal electrocardiogram [ECG] [EKG]: Secondary | ICD-10-CM | POA: Diagnosis not present

## 2024-07-17 DIAGNOSIS — Z66 Do not resuscitate: Secondary | ICD-10-CM | POA: Diagnosis present

## 2024-07-17 DIAGNOSIS — S14107A Unspecified injury at C7 level of cervical spinal cord, initial encounter: Secondary | ICD-10-CM | POA: Diagnosis present

## 2024-07-17 DIAGNOSIS — R6520 Severe sepsis without septic shock: Secondary | ICD-10-CM | POA: Diagnosis not present

## 2024-07-17 DIAGNOSIS — G8220 Paraplegia, unspecified: Principal | ICD-10-CM

## 2024-07-17 DIAGNOSIS — R569 Unspecified convulsions: Secondary | ICD-10-CM | POA: Diagnosis not present

## 2024-07-17 DIAGNOSIS — I468 Cardiac arrest due to other underlying condition: Secondary | ICD-10-CM | POA: Diagnosis present

## 2024-07-17 DIAGNOSIS — Z515 Encounter for palliative care: Secondary | ICD-10-CM | POA: Diagnosis not present

## 2024-07-17 DIAGNOSIS — S2243XA Multiple fractures of ribs, bilateral, initial encounter for closed fracture: Secondary | ICD-10-CM | POA: Diagnosis present

## 2024-07-17 DIAGNOSIS — N170 Acute kidney failure with tubular necrosis: Secondary | ICD-10-CM | POA: Diagnosis present

## 2024-07-17 DIAGNOSIS — R1310 Dysphagia, unspecified: Secondary | ICD-10-CM | POA: Diagnosis not present

## 2024-07-17 DIAGNOSIS — I5082 Biventricular heart failure: Secondary | ICD-10-CM | POA: Diagnosis not present

## 2024-07-17 DIAGNOSIS — S2242XA Multiple fractures of ribs, left side, initial encounter for closed fracture: Secondary | ICD-10-CM | POA: Diagnosis not present

## 2024-07-17 DIAGNOSIS — S22019A Unspecified fracture of first thoracic vertebra, initial encounter for closed fracture: Secondary | ICD-10-CM | POA: Diagnosis present

## 2024-07-17 DIAGNOSIS — I5021 Acute systolic (congestive) heart failure: Secondary | ICD-10-CM

## 2024-07-17 DIAGNOSIS — I469 Cardiac arrest, cause unspecified: Secondary | ICD-10-CM | POA: Diagnosis not present

## 2024-07-17 DIAGNOSIS — R578 Other shock: Secondary | ICD-10-CM | POA: Diagnosis not present

## 2024-07-17 DIAGNOSIS — I110 Hypertensive heart disease with heart failure: Secondary | ICD-10-CM | POA: Diagnosis present

## 2024-07-17 DIAGNOSIS — R570 Cardiogenic shock: Secondary | ICD-10-CM | POA: Diagnosis present

## 2024-07-17 DIAGNOSIS — W010XXA Fall on same level from slipping, tripping and stumbling without subsequent striking against object, initial encounter: Secondary | ICD-10-CM | POA: Diagnosis present

## 2024-07-17 DIAGNOSIS — R509 Fever, unspecified: Secondary | ICD-10-CM | POA: Diagnosis not present

## 2024-07-17 DIAGNOSIS — Z992 Dependence on renal dialysis: Secondary | ICD-10-CM | POA: Diagnosis not present

## 2024-07-17 DIAGNOSIS — S12600A Unspecified displaced fracture of seventh cervical vertebra, initial encounter for closed fracture: Secondary | ICD-10-CM | POA: Diagnosis present

## 2024-07-17 DIAGNOSIS — I2602 Saddle embolus of pulmonary artery with acute cor pulmonale: Secondary | ICD-10-CM | POA: Diagnosis not present

## 2024-07-17 DIAGNOSIS — Z9911 Dependence on respirator [ventilator] status: Secondary | ICD-10-CM | POA: Diagnosis not present

## 2024-07-17 HISTORY — PX: ANTERIOR CERVICAL DECOMP/DISCECTOMY FUSION: SHX1161

## 2024-07-17 LAB — SURGICAL PCR SCREEN
MRSA, PCR: NEGATIVE
Staphylococcus aureus: NEGATIVE

## 2024-07-17 LAB — POCT I-STAT 7, (LYTES, BLD GAS, ICA,H+H)
Acid-base deficit: 12 mmol/L — ABNORMAL HIGH (ref 0.0–2.0)
Acid-base deficit: 1 mmol/L (ref 0.0–2.0)
Acid-base deficit: 10 mmol/L — ABNORMAL HIGH (ref 0.0–2.0)
Acid-base deficit: 6 mmol/L — ABNORMAL HIGH (ref 0.0–2.0)
Bicarbonate: 18.6 mmol/L — ABNORMAL LOW (ref 20.0–28.0)
Bicarbonate: 18.9 mmol/L — ABNORMAL LOW (ref 20.0–28.0)
Bicarbonate: 25.9 mmol/L (ref 20.0–28.0)
Bicarbonate: 19 mmol/L — ABNORMAL LOW (ref 20.0–28.0)
Calcium, Ion: 1.21 mmol/L (ref 1.15–1.40)
Calcium, Ion: 1.12 mmol/L — ABNORMAL LOW (ref 1.15–1.40)
Calcium, Ion: 1.98 mmol/L (ref 1.15–1.40)
Calcium, Ion: 1.24 mmol/L (ref 1.15–1.40)
HCT: 30 % — ABNORMAL LOW (ref 39.0–52.0)
HCT: 27 % — ABNORMAL LOW (ref 39.0–52.0)
HCT: 28 % — ABNORMAL LOW (ref 39.0–52.0)
HCT: 32 % — ABNORMAL LOW (ref 39.0–52.0)
Hemoglobin: 10.2 g/dL — ABNORMAL LOW (ref 13.0–17.0)
Hemoglobin: 9.2 g/dL — ABNORMAL LOW (ref 13.0–17.0)
Hemoglobin: 9.5 g/dL — ABNORMAL LOW (ref 13.0–17.0)
Hemoglobin: 10.9 g/dL — ABNORMAL LOW (ref 13.0–17.0)
O2 Saturation: 100 %
O2 Saturation: 100 %
O2 Saturation: 100 %
O2 Saturation: 98 %
Patient temperature: 34.4
Patient temperature: 94
Patient temperature: 96.5
Potassium: 4.5 mmol/L (ref 3.5–5.1)
Potassium: 3.3 mmol/L — ABNORMAL LOW (ref 3.5–5.1)
Potassium: 4.2 mmol/L (ref 3.5–5.1)
Potassium: 3.5 mmol/L (ref 3.5–5.1)
Sodium: 136 mmol/L (ref 135–145)
Sodium: 137 mmol/L (ref 135–145)
Sodium: 139 mmol/L (ref 135–145)
Sodium: 138 mmol/L (ref 135–145)
TCO2: 21 mmol/L — ABNORMAL LOW (ref 22–32)
TCO2: 20 mmol/L — ABNORMAL LOW (ref 22–32)
TCO2: 28 mmol/L (ref 22–32)
TCO2: 20 mmol/L — ABNORMAL LOW (ref 22–32)
pCO2 arterial: 70.5 mmHg (ref 32–48)
pCO2 arterial: 47.4 mmHg (ref 32–48)
pCO2 arterial: 50.4 mmHg — ABNORMAL HIGH (ref 32–48)
pCO2 arterial: 33.4 mmHg (ref 32–48)
pH, Arterial: 7.029 — CL (ref 7.35–7.45)
pH, Arterial: 7.193 — CL (ref 7.35–7.45)
pH, Arterial: 7.305 — ABNORMAL LOW (ref 7.35–7.45)
pH, Arterial: 7.358 (ref 7.35–7.45)
pO2, Arterial: 345 mmHg — ABNORMAL HIGH (ref 83–108)
pO2, Arterial: 284 mmHg — ABNORMAL HIGH (ref 83–108)
pO2, Arterial: 314 mmHg — ABNORMAL HIGH (ref 83–108)
pO2, Arterial: 105 mmHg (ref 83–108)

## 2024-07-17 LAB — CBC
HCT: 35.6 % — ABNORMAL LOW (ref 39.0–52.0)
HCT: 31.5 % — ABNORMAL LOW (ref 39.0–52.0)
HCT: 38.5 % — ABNORMAL LOW (ref 39.0–52.0)
Hemoglobin: 12.4 g/dL — ABNORMAL LOW (ref 13.0–17.0)
Hemoglobin: 10.5 g/dL — ABNORMAL LOW (ref 13.0–17.0)
Hemoglobin: 13.4 g/dL (ref 13.0–17.0)
MCH: 31.9 pg (ref 26.0–34.0)
MCH: 31.9 pg (ref 26.0–34.0)
MCH: 32.2 pg (ref 26.0–34.0)
MCHC: 34.8 g/dL (ref 30.0–36.0)
MCHC: 33.3 g/dL (ref 30.0–36.0)
MCHC: 34.8 g/dL (ref 30.0–36.0)
MCV: 91.5 fL (ref 80.0–100.0)
MCV: 95.7 fL (ref 80.0–100.0)
MCV: 92.5 fL (ref 80.0–100.0)
Platelets: 208 K/uL (ref 150–400)
Platelets: 86 K/uL — ABNORMAL LOW (ref 150–400)
Platelets: 211 K/uL (ref 150–400)
RBC: 3.89 MIL/uL — ABNORMAL LOW (ref 4.22–5.81)
RBC: 3.29 MIL/uL — ABNORMAL LOW (ref 4.22–5.81)
RBC: 4.16 MIL/uL — ABNORMAL LOW (ref 4.22–5.81)
RDW: 11.3 % — ABNORMAL LOW (ref 11.5–15.5)
RDW: 11.4 % — ABNORMAL LOW (ref 11.5–15.5)
RDW: 11.3 % — ABNORMAL LOW (ref 11.5–15.5)
WBC: 9.7 K/uL (ref 4.0–10.5)
WBC: 12 K/uL — ABNORMAL HIGH (ref 4.0–10.5)
WBC: 22.4 K/uL — ABNORMAL HIGH (ref 4.0–10.5)
nRBC: 0 % (ref 0.0–0.2)
nRBC: 0 % (ref 0.0–0.2)
nRBC: 0 % (ref 0.0–0.2)

## 2024-07-17 LAB — BASIC METABOLIC PANEL WITH GFR
Anion gap: 13 (ref 5–15)
Anion gap: 18 — ABNORMAL HIGH (ref 5–15)
Anion gap: 21 — ABNORMAL HIGH (ref 5–15)
BUN: 22 mg/dL (ref 8–23)
BUN: 25 mg/dL — ABNORMAL HIGH (ref 8–23)
BUN: 35 mg/dL — ABNORMAL HIGH (ref 8–23)
CO2: 24 mmol/L (ref 22–32)
CO2: 18 mmol/L — ABNORMAL LOW (ref 22–32)
CO2: 19 mmol/L — ABNORMAL LOW (ref 22–32)
Calcium: 9 mg/dL (ref 8.9–10.3)
Calcium: 7.8 mg/dL — ABNORMAL LOW (ref 8.9–10.3)
Calcium: 9.3 mg/dL (ref 8.9–10.3)
Chloride: 98 mmol/L (ref 98–111)
Chloride: 101 mmol/L (ref 98–111)
Chloride: 98 mmol/L (ref 98–111)
Creatinine, Ser: 0.99 mg/dL (ref 0.61–1.24)
Creatinine, Ser: 1.5 mg/dL — ABNORMAL HIGH (ref 0.61–1.24)
Creatinine, Ser: 2.37 mg/dL — ABNORMAL HIGH (ref 0.61–1.24)
GFR, Estimated: >60 mL/min (ref >60)
GFR, Estimated: 48 mL/min — ABNORMAL LOW (ref >60)
GFR, Estimated: 28 mL/min — ABNORMAL LOW (ref >60)
Glucose, Bld: 180 mg/dL — ABNORMAL HIGH (ref 70–99)
Glucose, Bld: 286 mg/dL — ABNORMAL HIGH (ref 70–99)
Glucose, Bld: 393 mg/dL — ABNORMAL HIGH (ref 70–99)
Potassium: 3.6 mmol/L (ref 3.5–5.1)
Potassium: 3.4 mmol/L — ABNORMAL LOW (ref 3.5–5.1)
Potassium: 4.1 mmol/L (ref 3.5–5.1)
Sodium: 135 mmol/L (ref 135–145)
Sodium: 137 mmol/L (ref 135–145)
Sodium: 138 mmol/L (ref 135–145)

## 2024-07-17 LAB — GLUCOSE, CAPILLARY
Glucose-Capillary: 152 mg/dL — ABNORMAL HIGH (ref 70–99)
Glucose-Capillary: 285 mg/dL — ABNORMAL HIGH (ref 70–99)
Glucose-Capillary: 303 mg/dL — ABNORMAL HIGH (ref 70–99)
Glucose-Capillary: 263 mg/dL — ABNORMAL HIGH (ref 70–99)

## 2024-07-17 LAB — CBG MONITORING, ED
Glucose-Capillary: 178 mg/dL — ABNORMAL HIGH (ref 70–99)
Glucose-Capillary: 176 mg/dL — ABNORMAL HIGH (ref 70–99)

## 2024-07-17 LAB — PHOSPHORUS: Phosphorus: 3.4 mg/dL (ref 2.5–4.6)

## 2024-07-17 LAB — C-REACTIVE PROTEIN: CRP: 3.6 mg/dL — ABNORMAL HIGH (ref <1.0)

## 2024-07-17 LAB — HEPARIN LEVEL (UNFRACTIONATED): Heparin Unfractionated: <0.1 [IU]/mL — ABNORMAL LOW (ref 0.30–0.70)

## 2024-07-17 LAB — MAGNESIUM
Magnesium: 2.2 mg/dL (ref 1.7–2.4)
Magnesium: 2 mg/dL (ref 1.7–2.4)

## 2024-07-17 LAB — CG4 I-STAT (LACTIC ACID)
Lactic Acid, Venous: 11.6 mmol/L (ref 0.5–1.9)
Lactic Acid, Venous: 8.6 mmol/L (ref 0.5–1.9)

## 2024-07-17 LAB — HEMOGLOBIN A1C
Hgb A1c MFr Bld: 12.3 % — ABNORMAL HIGH (ref 4.8–5.6)
Mean Plasma Glucose: 306.31 mg/dL

## 2024-07-17 LAB — CK: Total CK: 938 U/L — ABNORMAL HIGH (ref 49–397)

## 2024-07-17 LAB — PROTIME-INR
INR: 1 (ref 0.8–1.2)
INR: 1.7 — ABNORMAL HIGH (ref 0.8–1.2)
INR: 1.7 — ABNORMAL HIGH (ref 0.8–1.2)
Prothrombin Time: 14.1 s (ref 11.4–15.2)
Prothrombin Time: 20.4 s — ABNORMAL HIGH (ref 11.4–15.2)
Prothrombin Time: 20.9 s — ABNORMAL HIGH (ref 11.4–15.2)

## 2024-07-17 LAB — SEDIMENTATION RATE: Sed Rate: 16 mm/h (ref 0–16)

## 2024-07-17 LAB — APTT: aPTT: 49 s — ABNORMAL HIGH (ref 24–36)

## 2024-07-17 SURGERY — ANTERIOR CERVICAL DECOMPRESSION/DISCECTOMY FUSION 1 LEVEL
Anesthesia: General | Site: Neck

## 2024-07-17 MED ORDER — ORAL CARE MOUTH RINSE
15.0000 mL | OROMUCOSAL | Status: DC | PRN
  Administered 2024-07-20 – 2024-07-27 (×7): 15 mL via OROMUCOSAL

## 2024-07-17 MED ORDER — FENTANYL CITRATE (PF) 250 MCG/5ML IJ SOLN
INTRAMUSCULAR | Status: AC
  Filled 2024-07-17: qty 5

## 2024-07-17 MED ORDER — POTASSIUM CHLORIDE 20 MEQ PO PACK
20.0000 meq | PACK | Freq: Once | ORAL | Status: AC
  Administered 2024-07-17: 20 meq
  Filled 2024-07-17: qty 1

## 2024-07-17 MED ORDER — POLYETHYLENE GLYCOL 3350 17 G PO PACK
17.0000 g | PACK | Freq: Every day | ORAL | Status: DC | PRN
  Administered 2024-07-21: 17 g via ORAL

## 2024-07-17 MED ORDER — 0.9 % SODIUM CHLORIDE (POUR BTL) OPTIME
TOPICAL | Status: DC | PRN
  Administered 2024-07-17 (×2): 500 mL

## 2024-07-17 MED ORDER — CALCIUM CHLORIDE 10 % IV SOLN
INTRAVENOUS | Status: AC | PRN
  Administered 2024-07-17: 2 g via INTRAVENOUS

## 2024-07-17 MED ORDER — MIDAZOLAM-SODIUM CHLORIDE 100-0.9 MG/100ML-% IV SOLN
0.5000 mg/h | INTRAVENOUS | Status: DC
  Administered 2024-07-17: 2 mg/h via INTRAVENOUS
  Filled 2024-07-17: qty 100

## 2024-07-17 MED ORDER — PROPOFOL 10 MG/ML IV BOLUS
INTRAVENOUS | Status: AC
Start: 2024-07-17 — End: 2024-07-17
  Filled 2024-07-17: qty 20

## 2024-07-17 MED ORDER — FENTANYL BOLUS VIA INFUSION
25.0000 ug | INTRAVENOUS | Status: DC | PRN
Start: 2024-07-17 — End: 2024-07-20
  Administered 2024-07-19 (×3): 25 ug via INTRAVENOUS

## 2024-07-17 MED ORDER — DROPERIDOL 2.5 MG/ML IJ SOLN: 0.6250 mg | Freq: Once | INTRAMUSCULAR | Status: DC | PRN

## 2024-07-17 MED ORDER — NOREPINEPHRINE 4 MG/250ML-% IV SOLN
INTRAVENOUS | Status: AC
  Filled 2024-07-17: qty 250

## 2024-07-17 MED ORDER — GADOBUTROL 1 MMOL/ML IV SOLN
8.0000 mL | Freq: Once | INTRAVENOUS | Status: AC | PRN
  Administered 2024-07-17: 8 mL via INTRAVENOUS

## 2024-07-17 MED ORDER — THROMBIN 5000 UNITS EX KIT
PACK | CUTANEOUS | Status: AC
  Filled 2024-07-17: qty 1

## 2024-07-17 MED ORDER — VASOPRESSIN 20 UNITS/100 ML INFUSION FOR SHOCK
0.0000 [IU]/min | INTRAVENOUS | Status: DC
  Administered 2024-07-17: 0.04 [IU]/min via INTRAVENOUS
  Administered 2024-07-18: 0.03 [IU]/min via INTRAVENOUS
  Administered 2024-07-18: 0.04 [IU]/min via INTRAVENOUS
  Filled 2024-07-17 (×4): qty 100

## 2024-07-17 MED ORDER — THROMBIN 5000 UNITS EX SOLR: OROMUCOSAL | Status: DC | PRN

## 2024-07-17 MED ORDER — THROMBI-PAD 3"X3" EX PADS
1.0000 | MEDICATED_PAD | Freq: Once | CUTANEOUS | Status: DC
Start: 2024-07-17 — End: 2024-07-17
  Filled 2024-07-17: qty 1

## 2024-07-17 MED ORDER — SUCCINYLCHOLINE CHLORIDE 200 MG/10ML IV SOSY
PREFILLED_SYRINGE | INTRAVENOUS | Status: DC | PRN
  Administered 2024-07-17: 100 mg via INTRAVENOUS

## 2024-07-17 MED ORDER — INSULIN ASPART 100 UNIT/ML IJ SOLN: 0.0000 [IU] | INTRAMUSCULAR | Status: DC | PRN

## 2024-07-17 MED ORDER — PHENYLEPHRINE 80 MCG/ML (10ML) SYRINGE FOR IV PUSH (FOR BLOOD PRESSURE SUPPORT)
PREFILLED_SYRINGE | INTRAVENOUS | Status: DC | PRN
  Administered 2024-07-17: 240 ug via INTRAVENOUS
  Administered 2024-07-17: 160 ug via INTRAVENOUS

## 2024-07-17 MED ORDER — EPINEPHRINE HCL 5 MG/250ML IV SOLN IN NS
0.5000 ug/min | INTRAVENOUS | Status: DC
  Administered 2024-07-18: 5 ug/min via INTRAVENOUS
  Administered 2024-07-19: 3 ug/min via INTRAVENOUS
  Filled 2024-07-17 (×2): qty 250

## 2024-07-17 MED ORDER — FENTANYL 2500MCG IN NS 250ML (10MCG/ML) PREMIX INFUSION
0.0000 ug/h | INTRAVENOUS | Status: DC
  Administered 2024-07-17: 25 ug/h via INTRAVENOUS
  Filled 2024-07-17: qty 250

## 2024-07-17 MED ORDER — MIDAZOLAM HCL (PF) 2 MG/2ML IJ SOLN: 2.0000 mg | Freq: Once | INTRAMUSCULAR | Status: AC

## 2024-07-17 MED ORDER — ORAL CARE MOUTH RINSE: 15.0000 mL | Freq: Once | OROMUCOSAL | Status: AC

## 2024-07-17 MED ORDER — VASOPRESSIN 20 UNITS/100 ML INFUSION FOR SHOCK
INTRAVENOUS | Status: DC | PRN
  Administered 2024-07-17: .04 [IU]/min via INTRAVENOUS

## 2024-07-17 MED ORDER — LACTATED RINGERS IV SOLN: INTRAVENOUS | Status: DC | PRN

## 2024-07-17 MED ORDER — VANCOMYCIN HCL IN DEXTROSE 1-5 GM/200ML-% IV SOLN
1000.0000 mg | Freq: Once | INTRAVENOUS | Status: AC
  Administered 2024-07-17: 1000 mg via INTRAVENOUS

## 2024-07-17 MED ORDER — ALBUTEROL SULFATE (2.5 MG/3ML) 0.083% IN NEBU
2.5000 mg | INHALATION_SOLUTION | RESPIRATORY_TRACT | Status: DC | PRN
  Administered 2024-07-19 – 2024-07-22 (×4): 2.5 mg via RESPIRATORY_TRACT
  Filled 2024-07-17 (×4): qty 3

## 2024-07-17 MED ORDER — MIDAZOLAM HCL 2 MG/2ML IJ SOLN
INTRAMUSCULAR | Status: AC
  Administered 2024-07-17: 2 mg via INTRAVENOUS
  Filled 2024-07-17: qty 2

## 2024-07-17 MED ORDER — CALCIUM CHLORIDE 10 % IV SOLN
1.0000 g | Freq: Once | INTRAVENOUS | Status: DC
Start: 2024-07-17 — End: 2024-07-19

## 2024-07-17 MED ORDER — HEPARIN (PORCINE) 25000 UT/250ML-% IV SOLN
1300.0000 [IU]/h | INTRAVENOUS | Status: DC
Start: 2024-07-17 — End: 2024-07-18
  Administered 2024-07-17: 1300 [IU]/h via INTRAVENOUS

## 2024-07-17 MED ORDER — PHENYLEPHRINE 80 MCG/ML (10ML) SYRINGE FOR IV PUSH (FOR BLOOD PRESSURE SUPPORT)
PREFILLED_SYRINGE | INTRAVENOUS | Status: AC
  Filled 2024-07-17: qty 10

## 2024-07-17 MED ORDER — SODIUM BICARBONATE 8.4 % IV SOLN
INTRAVENOUS | Status: AC | PRN
  Administered 2024-07-17: 50 meq via INTRAVENOUS

## 2024-07-17 MED ORDER — MIDAZOLAM HCL 5 MG/5ML IJ SOLN
INTRAMUSCULAR | Status: DC | PRN
  Administered 2024-07-17: 2 mg via INTRAVENOUS

## 2024-07-17 MED ORDER — MIDAZOLAM HCL 2 MG/2ML IJ SOLN
INTRAMUSCULAR | Status: AC
  Filled 2024-07-17: qty 2

## 2024-07-17 MED ORDER — CHLORHEXIDINE GLUCONATE 0.12 % MT SOLN
15.0000 mL | Freq: Once | OROMUCOSAL | Status: AC
  Administered 2024-07-17: 15 mL via OROMUCOSAL

## 2024-07-17 MED ORDER — ACETAMINOPHEN 500 MG PO TABS
1000.0000 mg | ORAL_TABLET | Freq: Four times a day (QID) | ORAL | Status: DC | PRN
  Administered 2024-07-20 – 2024-07-25 (×6): 1000 mg via ORAL
  Filled 2024-07-17 (×6): qty 2

## 2024-07-17 MED ORDER — EPINEPHRINE HCL 5 MG/250ML IV SOLN IN NS
INTRAVENOUS | Status: DC | PRN
  Administered 2024-07-17: 20 ug/min via INTRAVENOUS

## 2024-07-17 MED ORDER — NOREPINEPHRINE 4 MG/250ML-% IV SOLN
0.0000 ug/min | INTRAVENOUS | Status: DC
  Administered 2024-07-17 (×2): 10 ug/min via INTRAVENOUS
  Administered 2024-07-18: 12 ug/min via INTRAVENOUS
  Filled 2024-07-17 (×2): qty 250

## 2024-07-17 MED ORDER — INSULIN ASPART 100 UNIT/ML IJ SOLN
0.0000 [IU] | INTRAMUSCULAR | Status: DC
  Administered 2024-07-17: 6 [IU] via SUBCUTANEOUS
  Administered 2024-07-17: 4 [IU] via SUBCUTANEOUS
  Administered 2024-07-17: 1 [IU] via SUBCUTANEOUS

## 2024-07-17 MED ORDER — LIDOCAINE-EPINEPHRINE 1 %-1:100000 IJ SOLN
INTRAMUSCULAR | Status: DC | PRN
  Administered 2024-07-17: 4 mL via INTRADERMAL

## 2024-07-17 MED ORDER — CHLORHEXIDINE GLUCONATE 0.12 % MT SOLN
OROMUCOSAL | Status: AC
  Filled 2024-07-17: qty 15

## 2024-07-17 MED ORDER — SODIUM BICARBONATE 8.4 % IV SOLN
INTRAVENOUS | Status: DC | PRN
  Administered 2024-07-17 (×2): 50 meq via INTRAVENOUS

## 2024-07-17 MED ORDER — PHENYLEPHRINE HCL-NACL 20-0.9 MG/250ML-% IV SOLN
INTRAVENOUS | Status: AC
  Filled 2024-07-17: qty 250

## 2024-07-17 MED ORDER — FENTANYL CITRATE (PF) 50 MCG/ML IJ SOSY
25.0000 ug | PREFILLED_SYRINGE | Freq: Once | INTRAMUSCULAR | Status: DC
Start: 2024-07-17 — End: 2024-07-18

## 2024-07-17 MED ORDER — PROPOFOL 10 MG/ML IV BOLUS
INTRAVENOUS | Status: DC | PRN
  Administered 2024-07-17: 120 mg via INTRAVENOUS

## 2024-07-17 MED ORDER — ONDANSETRON HCL 4 MG/2ML IJ SOLN
INTRAMUSCULAR | Status: DC | PRN
  Administered 2024-07-17: 4 mg via INTRAVENOUS

## 2024-07-17 MED ORDER — EPINEPHRINE 1 MG/10ML IV SOSY
PREFILLED_SYRINGE | INTRAVENOUS | Status: DC | PRN
  Administered 2024-07-17: .2 mg via INTRAVENOUS
  Administered 2024-07-17: 1 mg via INTRAVENOUS
  Administered 2024-07-17: .2 mg via INTRAVENOUS
  Administered 2024-07-17: .3 mg via INTRAVENOUS
  Administered 2024-07-17 (×2): .2 mg via INTRAVENOUS

## 2024-07-17 MED ORDER — ONDANSETRON HCL 4 MG/2ML IJ SOLN: 4.0000 mg | Freq: Four times a day (QID) | INTRAMUSCULAR | Status: DC | PRN

## 2024-07-17 MED ORDER — IOHEXOL 350 MG/ML SOLN
75.0000 mL | Freq: Once | INTRAVENOUS | Status: AC | PRN
  Administered 2024-07-17: 75 mL via INTRAVENOUS

## 2024-07-17 MED ORDER — OXIDIZED CELLULOSE EX PADS
1.0000 | MEDICATED_PAD | Freq: Once | CUTANEOUS | Status: AC
  Administered 2024-07-17: 1 via TOPICAL
  Filled 2024-07-17: qty 1

## 2024-07-17 MED ORDER — CHLORHEXIDINE GLUCONATE CLOTH 2 % EX PADS
6.0000 | MEDICATED_PAD | Freq: Every day | CUTANEOUS | Status: DC
  Administered 2024-07-17 – 2024-07-29 (×13): 6 via TOPICAL

## 2024-07-17 MED ORDER — LIDOCAINE 2% (20 MG/ML) 5 ML SYRINGE
INTRAMUSCULAR | Status: DC | PRN
  Administered 2024-07-17: 100 mg via INTRAVENOUS

## 2024-07-17 MED ORDER — VANCOMYCIN HCL IN DEXTROSE 1-5 GM/200ML-% IV SOLN
INTRAVENOUS | Status: AC
  Filled 2024-07-17: qty 200

## 2024-07-17 MED ORDER — PHENYLEPHRINE HCL-NACL 20-0.9 MG/250ML-% IV SOLN
INTRAVENOUS | Status: DC | PRN
Start: 2024-07-17 — End: 2024-07-17
  Administered 2024-07-17: 60 ug/min via INTRAVENOUS

## 2024-07-17 MED ORDER — MIDAZOLAM BOLUS VIA INFUSION: 2.0000 mg | INTRAVENOUS | Status: DC | PRN

## 2024-07-17 MED ORDER — SODIUM BICARBONATE 8.4 % IV SOLN
100.0000 meq | Freq: Once | INTRAVENOUS | Status: AC
  Administered 2024-07-17: 100 meq via INTRAVENOUS

## 2024-07-17 MED ORDER — INSULIN GLARGINE-YFGN 100 UNIT/ML ~~LOC~~ SOLN: 28.0000 [IU] | Freq: Every day | SUBCUTANEOUS | Status: DC

## 2024-07-17 MED ORDER — INSULIN GLARGINE-YFGN 100 UNIT/ML ~~LOC~~ SOLN
20.0000 [IU] | Freq: Two times a day (BID) | SUBCUTANEOUS | Status: DC
  Administered 2024-07-17: 20 [IU] via SUBCUTANEOUS
  Filled 2024-07-17 (×2): qty 0.2

## 2024-07-17 MED ORDER — SODIUM CHLORIDE 0.9% FLUSH
3.0000 mL | Freq: Two times a day (BID) | INTRAVENOUS | Status: DC
  Administered 2024-07-17 – 2024-07-20 (×8): 3 mL via INTRAVENOUS
  Administered 2024-07-21: 10 mL via INTRAVENOUS
  Administered 2024-07-21: 20 mL via INTRAVENOUS
  Administered 2024-07-21 – 2024-08-02 (×21): 3 mL via INTRAVENOUS

## 2024-07-17 MED ORDER — SODIUM CHLORIDE 0.9 % IV SOLN: INTRAVENOUS | Status: AC

## 2024-07-17 MED ORDER — FENTANYL CITRATE (PF) 250 MCG/5ML IJ SOLN
INTRAMUSCULAR | Status: DC | PRN
  Administered 2024-07-17: 50 ug via INTRAVENOUS
  Administered 2024-07-17: 100 ug via INTRAVENOUS

## 2024-07-17 MED ORDER — ROCURONIUM BROMIDE 10 MG/ML (PF) SYRINGE
PREFILLED_SYRINGE | INTRAVENOUS | Status: DC | PRN
  Administered 2024-07-17: 70 mg via INTRAVENOUS

## 2024-07-17 MED ORDER — VASOPRESSIN 20 UNIT/ML IV SOLN
INTRAVENOUS | Status: DC | PRN
  Administered 2024-07-17: 2 [IU] via INTRAVENOUS
  Administered 2024-07-17: 3 [IU] via INTRAVENOUS
  Administered 2024-07-17: 2 [IU] via INTRAVENOUS
  Administered 2024-07-17: 4 [IU] via INTRAVENOUS
  Administered 2024-07-17 (×2): 2 [IU] via INTRAVENOUS
  Administered 2024-07-17: 4 [IU] via INTRAVENOUS
  Administered 2024-07-17 (×4): 2 [IU] via INTRAVENOUS

## 2024-07-17 MED ORDER — MIDAZOLAM HCL 2 MG/2ML IJ SOLN
INTRAMUSCULAR | Status: AC
  Filled 2024-07-17: qty 4

## 2024-07-17 MED ORDER — DEXMEDETOMIDINE HCL IN NACL 400 MCG/100ML IV SOLN
0.0000 ug/kg/h | INTRAVENOUS | Status: DC
  Administered 2024-07-17: 0.4 ug/kg/h via INTRAVENOUS
  Administered 2024-07-18: 0.5 ug/kg/h via INTRAVENOUS
  Filled 2024-07-17 (×2): qty 100

## 2024-07-17 MED ORDER — LACTATED RINGERS IV SOLN: INTRAVENOUS | Status: DC

## 2024-07-17 MED ORDER — LIDOCAINE-EPINEPHRINE 1 %-1:100000 IJ SOLN
INTRAMUSCULAR | Status: AC
Start: 2024-07-17 — End: 2024-07-17
  Filled 2024-07-17: qty 1

## 2024-07-17 MED ORDER — HYDROMORPHONE HCL 1 MG/ML IJ SOLN: 0.2500 mg | INTRAMUSCULAR | Status: DC | PRN

## 2024-07-17 MED ORDER — MIDAZOLAM HCL (PF) 2 MG/2ML IJ SOLN: 4.0000 mg | Freq: Once | INTRAMUSCULAR | Status: DC

## 2024-07-17 MED ORDER — EPHEDRINE SULFATE-NACL 50-0.9 MG/10ML-% IV SOSY
PREFILLED_SYRINGE | INTRAVENOUS | Status: DC | PRN
  Administered 2024-07-17: 10 mg via INTRAVENOUS

## 2024-07-17 MED ORDER — PHENYLEPHRINE HCL-NACL 20-0.9 MG/250ML-% IV SOLN: 0.0000 ug/min | INTRAVENOUS | Status: DC

## 2024-07-17 MED ORDER — VASOPRESSIN 20 UNIT/ML IV SOLN
INTRAVENOUS | Status: AC
  Filled 2024-07-17: qty 1

## 2024-07-17 MED ORDER — MELATONIN 3 MG PO TABS
6.0000 mg | ORAL_TABLET | Freq: Every evening | ORAL | Status: DC | PRN
  Administered 2024-07-28: 6 mg via ORAL
  Filled 2024-07-17: qty 2

## 2024-07-17 MED ORDER — ORAL CARE MOUTH RINSE
15.0000 mL | OROMUCOSAL | Status: DC
  Administered 2024-07-17 – 2024-07-19 (×20): 15 mL via OROMUCOSAL

## 2024-07-17 MED ORDER — DEXAMETHASONE SOD PHOSPHATE PF 10 MG/ML IJ SOLN
INTRAMUSCULAR | Status: DC | PRN
  Administered 2024-07-17: 10 mg via INTRAVENOUS

## 2024-07-17 SURGICAL SUPPLY — 56 items
BAG COUNTER SPONGE SURGICOUNT (BAG) ×2 IMPLANT
BAND RUBBER #18 3X1/16 STRL (MISCELLANEOUS) ×4 IMPLANT
BASKET BONE COLLECTION (BASKET) IMPLANT
BENZOIN TINCTURE PRP APPL 2/3 (GAUZE/BANDAGES/DRESSINGS) ×2 IMPLANT
BIT DRILL NEURO 2X3.1 SFT TUCH (MISCELLANEOUS) ×2 IMPLANT
BLADE CLIPPER SURG (BLADE) IMPLANT
BUR 14 MATCH 3 (BUR) IMPLANT
BUR MATCHSTICK NEURO 3.0 LAGG (BURR) ×2 IMPLANT
BUR MR8 14 BALL 5 (BUR) IMPLANT
CANISTER SUCTION 3000ML PPV (SUCTIONS) ×2 IMPLANT
DRAPE C-ARM 42X72 X-RAY (DRAPES) ×4 IMPLANT
DRAPE LAPAROTOMY 100X72 PEDS (DRAPES) ×2 IMPLANT
DRAPE MICROSCOPE SLANT 54X150 (MISCELLANEOUS) ×2 IMPLANT
DRAPE SHEET LG 3/4 BI-LAMINATE (DRAPES) ×10 IMPLANT
DRESSING MEPILEX FLEX 4X4 (GAUZE/BANDAGES/DRESSINGS) IMPLANT
DRSG OPSITE 4X5.5 SM (GAUZE/BANDAGES/DRESSINGS) ×4 IMPLANT
DRSG OPSITE POSTOP 3X4 (GAUZE/BANDAGES/DRESSINGS) ×2 IMPLANT
DRSG OPSITE POSTOP 4X6 (GAUZE/BANDAGES/DRESSINGS) IMPLANT
DURAPREP 26ML APPLICATOR (WOUND CARE) ×2 IMPLANT
ELECT COATED BLADE 2.86 ST (ELECTRODE) ×2 IMPLANT
ELECTRODE REM PT RTRN 9FT ADLT (ELECTROSURGICAL) ×2 IMPLANT
FEE COVERAGE SUPPORT O-ARM (MISCELLANEOUS) ×2 IMPLANT
GAUZE 4X4 16PLY ~~LOC~~+RFID DBL (SPONGE) IMPLANT
GLOVE BIO SURGEON STRL SZ7 (GLOVE) ×8 IMPLANT
GLOVE BIOGEL PI IND STRL 7.5 (GLOVE) ×4 IMPLANT
GLOVE BIOGEL PI IND STRL 8 (GLOVE) ×8 IMPLANT
GLOVE ECLIPSE 8.0 STRL XLNG CF (GLOVE) ×2 IMPLANT
GOWN STRL REUS W/ TWL LRG LVL3 (GOWN DISPOSABLE) ×4 IMPLANT
GOWN STRL REUS W/ TWL XL LVL3 (GOWN DISPOSABLE) ×4 IMPLANT
GOWN STRL REUS W/TWL 2XL LVL3 (GOWN DISPOSABLE) IMPLANT
HEMOSTAT POWDER KIT SURGIFOAM (HEMOSTASIS) ×2 IMPLANT
KIT BASIN OR (CUSTOM PROCEDURE TRAY) ×2 IMPLANT
KIT TURNOVER KIT B (KITS) ×2 IMPLANT
MARKER SPHERE PSV REFLC NDI (MISCELLANEOUS) ×10 IMPLANT
NDL HYPO 22X1.5 SAFETY MO (MISCELLANEOUS) ×2 IMPLANT
NDL SPNL 22GX3.5 QUINCKE BK (NEEDLE) ×2 IMPLANT
NEEDLE HYPO 22X1.5 SAFETY MO (MISCELLANEOUS) ×2 IMPLANT
NEEDLE SPNL 22GX3.5 QUINCKE BK (NEEDLE) ×2 IMPLANT
PACK LAMINECTOMY NEURO (CUSTOM PROCEDURE TRAY) ×2 IMPLANT
PAD ARMBOARD POSITIONER FOAM (MISCELLANEOUS) ×6 IMPLANT
PATTIES SURGICAL .5 X3 (DISPOSABLE) ×2 IMPLANT
PIN DISTRACTION TAP 12 DISP (PIN) ×4 IMPLANT
PUTTY DBF 1CC CORTICAL FIBERS (Putty) IMPLANT
SOLN 0.9% NACL POUR BTL 1000ML (IV SOLUTION) ×2 IMPLANT
SOLN STERILE WATER BTL 1000 ML (IV SOLUTION) ×2 IMPLANT
SPIKE FLUID TRANSFER (MISCELLANEOUS) ×2 IMPLANT
SPONGE INTESTINAL PEANUT (DISPOSABLE) ×2 IMPLANT
STAPLER SKIN PROX 35W (STAPLE) IMPLANT
STRIP CLOSURE SKIN 1/2X4 (GAUZE/BANDAGES/DRESSINGS) ×2 IMPLANT
SUT MNCRL AB 4-0 PS2 18 (SUTURE) ×2 IMPLANT
SUT SILK 2 0 TIES 10X30 (SUTURE) IMPLANT
SUT VIC AB 3-0 SH 8-18 (SUTURE) ×2 IMPLANT
TAPE CLOTH 3X10 TAN LF (GAUZE/BANDAGES/DRESSINGS) ×2 IMPLANT
TIP KERRISON THIN FOOTPLATE 2M (MISCELLANEOUS) ×2 IMPLANT
TOWEL GREEN STERILE (TOWEL DISPOSABLE) ×2 IMPLANT
TOWEL GREEN STERILE FF (TOWEL DISPOSABLE) ×2 IMPLANT

## 2024-07-17 NOTE — Progress Notes (Addendum)
 Full H+P to follow   In brief, Patient with a fall and now acute paraplegia, with CT reviewed by neurology concerning for cord compression and MRI demonstrating acute traumatic findings C7-T1 including disruption of the ligamentum flavum with interspinous and posterior paraspinal ligamentous strain, traumatic, cord contusion, and 2-3 mm thick epidural canal hemorrhage. EDP has consulted with NSGY and requested for an in person consultation tonight which is appropriate. NSGY requesting for medical admission. [facility] will admit however at this time I would like patient seen by NSGY in person prior to admit order placed, to remain in ER until he is assessed. I will see and place orders otherwise for his other medical problems.     Dorn Dawson, MD  [facility]

## 2024-07-17 NOTE — Anesthesia Procedure Notes (Signed)
 Procedure Name: Intubation Date/Time: 07/17/2024 12:26 PM  Performed by: Lamar Lucie DASEN, CRNAPre-anesthesia Checklist: Patient identified, Emergency Drugs available, Suction available and Patient being monitored Patient Re-evaluated:Patient Re-evaluated prior to induction Oxygen Delivery Method: Circle system utilized Preoxygenation: Pre-oxygenation with 100% oxygen Induction Type: IV induction, Rapid sequence and Cricoid Pressure applied Laryngoscope Size: Glidescope and 4 Grade View: Grade I Tube type: Oral Tube size: 7.5 mm Number of attempts: 1 Airway Equipment and Method: Stylet Placement Confirmation: ETT inserted through vocal cords under direct vision, positive ETCO2 and breath sounds checked- equal and bilateral Secured at: 23 cm Tube secured with: Tape Dental Injury: Teeth and Oropharynx as per pre-operative assessment  Difficulty Due To: Difficult Airway- due to cervical collar Comments: Head and neck maintained in neutral spine for intubation.

## 2024-07-17 NOTE — Consult Note (Signed)
 NAME:  Lawrence [name], MRN:  [id number], DOB:  Mar 30, 1946, LOS: 0 ADMISSION DATE:  07/16/2024, CONSULTATION DATE: 07/17/2024 REFERRING MD:  Debby Carrier , CHIEF COMPLAINT: Status postcardiac arrest  History of Present Illness:  78 year old male with diabetes, hypertension, BPH status post TURP who presented status post ground-level fall which happened today.  Per patient's family he has been walking with a cane because he is feeling numbness and tingling in his lower extremity.  After he fell today he hit his head, he lost complete sensation in bilateral legs and developed urinary incontinence.  Neurosurgery was consulted, patient mental OR for partial anterior cervical discectomy C7-T1 but course was complicated with multiple time PEA cardiac arrest in OR, procedure was aborted, TEE was done which showed severe RV dysfunction and dilatation with moderate to severe LV dysfunction, patient was transferred to ICU for close monitoring.  In ICU patient blood pressure started dropping and he went into PEA cardiac arrest again, ROSC was achieved after 2 minutes.  Please see separate cardiac arrest note  Pertinent  Medical History   Past Medical History:  Diagnosis Date   Arthritis    Diabetes mellitus without complication (HCC)    GERD (gastroesophageal reflux disease)    Hypertension    Sleep apnea      Significant Hospital Events: Including procedures, antibiotic start and stop dates in addition to other pertinent events   Admitted and PCCM consulted, coded multiple times in OR (PEA)  Interim History / Subjective:  As above  Objective    Blood pressure 110/62, pulse (!) 116, temperature 98.4 F (36.9 C), temperature source Oral, resp. rate 16, height 6' 1 (1.854 m), weight 79.4 kg, SpO2 100%.    Vent Mode: PRVC FiO2 (%):  [100 %] 100 % Set Rate:  [16 bmp] 16 bmp Vt Set:  [359359 mL] 640640 mL PEEP:  [5 cmH20] 5 cmH20 Plateau Pressure:  [20 cmH20] 20 cmH20   Intake/Output  Summary (Last 24 hours) at 07/17/2024 1549 Last data filed at 07/17/2024 1415 Gross per 24 hour  Intake 1730 ml  Output 850 ml  Net 880 ml   Filed Weights   07/16/24 1816 07/16/24 2239  Weight: 79.4 kg 79.4 kg    Examination: General: Crtitically ill-appearing male, orally intubated HEENT: Okay/AT, eyes anicteric.  ETT and OGT in place. Hard neck collar in place Neuro: Sedated, not following commands.  Eyes are closed.  Pupils 3 mm bilateral reactive to light Chest: Coarse breath sounds, no wheezes or rhonchi Heart: Regular rate and rhythm, no murmurs or gallops Abdomen: Soft, nondistended, bowel sounds present  Labs and images reviewed  Patient Lines/Drains/Airways Status     Active Line/Drains/Airways     Name Placement date Placement time Site Days   Peripheral IV 07/16/24 20 G 1 Left Antecubital 07/16/24  1956  Antecubital  1   Peripheral IV 07/17/24 18 G Right Forearm 07/17/24  1227  Forearm  less than 1   CVC Double Lumen 07/17/24 Right Internal jugular 07/17/24  --  -- less than 1   Urethral Catheter Jamie Aydelette RN Latex 16 Fr. 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 Mixed metabolic and respiratory acidosis Acute kidney injury due to ischemic ATN Hypokalemia  Continue telemetry monitoring Per discussion with neurosurgeon, they recommend no systemic tPA Started on IV heparin  infusion Once patient is stabilized, we will send him for CT angiogram of chest, PE protocol Patient can get mechanical thrombectomy versus catheter  directed thrombolysis Continue protective ventilation VAP prevention bundle in place Vent setting was adjusted to clear hypercapnia Bed protocol with Precedex and fentanyl  with RASS goal -2 Bedside echocardiogram showing poor biventricular function, RV is dilated with McConnell sign, probably related to acute PE Continue IV heparin  infusion Continue insulin  with CBG goal 140-180 Trend lactate and ABGs Patient received 3 AMPS of bicarbonate Monitor intake and output Avoid nephrotoxic agent Continue aggressive electrolyte replacement   Labs   CBC: Recent Labs  Lab 07/16/24 1959 07/17/24 0425 07/17/24 1451 07/17/24 1453 07/17/24 1512  WBC 12.0* 9.7 12.0*  --   --   NEUTROABS 10.3*  --   --   --   --   HGB 14.7 12.4* 10.5* 9.5* 9.2*  HCT 40.6 35.6* 31.5* 28.0* 27.0*  MCV 89.6 91.5 95.7  --   --   PLT 253 208 86*  --   --     Basic Metabolic Panel: Recent Labs  Lab 07/16/24 1959 07/17/24 0425 07/17/24 1451 07/17/24 1453 07/17/24 1512  NA 132* 135 137 137 139  K 3.7 3.6 3.4* 3.3* 4.2  CL 91* 98 101  --   --   CO2 30 24 18*  --   --   GLUCOSE 361* 180* 286*  --   --   BUN 24* 22 25*  --   --   CREATININE 1.08 0.99 1.50*  --   --   CALCIUM 10.2 9.0 7.8*  --   --   MG  --  2.2  --   --   --   PHOS  --  3.4  --   --   --    GFR: Estimated Creatinine Clearance: 47.1 mL/min (A) (by C-G formula based on SCr of 1.5 mg/dL (H)). Recent Labs  Lab 07/16/24 1959 07/17/24 0425 07/17/24 1451 07/17/24 1459  WBC 12.0* 9.7 12.0*  --   LATICACIDVEN  --   --   --  11.6*    Liver Function Tests: Recent Labs  Lab 07/16/24 1959  AST 46*  ALT 20  ALKPHOS 103  BILITOT 0.7  PROT 7.3  ALBUMIN 4.3   No results for input(s): LIPASE, AMYLASE in the last 168 hours. No results for input(s): AMMONIA in the last 168 hours.  ABG    Component Value Date/Time   PHART 7.305 (L) 07/17/2024 1512   PCO2ART 50.4 (H) 07/17/2024 1512   PO2ART 314 (H) 07/17/2024 1512   HCO3 25.9  07/17/2024 1512   TCO2 28 07/17/2024 1512   ACIDBASEDEF 1.0 07/17/2024 1512   O2SAT 100 07/17/2024 1512     Coagulation Profile: Recent Labs  Lab 07/17/24 0425 07/17/24 1451  INR 1.0 1.7*    Cardiac Enzymes: Recent Labs  Lab 07/16/24 1959 07/17/24 0425  CKTOTAL 1,321* 938*    HbA1C: Hemoglobin A1C  Date/Time Value Ref Range Status  07/23/2021 12:00 AM 11.9  Final  10/27/2020 12:00 AM 9.3  Final   HbA1c, POC (controlled diabetic range)  Date/Time Value Ref  Range Status  11/19/2021 11:44 AM 10.2 (A) 0.0 - 7.0 % Final   HbA1c POC (<> result, manual entry)  Date/Time Value Ref Range Status  03/18/2022 03:21 PM 10.8 4.0 - 5.6 % Final   Hgb A1c MFr Bld  Date/Time Value Ref Range Status  07/17/2024 04:25 AM 12.3 (H) 4.8 - 5.6 % Final    Comment:    (NOTE) Diagnosis of Diabetes The following HbA1c ranges recommended by the American Diabetes Association (ADA) may be used as an aid in the diagnosis of diabetes mellitus.  Hemoglobin             Suggested A1C NGSP%              Diagnosis  <5.7                   Non Diabetic  5.7-6.4                Pre-Diabetic  >6.4                   Diabetic  <7.0                   Glycemic control for                       adults with diabetes.    12/02/2022 11:30 AM 11.9 (H) 4.8 - 5.6 % Final    Comment:    (NOTE)         Prediabetes: 5.7 - 6.4         Diabetes: >6.4         Glycemic control for adults with diabetes: <7.0     CBG: Recent Labs  Lab 07/16/24 1832 07/17/24 0423 07/17/24 0801 07/17/24 1133 07/17/24 1453  GLUCAP 422* 178* 176* 152* 285*    Review of Systems:   Unable to obtain as patient is intubated and sedated  Past Medical History:  He,  has a past medical history of Arthritis, Diabetes mellitus without complication (HCC), GERD (gastroesophageal reflux disease), Hypertension, and Sleep apnea.   Surgical History:   Past Surgical History:  Procedure Laterality Date   APPENDECTOMY     78 yo    BACK SURGERY  1975   lumbar diskectomy   done [facility]   COLONOSCOPY N/A 02/21/2015   Procedure: COLONOSCOPY;  Surgeon: Claudis RAYMOND Rivet, MD;  Location: [facility];  Service: Endoscopy;  Laterality: N/A;  1025   COLONOSCOPY  11/13/2021   COLONOSCOPY WITH PROPOFOL  N/A 11/13/2021   Procedure: COLONOSCOPY WITH PROPOFOL ;  Surgeon: Rivet Claudis RAYMOND, MD;  Location: [facility];  Service: Endoscopy;  Laterality: N/A;  155   CYSTOSCOPY WITH URETHRAL DILATATION N/A 12/09/2022   Procedure: CYSTOSCOPY WITH URETHRAL DILATATION;  Surgeon: Watt Rush, MD;  Location: [facility];  Service: Urology;  Laterality: N/A;   EYE SURGERY Bilateral    Lasik surgery   LEFT HEART CATH AND CORONARY ANGIOGRAPHY N/A 12/23/2020   Procedure: LEFT HEART CATH AND CORONARY ANGIOGRAPHY;  Surgeon: Burnard Debby LABOR, MD;  Location: [facility];  Service: Cardiovascular;  Laterality: N/A;   NECK SURGERY  1999   Work injury, posterior fusion   POLYPECTOMY  11/13/2021   Procedure: POLYPECTOMY INTESTINAL;  Surgeon: Rivet Claudis RAYMOND, MD;  Location: [facility];  Service: Endoscopy;;   SHOULDER SURGERY Bilateral 2000   rotoator cuff repair   done at [facility]   TONSILLECTOMY  TRANSURETHRAL INCISION OF PROSTATE N/A 12/09/2022   Procedure: TRANSURETHRAL RESECTION OF PROSTATE;  Surgeon: Watt Rush, MD;  Location: [facility];  Service: Urology;  Laterality: N/A;  1 HR FOR CASE     Social History:   reports that he has never smoked. He has never used smokeless tobacco. He reports that he does not drink alcohol and does not use drugs.   Family History:  His family history is not on file.   Allergies Allergies  Allergen Reactions   Penicillins Hives   Lucentis [Ranibizumab] Rash     Home Medications  Prior to Admission medications   Medication Sig Start Date End Date Taking? Authorizing Provider  Apple Cider Vinegar 600 MG CAPS Take 600 mg by mouth daily.   Yes [provider]  aspirin  EC 81 MG  tablet Take 1 tablet (81 mg total) by mouth every other day. 11/14/21  Yes Rehman, Claudis PENNER, MD  Barberry-Oreg Grape-Goldenseal (BERBERINE COMPLEX PO) Take 1 capsule by mouth daily.   Yes [provider]  cholecalciferol (VITAMIN D3) 25 MCG (1000 UNIT) tablet Take 1,000 Units by mouth daily.   Yes [provider]  Cinnamon 500 MG capsule Take 500 mg by mouth daily.   Yes [provider]  diphenhydrAMINE HCl (NERVINE PO) Take 1 tablet by mouth daily. Patient states that he takes one a day   Yes [provider]  empagliflozin (JARDIANCE) 10 MG TABS tablet Take 10 mg by mouth daily.   Yes [provider]  ferrous sulfate 325 (65 FE) MG tablet Take 325 mg by mouth daily with breakfast.   Yes [provider]  hydrochlorothiazide  (HYDRODIURIL ) 25 MG tablet Take 25 mg by mouth daily. 07/23/21  Yes [provider]  insulin  degludec (TRESIBA  FLEXTOUCH) 100 UNIT/ML FlexTouch Pen Inject 12 Units into the skin at bedtime. Patient taking differently: Inject 28 Units into the skin 2 (two) times daily. 10/07/22  Yes Reardon, Benton PARAS, NP  Multiple Vitamins-Minerals (OCUVITE ADULT 50+ PO) Take 1 tablet by mouth daily.   Yes [provider]  OVER THE COUNTER MEDICATION Take 1 capsule by mouth daily. Prostagenix   Yes [provider]  OVER THE COUNTER MEDICATION Take 1 capsule by mouth daily. Diabacore (Diabetic Supplement)   Yes [provider]  OVER THE COUNTER MEDICATION Take 1 tablet by mouth in the morning and at bedtime. Glucoceil   Yes [provider]  tadalafil  (CIALIS ) 5 MG tablet Take 1 tablet (5 mg total) by mouth daily as needed for erectile dysfunction. 12/06/20  Yes Watt Rush, MD  vitamin E 180 MG (400 UNITS) capsule Take 400 Units by mouth daily.   Yes [provider]  Vitamin Mixture (ESTER-C PO) Take 1,000 mg by mouth daily.   Yes [provider]     Critical care time:      The  patient is critically ill due to status post PEA cardiac arrest/probable acute massive PE.  Critical care was necessary to treat or prevent imminent or life-threatening deterioration.  Critical care was time spent personally by me on the following activities: development of treatment plan with patient and/or surrogate as well as nursing, discussions with consultants, evaluation of patient's response to treatment, examination of patient, obtaining history from patient or surrogate, ordering and performing treatments and interventions, ordering and review of laboratory studies, ordering and review of radiographic studies, pulse oximetry, re-evaluation of patient's condition and participation in multidisciplinary rounds.   During this encounter critical care  time was devoted to patient care services described in this note for 55 minutes.     Valinda Novas, MD [facility] See Amion for pager If no response to pager, please call [phone] until 7pm After 7pm, Please call E-link [phone]

## 2024-07-17 NOTE — Transfer of Care (Addendum)
 Immediate Anesthesia Transfer of Care Note  Patient: Lawrence [name]  Procedure(s) Performed: ANTERIOR CERVICAL DECOMPRESSION/DISCECTOMY FUSION, CERVICAL SEVEN - THORACIC ONE (Neck)  Patient Location: ICU  Anesthesia Type:General  Level of Consciousness: drowsy and patient cooperative  Airway & Oxygen Therapy: Patient remains intubated per anesthesia plan and Patient placed on Ventilator (see vital sign flow sheet for setting)  Post-op Assessment: Report given to RN and Post -op Vital signs reviewed and stable  Post vital signs: Reviewed and stable  Last Vitals:  Vitals Value Taken Time  BP    Temp    Pulse 116 07/17/24 14:40  Resp 16 07/17/24 14:50  SpO2 100 % 07/17/24 14:40  Vitals shown include unfiled device data.  Last Pain:  Vitals:   07/17/24 1140  TempSrc: Oral  PainSc: 6       Patients Stated Pain Goal: 1 (07/17/24 1140)  Complications: No notable events documented.  Patient transported to ICU with standard monitors (HR, BP, SPO2, RR) and emergency drugs/equipment. Controlled ventilation maintained via ambu bag. Report given to bedside RN and respiratory therapist. Pt connected to ICU monitor and ventilator. All questions answered and vital signs stable before leaving. Transported to ICU with Dr. Darlyn and report given to [facility] doctor

## 2024-07-17 NOTE — Anesthesia Procedure Notes (Signed)
 Arterial Line Insertion Start/End10/26/2025 1:40 PM, 07/17/2024 1:47 PM Performed by: Darlyn Rush, MD  Patient location: Pre-op. Preanesthetic checklist: patient identified, IV checked, site marked, risks and benefits discussed, surgical consent, monitors and equipment checked, pre-op evaluation, timeout performed and anesthesia consent Lidocaine  1% used for infiltration Right, radial was placed Catheter size: 20 G Hand hygiene performed , maximum sterile barriers used  and Seldinger technique used  Attempts: 1 Procedure performed using ultrasound to evaluate access site. Ultrasound Notes:relevant anatomy identified, ultrasound used to visualize needle entry, vessel patent under ultrasound and image(s) printed for medical record. Following insertion, dressing applied and Biopatch. Post procedure assessment: normal and unchanged  Post procedure complications: unsuccessful attempts, second provider assisted and local hematoma. Patient tolerated the procedure well with no immediate complications.

## 2024-07-17 NOTE — ED Notes (Signed)
 Patient returned from MRI.

## 2024-07-17 NOTE — Progress Notes (Signed)
 This patient was seen by my colleague, Dr. Keturah early this morning. He then was evaluated by neurosurgery and taken to the OR for  Partial anterior cervical discectomy, C7-T1, which was aborted due to intra-operative cardiac arrest. The patient has been returned to the CV ICU intubated, sedated on on levophed and neo-synephrine. Appropriately critical care has been consulted for medical care at this time. [facility] hospitalists will step back now, but remain available to help when the patient comes out of the ICU.

## 2024-07-17 NOTE — Progress Notes (Signed)
 I had a long discussion with the patient's son, daughter and wife regarding the events in the operating room.  I explained that with his cardiac instability, his prognosis is poor but fortunately we have been able to maintain perfusing blood pressures.  All questions and concerns were answered.  They verbalized understanding.

## 2024-07-17 NOTE — Progress Notes (Addendum)
 PHARMACY - ANTICOAGULATION CONSULT NOTE  Pharmacy Consult for heparin  Indication: pulmonary embolus  Allergies  Allergen Reactions   Penicillins Hives   Lucentis [Ranibizumab] Rash    Patient Measurements: Height: 6' 1 (185.4 cm) Weight: 79.4 kg (175 lb) IBW/kg (Calculated) : 79.9 HEPARIN  DW (KG): 79.4  Vital Signs: Temp: 98.4 F (36.9 C) (10/26 1140) Temp Source: Oral (10/26 1140) BP: 110/62 (10/26 1000) Pulse Rate: 116 (10/26 1440)  Labs: Recent Labs    07/16/24 1959 07/17/24 0425  HGB 14.7 12.4*  HCT 40.6 35.6*  PLT 253 208  LABPROT  --  14.1  INR  --  1.0  CREATININE 1.08 0.99  CKTOTAL 1,321* 938*    Estimated Creatinine Clearance: 71.3 mL/min (by C-G formula based on SCr of 0.99 mg/dL).   Medical History: Past Medical History:  Diagnosis Date   Arthritis    Diabetes mellitus without complication (HCC)    GERD (gastroesophageal reflux disease)    Hypertension    Sleep apnea     Medications:  Medications Prior to Admission  Medication Sig Dispense Refill Last Dose/Taking   Apple Cider Vinegar 600 MG CAPS Take 600 mg by mouth daily.   07/16/2024 Morning   aspirin  EC 81 MG tablet Take 1 tablet (81 mg total) by mouth every other day. 30 tablet 11 07/16/2024 Morning   Barberry-Oreg Grape-Goldenseal (BERBERINE COMPLEX PO) Take 1 capsule by mouth daily.   07/16/2024 Morning   cholecalciferol (VITAMIN D3) 25 MCG (1000 UNIT) tablet Take 1,000 Units by mouth daily.   07/16/2024 Morning   Cinnamon 500 MG capsule Take 500 mg by mouth daily.   07/16/2024 Morning   diphenhydrAMINE HCl (NERVINE PO) Take 1 tablet by mouth daily. Patient states that he takes one a day   07/16/2024 Morning   empagliflozin (JARDIANCE) 10 MG TABS tablet Take 10 mg by mouth daily.   07/16/2024 Morning   ferrous sulfate 325 (65 FE) MG tablet Take 325 mg by mouth daily with breakfast.   07/16/2024 Morning   hydrochlorothiazide  (HYDRODIURIL ) 25 MG tablet Take 25 mg by mouth daily.    07/16/2024 Morning   insulin  degludec (TRESIBA  FLEXTOUCH) 100 UNIT/ML FlexTouch Pen Inject 12 Units into the skin at bedtime. (Patient taking differently: Inject 28 Units into the skin 2 (two) times daily.) 9 mL 3 07/16/2024 Noon   Multiple Vitamins-Minerals (OCUVITE ADULT 50+ PO) Take 1 tablet by mouth daily.   07/16/2024 Morning   OVER THE COUNTER MEDICATION Take 1 capsule by mouth daily. Prostagenix   07/16/2024 Morning   OVER THE COUNTER MEDICATION Take 1 capsule by mouth daily. Diabacore (Diabetic Supplement)   07/16/2024 Morning   OVER THE COUNTER MEDICATION Take 1 tablet by mouth in the morning and at bedtime. Glucoceil   07/16/2024 Morning   tadalafil  (CIALIS ) 5 MG tablet Take 1 tablet (5 mg total) by mouth daily as needed for erectile dysfunction. 30 tablet 11 Unknown   vitamin E 180 MG (400 UNITS) capsule Take 400 Units by mouth daily.   07/16/2024 Morning   Vitamin Mixture (ESTER-C PO) Take 1,000 mg by mouth daily.   07/16/2024 Morning   Scheduled:   insulin  aspart  0-6 Units Subcutaneous Q4H   [START ON 07/18/2024] insulin  glargine-yfgn  28 Units Subcutaneous Daily   phenylephrine       sodium chloride  flush  3 mL Intravenous Q12H    Assessment: Lawrence [name] is a 84 YOM admitted with concern for spinal cord injury. Patient taken to the OR for neurosurgery, procedure  complicated by multiple rounds of CPR. Patient transferred to CVICU for further management. Per MD, concern for pulmonary embolism on POCUS. Patient not on anticoagulation prior to hospitalization. Pharmacy consulted for heparin  dosing.    07/17/24: No labs since early this morning prior to the OR. Hgb 12.4, PLT 208. Patient did not receive heparin  in the OR. Per Dr. Harold, no bolus doses of heparin  with possible plans for further interventions.   Goal of Therapy:  Heparin  level 0.3-0.7 units/ml Monitor platelets by anticoagulation protocol: Yes   Plan:  Start heparin  1300 units/hr - no bolus per MD Monitor heparin  level in  8 hours Monitor heparin  level, CBC, and s/sx of bleeding daily  Morna Breach, PharmD PGY2 Cardiology Pharmacy Resident 07/17/2024 2:49 PM

## 2024-07-17 NOTE — ED Notes (Signed)
 Patient transported to MRI

## 2024-07-17 NOTE — Progress Notes (Signed)
 Patient ID: Lawrence [name], male   DOB: [birth year], 78 y.o.   MRN: [id number]   Called regarding acute High risk PE.  Reviewed the chart and CTA.  He has bilateral proximal hilar PE.  No Saddle component.  Rt heart strain confirmed on bedside echo.  He's had 2 PEA arrests today, one during aborted cspine fusion in the OR and a second arrest in the ICU.  He has improved now hemodynamically with some residual pressor support and IV heparin .  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  He's not a lytic candidate after surgery today.  Case d/w Drs Terrea and Claudene of PCCM regarding attempt at [facility] suction thrombectomy.  After our discussions and Dr Theressa discussion with the family, they wish to hold off on further aggressive intervention at this time, continue ICU care and IV heparin , and considering DNR for any further events.  PCCM to reassess pt and treatment options in the morning, and will let our IR team know if they wish to discuss/proceed with any attempt at thrombectomy tomorrow.

## 2024-07-17 NOTE — Progress Notes (Signed)
 07/17/2024 Discussed options at length with family regarding going for mechanical thrombectomy vs. waiting with heparin .  Systemic lytics not an option with size and location of surgical bed.  He has arrested on and off for over an hour and I worry that any further perturbations with suction thrombectomy attempt and associated transport will cause another arrest without salvage available (not ECMO candidate).  Should he arrest during mechanical thrombectomy we have added issues of large cannula site and additional hemorrhage during compressions.  On flip side, very real possibility that recurrent arrest will occur if we do nothing but heparin  should RV death spiral recur.  Given clinical equipoise, improving lactate, improving pressor needs we all agree that watchful waiting this evening is way to go, also allows family to be with patient in case of recurrent arrest rather than it occurring down in IR.  I did share my concern that recurrent arrests may be approaching futility but they are not willing to go down this route, patient remains full code.  Discussed with IR: would not offer mechanical thrombectomy if worsens overnight; if improved in AM with more stable hemodynamics there may be a window where intervention may help.  Again, really really tough situation.  Additional CC time 60 mins Rolan Sharps MD PCCM

## 2024-07-17 NOTE — Anesthesia Procedure Notes (Addendum)
 Central Venous Catheter Insertion Performed by: Darlyn Rush, MD, anesthesiologist Start/End10/26/2025 4:14 PM, 07/17/2024 4:24 PM Patient location: Pre-op. Preanesthetic checklist: patient identified, IV checked, site marked, risks and benefits discussed, surgical consent, monitors and equipment checked, pre-op evaluation, timeout performed and anesthesia consent Lidocaine  1% used for infiltration and patient sedated Hand hygiene performed  and maximum sterile barriers used  Catheter size: 8 Fr Total catheter length 16. Central line was placed.Double lumen Procedure performed using ultrasound to evaluate access site. Ultrasound Notes:relevant anatomy identified, ultrasound used to visualize needle entry, vessel patent under ultrasound and image(s) printed for medical record. Attempts: 1 Following insertion, dressing applied, line sutured and Biopatch. Post procedure assessment: blood return through all ports, free fluid flow and no air  Patient tolerated the procedure well with no immediate complications.

## 2024-07-17 NOTE — Progress Notes (Signed)
 Neurosurgery  I have reviewed the available data. 78 yo M with several weeks of neurologic symptoms with multiple falls and more acute recent worsening with paraplegia.  CT C-spine showing no structurally significant spine injury.  Left C7 TP fracture was seen by radiologist but I cannot identify this.  On MRI most prominently there is cord signal change C7-T1 concerning for spinal cord infarct or possibly transverse myelitis.  Nonhemorrhagic traumatic contusion in isolation seems unlikely given the mild stenosis.  There is some thin retrodiscal signal which could represent a tiny traumatic epidural hematoma, but I also see his WBC count is slightly elevated.  He apparently had viral symptoms a few weeks ago so I recommend MRI C-spine with contrast to assess for cord enhancement that sometimes can be seen with myelitis, as well as to assess for spine infection with subsequent cord infarct.  Blood cultures, CRP, ESR, pro-calcitonin can also be obtained.   Can consider neurology consult if myelitis concern persists for further workup. There is no ongoing cord compression nor any instability of the spine for which spine surgery is indicated currently.   Patient should wear C-collar for now however, until there is more clarity.

## 2024-07-17 NOTE — Consult Note (Signed)
 HPI:     Patient is a 78 y.o. male with PMH significant for HTN, T2DM, GERD, OSA who presented to [facility] ED after a fall this AM in his bathroom. His legs reportedly gave out on him and he hit his head/neck on the side of the sink. Following fall, he noted an inability to move or feel his lower extremities. He waited on the bathroom floor for 2hrs until his son arrived who called EMS. Additionally, patient reports bilateral lower extremity pain and decreased sensation onset spontaneously about 3 weeks ago which started in his feet and progressed to involve his knees. Also notes increased frequency of falls for about the same time period, so much so that he has actually started to use a cane.   Currently, c/o BLE numbness and weakness. Per chart review has had episodes of urinary incontinence however he does state he can sense the urge to urinate. No fecal incontinence. Denies BUE symptoms.  He was transferred to [facility] to undergo MRI, and for further eval/mgmt.     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   Precordial chest pain 12/23/2020   Chest pain    Abnormal ECG    Past Medical History:  Diagnosis Date   Arthritis    Diabetes mellitus without complication (HCC)    GERD (gastroesophageal reflux disease)    Hypertension    Sleep apnea     Past Surgical History:  Procedure Laterality Date   APPENDECTOMY     78 yo   BACK SURGERY  1975   lumbar diskectomy   done [facility]   COLONOSCOPY N/A 02/21/2015   Procedure: COLONOSCOPY;  Surgeon: Claudis RAYMOND Rivet, MD;  Location: [facility] END[facility];  Service: Endoscopy;  Laterality: N/A;  1025   COLONOSCOPY  11/13/2021   COLONOSCOPY WITH PROPOFOL  N/A 11/13/2021   Procedure: COLONOSCOPY WITH PROPOFOL ;  Surgeon: Rivet Claudis RAYMOND, MD;  Location: [facility];  Service: Endoscopy;  Laterality: N/A;  155   CYSTOSCOPY WITH URETHRAL DILATATION N/A 12/09/2022   Procedure:  CYSTOSCOPY WITH URETHRAL DILATATION;  Surgeon: Watt Rush, MD;  Location: [facility] ORS;  Service: Urology;  Laterality: N/A;   EYE SURGERY Bilateral    Lasik surgery   LEFT HEART CATH AND CORONARY ANGIOGRAPHY N/A 12/23/2020   Procedure: LEFT HEART CATH AND CORONARY ANGIOGRAPHY;  Surgeon: Burnard Debby LABOR, MD;  Location: [facility] INVASIVE CV LAB;  Service: Cardiovascular;  Laterality: N/A;   NECK SURGERY  1999   Work injury, posterior fusion   POLYPECTOMY  11/13/2021   Procedure: POLYPECTOMY INTESTINAL;  Surgeon: Rivet Claudis RAYMOND, MD;  Location: [facility];  Service: Endoscopy;;   SHOULDER SURGERY Bilateral 2000   rotoator cuff repair   done at [facility]   TONSILLECTOMY     TRANSURETHRAL INCISION OF PROSTATE N/A 12/09/2022   Procedure: TRANSURETHRAL RESECTION OF PROSTATE;  Surgeon: Watt Rush, MD;  Location: [facility] ORS;  Service: Urology;  Laterality: N/A;  1 HR FOR CASE    (Not in a hospital admission)  Allergies  Allergen Reactions   Penicillins Hives   Lucentis [Ranibizumab] Rash    Social History   Tobacco Use   Smoking status: Never   Smokeless tobacco: Never  Substance Use Topics   Alcohol use: No    History reviewed. No pertinent family history.    Objective:   Patient Vitals for the past 8 hrs:  BP Temp Temp src Pulse Resp  SpO2 Height Weight  07/17/24 0115 127/72 -- -- (!) 103 (!) 21 100 % -- --  07/17/24 0000 (!) 147/76 -- -- 91 (!) 25 100 % -- --  07/16/24 2345 129/74 -- -- 91 (!) 29 100 % -- --  07/16/24 2315 122/71 -- -- 90 (!) 29 97 % -- --  07/16/24 2300 130/69 -- -- (!) 105 (!) 23 100 % -- --  07/16/24 2245 138/76 -- -- 89 (!) 30 100 % -- --  07/16/24 2239 -- -- -- -- -- -- 6' 1 (1.854 m) 79.4 kg  07/16/24 2237 138/72 98.4 F (36.9 C) Oral 95 16 100 % -- --  07/16/24 2100 (!) 157/63 -- -- 87 19 100 % -- --  07/16/24 2030 (!) 147/65 -- -- 88 (!) 28 96 % -- --  07/16/24 2000 (!) 141/59 -- -- 86 (!) 25 95 % -- --  07/16/24 1900 (!) 152/75 -- -- 85 (!) 27 94 % -- --    No intake/output data recorded. No intake/output data recorded.  MR Cervical Spine Wo Contrast Result Date: 07/17/2024 EXAM: MRI CERVICAL, THORACIC, AND LUMBAR SPINE WITHOUT CONTRAST 07/17/2024 12:56:48 AM TECHNIQUE: Multiplanar multisequence MRI of the cervical, thoracic, and lumbar spine was performed without the administration of intravenous contrast. COMPARISON: None available. CLINICAL HISTORY: Neck trauma, ligament injury suspected (Age >= 16y). FINDINGS: BONES AND ALIGNMENT: Abnormal marrow signal within the anterior and superior T1 vertebral body, suggestive of fracture/contusion. No significant height loss. Left c7 transverse process fracture better characterized on ecent CT of the cervical spine. SPINAL CORD: Edematous and mildly enlarged spinal cord at c7-T1. SOFT TISSUES: Trace prevertebral edema at the cervicothoracic junction. Edema within the c7-T1 and T1-T2 interspinous ligaments, compatible with strain. There is also extensive surrounding paraspinal edema/strain. At C7-T1 there is disruption of the ligamentum flavum (for example, c series 1, image 8/9). Also, is there small volume epidural hemorrhage at this level (approximately 2-3 mm on series 4 image 42). The above findings and C7-T1 disc bulge result in moderate canal stenosis and bilateral foraminal stenosis at this level. Additional multilevel degenerative changes including severe right and moderate left foraminal stenosis at C3-C4 and C4-C5. Severe right and mild left foraminal stenosis at C5-C6. No significant canal stenosis besides C7-T1. No significant canal or foraminal stenosis in the thoracic spine. L1-L2: No significant disc herniation. No spinal canal stenosis or neural foraminal narrowing. L2-L3: No significant disc herniation. No spinal canal stenosis or neural foraminal narrowing. L3-L4: Mild disc bulge. No significant stenosis. L4-L5: Broad disc bulge with superimposed central disc protrusion. Bilateral facet arthropathy.  Resulting in mild canal, moderate right and mild left subarticular recess stenosis. Mild right foraminal stenosis. L5-S1: Mild disc bulging and endplate spurring. Bilateral facet arthropathy. No significant canal stenosis. Mild right subarticular recess stenosis. No significant foraminal stenosis. IMPRESSION: 1. Multiple traumatic C7-T1 findings including disruption of the ligamentum flavum with interspinous and posterior paraspinal ligamentous strain, traumatic cord contusion, and 2-3 mm thick epidural canal hemorrhage. 2. The above findings and C7-T1 disc bulge result in moderate canal and bilateral foraminal stenosis at this level. 3. Superior T1 endplate marrow edema, compatible with fracture/bone contusion given the above findings. No significant height loss. 4. Trace cervicothoracic prevertebral edema. 5. Superimposed multilevel degenerative changes including severe right foraminal stenosis at C3-C4, C4-C5 and C5-C6. Electronically signed by: Gilmore Molt MD 07/17/2024 01:39 AM EDT RP Workstation: [id number]   MR THORACIC SPINE WO CONTRAST Result Date: 07/17/2024 EXAM: MRI CERVICAL, THORACIC, AND LUMBAR  SPINE WITHOUT CONTRAST 07/17/2024 12:56:48 AM TECHNIQUE: Multiplanar multisequence MRI of the cervical, thoracic, and lumbar spine was performed without the administration of intravenous contrast. COMPARISON: None available. CLINICAL HISTORY: Neck trauma, ligament injury suspected (Age >= 16y). FINDINGS: BONES AND ALIGNMENT: Abnormal marrow signal within the anterior and superior T1 vertebral body, suggestive of fracture/contusion. No significant height loss. Left c7 transverse process fracture better characterized on ecent CT of the cervical spine. SPINAL CORD: Edematous and mildly enlarged spinal cord at c7-T1. SOFT TISSUES: Trace prevertebral edema at the cervicothoracic junction. Edema within the c7-T1 and T1-T2 interspinous ligaments, compatible with strain. There is also extensive surrounding  paraspinal edema/strain. At C7-T1 there is disruption of the ligamentum flavum (for example, c series 1, image 8/9). Also, is there small volume epidural hemorrhage at this level (approximately 2-3 mm on series 4 image 42). The above findings and C7-T1 disc bulge result in moderate canal stenosis and bilateral foraminal stenosis at this level. Additional multilevel degenerative changes including severe right and moderate left foraminal stenosis at C3-C4 and C4-C5. Severe right and mild left foraminal stenosis at C5-C6. No significant canal stenosis besides C7-T1. No significant canal or foraminal stenosis in the thoracic spine. L1-L2: No significant disc herniation. No spinal canal stenosis or neural foraminal narrowing. L2-L3: No significant disc herniation. No spinal canal stenosis or neural foraminal narrowing. L3-L4: Mild disc bulge. No significant stenosis. L4-L5: Broad disc bulge with superimposed central disc protrusion. Bilateral facet arthropathy. Resulting in mild canal, moderate right and mild left subarticular recess stenosis. Mild right foraminal stenosis. L5-S1: Mild disc bulging and endplate spurring. Bilateral facet arthropathy. No significant canal stenosis. Mild right subarticular recess stenosis. No significant foraminal stenosis. IMPRESSION: 1. Multiple traumatic C7-T1 findings including disruption of the ligamentum flavum with interspinous and posterior paraspinal ligamentous strain, traumatic cord contusion, and 2-3 mm thick epidural canal hemorrhage. 2. The above findings and C7-T1 disc bulge result in moderate canal and bilateral foraminal stenosis at this level. 3. Superior T1 endplate marrow edema, compatible with fracture/bone contusion given the above findings. No significant height loss. 4. Trace cervicothoracic prevertebral edema. 5. Superimposed multilevel degenerative changes including severe right foraminal stenosis at C3-C4, C4-C5 and C5-C6. Electronically signed by: Gilmore Molt  MD 07/17/2024 01:39 AM EDT RP Workstation: [id number]   MR LUMBAR SPINE WO CONTRAST Result Date: 07/17/2024 EXAM: MRI CERVICAL, THORACIC, AND LUMBAR SPINE WITHOUT CONTRAST 07/17/2024 12:56:48 AM TECHNIQUE: Multiplanar multisequence MRI of the cervical, thoracic, and lumbar spine was performed without the administration of intravenous contrast. COMPARISON: None available. CLINICAL HISTORY: Neck trauma, ligament injury suspected (Age >= 16y). FINDINGS: BONES AND ALIGNMENT: Abnormal marrow signal within the anterior and superior T1 vertebral body, suggestive of fracture/contusion. No significant height loss. Left c7 transverse process fracture better characterized on ecent CT of the cervical spine. SPINAL CORD: Edematous and mildly enlarged spinal cord at c7-T1. SOFT TISSUES: Trace prevertebral edema at the cervicothoracic junction. Edema within the c7-T1 and T1-T2 interspinous ligaments, compatible with strain. There is also extensive surrounding paraspinal edema/strain. At C7-T1 there is disruption of the ligamentum flavum (for example, c series 1, image 8/9). Also, is there small volume epidural hemorrhage at this level (approximately 2-3 mm on series 4 image 42). The above findings and C7-T1 disc bulge result in moderate canal stenosis and bilateral foraminal stenosis at this level. Additional multilevel degenerative changes including severe right and moderate left foraminal stenosis at C3-C4 and C4-C5. Severe right and mild left foraminal stenosis at C5-C6. No significant canal stenosis besides  C7-T1. No significant canal or foraminal stenosis in the thoracic spine. L1-L2: No significant disc herniation. No spinal canal stenosis or neural foraminal narrowing. L2-L3: No significant disc herniation. No spinal canal stenosis or neural foraminal narrowing. L3-L4: Mild disc bulge. No significant stenosis. L4-L5: Broad disc bulge with superimposed central disc protrusion. Bilateral facet arthropathy. Resulting in  mild canal, moderate right and mild left subarticular recess stenosis. Mild right foraminal stenosis. L5-S1: Mild disc bulging and endplate spurring. Bilateral facet arthropathy. No significant canal stenosis. Mild right subarticular recess stenosis. No significant foraminal stenosis. IMPRESSION: 1. Multiple traumatic C7-T1 findings including disruption of the ligamentum flavum with interspinous and posterior paraspinal ligamentous strain, traumatic cord contusion, and 2-3 mm thick epidural canal hemorrhage. 2. The above findings and C7-T1 disc bulge result in moderate canal and bilateral foraminal stenosis at this level. 3. Superior T1 endplate marrow edema, compatible with fracture/bone contusion given the above findings. No significant height loss. 4. Trace cervicothoracic prevertebral edema. 5. Superimposed multilevel degenerative changes including severe right foraminal stenosis at C3-C4, C4-C5 and C5-C6. Electronically signed by: Gilmore Molt MD 07/17/2024 01:39 AM EDT RP Workstation: [id number]   CT Head Wo Contrast Result Date: 07/16/2024 EXAM: CT HEAD WITHOUT CONTRAST 07/16/2024 07:29:51 PM TECHNIQUE: CT of the head was performed without the administration of intravenous contrast. Automated exposure control, iterative reconstruction, and/or weight based adjustment of the mA/kV was utilized to reduce the radiation dose to as low as reasonably achievable. COMPARISON: None available. CLINICAL HISTORY: Polytrauma, blunt. weakness to bilateral legs since Thursday. This morning fell and unable to get up. Son states pt has been falling multiple times recently. Pt admits to hitting his head with some of falls. Denies any blood thinners. FINDINGS: BRAIN AND VENTRICLES: No acute hemorrhage. No evidence of acute infarct. No hydrocephalus. No extra-axial collection. No mass effect or midline shift. Mild generalized atrophy and white matter disease. Atherosclerotic calcifications in bilateral cavernous carotid  arteries. ORBITS: No acute abnormality. Bilateral lens replacements noted. SINUSES: No acute abnormality. SOFT TISSUES AND SKULL: No acute soft tissue abnormality. No skull fracture. IMPRESSION: 1. No acute intracranial abnormality. Electronically signed by: Pinkie Pebbles MD 07/16/2024 07:40 PM EDT RP Workstation: [id number]   CT Thoracic Spine Wo Contrast Result Date: 07/16/2024 EXAM: CT THORACIC SPINE WITHOUT CONTRAST 07/16/2024 07:29:51 PM TECHNIQUE: CT of the thoracic spine was performed without the administration of intravenous contrast. Multiplanar reformatted images are provided for review. Automated exposure control, iterative reconstruction, and/or weight based adjustment of the mA/kV was utilized to reduce the radiation dose to as low as reasonably achievable. COMPARISON: None available. CLINICAL HISTORY: Back trauma, no prior imaging (Age >= 16y). weakness to bilateral legs since Thursday. This morning fell and unable to get up. Son states pt has been falling multiple times recently. Pt admits to hitting his head with some of falls. Denies any blood thinners. FINDINGS: BONES AND ALIGNMENT: Normal vertebral body heights. No acute fracture or suspicious bone lesion. Normal alignment. DEGENERATIVE CHANGES: Mild multilevel degenerative changes with bulky anterior osteophytosis. SOFT TISSUES: No acute abnormality. VASCULATURE: Mild thoracic aortic atherosclerosis. IMPRESSION: 1. No acute abnormality of the thoracic spine. Electronically signed by: Pinkie Pebbles MD 07/16/2024 07:39 PM EDT RP Workstation: [id number]   CT Lumbar Spine Wo Contrast Result Date: 07/16/2024 EXAM: CT OF THE LUMBAR SPINE WITHOUT CONTRAST 07/16/2024 07:29:51 PM TECHNIQUE: CT of the lumbar spine was performed without the administration of intravenous contrast. Multiplanar reformatted images are provided for review. Automated exposure control, iterative reconstruction, and/or weight based adjustment  of the mA/kV was utilized  to reduce the radiation dose to as low as reasonably achievable. COMPARISON: None available. CLINICAL HISTORY: Lumbar radiculopathy, trauma. weakness to bilateral legs since Thursday. This morning fell and unable to get up. Son states pt has been falling multiple times recently. Pt admits to hitting his head with some of falls. Denies any blood thinners. FINDINGS: BONES AND ALIGNMENT: Normal vertebral body heights. No acute fracture or suspicious bone lesion. Normal alignment. DEGENERATIVE CHANGES: Mild multilevel degenerative changes, most prominent at L5-S1, with bulky osteophytosis involving the lower lumbar spine. SOFT TISSUES: Vascular calcifications. IMPRESSION: 1. No acute osseous abnormality. Electronically signed by: Pinkie Pebbles MD 07/16/2024 07:37 PM EDT RP Workstation: [id number]   CT Cervical Spine Wo Contrast Result Date: 07/16/2024 EXAM: CT CERVICAL SPINE WITHOUT CONTRAST 07/16/2024 07:29:51 PM TECHNIQUE: CT of the cervical spine was performed without the administration of intravenous contrast. Multiplanar reformatted images are provided for review. Automated exposure control, iterative reconstruction, and/or weight based adjustment of the mA/kV was utilized to reduce the radiation dose to as low as reasonably achievable. COMPARISON: None available. CLINICAL HISTORY: Polytrauma, blunt. weakness to bilateral legs since Thursday. This morning fell and unable to get up. Son states pt has been falling multiple times recently. Pt admits to hitting his head with some of falls. Denies any blood thinners. FINDINGS: CERVICAL SPINE: BONES AND ALIGNMENT: Mildly displaced fracture of the left transverse process at C7 (coronal image 51). Vertebral body heights are maintained. DEGENERATIVE CHANGES: Mild to moderate multilevel degenerative changes with bulky anterior osteophytosis along the lower cervical / upper thoracic spine. SOFT TISSUES: No prevertebral soft tissue swelling. IMPRESSION: 1. Mildly  displaced fracture of the left transverse process at C7. Electronically signed by: Pinkie Pebbles MD 07/16/2024 07:36 PM EDT RP Workstation: [id number]   Awake, alert, oriented WDWN, NAD Speech fluent, appropriate CN grossly intact 5/5 BUE, DTR 2+, SILT 0/5 BLE, DTR 1+ Sensation absent umbilicus distally to entire RLE Sensation intact distal LLE, decreased proximally Neg clonus Neg hoffman  Appropriate tone   Assessment/Plan:   78 yo M with several weeks of neurologic symptoms with multiple falls and more acute recent worsening with paraplegia.  CT C-spine showing no structurally significant spine injury.  Left C7 TP fracture was seen by radiologist but I cannot identify this.  On MRI most prominently there is cord signal change C7-T1 concerning for spinal cord infarct or possibly transverse myelitis.  Nonhemorrhagic traumatic contusion in isolation seems unlikely given the mild stenosis.  There is some thin retrodiscal signal which could represent a tiny traumatic epidural hematoma, but I also see his WBC count is slightly elevated.  He apparently had viral symptoms a few weeks ago so I recommend MRI C-spine with contrast to assess for cord enhancement that sometimes can be seen with myelitis, as well as to assess for spine infection with subsequent cord infarct.  Blood cultures, CRP, ESR, pro-calcitonin can also be obtained.   Can consider neurology consult if myelitis concern persists for further workup. There is no ongoing cord compression nor any instability of the spine for which spine surgery is indicated currently.   Patient should wear C-collar for now however, until there is more clarity.   Flor Houdeshell CAYLIN Faisal Stradling, PA-C

## 2024-07-17 NOTE — ED Notes (Signed)
 Patient transported to CT

## 2024-07-17 NOTE — OR Nursing (Signed)
 Procedure not completed due to deterioration of pt. condition. MD orders followed.

## 2024-07-17 NOTE — Procedures (Signed)
 Cardiopulmonary Resuscitation Note  [name]  [id number]  1946/01/17  Date:07/17/24  Time:4:06 PM   Provider Performing:Anahlia Iseminger   Procedure: Cardiopulmonary Resuscitation [phone])  Indication(s) Loss of Pulse  Consent N/A  Anesthesia N/A   Time Out N/A   Sterile Technique Hand hygiene, gloves   Procedure Description Called to patient's room for CODE BLUE. Initial rhythm was PEA/Asystole. Patient received high quality chest compressions for 2 minutes with defibrillation or cardioversion when appropriate. Epinephrine was administered every 3 minutes as directed by time biomedical engineer. Additional pharmacologic interventions included calcium chloride and sodium bicarbonate. Additional procedural interventions include bedside echo.  Return of spontaneous circulation was achieved.  Family called and notified.   Complications/Tolerance N/A   EBL N/A   Specimen(s) N/A  Estimated time to ROSC: 2 minutes

## 2024-07-17 NOTE — Anesthesia Preprocedure Evaluation (Addendum)
 Anesthesia Evaluation  Patient identified by MRN, date of birth, ID band Patient awake    Reviewed: Allergy & Precautions, NPO status , Patient's Chart, lab work & pertinent test results  Airway Mallampati: III  TM Distance: >3 FB Neck ROM: Full    Dental  (+) Dental Advisory Given, Teeth Intact   Pulmonary sleep apnea    Pulmonary exam normal breath sounds clear to auscultation       Cardiovascular hypertension, Pt. on medications Normal cardiovascular exam Rhythm:Regular Rate:Normal  Echo 2022  1. Left ventricular ejection fraction, by estimation, is 60 to 65%. The  left ventricle has normal function. The left ventricle has no regional  wall motion abnormalities. There is moderate asymmetric left ventricular  hypertrophy of the basal and septal  segments. Left ventricular diastolic parameters were normal.   2. Right ventricular systolic function is normal. The right ventricular  size is normal. There is normal pulmonary artery systolic pressure.   3. The pericardial effusion is anterior to the right ventricle.   4. The mitral valve is normal in structure. No evidence of mitral valve  regurgitation. No evidence of mitral stenosis.   5. The aortic valve is tricuspid. There is mild calcification of the  aortic valve. Aortic valve regurgitation is mild. Mild aortic valve  sclerosis is present, with no evidence of aortic valve stenosis.   6. The inferior vena cava is normal in size with greater than 50%  respiratory variability, suggesting right atrial pressure of 3 mmHg.     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    Musculoskeletal  (+) Arthritis , Osteoarthritis,  Hx/o  cervical and lumbar surgery   Abdominal   Peds  Hematology   Anesthesia Other Findings   Reproductive/Obstetrics                              Anesthesia Physical Anesthesia Plan  ASA: 3 and emergent  Anesthesia Plan: General   Post-op Pain Management: Ofirmev  IV (intra-op)*   Induction: Intravenous  PONV Risk Score and Plan: 3 and Treatment may vary due to age or medical condition, Ondansetron  and Dexamethasone   Airway Management Planned: Oral ETT and Video Laryngoscope Planned  Additional Equipment: ClearSight  Intra-op Plan:   Post-operative Plan: Extubation in OR  Informed Consent: I have reviewed the patients History and Physical, chart, labs and discussed the procedure including the risks, benefits and alternatives for the proposed anesthesia with the patient or authorized representative who has indicated his/her understanding and acceptance.     Dental advisory given  Plan Discussed with: CRNA  Anesthesia Plan Comments:          Anesthesia Quick Evaluation

## 2024-07-17 NOTE — Anesthesia Postprocedure Evaluation (Signed)
 Anesthesia Post Note  Patient: Lawrence [name]  Procedure(s) Performed: ANTERIOR CERVICAL DECOMPRESSION/DISCECTOMY FUSION, CERVICAL SEVEN - THORACIC ONE (Neck)     Patient location during evaluation: SICU Anesthesia Type: General Level of consciousness: sedated and patient remains intubated per anesthesia plan Pain management: pain level controlled Vital Signs Assessment: vitals unstable Respiratory status: patient remains intubated per anesthesia plan and patient on ventilator - see flowsheet for VS Cardiovascular status: stable Anesthetic complications: no   No notable events documented.  Last Vitals:  Vitals:   07/17/24 1825 07/17/24 1826  BP:    Pulse: (!) 134 (!) 134  Resp: (!) 28 (!) 28  Temp:    SpO2: 100% 100%    Last Pain:  Vitals:   07/17/24 1140  TempSrc: Oral  PainSc: 6                  Norleen Pope

## 2024-07-17 NOTE — H&P (Signed)
 History and Physical    [name] FMW:[id number] DOB: [birth year] DOA: 07/16/2024  PCP: Lawrence Vaughn FALCON, MD   Patient coming from: Home   Chief Complaint:  Chief Complaint  Patient presents with   Weakness    HPI:  Lawrence [name] is a 78 y.o. male with hx of DM2, HTN, OSA, BPH s/p TURP, who presented after ground level fall. Reports that has been well other than developing pins and needles in his feet only over the past few weeks, nothing more proximally. Has been more unsteady and using a cane related to his sensory changes. However today was standing at his sink and he slipped and fell and hit his head on the sink. After this he had sudden onset of numbness in his legs, and inability to sit up, complete loss of function in his legs. Says that he also has developed new urinary incontinence. He is not taking any antiplatelets or anticoagulant medications.    Review of Systems:  ROS complete and negative except as marked above   Allergies  Allergen Reactions   Penicillins Hives   Lucentis [Ranibizumab] Rash    Prior to Admission medications   Medication Sig Start Date End Date Taking? Authorizing Provider  Apple Cider Vinegar 600 MG CAPS Take 600 mg by mouth daily.    [provider]  aspirin  EC 81 MG tablet Take 1 tablet (81 mg total) by mouth every other day. 11/14/21   Rehman, Lawrence PENNER, MD  Barberry-Oreg Grape-Goldenseal (BERBERINE COMPLEX PO) Take 1 capsule by mouth daily.    [provider]  cholecalciferol (VITAMIN D3) 25 MCG (1000 UNIT) tablet Take 1,000 Units by mouth daily.    [provider]  Cinnamon 500 MG capsule Take 500 mg by mouth daily.    [provider]  diphenhydrAMINE HCl (NERVINE PO) Take 1 tablet by mouth daily. Patient states that he takes one a day    [provider]  ferrous sulfate 325 (65 FE) MG tablet Take 325 mg by mouth daily with breakfast.    [provider]  hydrochlorothiazide   (HYDRODIURIL ) 25 MG tablet Take 25 mg by mouth daily. 07/23/21   [provider]  insulin  degludec (TRESIBA  FLEXTOUCH) 100 UNIT/ML FlexTouch Pen Inject 12 Units into the skin at bedtime. 10/07/22   Lawrence Benton PARAS, NP  Multiple Vitamins-Minerals (OCUVITE ADULT 50+ PO) Take 1 tablet by mouth daily.    [provider]  OVER THE COUNTER MEDICATION Take 1 capsule by mouth daily. Prostagenix    [provider]  OVER THE COUNTER MEDICATION Take 1 capsule by mouth daily. Diabacore (Diabetic Supplement)    [provider]  OVER THE COUNTER MEDICATION Take 1 tablet by mouth in the morning and at bedtime. Glucoceil    [provider]  tadalafil  (CIALIS ) 5 MG tablet Take 1 tablet (5 mg total) by mouth daily as needed for erectile dysfunction. 12/06/20   Wrenn, John, MD  vitamin E 180 MG (400 UNITS) capsule Take 400 Units by mouth daily.    [provider]  Vitamin Mixture (ESTER-C PO) Take 1,000 mg by mouth daily.    [provider]    Past Medical History:  Diagnosis Date   Arthritis    Diabetes mellitus without complication (HCC)    GERD (gastroesophageal reflux disease)    Hypertension    Sleep apnea     Past Surgical History:  Procedure Laterality Date   APPENDECTOMY     78 yo  BACK SURGERY  1975   lumbar diskectomy   done [facility]   COLONOSCOPY N/A 02/21/2015   Procedure: COLONOSCOPY;  Surgeon: Lawrence [name] Rivet, MD;  Location: [facility];  Service: Endoscopy;  Laterality: N/A;  1025   COLONOSCOPY  11/13/2021   COLONOSCOPY WITH PROPOFOL  N/A 11/13/2021   Procedure: COLONOSCOPY WITH PROPOFOL ;  Surgeon: [name] Lawrence RAYMOND, MD;  Location: [facility];  Service: Endoscopy;  Laterality: N/A;  155   CYSTOSCOPY WITH URETHRAL DILATATION N/A 12/09/2022   Procedure: CYSTOSCOPY WITH URETHRAL DILATATION;  Surgeon: Lawrence Rush, MD;  Location: [facility];  Service: Urology;  Laterality: N/A;   EYE SURGERY Bilateral    Lasik surgery    LEFT HEART CATH AND CORONARY ANGIOGRAPHY N/A 12/23/2020   Procedure: LEFT HEART CATH AND CORONARY ANGIOGRAPHY;  Surgeon: Lawrence Lawrence LABOR, MD;  Location: [facility];  Service: Cardiovascular;  Laterality: N/A;   NECK SURGERY  1999   Work injury, posterior fusion   POLYPECTOMY  11/13/2021   Procedure: POLYPECTOMY INTESTINAL;  Surgeon: [name] Lawrence RAYMOND, MD;  Location: [facility];  Service: Endoscopy;;   SHOULDER SURGERY Bilateral 2000   rotoator cuff repair   done at [facility]   TONSILLECTOMY     TRANSURETHRAL INCISION OF PROSTATE N/A 12/09/2022   Procedure: TRANSURETHRAL RESECTION OF PROSTATE;  Surgeon: Lawrence Rush, MD;  Location: [facility];  Service: Urology;  Laterality: N/A;  1 HR FOR CASE     reports that he has never smoked. He has never used smokeless tobacco. He reports that he does not drink alcohol and does not use drugs.  History reviewed. No pertinent family history.   Physical Exam: Vitals:   07/17/24 0115 07/17/24 0200 07/17/24 0320 07/17/24 0423  BP: 127/72 (!) 140/67 124/67   Pulse: (!) 103 93 96   Resp: (!) 21 (!) 25 (!) 28   Temp:    98.7 F (37.1 C)  TempSrc:    Oral  SpO2: 100% 100% 100%   Weight:      Height:        Gen: Awake, alert, NAD   CV: Regular, normal S1, S2, no murmurs  Resp: Normal WOB, CTAB  Abd: Flat, normoactive, nontender MSK: Symmetric, no edema  Skin: No rashes or lesions to exposed skin  Neuro: Alert and interactive, CN 2-12 intact. Motor 4/5 with elbow extension, wrist flexion / extension and finger Abduction, otherwise 5/5 more proximally in UE. Lower extremities are 0/5 bilaterally. DTR is 3+ at the triceps. 0 at the knee / achilles. No clonus at the ankle. There is a sensory level around the level of the umbilicus, numbness bilaterally below this level.  Psych: euthymic, appropriate    Data review:   Labs reviewed, notable for:   Blood glucose 361, A1c 12.3% CK 1321 WBC 12 -> 9.7 Hemoglobin 14 -> 12   Micro:   Results for orders placed or performed in visit on 02/12/23  Microscopic Examination     Status: Abnormal   Collection Time: 02/12/23  3:00 PM   Urine  Result Value Ref Range Status   WBC, UA 11-30 (A) 0 - 5 /hpf Final   RBC, Urine 0-2 0 - 2 /hpf Final   Epithelial Cells (non renal) 0-10 0 - 10 /hpf Final   Bacteria, UA None seen None seen/Few Final    Imaging reviewed:  MR Cervical Spine Wo Contrast Result Date: 07/17/2024 EXAM: MRI CERVICAL, THORACIC, AND LUMBAR SPINE WITHOUT CONTRAST 07/17/2024 12:56:48  AM TECHNIQUE: Multiplanar multisequence MRI of the cervical, thoracic, and lumbar spine was performed without the administration of intravenous contrast. COMPARISON: None available. CLINICAL HISTORY: Neck trauma, ligament injury suspected (Age >= 16y). FINDINGS: BONES AND ALIGNMENT: Abnormal marrow signal within the anterior and superior T1 vertebral body, suggestive of fracture/contusion. No significant height loss. Left c7 transverse process fracture better characterized on ecent CT of the cervical spine. SPINAL CORD: Edematous and mildly enlarged spinal cord at c7-T1. SOFT TISSUES: Trace prevertebral edema at the cervicothoracic junction. Edema within the c7-T1 and T1-T2 interspinous ligaments, compatible with strain. There is also extensive surrounding paraspinal edema/strain. At C7-T1 there is disruption of the ligamentum flavum (for example, c series 1, image 8/9). Also, is there small volume epidural hemorrhage at this level (approximately 2-3 mm on series 4 image 42). The above findings and C7-T1 disc bulge result in moderate canal stenosis and bilateral foraminal stenosis at this level. Additional multilevel degenerative changes including severe right and moderate left foraminal stenosis at C3-C4 and C4-C5. Severe right and mild left foraminal stenosis at C5-C6. No significant canal stenosis besides C7-T1. No significant canal or foraminal stenosis in the thoracic spine. L1-L2: No  significant disc herniation. No spinal canal stenosis or neural foraminal narrowing. L2-L3: No significant disc herniation. No spinal canal stenosis or neural foraminal narrowing. L3-L4: Mild disc bulge. No significant stenosis. L4-L5: Broad disc bulge with superimposed central disc protrusion. Bilateral facet arthropathy. Resulting in mild canal, moderate right and mild left subarticular recess stenosis. Mild right foraminal stenosis. L5-S1: Mild disc bulging and endplate spurring. Bilateral facet arthropathy. No significant canal stenosis. Mild right subarticular recess stenosis. No significant foraminal stenosis. IMPRESSION: 1. Multiple traumatic C7-T1 findings including disruption of the ligamentum flavum with interspinous and posterior paraspinal ligamentous strain, traumatic cord contusion, and 2-3 mm thick epidural canal hemorrhage. 2. The above findings and C7-T1 disc bulge result in moderate canal and bilateral foraminal stenosis at this level. 3. Superior T1 endplate marrow edema, compatible with fracture/bone contusion given the above findings. No significant height loss. 4. Trace cervicothoracic prevertebral edema. 5. Superimposed multilevel degenerative changes including severe right foraminal stenosis at C3-C4, C4-C5 and C5-C6. Electronically signed by: Gilmore Molt MD 07/17/2024 01:39 AM EDT RP Workstation: [id number]   MR THORACIC SPINE WO CONTRAST Result Date: 07/17/2024 EXAM: MRI CERVICAL, THORACIC, AND LUMBAR SPINE WITHOUT CONTRAST 07/17/2024 12:56:48 AM TECHNIQUE: Multiplanar multisequence MRI of the cervical, thoracic, and lumbar spine was performed without the administration of intravenous contrast. COMPARISON: None available. CLINICAL HISTORY: Neck trauma, ligament injury suspected (Age >= 16y). FINDINGS: BONES AND ALIGNMENT: Abnormal marrow signal within the anterior and superior T1 vertebral body, suggestive of fracture/contusion. No significant height loss. Left c7 transverse process  fracture better characterized on ecent CT of the cervical spine. SPINAL CORD: Edematous and mildly enlarged spinal cord at c7-T1. SOFT TISSUES: Trace prevertebral edema at the cervicothoracic junction. Edema within the c7-T1 and T1-T2 interspinous ligaments, compatible with strain. There is also extensive surrounding paraspinal edema/strain. At C7-T1 there is disruption of the ligamentum flavum (for example, c series 1, image 8/9). Also, is there small volume epidural hemorrhage at this level (approximately 2-3 mm on series 4 image 42). The above findings and C7-T1 disc bulge result in moderate canal stenosis and bilateral foraminal stenosis at this level. Additional multilevel degenerative changes including severe right and moderate left foraminal stenosis at C3-C4 and C4-C5. Severe right and mild left foraminal stenosis at C5-C6. No significant canal stenosis besides C7-T1. No significant canal or  foraminal stenosis in the thoracic spine. L1-L2: No significant disc herniation. No spinal canal stenosis or neural foraminal narrowing. L2-L3: No significant disc herniation. No spinal canal stenosis or neural foraminal narrowing. L3-L4: Mild disc bulge. No significant stenosis. L4-L5: Broad disc bulge with superimposed central disc protrusion. Bilateral facet arthropathy. Resulting in mild canal, moderate right and mild left subarticular recess stenosis. Mild right foraminal stenosis. L5-S1: Mild disc bulging and endplate spurring. Bilateral facet arthropathy. No significant canal stenosis. Mild right subarticular recess stenosis. No significant foraminal stenosis. IMPRESSION: 1. Multiple traumatic C7-T1 findings including disruption of the ligamentum flavum with interspinous and posterior paraspinal ligamentous strain, traumatic cord contusion, and 2-3 mm thick epidural canal hemorrhage. 2. The above findings and C7-T1 disc bulge result in moderate canal and bilateral foraminal stenosis at this level. 3. Superior T1  endplate marrow edema, compatible with fracture/bone contusion given the above findings. No significant height loss. 4. Trace cervicothoracic prevertebral edema. 5. Superimposed multilevel degenerative changes including severe right foraminal stenosis at C3-C4, C4-C5 and C5-C6. Electronically signed by: Gilmore Molt MD 07/17/2024 01:39 AM EDT RP Workstation: [id number]   MR LUMBAR SPINE WO CONTRAST Result Date: 07/17/2024 EXAM: MRI CERVICAL, THORACIC, AND LUMBAR SPINE WITHOUT CONTRAST 07/17/2024 12:56:48 AM TECHNIQUE: Multiplanar multisequence MRI of the cervical, thoracic, and lumbar spine was performed without the administration of intravenous contrast. COMPARISON: None available. CLINICAL HISTORY: Neck trauma, ligament injury suspected (Age >= 16y). FINDINGS: BONES AND ALIGNMENT: Abnormal marrow signal within the anterior and superior T1 vertebral body, suggestive of fracture/contusion. No significant height loss. Left c7 transverse process fracture better characterized on ecent CT of the cervical spine. SPINAL CORD: Edematous and mildly enlarged spinal cord at c7-T1. SOFT TISSUES: Trace prevertebral edema at the cervicothoracic junction. Edema within the c7-T1 and T1-T2 interspinous ligaments, compatible with strain. There is also extensive surrounding paraspinal edema/strain. At C7-T1 there is disruption of the ligamentum flavum (for example, c series 1, image 8/9). Also, is there small volume epidural hemorrhage at this level (approximately 2-3 mm on series 4 image 42). The above findings and C7-T1 disc bulge result in moderate canal stenosis and bilateral foraminal stenosis at this level. Additional multilevel degenerative changes including severe right and moderate left foraminal stenosis at C3-C4 and C4-C5. Severe right and mild left foraminal stenosis at C5-C6. No significant canal stenosis besides C7-T1. No significant canal or foraminal stenosis in the thoracic spine. L1-L2: No significant disc  herniation. No spinal canal stenosis or neural foraminal narrowing. L2-L3: No significant disc herniation. No spinal canal stenosis or neural foraminal narrowing. L3-L4: Mild disc bulge. No significant stenosis. L4-L5: Broad disc bulge with superimposed central disc protrusion. Bilateral facet arthropathy. Resulting in mild canal, moderate right and mild left subarticular recess stenosis. Mild right foraminal stenosis. L5-S1: Mild disc bulging and endplate spurring. Bilateral facet arthropathy. No significant canal stenosis. Mild right subarticular recess stenosis. No significant foraminal stenosis. IMPRESSION: 1. Multiple traumatic C7-T1 findings including disruption of the ligamentum flavum with interspinous and posterior paraspinal ligamentous strain, traumatic cord contusion, and 2-3 mm thick epidural canal hemorrhage. 2. The above findings and C7-T1 disc bulge result in moderate canal and bilateral foraminal stenosis at this level. 3. Superior T1 endplate marrow edema, compatible with fracture/bone contusion given the above findings. No significant height loss. 4. Trace cervicothoracic prevertebral edema. 5. Superimposed multilevel degenerative changes including severe right foraminal stenosis at C3-C4, C4-C5 and C5-C6. Electronically signed by: Gilmore Molt MD 07/17/2024 01:39 AM EDT RP Workstation: [id number]   CT Head Wo  Contrast Result Date: 07/16/2024 EXAM: CT HEAD WITHOUT CONTRAST 07/16/2024 07:29:51 PM TECHNIQUE: CT of the head was performed without the administration of intravenous contrast. Automated exposure control, iterative reconstruction, and/or weight based adjustment of the mA/kV was utilized to reduce the radiation dose to as low as reasonably achievable. COMPARISON: None available. CLINICAL HISTORY: Polytrauma, blunt. weakness to bilateral legs since Thursday. This morning fell and unable to get up. Son states pt has been falling multiple times recently. Pt admits to hitting his head  with some of falls. Denies any blood thinners. FINDINGS: BRAIN AND VENTRICLES: No acute hemorrhage. No evidence of acute infarct. No hydrocephalus. No extra-axial collection. No mass effect or midline shift. Mild generalized atrophy and white matter disease. Atherosclerotic calcifications in bilateral cavernous carotid arteries. ORBITS: No acute abnormality. Bilateral lens replacements noted. SINUSES: No acute abnormality. SOFT TISSUES AND SKULL: No acute soft tissue abnormality. No skull fracture. IMPRESSION: 1. No acute intracranial abnormality. Electronically signed by: Pinkie Pebbles MD 07/16/2024 07:40 PM EDT RP Workstation: [id number]   CT Thoracic Spine Wo Contrast Result Date: 07/16/2024 EXAM: CT THORACIC SPINE WITHOUT CONTRAST 07/16/2024 07:29:51 PM TECHNIQUE: CT of the thoracic spine was performed without the administration of intravenous contrast. Multiplanar reformatted images are provided for review. Automated exposure control, iterative reconstruction, and/or weight based adjustment of the mA/kV was utilized to reduce the radiation dose to as low as reasonably achievable. COMPARISON: None available. CLINICAL HISTORY: Back trauma, no prior imaging (Age >= 16y). weakness to bilateral legs since Thursday. This morning fell and unable to get up. Son states pt has been falling multiple times recently. Pt admits to hitting his head with some of falls. Denies any blood thinners. FINDINGS: BONES AND ALIGNMENT: Normal vertebral body heights. No acute fracture or suspicious bone lesion. Normal alignment. DEGENERATIVE CHANGES: Mild multilevel degenerative changes with bulky anterior osteophytosis. SOFT TISSUES: No acute abnormality. VASCULATURE: Mild thoracic aortic atherosclerosis. IMPRESSION: 1. No acute abnormality of the thoracic spine. Electronically signed by: Pinkie Pebbles MD 07/16/2024 07:39 PM EDT RP Workstation: [id number]   CT Lumbar Spine Wo Contrast Result Date: 07/16/2024 EXAM: CT OF  THE LUMBAR SPINE WITHOUT CONTRAST 07/16/2024 07:29:51 PM TECHNIQUE: CT of the lumbar spine was performed without the administration of intravenous contrast. Multiplanar reformatted images are provided for review. Automated exposure control, iterative reconstruction, and/or weight based adjustment of the mA/kV was utilized to reduce the radiation dose to as low as reasonably achievable. COMPARISON: None available. CLINICAL HISTORY: Lumbar radiculopathy, trauma. weakness to bilateral legs since Thursday. This morning fell and unable to get up. Son states pt has been falling multiple times recently. Pt admits to hitting his head with some of falls. Denies any blood thinners. FINDINGS: BONES AND ALIGNMENT: Normal vertebral body heights. No acute fracture or suspicious bone lesion. Normal alignment. DEGENERATIVE CHANGES: Mild multilevel degenerative changes, most prominent at L5-S1, with bulky osteophytosis involving the lower lumbar spine. SOFT TISSUES: Vascular calcifications. IMPRESSION: 1. No acute osseous abnormality. Electronically signed by: Pinkie Pebbles MD 07/16/2024 07:37 PM EDT RP Workstation: [id number]   CT Cervical Spine Wo Contrast Result Date: 07/16/2024 EXAM: CT CERVICAL SPINE WITHOUT CONTRAST 07/16/2024 07:29:51 PM TECHNIQUE: CT of the cervical spine was performed without the administration of intravenous contrast. Multiplanar reformatted images are provided for review. Automated exposure control, iterative reconstruction, and/or weight based adjustment of the mA/kV was utilized to reduce the radiation dose to as low as reasonably achievable. COMPARISON: None available. CLINICAL HISTORY: Polytrauma, blunt. weakness to bilateral legs since Thursday. This morning  fell and unable to get up. Son states pt has been falling multiple times recently. Pt admits to hitting his head with some of falls. Denies any blood thinners. FINDINGS: CERVICAL SPINE: BONES AND ALIGNMENT: Mildly displaced fracture of the  left transverse process at C7 (coronal image 51). Vertebral body heights are maintained. DEGENERATIVE CHANGES: Mild to moderate multilevel degenerative changes with bulky anterior osteophytosis along the lower cervical / upper thoracic spine. SOFT TISSUES: No prevertebral soft tissue swelling. IMPRESSION: 1. Mildly displaced fracture of the left transverse process at C7. Electronically signed by: Pinkie Pebbles MD 07/16/2024 07:36 PM EDT RP Workstation: [id number]    EKG:  Personally reviewed, sinus rhythm, borderline first-degree AV block, T wave inversion laterally  ED Course:  Transferred to ED from [facility] due to concern for acute onset of paraplegia after fall.  Subsequent MRI concerning for traumatic findings as detailed.  EDP is consulted with neurosurgery Dr. Sebastian and PA Johnanna, and patient was seen in person by PA.  Per note no ongoing cord compression or instability for which spine surgery is indicated, recommended for c-collar, evaluation for alternative cause of his spinal cord abnormality including MRI C spine ww/o.  Admission requested by [facility]    Assessment/Plan:  78 y.o. male with hx DM2, HTN, OSA, BPH s/p TURP, who presented after ground level fall complicated by cervical spine trauma with spinal cord injury and resultant paraplegia  Ground-level fall C7-T1 spinal cord injury  Fracture of previously ankylosed anterior C7-T1 level, ligamentous disruption and traumatic cord injury, Cord contusion and edema. ? L C8 traumatic nerve root avulsion  -- Neurosurgery has been consulted, seen in person by PA Johnanna; I have paged out re: updated result of MRI w wo contrast, which is most consistent with spinal cord trauma rather than an inflammatory or infectious process. Discussed with PA Johnanna, who has reviewed the new MRI result. Discussed that presentation seems more consistent with an acute traumatic injury. However, on review of the film that there is no significant  stenosis or compression, and so unclear benefit of emergent surgical intervention at this time. Discussed that appears symptoms may be related to significant cord contusion / edema. Dr. Debby will be in to consult on patient this morning, and rec to continue NPO, C collar prior to evaluation.   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  Hold some hydrochlorothiazide .  OSA: Hold off on CPAP with his Ccollar   BPH s/p TURP: Noted    Body mass index is 23.09 kg/m.    DVT prophylaxis:  SCDs Code Status:  Full Code Diet:  Diet Orders (From admission, onward)    None      Family Communication:  Yes discussed with his son at the bedside   Consults:  Neurosurgery   Admission status:   Inpatient,  Step Down Unit  Severity of Illness: The appropriate patient status for this patient is INPATIENT. Inpatient status is judged to be reasonable and necessary in order to provide the required intensity of service to ensure the patient's safety. The patient's presenting symptoms, physical exam findings, and initial radiographic and laboratory data in the context of their chronic comorbidities is felt to place them at high risk for further clinical deterioration. Furthermore, it is not anticipated that the patient will be medically stable for  discharge from the hospital within 2 midnights of admission.   * I certify that at the point of admission it is my clinical judgment that the patient will require inpatient hospital care spanning beyond 2 midnights from the point of admission due to high intensity of service, high risk for further deterioration and high frequency of surveillance required.*   Dorn Dawson, MD [facility]  How to contact the [facility] Attending or Consulting provider 7A - 7P or covering provider during after hours 7P -7A, for this patient.  Check the care team in [facility] and look for a) attending/consulting [facility] provider listed and b) the [facility] team listed Log into [email or web address] and use [place]'s universal password to access. If you do not have the password, please contact the hospital operator. Locate the [facility] provider you are looking for under [facility] and page to a number that you can be directly reached. If you still have difficulty reaching the provider, please page the [facility] (Director on Call) for the Hospitalists listed on amion for assistance.  07/17/2024, 5:15 AM

## 2024-07-17 NOTE — ED Notes (Addendum)
 Patient took 32 units of Reli On Tresiba  from his home medication. Alerted EDP Theadore, MD.

## 2024-07-17 NOTE — Progress Notes (Signed)
 Rt accompanied trip to CT and  back to ICU without incident.

## 2024-07-17 NOTE — Op Note (Signed)
 PREOP DIAGNOSIS: spinal cord injury  POSTOP DIAGNOSIS: spinal cord injury, cardiac arrest   PROCEDURE: 1. Partial anterior cervical discectomy, C7-T1, aborted  SURGEON: Dr. Dorn Ned, MD  ASSISTANT: Camie Pickle.  Please note, no qualified trainees were available to assist with the procedure.  Assistance was required for retraction of the visceral structures to safely allow for instrumentation.  ANESTHESIA: General Endotracheal  EBL: 10 ml  IMPLANTS: none  SPECIMENS: None  DRAINS: None  COMPLICATIONS: Cardiac arrest   CONDITION: To ICU in critical condition  HISTORY: Lawrence [name] is a 78 y.o. y.o. male with type 2 DM who developed progressive myelopathy symptoms over several weeks, with more precipitous decline over past 4 days after several falls, including paraplegia and hand weakness since yesterday morning.  He was found to have a T1 fracture, small epidural hematoma adjacent to a posterior disc osteophyte, mild posterior ligamentous disruption and significant spinal cord swelling and signal change.  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  Risks, benefits, alternatives, and expected convalescence were discussed with the patient and son.  Risks discussed included but were not limited to bleeding, pain, infection, dysphagia, dysphonia, pseudoarthrosis, hardware failure, adjacent segment disease, CSF leak, neurologic deficits, weakness, numbness, paralysis, coma, and death. After all questions were answered, informed consent was obtained.  PROCEDURE IN DETAIL: The patient was brought to the operating room and transferred to the operative table. After induction of general anesthesia, the patient was positioned on the operative table in the supine position with all pressure points meticulously padded. After appropriate lines and monitors were placed, the skin of the neck was then  prepped and draped in the usual sterile fashion.  After timeout was conducted, the skin was infiltrated with local anesthetic. Skin incision was then made sharply over left anterior neckand Bovie electrocautery was used to dissect the subcutaneous tissue until the platysma was identified. The platysma was then divided and undermined. The sternocleidomastoid muscle was then identified and, utilizing natural fascial planes in the neck, the prevertebral fascia was identified and the carotid sheath was retracted laterally and the trachea and esophagus retracted medially.  There was small amount of hemorrhage in the prevertebral fascia over the disc space.   Again using fluoroscopy, the C7-T1 disc space was identified. Bovie electrocautery was used to dissect in the subperiosteal plane and elevate the bilateral longus coli muscles. Large anterior osteohytes were removed with rongeurs.  Self-retaining retractors were then placed.   The disc space was incised sharply and curettes and rongeurs were use to perform initial discectomy.   At this point, the patient suddenly lost blood pressure and became hemodynamically unstable.  I palpated the carotid and no pulse was felt.  As it was apparent chest compressions would be required, retractors were removed and the skin was stapled shut.  Several rounds of chest compressions and vasoactive medications were required to maintain a perfusing blood pressure. Anesthesia service replaced the A-line and central line was also placed.  Transesophageal echo performed revealed dilated right atrium and internal jugular, so suspicion was large pulmonary embolus.  Patient was then transferred to the ICU in critical condition on epi, vasopressin and phenylephrine gtts.

## 2024-07-17 NOTE — Progress Notes (Signed)
 I had a long discussion with the patient and son.  He has 0/5 strength in lower extremities , 4-/5 hand grip and finger abduction b/l but otherwise full strength in upper extremities.  Sensory level at T10.  He has had several weeks of progressive neurologic decline with burning pain in his legs and feet as well as poor balance, several falls 5 days ago culminating in paraplegia several days ago.  There is a hairline fracture of the T1 body, mild PLC edema without disruption.  He has mild neck pain.  There is a focal left C7-T1 disc osteophyte contributing to mild stenosis, possible tiny epidural hematoma.  His spine fracture is a stable fracture that should heal with collar immobolization.  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  I discussed with the son and patient the option of C7-T1 decompression and fusion via ACDF, possibly PCDF.  I discussed that surgery would not reverse his spinal cord injury, and his likelihood of regaining functional strength in his legs is very poor, but may incrementally improve his ultimate underlying outcome if his CSM is contributing to this.  Risks, benefits, alternatives, and expected convalescence were discussed with the patient.  Risks discussed included, but were not limited to bleeding, pain, infection, dysphagia, dysphonia, scar, spinal fluid leak, neurologic deficit, instability, pseudoarthrosis, adjacent segment disease, damage to nearby organs, and death.  I discussed possible need of going posterior if anterior access does not appear feasible or safe intraoperatively.  Informed consent was obtained.

## 2024-07-18 ENCOUNTER — Encounter (HOSPITAL_COMMUNITY): Payer: Self-pay | Admitting: Anesthesiology

## 2024-07-18 ENCOUNTER — Inpatient Hospital Stay (HOSPITAL_COMMUNITY)

## 2024-07-18 ENCOUNTER — Encounter (HOSPITAL_COMMUNITY): Admission: EM | Disposition: E | Payer: Self-pay | Source: Home / Self Care

## 2024-07-18 DIAGNOSIS — R579 Shock, unspecified: Secondary | ICD-10-CM

## 2024-07-18 DIAGNOSIS — R748 Abnormal levels of other serum enzymes: Secondary | ICD-10-CM

## 2024-07-18 DIAGNOSIS — R509 Fever, unspecified: Secondary | ICD-10-CM

## 2024-07-18 DIAGNOSIS — I469 Cardiac arrest, cause unspecified: Secondary | ICD-10-CM | POA: Diagnosis not present

## 2024-07-18 DIAGNOSIS — R531 Weakness: Secondary | ICD-10-CM

## 2024-07-18 DIAGNOSIS — S2242XA Multiple fractures of ribs, left side, initial encounter for closed fracture: Secondary | ICD-10-CM

## 2024-07-18 DIAGNOSIS — S2231XA Fracture of one rib, right side, initial encounter for closed fracture: Secondary | ICD-10-CM

## 2024-07-18 DIAGNOSIS — Z9289 Personal history of other medical treatment: Secondary | ICD-10-CM

## 2024-07-18 HISTORY — PX: IR ANGIOGRAM SELECTIVE EACH ADDITIONAL VESSEL: IMG667

## 2024-07-18 HISTORY — PX: IR ANGIOGRAM PULMONARY BILATERAL SELECTIVE: IMG664

## 2024-07-18 HISTORY — PX: IR THROMBECT PRIM MECH ADD (INCLU) MOD SED: IMG2298

## 2024-07-18 LAB — POCT I-STAT 7, (LYTES, BLD GAS, ICA,H+H)
Acid-Base Excess: 0 mmol/L (ref 0.0–2.0)
Acid-Base Excess: 2 mmol/L (ref 0.0–2.0)
Acid-Base Excess: 0 mmol/L (ref 0.0–2.0)
Acid-base deficit: 2 mmol/L (ref 0.0–2.0)
Acid-base deficit: 1 mmol/L (ref 0.0–2.0)
Bicarbonate: 21.1 mmol/L (ref 20.0–28.0)
Bicarbonate: 20 mmol/L (ref 20.0–28.0)
Bicarbonate: 21.4 mmol/L (ref 20.0–28.0)
Bicarbonate: 23.5 mmol/L (ref 20.0–28.0)
Bicarbonate: 23.4 mmol/L (ref 20.0–28.0)
Calcium, Ion: 1.27 mmol/L (ref 1.15–1.40)
Calcium, Ion: 1.18 mmol/L (ref 1.15–1.40)
Calcium, Ion: 1.17 mmol/L (ref 1.15–1.40)
Calcium, Ion: 1.16 mmol/L (ref 1.15–1.40)
Calcium, Ion: 1.19 mmol/L (ref 1.15–1.40)
HCT: 35 % — ABNORMAL LOW (ref 39.0–52.0)
HCT: 28 % — ABNORMAL LOW (ref 39.0–52.0)
HCT: 24 % — ABNORMAL LOW (ref 39.0–52.0)
HCT: 26 % — ABNORMAL LOW (ref 39.0–52.0)
HCT: 25 % — ABNORMAL LOW (ref 39.0–52.0)
Hemoglobin: 11.9 g/dL — ABNORMAL LOW (ref 13.0–17.0)
Hemoglobin: 9.5 g/dL — ABNORMAL LOW (ref 13.0–17.0)
Hemoglobin: 8.2 g/dL — ABNORMAL LOW (ref 13.0–17.0)
Hemoglobin: 8.8 g/dL — ABNORMAL LOW (ref 13.0–17.0)
Hemoglobin: 8.5 g/dL — ABNORMAL LOW (ref 13.0–17.0)
O2 Saturation: 99 %
O2 Saturation: 100 %
O2 Saturation: 100 %
O2 Saturation: 100 %
O2 Saturation: 100 %
Patient temperature: 99
Patient temperature: 34.3
Patient temperature: 34.3
Patient temperature: 33.3
Patient temperature: 35
Potassium: 3.9 mmol/L (ref 3.5–5.1)
Potassium: 3.1 mmol/L — ABNORMAL LOW (ref 3.5–5.1)
Potassium: 3 mmol/L — ABNORMAL LOW (ref 3.5–5.1)
Potassium: 3 mmol/L — ABNORMAL LOW (ref 3.5–5.1)
Potassium: 3.2 mmol/L — ABNORMAL LOW (ref 3.5–5.1)
Sodium: 136 mmol/L (ref 135–145)
Sodium: 137 mmol/L (ref 135–145)
Sodium: 137 mmol/L (ref 135–145)
Sodium: 137 mmol/L (ref 135–145)
Sodium: 136 mmol/L (ref 135–145)
TCO2: 22 mmol/L (ref 22–32)
TCO2: 21 mmol/L — ABNORMAL LOW (ref 22–32)
TCO2: 22 mmol/L (ref 22–32)
TCO2: 24 mmol/L (ref 22–32)
TCO2: 24 mmol/L (ref 22–32)
pCO2 arterial: 30.8 mmHg — ABNORMAL LOW (ref 32–48)
pCO2 arterial: 18.4 mmHg — CL (ref 32–48)
pCO2 arterial: 19.7 mmHg — CL (ref 32–48)
pCO2 arterial: 22.1 mmHg — ABNORMAL LOW (ref 32–48)
pCO2 arterial: 28.8 mmHg — ABNORMAL LOW (ref 32–48)
pH, Arterial: 7.445 (ref 7.35–7.45)
pH, Arterial: 7.636 (ref 7.35–7.45)
pH, Arterial: 7.636 (ref 7.35–7.45)
pH, Arterial: 7.622 (ref 7.35–7.45)
pH, Arterial: 7.51 — ABNORMAL HIGH (ref 7.35–7.45)
pO2, Arterial: 123 mmHg — ABNORMAL HIGH (ref 83–108)
pO2, Arterial: 158 mmHg — ABNORMAL HIGH (ref 83–108)
pO2, Arterial: 130 mmHg — ABNORMAL HIGH (ref 83–108)
pO2, Arterial: 124 mmHg — ABNORMAL HIGH (ref 83–108)
pO2, Arterial: 147 mmHg — ABNORMAL HIGH (ref 83–108)

## 2024-07-18 LAB — CBC
HCT: 36.8 % — ABNORMAL LOW (ref 39.0–52.0)
HCT: 29.6 % — ABNORMAL LOW (ref 39.0–52.0)
Hemoglobin: 12.9 g/dL — ABNORMAL LOW (ref 13.0–17.0)
Hemoglobin: 10.5 g/dL — ABNORMAL LOW (ref 13.0–17.0)
MCH: 31.9 pg (ref 26.0–34.0)
MCH: 31.7 pg (ref 26.0–34.0)
MCHC: 35.1 g/dL (ref 30.0–36.0)
MCHC: 35.5 g/dL (ref 30.0–36.0)
MCV: 90.9 fL (ref 80.0–100.0)
MCV: 89.4 fL (ref 80.0–100.0)
Platelets: 199 K/uL (ref 150–400)
Platelets: 125 K/uL — ABNORMAL LOW (ref 150–400)
RBC: 4.05 MIL/uL — ABNORMAL LOW (ref 4.22–5.81)
RBC: 3.31 MIL/uL — ABNORMAL LOW (ref 4.22–5.81)
RDW: 11.3 % — ABNORMAL LOW (ref 11.5–15.5)
RDW: 11.3 % — ABNORMAL LOW (ref 11.5–15.5)
WBC: 16.1 K/uL — ABNORMAL HIGH (ref 4.0–10.5)
WBC: 16.4 K/uL — ABNORMAL HIGH (ref 4.0–10.5)
nRBC: 0 % (ref 0.0–0.2)
nRBC: 0 % (ref 0.0–0.2)

## 2024-07-18 LAB — URINALYSIS, ROUTINE W REFLEX MICROSCOPIC
Bilirubin Urine: NEGATIVE
Glucose, UA: >=500 mg/dL — AB
Ketones, ur: NEGATIVE mg/dL
Leukocytes,Ua: NEGATIVE
Nitrite: NEGATIVE
Protein, ur: 100 mg/dL — AB
RBC / HPF: >50 RBC/hpf (ref 0–5)
Specific Gravity, Urine: 1.026 (ref 1.005–1.030)
pH: 5 (ref 5.0–8.0)

## 2024-07-18 LAB — BASIC METABOLIC PANEL WITH GFR
Anion gap: 15 (ref 5–15)
Anion gap: 16 — ABNORMAL HIGH (ref 5–15)
BUN: 41 mg/dL — ABNORMAL HIGH (ref 8–23)
BUN: 50 mg/dL — ABNORMAL HIGH (ref 8–23)
CO2: 19 mmol/L — ABNORMAL LOW (ref 22–32)
CO2: 19 mmol/L — ABNORMAL LOW (ref 22–32)
Calcium: 8.6 mg/dL — ABNORMAL LOW (ref 8.9–10.3)
Calcium: 8.3 mg/dL — ABNORMAL LOW (ref 8.9–10.3)
Chloride: 100 mmol/L (ref 98–111)
Chloride: 103 mmol/L (ref 98–111)
Creatinine, Ser: 2.9 mg/dL — ABNORMAL HIGH (ref 0.61–1.24)
Creatinine, Ser: 3.69 mg/dL — ABNORMAL HIGH (ref 0.61–1.24)
GFR, Estimated: 22 mL/min — ABNORMAL LOW (ref >60)
GFR, Estimated: 16 mL/min — ABNORMAL LOW (ref >60)
Glucose, Bld: 372 mg/dL — ABNORMAL HIGH (ref 70–99)
Glucose, Bld: 144 mg/dL — ABNORMAL HIGH (ref 70–99)
Potassium: 3.7 mmol/L (ref 3.5–5.1)
Potassium: 3.2 mmol/L — ABNORMAL LOW (ref 3.5–5.1)
Sodium: 134 mmol/L — ABNORMAL LOW (ref 135–145)
Sodium: 138 mmol/L (ref 135–145)

## 2024-07-18 LAB — ABO/RH: ABO/RH(D): O POS

## 2024-07-18 LAB — LACTIC ACID, PLASMA
Lactic Acid, Venous: 4.6 mmol/L (ref 0.5–1.9)
Lactic Acid, Venous: 3.2 mmol/L (ref 0.5–1.9)

## 2024-07-18 LAB — RESPIRATORY PANEL BY PCR

## 2024-07-18 LAB — GLUCOSE, CAPILLARY
Glucose-Capillary: 292 mg/dL — ABNORMAL HIGH (ref 70–99)
Glucose-Capillary: 293 mg/dL — ABNORMAL HIGH (ref 70–99)
Glucose-Capillary: 329 mg/dL — ABNORMAL HIGH (ref 70–99)
Glucose-Capillary: 413 mg/dL — ABNORMAL HIGH (ref 70–99)
Glucose-Capillary: 261 mg/dL — ABNORMAL HIGH (ref 70–99)
Glucose-Capillary: 237 mg/dL — ABNORMAL HIGH (ref 70–99)
Glucose-Capillary: 176 mg/dL — ABNORMAL HIGH (ref 70–99)
Glucose-Capillary: 138 mg/dL — ABNORMAL HIGH (ref 70–99)
Glucose-Capillary: 133 mg/dL — ABNORMAL HIGH (ref 70–99)
Glucose-Capillary: 129 mg/dL — ABNORMAL HIGH (ref 70–99)
Glucose-Capillary: 124 mg/dL — ABNORMAL HIGH (ref 70–99)
Glucose-Capillary: 109 mg/dL — ABNORMAL HIGH (ref 70–99)
Glucose-Capillary: 126 mg/dL — ABNORMAL HIGH (ref 70–99)
Glucose-Capillary: 157 mg/dL — ABNORMAL HIGH (ref 70–99)
Glucose-Capillary: 120 mg/dL — ABNORMAL HIGH (ref 70–99)
Glucose-Capillary: 109 mg/dL — ABNORMAL HIGH (ref 70–99)
Glucose-Capillary: 140 mg/dL — ABNORMAL HIGH (ref 70–99)
Glucose-Capillary: 115 mg/dL — ABNORMAL HIGH (ref 70–99)
Glucose-Capillary: 136 mg/dL — ABNORMAL HIGH (ref 70–99)

## 2024-07-18 LAB — POCT ACTIVATED CLOTTING TIME: Activated Clotting Time: 233 s

## 2024-07-18 LAB — HEPARIN LEVEL (UNFRACTIONATED)
Heparin Unfractionated: 0.47 [IU]/mL (ref 0.30–0.70)
Heparin Unfractionated: 0.83 [IU]/mL — ABNORMAL HIGH (ref 0.30–0.70)
Heparin Unfractionated: 1.07 [IU]/mL — ABNORMAL HIGH (ref 0.30–0.70)
Heparin Unfractionated: >1.1 [IU]/mL — ABNORMAL HIGH (ref 0.30–0.70)

## 2024-07-18 LAB — MAGNESIUM
Magnesium: 1.8 mg/dL (ref 1.7–2.4)
Magnesium: 1.9 mg/dL (ref 1.7–2.4)

## 2024-07-18 LAB — APTT
aPTT: >200 s (ref 24–36)
aPTT: >200 s (ref 24–36)

## 2024-07-18 LAB — HEPATIC FUNCTION PANEL
ALT: 978 U/L — ABNORMAL HIGH (ref 0–44)
AST: 1476 U/L — ABNORMAL HIGH (ref 15–41)
Albumin: 2.1 g/dL — ABNORMAL LOW (ref 3.5–5.0)
Alkaline Phosphatase: 58 U/L (ref 38–126)
Bilirubin, Direct: 0.3 mg/dL — ABNORMAL HIGH (ref 0.0–0.2)
Indirect Bilirubin: 1 mg/dL — ABNORMAL HIGH (ref 0.3–0.9)
Total Bilirubin: 1.3 mg/dL — ABNORMAL HIGH (ref 0.0–1.2)
Total Protein: 4.3 g/dL — ABNORMAL LOW (ref 6.5–8.1)

## 2024-07-18 LAB — LIPASE, BLOOD: Lipase: 29 U/L (ref 11–51)

## 2024-07-18 SURGERY — RADIOLOGY WITH ANESTHESIA
Anesthesia: General

## 2024-07-18 MED ORDER — LACTATED RINGERS IV BOLUS
500.0000 mL | Freq: Once | INTRAVENOUS | Status: AC
  Administered 2024-07-18: 500 mL via INTRAVENOUS

## 2024-07-18 MED ORDER — LIDOCAINE HCL 1 % IJ SOLN
20.0000 mL | Freq: Once | INTRAMUSCULAR | Status: DC
Start: 2024-07-18 — End: 2024-07-20
  Filled 2024-07-18: qty 20

## 2024-07-18 MED ORDER — POLYVINYL ALCOHOL 1.4 % OP SOLN
1.0000 [drp] | OPHTHALMIC | Status: DC | PRN
  Administered 2024-07-18 – 2024-07-24 (×5): 1 [drp] via OPHTHALMIC
  Filled 2024-07-18 (×2): qty 15

## 2024-07-18 MED ORDER — HEPARIN SODIUM (PORCINE) 1000 UNIT/ML IJ SOLN
INTRAMUSCULAR | Status: AC
  Filled 2024-07-18: qty 10

## 2024-07-18 MED ORDER — PANTOPRAZOLE SODIUM 40 MG IV SOLR
40.0000 mg | INTRAVENOUS | Status: DC
  Administered 2024-07-18 – 2024-07-20 (×3): 40 mg via INTRAVENOUS
  Filled 2024-07-18 (×3): qty 10

## 2024-07-18 MED ORDER — INSULIN REGULAR(HUMAN) IN NACL 100-0.9 UT/100ML-% IV SOLN
INTRAVENOUS | Status: DC
  Administered 2024-07-18: 9.5 [IU]/h via INTRAVENOUS
  Filled 2024-07-18 (×2): qty 100

## 2024-07-18 MED ORDER — POTASSIUM CHLORIDE 10 MEQ/50ML IV SOLN
10.0000 meq | INTRAVENOUS | Status: AC
  Administered 2024-07-18 (×2): 10 meq via INTRAVENOUS
  Filled 2024-07-18: qty 50

## 2024-07-18 MED ORDER — NOREPINEPHRINE 16 MG/250ML-% IV SOLN
0.0000 ug/min | INTRAVENOUS | Status: DC
  Administered 2024-07-18: 16 ug/min via INTRAVENOUS
  Administered 2024-07-20: 6 ug/min via INTRAVENOUS
  Filled 2024-07-18 (×3): qty 250

## 2024-07-18 MED ORDER — PROPOFOL 1000 MG/100ML IV EMUL
0.0000 ug/kg/min | INTRAVENOUS | Status: DC
  Administered 2024-07-18: 5 ug/kg/min via INTRAVENOUS
  Administered 2024-07-18: 10 ug/kg/min via INTRAVENOUS
  Administered 2024-07-19: 15 ug/kg/min via INTRAVENOUS
  Filled 2024-07-18 (×2): qty 100
  Filled 2024-07-18: qty 200

## 2024-07-18 MED ORDER — SODIUM CHLORIDE 0.9 % IV SOLN
2.0000 g | INTRAVENOUS | Status: DC
  Administered 2024-07-18 – 2024-07-20 (×3): 2 g via INTRAVENOUS
  Filled 2024-07-18 (×3): qty 12.5

## 2024-07-18 MED ORDER — DEXTROSE 50 % IV SOLN: 0.0000 mL | INTRAVENOUS | Status: DC | PRN

## 2024-07-18 MED ORDER — SODIUM CHLORIDE 0.9 % IV SOLN: INTRAVENOUS | Status: AC | PRN

## 2024-07-18 MED ORDER — IOHEXOL 300 MG/ML  SOLN: 150.0000 mL | Freq: Once | INTRAMUSCULAR | Status: DC | PRN

## 2024-07-18 MED ORDER — POLYETHYLENE GLYCOL 3350 17 G PO PACK
17.0000 g | PACK | Freq: Every day | ORAL | Status: DC
  Administered 2024-07-18 – 2024-07-19 (×2): 17 g
  Filled 2024-07-18 (×4): qty 1

## 2024-07-18 MED ORDER — HEPARIN SODIUM (PORCINE) 1000 UNIT/ML IJ SOLN
INTRAMUSCULAR | Status: AC | PRN
  Administered 2024-07-18: 5000 [IU] via INTRAVENOUS

## 2024-07-18 MED ORDER — HEPARIN (PORCINE) 25000 UT/250ML-% IV SOLN
1250.0000 [IU]/h | INTRAVENOUS | Status: DC
  Administered 2024-07-19: 900 [IU]/h via INTRAVENOUS
  Administered 2024-07-21: 1000 [IU]/h via INTRAVENOUS
  Administered 2024-07-22: 1100 [IU]/h via INTRAVENOUS
  Administered 2024-07-22 – 2024-07-30 (×8): 1200 [IU]/h via INTRAVENOUS
  Administered 2024-07-31: 1250 [IU]/h via INTRAVENOUS
  Filled 2024-07-18 (×14): qty 250

## 2024-07-18 MED ORDER — LIDOCAINE HCL 1 % IJ SOLN
INTRAMUSCULAR | Status: AC
Start: 2024-07-18 — End: 2024-07-18
  Filled 2024-07-18: qty 20

## 2024-07-18 MED ORDER — SENNOSIDES-DOCUSATE SODIUM 8.6-50 MG PO TABS
2.0000 | ORAL_TABLET | Freq: Every day | ORAL | Status: DC
  Administered 2024-07-18 – 2024-07-30 (×11): 2
  Filled 2024-07-18 (×11): qty 2

## 2024-07-18 MED ORDER — ATROPINE SULFATE 1 MG/10ML IJ SOSY
PREFILLED_SYRINGE | INTRAMUSCULAR | Status: AC
  Filled 2024-07-18: qty 10

## 2024-07-18 MED ORDER — HEPARIN (PORCINE) 25000 UT/250ML-% IV SOLN
900.0000 [IU]/h | INTRAVENOUS | Status: DC
  Administered 2024-07-18: 900 [IU]/h via INTRAVENOUS
  Filled 2024-07-18: qty 250

## 2024-07-18 NOTE — Progress Notes (Addendum)
 eLink Physician-Brief Progress Note Patient Name: Lawrence [name] DOB: [birth year] MRN: [id number]   Date of Service  07/18/2024  HPI/Events of Note  Patient remains hyperglycemic despite being on subcutaneous insulin  with long-acting insulin .  Blood glucose is 413.  eICU Interventions  Will start patient on insulin  drip for better glycemic control.     Intervention Category Major Interventions: Hyperglycemia - active titration of insulin  therapy  Jerilynn Berg 07/18/2024, 12:15 AM  Addendum: Lactate is down from 8.6-4.6. Continue current management.  Patient continues to improve.  Jerilynn Berg 07/18/2024, 5:32 AM

## 2024-07-18 NOTE — Consult Note (Signed)
 Chief Complaint: Bilateral Hilar PE. Request is for bilateral pulmonary embolism thrombectomy  Referring Physician(s): ICU  Supervising Physician: Philip Cornet  Patient Status: Lawrence [name] - In-pt  History of Present Illness: Lawrence [name] is a 78 y.o. male inpatient. History of HTN, DM,  Presented to the ED at [facility] on 8.26.25 after suffering a fall in the bathroom hitting his head.  Patient was found by his son alert but endorsing head and neck pain and unable to move his lower extremities.  Found to have  cord signal changes C7 to T1 cord injury and bilateral proximal hilar pulmonary embolism with right heart strain. Patient was transferred to [name] for further evaluation and possible treatment. Patient was taken to the OR for an anterior cervical decompression discectomy but procedure was aborted due to PEA cardiac arrest. ROSC was achieved and the patient was transferred to the ICU.  [name] stay complicated by a second PEA cardiac arrest that occurred on 10.28.25 in the ICU.  ROSC was achieved after 2 minutes. Team is requesting a PE thrombectomy.   Discussion with the patient and family regarding wishes.   Code Status: Full Code   Unable to assess ROS due to patient condition.   CT chest from 10.26.25 reads Extensive pulmonary artery thrombus bilaterally with evidence of right heart strain. Patient is on a heparin  gtt. Allergies include PCN. Patient has been NPO.  Return precautions and treatment recommendations and follow-up discussed with the patient 's son and wife.  Both who are agreeable with the plan.    Past Medical History:  Diagnosis Date   Arthritis    Diabetes mellitus without complication (HCC)    GERD (gastroesophageal reflux disease)    Hypertension    Sleep apnea     Past Surgical History:  Procedure Laterality Date   APPENDECTOMY     78 yo   BACK SURGERY  1975   lumbar diskectomy   done [facility]   COLONOSCOPY N/A 02/21/2015   Procedure:  COLONOSCOPY;  Surgeon: Claudis RAYMOND Rivet, MD;  Location: [facility] ENDO SUITE;  Service: Endoscopy;  Laterality: N/A;  1025   COLONOSCOPY  11/13/2021   COLONOSCOPY WITH PROPOFOL  N/A 11/13/2021   Procedure: COLONOSCOPY WITH PROPOFOL ;  Surgeon: Rivet Claudis RAYMOND, MD;  Location: [facility] ENDO SUITE;  Service: Endoscopy;  Laterality: N/A;  155   CYSTOSCOPY WITH URETHRAL DILATATION N/A 12/09/2022   Procedure: CYSTOSCOPY WITH URETHRAL DILATATION;  Surgeon: Watt Rush, MD;  Location: [facility] ORS;  Service: Urology;  Laterality: N/A;   EYE SURGERY Bilateral    Lasik surgery   LEFT HEART CATH AND CORONARY ANGIOGRAPHY N/A 12/23/2020   Procedure: LEFT HEART CATH AND CORONARY ANGIOGRAPHY;  Surgeon: Burnard Debby LABOR, MD;  Location: [facility] INVASIVE CV LAB;  Service: Cardiovascular;  Laterality: N/A;   NECK SURGERY  1999   Work injury, posterior fusion   POLYPECTOMY  11/13/2021   Procedure: POLYPECTOMY INTESTINAL;  Surgeon: Rivet Claudis RAYMOND, MD;  Location: [facility];  Service: Endoscopy;;   SHOULDER SURGERY Bilateral 2000   rotoator cuff repair   done at [facility]   TONSILLECTOMY     TRANSURETHRAL INCISION OF PROSTATE N/A 12/09/2022   Procedure: TRANSURETHRAL RESECTION OF PROSTATE;  Surgeon: Watt Rush, MD;  Location: [facility] ORS;  Service: Urology;  Laterality: N/A;  1 HR FOR CASE    Allergies: Penicillins and Lucentis [ranibizumab]  Medications: Prior to Admission medications   Medication Sig Start Date End Date Taking? Authorizing Provider  Web Designer  600 MG CAPS Take 600 mg by mouth daily.   Yes [provider]  aspirin  EC 81 MG tablet Take 1 tablet (81 mg total) by mouth every other day. 11/14/21  Yes Rehman, Claudis PENNER, MD  Barberry-Oreg Grape-Goldenseal (BERBERINE COMPLEX PO) Take 1 capsule by mouth daily.   Yes [provider]  cholecalciferol (VITAMIN D3) 25 MCG (1000 UNIT) tablet Take 1,000 Units by mouth daily.   Yes [provider]  Cinnamon 500 MG capsule Take 500 mg by mouth  daily.   Yes [provider]  diphenhydrAMINE HCl (NERVINE PO) Take 1 tablet by mouth daily. Patient states that he takes one a day   Yes [provider]  empagliflozin (JARDIANCE) 10 MG TABS tablet Take 10 mg by mouth daily.   Yes [provider]  ferrous sulfate 325 (65 FE) MG tablet Take 325 mg by mouth daily with breakfast.   Yes [provider]  hydrochlorothiazide  (HYDRODIURIL ) 25 MG tablet Take 25 mg by mouth daily. 07/23/21  Yes [provider]  insulin  degludec (TRESIBA  FLEXTOUCH) 100 UNIT/ML FlexTouch Pen Inject 12 Units into the skin at bedtime. Patient taking differently: Inject 28 Units into the skin 2 (two) times daily. 10/07/22  Yes Reardon, Benton PARAS, NP  Multiple Vitamins-Minerals (OCUVITE ADULT 50+ PO) Take 1 tablet by mouth daily.   Yes [provider]  OVER THE COUNTER MEDICATION Take 1 capsule by mouth daily. Prostagenix   Yes [provider]  OVER THE COUNTER MEDICATION Take 1 capsule by mouth daily. Diabacore (Diabetic Supplement)   Yes [provider]  OVER THE COUNTER MEDICATION Take 1 tablet by mouth in the morning and at bedtime. Glucoceil   Yes [provider]  tadalafil  (CIALIS ) 5 MG tablet Take 1 tablet (5 mg total) by mouth daily as needed for erectile dysfunction. 12/06/20  Yes Watt Rush, MD  vitamin E 180 MG (400 UNITS) capsule Take 400 Units by mouth daily.   Yes [provider]  Vitamin Mixture (ESTER-C PO) Take 1,000 mg by mouth daily.   Yes [provider]     History reviewed. No pertinent family history.  Social History   Socioeconomic History   Marital status: Married    Spouse name: Not on file   Number of children: 3   Years of education: Not on file   Highest education level: Not on file  Occupational History   Occupation: retired  Tobacco Use   Smoking status: Never   Smokeless tobacco: Never  Vaping Use   Vaping status: Never Used  Substance  and Sexual Activity   Alcohol use: No   Drug use: No   Sexual activity: Not Currently  Other Topics Concern   Not on file  Social History Narrative   Not on file   Social Drivers of Health   Financial Resource Strain: Not on file  Food Insecurity: No Food Insecurity (07/17/2024)   Hunger Vital Sign    Worried About Running Out of Food in the Last Year: Never true    Ran Out of Food in the Last Year: Never true  Transportation Needs: No Transportation Needs (07/17/2024)   PRAPARE - Administrator, Civil Service (Medical): No    Lack of Transportation (Non-Medical): No  Physical Activity: Not on file  Stress: Not on file  Social Connections: Moderately Isolated (07/17/2024)   Social Connection and Isolation Panel    Frequency of Communication with Friends and Family: More than three  times a week    Frequency of Social Gatherings with Friends and Family: Once a week    Attends Religious Services: Patient unable to answer    Active Member of Clubs or Organizations: No    Attends Banker Meetings: Never    Marital Status: Married     Review of Systems: A 12 point ROS discussed and pertinent positives are indicated in the HPI above.  All other systems are negative.  Review of Systems  Unable to perform ROS: Acuity of condition    Vital Signs: BP (!) 144/73   Pulse 63   Temp (!) 97 F (36.1 C) (Rectal)   Resp (!) 22   Ht 6' 1 (1.854 m)   Wt 175 lb 4.3 oz (79.5 kg)   SpO2 100%   BMI 23.12 kg/m   Advance Care Plan: The advanced care plan/surrogate decision maker was discussed at the time of visit and documented in the medical record.    Physical Exam Vitals and nursing note reviewed.  Constitutional:      Appearance: He is well-developed. He is ill-appearing.  HENT:     Head: Normocephalic.  Neck:     Comments: C-collar in place Cardiovascular:     Rate and Rhythm: Normal rate.  Pulmonary:     Comments: Intubated and on the  vent Neurological:     Comments: Unable to assess     Imaging:   Labs:  CBC: Recent Labs    07/17/24 0425 07/17/24 1352 07/17/24 1451 07/17/24 1453 07/17/24 2140 07/17/24 2145 07/18/24 0417 07/18/24 0425  WBC 9.7  --  12.0*  --  22.4*  --  16.1*  --   HGB 12.4*   < > 10.5*   < > 13.4 10.9* 12.9* 11.9*  HCT 35.6*   < > 31.5*   < > 38.5* 32.0* 36.8* 35.0*  PLT 208  --  86*  --  211  --  199  --    < > = values in this interval not displayed.    COAGS: Recent Labs    07/17/24 0425 07/17/24 1451 07/17/24 2140  INR 1.0 1.7* 1.7*  APTT  --  49*  --     BMP: Recent Labs    07/17/24 0425 07/17/24 1352 07/17/24 1451 07/17/24 1453 07/17/24 2140 07/17/24 2145 07/18/24 0417 07/18/24 0425  NA 135   < > 137   < > 138 138 134* 136  K 3.6   < > 3.4*   < > 4.1 3.5 3.7 3.9  CL 98  --  101  --  98  --  100  --   CO2 24  --  18*  --  19*  --  19*  --   GLUCOSE 180*  --  286*  --  393*  --  372*  --   BUN 22  --  25*  --  35*  --  41*  --   CALCIUM 9.0  --  7.8*  --  9.3  --  8.6*  --   CREATININE 0.99  --  1.50*  --  2.37*  --  2.90*  --   GFRNONAA >60  --  48*  --  28*  --  22*  --    < > = values in this interval not displayed.    LIVER FUNCTION TESTS: Recent Labs    07/16/24 1959 07/18/24 0417  BILITOT 0.7 1.3*  AST 46* 1,476*  ALT 20 978*  ALKPHOS 103 58  PROT 7.3 4.3*  ALBUMIN 4.3 2.1*    TUMOR MARKERS: No results for input(s): AFPTM, CEA, CA199, CHROMGRNA in the last 8760 hours.  Assessment and Plan:  78 y.o. male inpatient. History of HTN, DM,  Presented to the ED at [facility] on 8.26.25 after suffering a fall in the bathroom hitting his head.  Patient was found by his son alert but endorsing head and neck pain and unable to move his lower extremities.  Found to have  cord signal changes C7 to T1 cord injury and bilateral proximal hilar pulmonary embolism with right heart strain. Patient was taken to the OR for an anterior cervical decompression  discectomy but procedure was aborted due to PEA cardiac arrest. ROSC was achieved and the patient was transferred to the ICU.  [name] stay complicated by a second PEA cardiac arrest that occurred on 10.28.25 in the ICU.  ROSC was achieved after 2 minutes. Team is requesting a PE thrombectomy.   PLAN: IR Image Guided Bilateral Pulmonary Embolism Thrombectomy  The Risks and benefits of embolization were discussed with the patient including, but not limited to bleeding, infection, vascular injury, post operative pain, or contrast induced renal failure.  This procedure involves the use of X-rays and because of the nature of the planned procedure, it is possible that we will have prolonged use of X-ray fluoroscopy.  Potential radiation risks to you include (but are not limited to) the following: - A slightly elevated risk for cancer several years later in life. This risk is typically less than 0.5% percent. This risk is low in comparison to the normal incidence of human cancer, which is 33% for women and 50% for men according to the [employer]. - Radiation induced injury can include skin redness, resembling a rash, tissue breakdown / ulcers and hair loss (which can be temporary or permanent).   The likelihood of either of these occurring depends on the difficulty of the procedure and whether you are sensitive to radiation due to previous procedures, disease, or genetic conditions.   IF your procedure requires a prolonged use of radiation, you will be notified and given written instructions for further action.  It is your responsibility to monitor the irradiated area for the 2 weeks following the procedure and to notify your physician if you are concerned that you have suffered a radiation induced injury.    All of the patient's questions were answered, patient is agreeable to proceed. Consent signed and in chart.   Thank you for this interesting consult.  I greatly enjoyed meeting  Kalyan Barabas and look forward to participating in their care.  A copy of this report was sent to the requesting provider on this date.  Electronically Signed: Delon JAYSON Beagle, NP 07/18/2024, 2:18 PM   I spent a total of 40 Minutes   in face to face in clinical consultation, greater than 50% of which was counseling/coordinating care for PE thrombectomy

## 2024-07-18 NOTE — Progress Notes (Addendum)
 NAME:  Lawrence [name], MRN:  [id number], DOB:  [birth year], LOS: 1 ADMISSION DATE:  07/16/2024, CONSULTATION DATE: 07/17/2024 REFERRING MD:  Debby Carrier , CHIEF COMPLAINT: Status postcardiac arrest  History of Present Illness:  78 year old male with diabetes, hypertension, BPH status post TURP who presented after having a ground-level fall with recent onset of numbness in his lower extremity- complete loss of sensation to BLE & urinary incontinence after fall.      Neurosurgery was consulted, patient mental OR for partial anterior cervical discectomy C7-T1 but course was complicated with multiple time PEA cardiac arrest in OR, procedure was aborted, TEE was done which showed severe RV dysfunction and dilatation with moderate to severe LV dysfunction, patient was transferred to ICU for close monitoring.  In ICU patient blood pressure started dropping and he went into PEA cardiac arrest again, ROSC was achieved after 2 minutes.  Unfortunately due to surgery, it was recommended no TNK/TPA.  Due to tenuous status and concern for re-arrest it was decided to hold on suction thrombectomy at the time.   Pertinent  Medical History   Past Medical History:  Diagnosis Date   Arthritis    Diabetes mellitus without complication (HCC)    GERD (gastroesophageal reflux disease)    Hypertension    Sleep apnea      Significant Hospital Events: Including procedures, antibiotic start and stop dates in addition to other pertinent events   Admitted and PCCM consulted, coded multiple times in OR (PEA)  Interim History / Subjective:  Hemodynamics stable overnight, deeply sedated this am. Febrile and TTM started  Objective    Blood pressure 114/71, pulse (!) 115, temperature 99 F (37.2 C), temperature source Oral, resp. rate (!) 28, height 6' 1 (1.854 m), weight 79.5 kg, SpO2 99%.    Vent Mode: PRVC FiO2 (%):  [50 %-100 %] 50 % Set Rate:  [16 bmp-28 bmp] 28 bmp Vt Set:  [640 fO-359359 mL] 640  mL PEEP:  [5 cmH20-8 cmH20] 8 cmH20 Plateau Pressure:  [20 cmH20-25 cmH20] 24 cmH20   Intake/Output Summary (Last 24 hours) at 07/18/2024 0747 Last data filed at 07/18/2024 0700 Gross per 24 hour  Intake 4031.12 ml  Output 970 ml  Net 3061.12 ml   Filed Weights   07/16/24 1816 07/16/24 2239 07/18/24 0430  Weight: 79.4 kg 79.4 kg 79.5 kg    Examination: General: Crtitically ill-appearing male, orally intubated HEENT: [place]/AT, eyes anicteric.  ETT and OGT in place. Aspen collar in place Neuro: Sedated, not following commands. Eyes pinpoint, no mvement in extremities Chest: Coarse breath sounds, no wheezes or rhonchi, no secretions via ETT Heart: Regular rate and rhythm, no murmurs or gallops Abdomen: Soft, nondistended, bowel sounds present  Patient Lines/Drains/Airways Status     Active Line/Drains/Airways     Name Placement date Placement time Site Days   Peripheral IV 07/16/24 20 G 1 Left Antecubital 07/16/24  1956  Antecubital  1   Peripheral IV 07/17/24 18 G Right Forearm 07/17/24  1227  Forearm  less than 1   CVC Double Lumen 07/17/24 Right Internal jugular 07/17/24  --  -- less than 1   Urethral Catheter Jamie Aydelette RN Latex 16 Fr. 07/17/24  1230  Latex  less than 1   Airway 7.5 mm 07/17/24  1226  -- less than 1   Wound 07/17/24 1413 Surgical Closed Surgical Incision Neck 07/17/24  1413  Neck  less than 1         Resolved problem list  Assessment and Plan  Status post multiple times PEA cardiac arrest likely in the setting of massive PE Acute biventricular HFrEF  by bedside echo Shock, suspect mix cardiogenic/vasodilatory -- CTA Chest: 10/26: extensive pulmonary artery thrombus bilaterally with right heart strain, small pleural effusion --Lactate trending down to 4.6 this am (peak 11) Plan:  -- Formal echocardiogram ordered -- Systemic anticoagulation:  IV heparin  infusion -- Remains on epi, levo and vaso for MAP > 65 -- Repeat lactic this afternoon  --  Post arrest cooling protocol, reports of purposeful movements post surgery. Once LFTs completed - will start scheduled acetaminophen    Febrile, possible sepsis component --Febrile with leukocytosis Plan: -- Infectious work up: CTA noted to have bilateral chest infiltrates as well as concern for fat stranding around pancreas. -- Lipase added, UA pending, LFT's added, respiratory culture, blood culture. -- Adjusting Precedex/Versed  to propofol .  -- Antibiotics: MRSA nares with suspected source respiratory: Add Cefepime.   Acute respiratory failure with hypoxia and hypercapnia Rib fractures: T6 on right, T5/6 on left Mixed metabolic and respiratory acidosis --Intubated 10/26 --VAP bundle --Sedation with Precedex/Fentanyl /Versed . No reports of seizures.  Adjusting to Propofol /fentanyl  as goal sedation  Fall with C7-T1 spinal cord injury s/p aborted procedure due to arrest --Aspen collar per NSGY --anterior neck dressing with small amount of blood, unclear if this is from CVC placement. Monitor closely  Diabetes type 2 --Uncontrolled hyperglycemia, now on insulin  infusion. Continue today,  CBG goal 140-180  Acute kidney injury due to ischemic ATN -- Baseline creatinine: 1 -- Creatinine peak at 2.9 today, suspect multifactorial with shock/arrest/contrast. Foley in place and monitoring I/O. CVP pending.  -- Foley for strict I/O  Post arrest neurological exam -- Not yet completed due to hemodynamic instability.  CTH post arrest with no acute intracranial pathology.  Adjusting sedation as above and proceed with daily SATs as hemodynamic stability allows  No mIVF No electrolyte replacement NPO, if improving vasopressor requirements will start trickle feeds  Family updated at bedside.   Labs   CBC: Recent Labs  Lab 07/16/24 1959 07/17/24 0425 07/17/24 1352 07/17/24 1451 07/17/24 1453 07/17/24 1512 07/17/24 2140 07/17/24 2145 07/18/24 0417 07/18/24 0425  WBC 12.0* 9.7  --   12.0*  --   --  22.4*  --  16.1*  --   NEUTROABS 10.3*  --   --   --   --   --   --   --   --   --   HGB 14.7 12.4*   < > 10.5*   < > 9.2* 13.4 10.9* 12.9* 11.9*  HCT 40.6 35.6*   < > 31.5*   < > 27.0* 38.5* 32.0* 36.8* 35.0*  MCV 89.6 91.5  --  95.7  --   --  92.5  --  90.9  --   PLT 253 208  --  86*  --   --  211  --  199  --    < > = values in this interval not displayed.    Basic Metabolic Panel: Recent Labs  Lab 07/16/24 1959 07/17/24 0425 07/17/24 1352 07/17/24 1451 07/17/24 1453 07/17/24 1512 07/17/24 2140 07/17/24 2145 07/18/24 0417 07/18/24 0425  NA 132* 135   < > 137   < > 139 138 138 134* 136  K 3.7 3.6   < > 3.4*   < > 4.2 4.1 3.5 3.7 3.9  CL 91* 98  --  101  --   --  98  --  100  --   CO2 30 24  --  18*  --   --  19*  --  19*  --   GLUCOSE 361* 180*  --  286*  --   --  393*  --  372*  --   BUN 24* 22  --  25*  --   --  35*  --  41*  --   CREATININE 1.08 0.99  --  1.50*  --   --  2.37*  --  2.90*  --   CALCIUM 10.2 9.0  --  7.8*  --   --  9.3  --  8.6*  --   MG  --  2.2  --   --   --   --  2.0  --  1.8  --   PHOS  --  3.4  --   --   --   --   --   --   --   --    < > = values in this interval not displayed.   GFR: Estimated Creatinine Clearance: 24.4 mL/min (A) (by C-G formula based on SCr of 2.9 mg/dL (H)). Recent Labs  Lab 07/17/24 0425 07/17/24 1451 07/17/24 1459 07/17/24 1756 07/17/24 2140 07/18/24 0417  WBC 9.7 12.0*  --   --  22.4* 16.1*  LATICACIDVEN  --   --  11.6* 8.6*  --  4.6*    Liver Function Tests: Recent Labs  Lab 07/16/24 1959  AST 46*  ALT 20  ALKPHOS 103  BILITOT 0.7  PROT 7.3  ALBUMIN 4.3   No results for input(s): LIPASE, AMYLASE in the last 168 hours. No results for input(s): AMMONIA in the last 168 hours.  ABG    Component Value Date/Time   PHART 7.445 07/18/2024 0425   PCO2ART 30.8 (L) 07/18/2024 0425   PO2ART 123 (H) 07/18/2024 0425   HCO3 21.1 07/18/2024 0425   TCO2 22 07/18/2024 0425   ACIDBASEDEF 2.0  07/18/2024 0425   O2SAT 99 07/18/2024 0425     Coagulation Profile: Recent Labs  Lab 07/17/24 0425 07/17/24 1451 07/17/24 2140  INR 1.0 1.7* 1.7*    Cardiac Enzymes: Recent Labs  Lab 07/16/24 1959 07/17/24 0425  CKTOTAL 1,321* 938*    HbA1C: Hemoglobin A1C  Date/Time Value Ref Range Status  07/23/2021 12:00 AM 11.9  Final  10/27/2020 12:00 AM 9.3  Final   HbA1c, POC (controlled diabetic range)  Date/Time Value Ref Range Status  11/19/2021 11:44 AM 10.2 (A) 0.0 - 7.0 % Final   HbA1c POC (<> result, manual entry)  Date/Time Value Ref Range Status  03/18/2022 03:21 PM 10.8 4.0 - 5.6 % Final   Hgb A1c MFr Bld  Date/Time Value Ref Range Status  07/17/2024 04:25 AM 12.3 (H) 4.8 - 5.6 % Final    Comment:    (NOTE) Diagnosis of Diabetes The following HbA1c ranges recommended by the American Diabetes Association (ADA) may be used as an aid in the diagnosis of diabetes mellitus.  Hemoglobin             Suggested A1C NGSP%              Diagnosis  <5.7                   Non Diabetic  5.7-6.4                Pre-Diabetic  >6.4  Diabetic  <7.0                   Glycemic control for                       adults with diabetes.    12/02/2022 11:30 AM 11.9 (H) 4.8 - 5.6 % Final    Comment:    (NOTE)         Prediabetes: 5.7 - 6.4         Diabetes: >6.4         Glycemic control for adults with diabetes: <7.0     CBG: Recent Labs  Lab 07/17/24 2311 07/18/24 0035 07/18/24 0146 07/18/24 0250 07/18/24 0656  GLUCAP 413* 293* 329* 292* 261*    Review of Systems:   Unable to obtain as patient is intubated and sedated  Past Medical History:  He,  has a past medical history of Arthritis, Diabetes mellitus without complication (HCC), GERD (gastroesophageal reflux disease), Hypertension, and Sleep apnea.   Surgical History:   Past Surgical History:  Procedure Laterality Date   APPENDECTOMY     78 yo   BACK SURGERY  1975   lumbar diskectomy    done [facility]   COLONOSCOPY N/A 02/21/2015   Procedure: COLONOSCOPY;  Surgeon: Claudis RAYMOND Rivet, MD;  Location: [facility];  Service: Endoscopy;  Laterality: N/A;  1025   COLONOSCOPY  11/13/2021   COLONOSCOPY WITH PROPOFOL  N/A 11/13/2021   Procedure: COLONOSCOPY WITH PROPOFOL ;  Surgeon: Rivet Claudis RAYMOND, MD;  Location: [facility];  Service: Endoscopy;  Laterality: N/A;  155   CYSTOSCOPY WITH URETHRAL DILATATION N/A 12/09/2022   Procedure: CYSTOSCOPY WITH URETHRAL DILATATION;  Surgeon: Watt Rush, MD;  Location: [facility];  Service: Urology;  Laterality: N/A;   EYE SURGERY Bilateral    Lasik surgery   LEFT HEART CATH AND CORONARY ANGIOGRAPHY N/A 12/23/2020   Procedure: LEFT HEART CATH AND CORONARY ANGIOGRAPHY;  Surgeon: Burnard Debby LABOR, MD;  Location: [facility];  Service: Cardiovascular;  Laterality: N/A;   NECK SURGERY  1999   Work injury, posterior fusion   POLYPECTOMY  11/13/2021   Procedure: POLYPECTOMY INTESTINAL;  Surgeon: Rivet Claudis RAYMOND, MD;  Location: [facility];  Service: Endoscopy;;   SHOULDER SURGERY Bilateral 2000   rotoator cuff repair   done at [facility]   TONSILLECTOMY     TRANSURETHRAL INCISION OF PROSTATE N/A 12/09/2022   Procedure: TRANSURETHRAL RESECTION OF PROSTATE;  Surgeon: Watt Rush, MD;  Location: [facility];  Service: Urology;  Laterality: N/A;  1 HR FOR CASE     Social History:   reports that he has never smoked. He has never used smokeless tobacco. He reports that he does not drink alcohol and does not use drugs.   Family History:  His family history is not on file.   Allergies Allergies  Allergen Reactions   Penicillins Hives   Lucentis [Ranibizumab] Rash     Home Medications  Prior to Admission medications   Medication Sig Start Date End Date Taking? Authorizing Provider  Apple Cider Vinegar 600 MG CAPS Take 600 mg by mouth daily.   Yes [provider]  aspirin  EC 81 MG tablet Take 1 tablet (81 mg total) by mouth  every other day. 11/14/21  Yes Rehman, Claudis RAYMOND, MD  Barberry-Oreg Grape-Goldenseal (BERBERINE COMPLEX PO) Take 1 capsule by mouth daily.   Yes [provider]  cholecalciferol (VITAMIN D3)  25 MCG (1000 UNIT) tablet Take 1,000 Units by mouth daily.   Yes [provider]  Cinnamon 500 MG capsule Take 500 mg by mouth daily.   Yes [provider]  diphenhydrAMINE HCl (NERVINE PO) Take 1 tablet by mouth daily. Patient states that he takes one a day   Yes [provider]  empagliflozin (JARDIANCE) 10 MG TABS tablet Take 10 mg by mouth daily.   Yes [provider]  ferrous sulfate 325 (65 FE) MG tablet Take 325 mg by mouth daily with breakfast.   Yes [provider]  hydrochlorothiazide  (HYDRODIURIL ) 25 MG tablet Take 25 mg by mouth daily. 07/23/21  Yes [provider]  insulin  degludec (TRESIBA  FLEXTOUCH) 100 UNIT/ML FlexTouch Pen Inject 12 Units into the skin at bedtime. Patient taking differently: Inject 28 Units into the skin 2 (two) times daily. 10/07/22  Yes Reardon, Benton PARAS, NP  Multiple Vitamins-Minerals (OCUVITE ADULT 50+ PO) Take 1 tablet by mouth daily.   Yes [provider]  OVER THE COUNTER MEDICATION Take 1 capsule by mouth daily. Prostagenix   Yes [provider]  OVER THE COUNTER MEDICATION Take 1 capsule by mouth daily. Diabacore (Diabetic Supplement)   Yes [provider]  OVER THE COUNTER MEDICATION Take 1 tablet by mouth in the morning and at bedtime. Glucoceil   Yes [provider]  tadalafil  (CIALIS ) 5 MG tablet Take 1 tablet (5 mg total) by mouth daily as needed for erectile dysfunction. 12/06/20  Yes Watt Rush, MD  vitamin E 180 MG (400 UNITS) capsule Take 400 Units by mouth daily.   Yes [provider]  Vitamin Mixture (ESTER-C PO) Take 1,000 mg by mouth daily.   Yes [provider]     Critical care time:      CCT: 40 min    Lucie Irving, ACNP [place]  Pulmonary Critical Care Please [place] If no response to pager, please call [phone] until 7pm After 7pm, Please call E-link [phone]

## 2024-07-18 NOTE — Progress Notes (Signed)
 PHARMACY - ANTICOAGULATION CONSULT NOTE  Pharmacy Consult for heparin  Indication: pulmonary embolus  Allergies  Allergen Reactions   Penicillins Hives   Lucentis [Ranibizumab] Rash    Patient Measurements: Height: 6' 1 (185.4 cm) Weight: 79.5 kg (175 lb 4.3 oz) IBW/kg (Calculated) : 79.9 HEPARIN  DW (KG): 79.4  Vital Signs: Temp: 95.7 F (35.4 C) (10/27 2100) Temp Source: Rectal (10/27 1600) BP: 117/62 (10/27 2100) Pulse Rate: 84 (10/27 2100)  Labs: Recent Labs    07/16/24 1959 07/17/24 0425 07/17/24 1352 07/17/24 1451 07/17/24 1453 07/17/24 2140 07/17/24 2145 07/17/24 2353 07/18/24 0417 07/18/24 0425 07/18/24 0808 07/18/24 1612 07/18/24 1616 07/18/24 1707 07/18/24 1815 07/18/24 1949 07/18/24 2002  HGB 14.7 12.4*   < > 10.5*   < > 13.4   < >  --  12.9*   < >  --    < > 10.5* 8.2* 8.8* 8.5*  --   HCT 40.6 35.6*   < > 31.5*   < > 38.5*   < >  --  36.8*   < >  --    < > 29.6* 24.0* 26.0* 25.0*  --   PLT 253 208  --  86*  --  211  --   --  199  --   --   --  125*  --   --   --   --   APTT  --   --   --  49*  --   --   --   --   --   --   --   --  >200*  --   --   --  >200*  LABPROT  --  14.1  --  20.4*  --  20.9*  --   --   --   --   --   --   --   --   --   --   --   INR  --  1.0  --  1.7*  --  1.7*  --   --   --   --   --   --   --   --   --   --   --   HEPARINUNFRC  --   --    < > <0.10*  --   --   --  0.83*  --   --  1.07*  --   --   --   --   --  >1.10*  CREATININE 1.08 0.99  --  1.50*  --  2.37*  --   --  2.90*  --   --   --  3.69*  --   --   --   --   CKTOTAL 1,321* 938*  --   --   --   --   --   --   --   --   --   --   --   --   --   --   --    < > = values in this interval not displayed.    Estimated Creatinine Clearance: 19.2 mL/min (A) (by C-G formula based on SCr of 3.69 mg/dL (H)).   Assessment: JC is a 89 YOM admitted with concern for spinal cord injury. Patient taken to the OR for neurosurgery, procedure complicated by multiple rounds of CPR.  Patient transferred to CVICU for further management. Per MD, concern for pulmonary embolism. Patient not on anticoagulation prior to hospitalization. Pharmacy consulted for heparin  dosing.  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 S/p IR 10/27 for thrombectomy.  5000 unit heparin  bolus given in IR and gtt continued at 900 units/hr. aPTT >200 earlier this shift so heparin  was turned off. Repeat heparin  level >1.1 and aPTT >200 (these levels were drawn from art line). Heparin  remains off. Pt with AKI and anuric which likely contributing to decreased heparin  clearance. No overt bleeding noted. Hgb down to 8.5, plt down to 125.  Goal of Therapy:  Heparin  level 0.3-0.7 units/ml Monitor platelets by anticoagulation protocol: Yes   Plan:  Recheck heparin  level in about an hour to make sure it is clearing prior to restarting heparin   Vito Ralph, PharmD, BCPS Please see amion for complete clinical pharmacist phone list 07/18/2024  10:04 PM

## 2024-07-18 NOTE — Progress Notes (Signed)
 PHARMACY - ANTICOAGULATION CONSULT NOTE  Pharmacy Consult for heparin  Indication: pulmonary embolus  Allergies  Allergen Reactions   Penicillins Hives   Lucentis [Ranibizumab] Rash    Patient Measurements: Height: 6' 1 (185.4 cm) Weight: 79.4 kg (175 lb) IBW/kg (Calculated) : 79.9 HEPARIN  DW (KG): 79.4  Vital Signs: Temp: 100.6 F (38.1 C) (10/26 2330) Temp Source: Axillary (10/26 2330) BP: 109/74 (10/27 0000) Pulse Rate: 125 (10/27 0000)  Labs: Recent Labs    07/16/24 1959 07/17/24 0425 07/17/24 1352 07/17/24 1451 07/17/24 1453 07/17/24 1512 07/17/24 2140 07/17/24 2145 07/17/24 2353  HGB 14.7 12.4*   < > 10.5*   < > 9.2* 13.4 10.9*  --   HCT 40.6 35.6*   < > 31.5*   < > 27.0* 38.5* 32.0*  --   PLT 253 208  --  86*  --   --  211  --   --   APTT  --   --   --  49*  --   --   --   --   --   LABPROT  --  14.1  --  20.4*  --   --  20.9*  --   --   INR  --  1.0  --  1.7*  --   --  1.7*  --   --   HEPARINUNFRC  --   --   --  <0.10*  --   --   --   --  0.83*  CREATININE 1.08 0.99  --  1.50*  --   --  2.37*  --   --   CKTOTAL 1,321* 938*  --   --   --   --   --   --   --    < > = values in this interval not displayed.    Estimated Creatinine Clearance: 29.8 mL/min (A) (by C-G formula based on SCr of 2.37 mg/dL (H)).   Medical History: Past Medical History:  Diagnosis Date   Arthritis    Diabetes mellitus without complication (HCC)    GERD (gastroesophageal reflux disease)    Hypertension    Sleep apnea     Assessment: JC is a 32 YOM admitted with concern for spinal cord injury. Patient taken to the OR for neurosurgery, procedure complicated by multiple rounds of CPR. Patient transferred to CVICU for further management. Per MD, concern for pulmonary embolism on POCUS. Patient not on anticoagulation prior to hospitalization. Pharmacy consulted for heparin  dosing.    - No labs since early this morning prior to the OR. Hgb 12.4, PLT 208. Patient did not receive  heparin  in the OR. Per Dr. Harold, no bolus doses of heparin  with possible plans for further interventions.   AM: heparin  level above goal on 1300 units/hr. Per RN, no issues with heparin  level being drawn, but patient bleeding around central line-has been unchanged on shift. Last CBC shows Hgb 10.9  Goal of Therapy:  Heparin  level 0.3-0.7 units/ml Monitor platelets by anticoagulation protocol: Yes   Plan:  Decrease heparin  to 1150 units/hr - no bolus per MD Monitor heparin  level in 8 hours Monitor heparin  level, CBC, and s/sx of bleeding daily  Lynwood Poplar, PharmD, BCPS Clinical Pharmacist 07/18/2024 12:20 AM

## 2024-07-18 NOTE — Sedation Documentation (Addendum)
 1503 ACT: 233

## 2024-07-18 NOTE — Progress Notes (Signed)
    Providing Compassionate, Quality Care - Together   NEUROSURGERY PROGRESS NOTE     S: NAEs o/n.    O: EXAM:  BP 102/67 (BP Location: Right Arm)   Pulse (!) 117   Temp (!) 103.5 F (39.7 C) (Rectal)   Resp (!) 28   Ht 6' 1 (1.854 m)   Wt 79.5 kg   SpO2 99%   BMI 23.12 kg/m     Intubated, sedated Collar in place.  Incision appears well approximated with staples Mildly firm to touch inferior width of incision   ASSESSMENT:  78 y.o. with partial C7-T1 discectomy for SCI, aborted due to arrest/PEA.     PLAN: -Supportive care per CCM, appreciate mgmt.  -Continue collar.  -Continue to monitor hemodynamic stability in regard to potential intervention C spine.    Camie Pickle, [facility]

## 2024-07-18 NOTE — Progress Notes (Signed)
 Patient transported to IR and back to 2H16 without complications. RN at bedside.

## 2024-07-18 NOTE — Progress Notes (Signed)
 Orthopedic Tech Progress Note Patient Details:  Lawrence [name] November 22, 1945 [id number] Nurse called for ASPEN collar with more padding to avoid friction around the neck caused by the collar that is currently on the patient. Delivered to bedside and handed off to nurse for application. Ortho Devices Type of Ortho Device: Aspen cervical collar Ortho Device/Splint Location: C SPINE Ortho Device/Splint Interventions: Ordered   Post Interventions Patient Tolerated: Other (comment) Instructions Provided: Care of device  Louanne Calvillo L Jessah Danser 07/18/2024, 9:10 AM

## 2024-07-18 NOTE — TOC CAGE-AID Note (Signed)
 Transition of Care [facility]) - CAGE-AID Screening   Patient Details  Name: Lawrence [name] MRN: [id number] Date of Birth: May 15, 1946  [facility]) CM/SW Contact:    LEBRON ROCKIE ORN, RN Phone Number: [phone] 07/18/2024, 8:38 AM   Clinical Narrative: Pt denies alcohol or drug use. Screening complete.    CAGE-AID Screening:    Have You Ever Felt You Ought to Cut Down on Your Drinking or Drug Use?: No Have People Annoyed You By Critizing Your Drinking Or Drug Use?: No Have You Felt Bad Or Guilty About Your Drinking Or Drug Use?: No Have You Ever Had a Drink or Used Drugs First Thing In The Morning to Steady Your Nerves or to Get Rid of a Hangover?: No CAGE-AID Score: 0  Substance Abuse Education Offered: No

## 2024-07-18 NOTE — Sedation Documentation (Addendum)
 IR PE Thrombectomy Pressures:  Main Pulmonary Artery Pre-Intervention: 28/16 (19)  Main Pulmonary Artery Post-Intervention: 24/9 (15)

## 2024-07-18 NOTE — Progress Notes (Signed)
 PHARMACY - ANTICOAGULATION CONSULT NOTE  Pharmacy Consult for heparin  Indication: pulmonary embolus  Labs: Recent Labs    07/16/24 1959 07/17/24 0425 07/17/24 1352 07/17/24 1451 07/17/24 1453 07/17/24 2140 07/17/24 2145 07/18/24 0417 07/18/24 0425 07/18/24 0808 07/18/24 1612 07/18/24 1616 07/18/24 1707 07/18/24 1815 07/18/24 1949 07/18/24 2002 07/18/24 2306  HGB 14.7 12.4*   < > 10.5*   < > 13.4   < > 12.9*   < >  --    < > 10.5* 8.2* 8.8* 8.5*  --   --   HCT 40.6 35.6*   < > 31.5*   < > 38.5*   < > 36.8*   < >  --    < > 29.6* 24.0* 26.0* 25.0*  --   --   PLT 253 208  --  86*  --  211  --  199  --   --   --  125*  --   --   --   --   --   APTT  --   --   --  49*  --   --   --   --   --   --   --  >200*  --   --   --  >200*  --   LABPROT  --  14.1  --  20.4*  --  20.9*  --   --   --   --   --   --   --   --   --   --   --   INR  --  1.0  --  1.7*  --  1.7*  --   --   --   --   --   --   --   --   --   --   --   HEPARINUNFRC  --   --   --  <0.10*  --   --    < >  --   --  1.07*  --   --   --   --   --  >1.10* 0.47  CREATININE 1.08 0.99  --  1.50*  --  2.37*  --  2.90*  --   --   --  3.69*  --   --   --   --   --   CKTOTAL 1,321* 938*  --   --   --   --   --   --   --   --   --   --   --   --   --   --   --    < > = values in this interval not displayed.   Assessment: 78yo male now s/p thrombectomy for PEA arrest 2/2 large bilateral PE has been off heparin  after supratherapeutic PTT in IR, pt in renal failure and suspect this has caused severely decreased clearance of heparin  after getting large bolus in IR; now heparin  level is 0.47; will resume heparin  at low rate; noted plan for CRRT in am.  Goal of Therapy:  Heparin  level 0.3-0.7 units/ml   Plan:  Resume heparin  infusion at 700 units/hr. Check level in 8 hours.   Marvetta Dauphin, PharmD, BCPS 07/18/2024 11:58 PM

## 2024-07-18 NOTE — TOC CM/SW Note (Signed)
.  Transition of Care [facility]) - Inpatient Brief Assessment   Patient Details  Name: Lawrence [name] MRN: [id number] Date of Birth: Feb 05, 1946  Transition of Care [facility]) CM/SW Contact:    Justina Delcia Czar, RN Phone Number: [phone] 07/18/2024, 2:31 PM   Clinical Narrative: Patient admitted for PE with PEA arrest, currently intubated with heparin  gtt and Levophed.   Inpatient CM/CSW will continue to monitor pt's advancement through interdisciplinary progression rounds.  If new pt transition needs arise, MD please place a TOC consult.     Transition of Care Asessment: Insurance and Status: Insurance coverage has been reviewed Patient has primary care physician: Yes Home environment has been reviewed: home with wife Prior level of function:: independent Prior/Current Home Services: No current home services Social Drivers of Health Review: SDOH reviewed no interventions necessary Readmission risk has been reviewed: Yes Transition of care needs: transition of care needs identified, TOC will continue to follow

## 2024-07-18 NOTE — Procedures (Signed)
 Interventional Radiology Procedure:   Indications: PEA cardiac arrests with large bilateral pulmonary embolism  Procedure: 1) Bilateral pulmonary arteriography 2) Bilateral pulmonary artery suction thrombectomy 3) Pulmonary artery pressures  Findings:  Pre thrombectomy main PA pressure = 28/16, mean 20 Post thrombectomy main PA pressure = 24/9, mean 15 Removed large clot burden at right PA bifurcation and large clot burden in left lower lobe PA   Complications: None     EBL: less than 30 ml  Plan:  Return to ICU.  Keep external closure device on right groin for 24-48 hours.    Berdia Lachman R. Philip, MD  Pager: [phone]

## 2024-07-18 NOTE — Progress Notes (Signed)
 PHARMACY - ANTICOAGULATION CONSULT NOTE  Pharmacy Consult for heparin  Indication: pulmonary embolus  Allergies  Allergen Reactions   Penicillins Hives   Lucentis [Ranibizumab] Rash    Patient Measurements: Height: 6' 1 (185.4 cm) Weight: 79.5 kg (175 lb 4.3 oz) IBW/kg (Calculated) : 79.9 HEPARIN  DW (KG): 79.4  Vital Signs: Temp: 103.5 F (39.7 C) (10/27 0826) Temp Source: Rectal (10/27 0826) BP: 102/67 (10/27 0800) Pulse Rate: 117 (10/27 0825)  Labs: Recent Labs    07/16/24 1959 07/17/24 0425 07/17/24 1352 07/17/24 1451 07/17/24 1453 07/17/24 2140 07/17/24 2145 07/17/24 2353 07/18/24 0417 07/18/24 0425 07/18/24 0808  HGB 14.7 12.4*   < > 10.5*   < > 13.4 10.9*  --  12.9* 11.9*  --   HCT 40.6 35.6*   < > 31.5*   < > 38.5* 32.0*  --  36.8* 35.0*  --   PLT 253 208  --  86*  --  211  --   --  199  --   --   APTT  --   --   --  49*  --   --   --   --   --   --   --   LABPROT  --  14.1  --  20.4*  --  20.9*  --   --   --   --   --   INR  --  1.0  --  1.7*  --  1.7*  --   --   --   --   --   HEPARINUNFRC  --   --   --  <0.10*  --   --   --  0.83*  --   --  1.07*  CREATININE 1.08 0.99  --  1.50*  --  2.37*  --   --  2.90*  --   --   CKTOTAL 1,321* 938*  --   --   --   --   --   --   --   --   --    < > = values in this interval not displayed.    Estimated Creatinine Clearance: 24.4 mL/min (A) (by C-G formula based on SCr of 2.9 mg/dL (H)).   Medical History: Past Medical History:  Diagnosis Date   Arthritis    Diabetes mellitus without complication (HCC)    GERD (gastroesophageal reflux disease)    Hypertension    Sleep apnea     Assessment: JC is a 68 YOM admitted with concern for spinal cord injury. Patient taken to the OR for neurosurgery, procedure complicated by multiple rounds of CPR. Patient transferred to CVICU for further management. Per MD, concern for pulmonary embolism on POCUS. Patient not on anticoagulation prior to hospitalization. Pharmacy  consulted for heparin  dosing.    - Baseline Hgb 12.4, PLT 208. Patient did not receive heparin  in the OR. Per Dr. Harold, no bolus doses of heparin  with possible plans for further interventions.   Heparin  level 1.08 above goal and trending up despite rate decrease to 1150 units/hr in the setting of worsening AKI.  Ongoing oozing around central line site - stable since last night. Hgb 12.9, pltc 199 - stable.    Goal of Therapy:  Heparin  level 0.3-0.7 units/ml Monitor platelets by anticoagulation protocol: Yes   Plan:  Hold heparin  x1h Restart heparin  IV at reduced rate of 900 units/hr @1000  8h heparin  level Monitor heparin  level, CBC, and s/sx of bleeding daily  Maurilio Fila, PharmD Clinical  Pharmacist 07/18/2024  9:04 AM

## 2024-07-18 NOTE — Progress Notes (Addendum)
 Patient returned from IR.    Remains intubated and sedated, initially off levo/vaso and on epi only.  Rapidly developed hypotension >> did not respond to protocol driven vasopressor adjustments and rapid titration required. Adjust fluid carrier through vasopressor line. Labs sent and hemodynamics stabilized.  Additional IVF bolus ordered, minimal UOP with CVP 6. BMP pending. CBC with stable hgb. .  ABG obtained with respiratory alkalosis > vent settings adjusted and repeat ABG showed 7.6/22/146/21 >> RT notified for further vent adjustments. Plan for continued sedation and mechanical ventilation tonight, SAT/SBT in am.   Of note, prior to IR patient was noted to be following commands in upper extremities per RN.   Additional CCT: 20 min

## 2024-07-18 NOTE — Sedation Documentation (Signed)
 Pt. To IR for ordered procedure at 1356. Gaither, RN and Herlene, RN at bedside with Ileana, RT. RN x2 to stay at bedside during procedure due to titratable drips per policy, Gaither and Herlene, RN confirmed. See MAR for medications at time of IR arrival and during procedure.

## 2024-07-18 NOTE — Progress Notes (Signed)
 eLink Physician-Brief Progress Note Patient Name: Lawrence [name] DOB: [birth year] MRN: [id number]   Date of Service  07/18/2024  HPI/Events of Note  BMP from 16:16 today with K+ 3.2- didn't see replacement     see ABG from 19:49    on vent with c-line and OG  Labs reviewed Resp alkalosis.   Camera: RR 16, VT 600.  On fenta 25, 10 of propofol .    eICU Interventions  Decrease VT 580. Follow ABG at mid night  Planning for CRRT for AM. Potassium replacement ordered via central line.       Intervention Category Intermediate Interventions: Electrolyte abnormality - evaluation and management  Lawrence [name] 07/18/2024, 9:17 PM

## 2024-07-18 NOTE — Progress Notes (Signed)
 Vant changes made per Dr. Gatha, CCM post ABG results.

## 2024-07-18 NOTE — Consult Note (Signed)
 Advanced Heart Failure Team Consult Note   Primary Physician: Viviana Candyce HERO, MD Cardiologist:  Debby Sor, MD (Inactive)  Reason for Consultation: Massive PE. Possible ECMO   HPI:    Lawrence [name] is 78 year old male with diabetes, hypertension, BPH status post TURP who presented after having a ground-level fall about 5 days PTA. On presentation patient was paraplegic with complete loss of strength and sensation in LEs + incontinence.   Neurosurgery was consulted, patient taken to OR for partial anterior cervical discectomy C7-T1 with the understanding that he was unlikely to regain strength in his lower extremities.   Intra-op course complicated with PEA arrest, procedure was aborted, TEE was done which showed severe RV dysfunction and dilatation with moderate to severe LV dysfunction, patient was transferred to ICU for close monitoring.  In ICU patient blood pressure started dropping and he went into PEA cardiac arrest again, ROSC was achieved after 2 minutes.  Unfortunately due to surgery, it was recommended no TNK/TPA. CT scan showed massive bilateral PE with very limites lung perfusion   Now intubated. Unresponsive off sedation currently. On VP 0.03 NE 9   SCr up to 2.9 + shock liver  LA 11.6 -> 8.6 -> 4.6  I did POCUS echo LVEF 55% RV moderately HK   He has been seen by IR and offered attempt at PE thrombectomy. Consult called for candidacy for [facility] ECMO back-up.    Home Medications Prior to Admission medications   Medication Sig Start Date End Date Taking? Authorizing Provider  Apple Cider Vinegar 600 MG CAPS Take 600 mg by mouth daily.   Yes [provider]  aspirin  EC 81 MG tablet Take 1 tablet (81 mg total) by mouth every other day. 11/14/21  Yes Rehman, Claudis PENNER, MD  Barberry-Oreg Grape-Goldenseal (BERBERINE COMPLEX PO) Take 1 capsule by mouth daily.   Yes [provider]  cholecalciferol (VITAMIN D3) 25 MCG (1000 UNIT) tablet Take 1,000 Units by  mouth daily.   Yes [provider]  Cinnamon 500 MG capsule Take 500 mg by mouth daily.   Yes [provider]  diphenhydrAMINE HCl (NERVINE PO) Take 1 tablet by mouth daily. Patient states that he takes one a day   Yes [provider]  empagliflozin (JARDIANCE) 10 MG TABS tablet Take 10 mg by mouth daily.   Yes [provider]  ferrous sulfate 325 (65 FE) MG tablet Take 325 mg by mouth daily with breakfast.   Yes [provider]  hydrochlorothiazide  (HYDRODIURIL ) 25 MG tablet Take 25 mg by mouth daily. 07/23/21  Yes [provider]  insulin  degludec (TRESIBA  FLEXTOUCH) 100 UNIT/ML FlexTouch Pen Inject 12 Units into the skin at bedtime. Patient taking differently: Inject 28 Units into the skin 2 (two) times daily. 10/07/22  Yes Reardon, Benton PARAS, NP  Multiple Vitamins-Minerals (OCUVITE ADULT 50+ PO) Take 1 tablet by mouth daily.   Yes [provider]  OVER THE COUNTER MEDICATION Take 1 capsule by mouth daily. Prostagenix   Yes [provider]  OVER THE COUNTER MEDICATION Take 1 capsule by mouth daily. Diabacore (Diabetic Supplement)   Yes [provider]  OVER THE COUNTER MEDICATION Take 1 tablet by mouth in the morning and at bedtime. Glucoceil   Yes [provider]  tadalafil  (CIALIS ) 5 MG tablet Take 1 tablet (5 mg total) by mouth daily as needed for erectile dysfunction. 12/06/20  Yes Watt Rush, MD  vitamin E 180 MG (400 UNITS) capsule  Take 400 Units by mouth daily.   Yes [provider]  Vitamin Mixture (ESTER-C PO) Take 1,000 mg by mouth daily.   Yes [provider]    Past Medical History: Past Medical History:  Diagnosis Date   Arthritis    Diabetes mellitus without complication (HCC)    GERD (gastroesophageal reflux disease)    Hypertension    Sleep apnea     Past Surgical History: Past Surgical History:  Procedure Laterality Date   APPENDECTOMY     78 yo   BACK SURGERY   1975   lumbar diskectomy   done [facility]   COLONOSCOPY N/A 02/21/2015   Procedure: COLONOSCOPY;  Surgeon: Claudis RAYMOND Rivet, MD;  [facility]: [facility];  Service: Endoscopy;  Laterality: N/A;  1025   COLONOSCOPY  11/13/2021   COLONOSCOPY WITH PROPOFOL  N/A 11/13/2021   Procedure: COLONOSCOPY WITH PROPOFOL ;  Surgeon: Rivet Claudis RAYMOND, MD;  Location: [facility];  Service: Endoscopy;  Laterality: N/A;  155   CYSTOSCOPY WITH URETHRAL DILATATION N/A 12/09/2022   Procedure: CYSTOSCOPY WITH URETHRAL DILATATION;  Surgeon: Watt Rush, MD;  Location: [facility];  Service: Urology;  Laterality: N/A;   EYE SURGERY Bilateral    Lasik surgery   LEFT HEART CATH AND CORONARY ANGIOGRAPHY N/A 12/23/2020   Procedure: LEFT HEART CATH AND CORONARY ANGIOGRAPHY;  Surgeon: Burnard Debby LABOR, MD;  Location: [facility];  Service: Cardiovascular;  Laterality: N/A;   NECK SURGERY  1999   Work injury, posterior fusion   POLYPECTOMY  11/13/2021   Procedure: POLYPECTOMY INTESTINAL;  Surgeon: Rivet Claudis RAYMOND, MD;  Location: [facility];  Service: Endoscopy;;   SHOULDER SURGERY Bilateral 2000   rotoator cuff repair   done at [facility]   TONSILLECTOMY     TRANSURETHRAL INCISION OF PROSTATE N/A 12/09/2022   Procedure: TRANSURETHRAL RESECTION OF PROSTATE;  Surgeon: Watt Rush, MD;  Location: [facility];  Service: Urology;  Laterality: N/A;  1 HR FOR CASE    Family History: History reviewed. No pertinent family history.  Social History: Social History   Socioeconomic History   Marital status: Married    Spouse name: Not on file   Number of children: 3   Years of education: Not on file   Highest education level: Not on file  Occupational History   Occupation: retired  Tobacco Use   Smoking status: Never   Smokeless tobacco: Never  Vaping Use   Vaping status: Never Used  Substance and Sexual Activity   Alcohol use: No   Drug use: No   Sexual activity: Not Currently  Other Topics Concern    Not on file  Social History Narrative   Not on file   Social Drivers of Health   Financial Resource Strain: Not on file  Food Insecurity: No Food Insecurity (07/17/2024)   Hunger Vital Sign    Worried About Running Out of Food in the Last Year: Never true    Ran Out of Food in the Last Year: Never true  Transportation Needs: No Transportation Needs (07/17/2024)   PRAPARE - Administrator, Civil Service (Medical): No    Lack of Transportation (Non-Medical): No  Physical Activity: Not on file  Stress: Not on file  Social Connections: Moderately Isolated (07/17/2024)   Social Connection and Isolation Panel    Frequency of Communication with Friends and Family: More than three times a week    Frequency of Social Gatherings with Friends and Family:  Once a week    Attends Religious Services: Patient unable to answer    Active Member of Clubs or Organizations: No    Attends Banker Meetings: Never    Marital Status: Married    Allergies:  Allergies  Allergen Reactions   Penicillins Hives   Lucentis [Ranibizumab] Rash    Objective:    Vital Signs:   Temp:  [94.5 F (34.7 C)-103.6 F (39.8 C)] 97 F (36.1 C) (10/27 1300) Pulse Rate:  [74-149] 76 (10/27 1300) Resp:  [0-33] 25 (10/27 1300) BP: (67-141)/(50-80) 122/70 (10/27 1300) SpO2:  [64 %-100 %] 100 % (10/27 1300) Arterial Line BP: (71-272)/(41-122) 139/59 (10/27 1300) FiO2 (%):  [40 %-100 %] 40 % (10/27 1200) Weight:  [79.5 kg] 79.5 kg (10/27 0430)    Weight change: Filed Weights   07/16/24 1816 07/16/24 2239 07/18/24 0430  Weight: 79.4 kg 79.4 kg 79.5 kg    Intake/Output:   Intake/Output Summary (Last 24 hours) at 07/18/2024 1338 Last data filed at 07/18/2024 1200 Gross per 24 hour  Intake 3837.05 ml  Output 120 ml  Net 3717.05 ml      Physical Exam    General:  Intubated unresponsive HEENT: normal +ETT Neck: + neck collar surgical scar  Cor: Regular rate & rhythm. No rubs,  gallops or murmurs. Lungs: clear Abdomen: soft, nontender, nondistended. No hepatosplenomegaly. No bruits or masses. Good bowel sounds. Extremities: no cyanosis, clubbing, rash, edema Neuro:unresponisve flaccid LEs   Telemetry   Sinus 70s Personally reviewed   Labs   Basic Metabolic Panel: Recent Labs  Lab 07/16/24 1959 07/17/24 0425 07/17/24 1352 07/17/24 1451 07/17/24 1453 07/17/24 1512 07/17/24 2140 07/17/24 2145 07/18/24 0417 07/18/24 0425  NA 132* 135   < > 137   < > 139 138 138 134* 136  K 3.7 3.6   < > 3.4*   < > 4.2 4.1 3.5 3.7 3.9  CL 91* 98  --  101  --   --  98  --  100  --   CO2 30 24  --  18*  --   --  19*  --  19*  --   GLUCOSE 361* 180*  --  286*  --   --  393*  --  372*  --   BUN 24* 22  --  25*  --   --  35*  --  41*  --   CREATININE 1.08 0.99  --  1.50*  --   --  2.37*  --  2.90*  --   CALCIUM 10.2 9.0  --  7.8*  --   --  9.3  --  8.6*  --   MG  --  2.2  --   --   --   --  2.0  --  1.8  --   PHOS  --  3.4  --   --   --   --   --   --   --   --    < > = values in this interval not displayed.    Liver Function Tests: Recent Labs  Lab 07/16/24 1959 07/18/24 0417  AST 46* 1,476*  ALT 20 978*  ALKPHOS 103 58  BILITOT 0.7 1.3*  PROT 7.3 4.3*  ALBUMIN 4.3 2.1*   Recent Labs  Lab 07/18/24 0417  LIPASE 29   No results for input(s): AMMONIA in the last 168 hours.  CBC: Recent Labs  Lab 07/16/24 1959 07/17/24 0425 07/17/24  1352 07/17/24 1451 07/17/24 1453 07/17/24 1512 07/17/24 2140 07/17/24 2145 07/18/24 0417 07/18/24 0425  WBC 12.0* 9.7  --  12.0*  --   --  22.4*  --  16.1*  --   NEUTROABS 10.3*  --   --   --   --   --   --   --   --   --   HGB 14.7 12.4*   < > 10.5*   < > 9.2* 13.4 10.9* 12.9* 11.9*  HCT 40.6 35.6*   < > 31.5*   < > 27.0* 38.5* 32.0* 36.8* 35.0*  MCV 89.6 91.5  --  95.7  --   --  92.5  --  90.9  --   PLT 253 208  --  86*  --   --  211  --  199  --    < > = values in this interval not displayed.    Cardiac  Enzymes: Recent Labs  Lab 07/16/24 1959 07/17/24 0425  CKTOTAL 1,321* 938*    BNP: BNP (last 3 results) No results for input(s): BNP in the last 8760 hours.  ProBNP (last 3 results) No results for input(s): PROBNP in the last 8760 hours.   CBG: Recent Labs  Lab 07/18/24 0912 07/18/24 1017 07/18/24 1121 07/18/24 1226 07/18/24 1332  GLUCAP 176* 138* 133* 129* 124*    Coagulation Studies: Recent Labs    07/17/24 0425 07/17/24 1451 07/17/24 2140  LABPROT 14.1 20.4* 20.9*  INR 1.0 1.7* 1.7*     Imaging   CT Angio Chest Pulmonary Embolism (PE) W or WO Contrast Addendum Date: 07/17/2024 ADDENDUM REPORT: 07/17/2024 18:30 ADDENDUM: Critical Value/emergent results were called by telephone at the time of interpretation on 07/17/2024 at 6:30 pm to provider Rolan Sharps, who verbally acknowledged these results. Electronically Signed   By: Leita Birmingham M.D.   On: 07/17/2024 18:30   Result Date: 07/17/2024 CLINICAL DATA:  Pulmonary embolism suspected, high probability. PE seen on echo, follow-up. EXAM: CT ANGIOGRAPHY CHEST WITH CONTRAST TECHNIQUE: Multidetector CT imaging of the chest was performed using the standard protocol during bolus administration of intravenous contrast. Multiplanar CT image reconstructions and MIPs were obtained to evaluate the vascular anatomy. RADIATION DOSE REDUCTION: This exam was performed according to the departmental dose-optimization program which includes automated exposure control, adjustment of the mA and/or kV according to patient size and/or use of iterative reconstruction technique. CONTRAST:  75mL OMNIPAQUE  IOHEXOL  350 MG/ML SOLN COMPARISON:  None Available. FINDINGS: Cardiovascular: The heart is normal in size and there is a trace pericardial effusion. A few scattered coronary artery calcifications are present. There is atherosclerotic calcification of the aorta without evidence of aneurysm. The pulmonary trunk is distended suggesting  underlying pulmonary artery hypertension. Pulmonary artery filling defects are noted in the left and right pulmonary arteries extending into the lobar, segmental and subsegmental arteries bilaterally. There is extensive thrombus burden. The right ventricle is distended suggesting right heart strain. A right internal jugular central venous catheter terminates in the superior vena cava. Mediastinum/Nodes: No mediastinal, hilar, or axillary lymphadenopathy is seen. The thyroid gland is within normal limits. An endotracheal tube and enteric tube are identified. Lungs/Pleura: There are small bilateral pleural effusions, greater on the right than the left, with associated atelectasis. Scattered infiltrates are present in the left upper lobe, suspicious for pneumonia. No pneumothorax is seen. Upper Abdomen: Hyperdense material is present in the gallbladder, possible sludge or stones. Mild ascites is noted in the upper abdomen. An enteric  tube terminates in the stomach. Mild fat stranding is present about the pancreas. No pancreatic ductal dilatation. Musculoskeletal: Degenerative changes are present in the thoracic spine. Postsurgical changes are noted in the cervical region with air in the subcutaneous soft tissues. There is a fracture of the T6 rib on the right anteriorly. There are fractures of the T5 and T6 ribs on the left anteriorly. Old healed rib fractures are present on the left. Review of the MIP images confirms the above findings. IMPRESSION: 1. Extensive pulmonary artery thrombus bilaterally with evidence of right heart strain. 2. Scattered infiltrates in the left upper lobe, suspicious for pneumonia. 3. Small bilateral pleural effusions. 4. Acute fractures of the T6 rib on the right in T5 and T6 ribs on the left. 5. Fat stranding about the pancreas, possible pancreatitis. Correlation with amylase and lipase is suggested. 6. Small ascites in the upper abdomen. 7. Coronary artery calcifications and aortic  atherosclerosis. 8. Postsurgical changes in the cervical region with subcutaneous air. 9. Hyperdense material in the gallbladder, possible stones or sludge. Electronically Signed: By: Leita Birmingham M.D. On: 07/17/2024 17:40   CT HEAD WO CONTRAST ( ) Result Date: 07/17/2024 EXAM: CT HEAD WITHOUT CONTRAST 07/17/2024 05:18:17 PM TECHNIQUE: CT of the head was performed without the administration of intravenous contrast. Automated exposure control, iterative reconstruction, and/or weight based adjustment of the mA/kV was utilized to reduce the radiation dose to as low as reasonably achievable. COMPARISON: 07/16/2024 CLINICAL HISTORY: Multiple episodes of PEA cardiac arrest during attempted cervical spine surgery. FINDINGS: BRAIN AND VENTRICLES: No acute hemorrhage. No evidence of acute infarct. No hydrocephalus. No extra-axial collection. No mass effect or midline shift. Atrophy is similar to the prior study, mildly advanced for age. White matter hypoattenuation is similar to the prior study, mildly advanced for age. Calcified atherosclerosis at the skull base. ORBITS: Bilateral cataract extraction. No acute abnormality. SINUSES: Mild mucosal thickening in paranasal sinuses. Secretions in nasopharynx, likely related to intubation. SOFT TISSUES AND SKULL: No acute soft tissue abnormality. No skull fracture. IMPRESSION: 1. No acute intracranial abnormality. 2. Mildly advanced cerebral atrophy and mild chronic small vessel ischemic disease, similar to the prior study. Electronically signed by: Lonni Necessary MD 07/17/2024 05:49 PM EDT RP Workstation: [id number]   DG CHEST PORT 1 VIEW Result Date: 07/17/2024 CLINICAL DATA:  Central line placement and intubation. EXAM: PORTABLE CHEST 1 VIEW COMPARISON:  12/23/2020. FINDINGS: Endotracheal tube terminates approximately 5.9 cm above the carina. Nasogastric tube terminates in the stomach. Right IJ central line tip is in the SVC. A defibrillator pad overlies the  lower left chest. Trachea is midline. Heart size stable. No airspace consolidation or pleural fluid. Surgical skin staples in the neck. IMPRESSION: 1. Satisfactory support apparatus placement. 2. No acute findings. Electronically Signed   By: Newell Eke M.D.   On: 07/17/2024 16:24   DG C-Arm 1-60 Min-No Report Result Date: 07/17/2024 Fluoroscopy was utilized by the requesting physician.  No radiographic interpretation.   DG C-Arm 1-60 Min-No Report Result Date: 07/17/2024 Fluoroscopy was utilized by the requesting physician.  No radiographic interpretation.   DG C-Arm 1-60 Min-No Report Result Date: 07/17/2024 Fluoroscopy was utilized by the requesting physician.  No radiographic interpretation.     Medications:     Current Medications:  calcium chloride  1 g Intravenous Once   Chlorhexidine  Gluconate Cloth  6 each Topical Daily   mouth rinse  15 mL Mouth Rinse Q2H   pantoprazole  (PROTONIX ) IV  40 mg Intravenous Q24H  polyethylene glycol  17 g Per Tube Daily   senna-docusate  2 tablet Per Tube QHS   sodium chloride  flush  3 mL Intravenous Q12H    Infusions:  sodium chloride  Stopped (07/18/24 1032)   ceFEPime (MAXIPIME) IV Stopped (07/18/24 1027)   dexmedetomidine (PRECEDEX) IV infusion Stopped (07/18/24 9062)   epinephrine 3 mcg/min (07/18/24 1200)   fentaNYL  infusion INTRAVENOUS 50 mcg/hr (07/18/24 1200)   heparin  900 Units/hr (07/18/24 1259)   insulin  3.6 Units/hr (07/18/24 1200)   midazolam  Stopped (07/18/24 9062)   norepinephrine (LEVOPHED) Adult infusion 9 mcg/min (07/18/24 1200)   propofol  (DIPRIVAN ) infusion 10 mcg/kg/min (07/18/24 1200)   vasopressin 0.03 Units/min (07/18/24 1257)      Assessment/Plan   1. Massive PE with multiple PEA arrests 2. Cardiogenic shock due to #1 3. C7 fracture with paraplegia (likely irreversible)  4. Acute hypoxic respiratory failure  5. AKI due to ATN 6. 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   SAVE Score -14 (In-house survival with ECMO predicted at 18%)  D/w CCM and full ECMO team at bedside.   CRITICAL CARE Performed by: Cherrie Sieving  Total critical care time: 55 minutes  Critical care time was exclusive of separately billable procedures and treating other patients.  Critical care was necessary to treat or prevent imminent or life-threatening deterioration.  Critical care was time spent personally by me (independent of midlevel providers or residents) on the following activities: development of treatment plan with patient and/or surrogate as well as nursing, discussions with consultants, evaluation of patient's response to treatment, examination of patient, obtaining history from patient or surrogate, ordering and performing treatments and interventions, ordering and review of laboratory studies, ordering and review of radiographic studies, pulse oximetry and re-evaluation of patient's condition.    Length of Stay: 1  Sieving Cherrie, MD  07/18/2024, 1:38 PM    Advanced Heart Failure Team Pager [phone] (M-F; 7a - 5p)  Please contact [facility] for night-coverage after hours (4p -7a ) and weekends on [email or web address]

## 2024-07-19 ENCOUNTER — Inpatient Hospital Stay (HOSPITAL_COMMUNITY)

## 2024-07-19 ENCOUNTER — Encounter (HOSPITAL_COMMUNITY): Payer: Self-pay | Admitting: Neurosurgery

## 2024-07-19 DIAGNOSIS — R9431 Abnormal electrocardiogram [ECG] [EKG]: Secondary | ICD-10-CM | POA: Diagnosis not present

## 2024-07-19 LAB — HEPARIN LEVEL (UNFRACTIONATED)
Heparin Unfractionated: 0.2 [IU]/mL — ABNORMAL LOW (ref 0.30–0.70)
Heparin Unfractionated: 0.33 [IU]/mL (ref 0.30–0.70)

## 2024-07-19 LAB — CBC
HCT: 26.3 % — ABNORMAL LOW (ref 39.0–52.0)
Hemoglobin: 9 g/dL — ABNORMAL LOW (ref 13.0–17.0)
MCH: 31.7 pg (ref 26.0–34.0)
MCHC: 34.2 g/dL (ref 30.0–36.0)
MCV: 92.6 fL (ref 80.0–100.0)
Platelets: 122 K/uL — ABNORMAL LOW (ref 150–400)
RBC: 2.84 MIL/uL — ABNORMAL LOW (ref 4.22–5.81)
RDW: 11.5 % (ref 11.5–15.5)
WBC: 14 K/uL — ABNORMAL HIGH (ref 4.0–10.5)
nRBC: 0 % (ref 0.0–0.2)

## 2024-07-19 LAB — GLUCOSE, CAPILLARY
Glucose-Capillary: 111 mg/dL — ABNORMAL HIGH (ref 70–99)
Glucose-Capillary: 114 mg/dL — ABNORMAL HIGH (ref 70–99)
Glucose-Capillary: 125 mg/dL — ABNORMAL HIGH (ref 70–99)
Glucose-Capillary: 128 mg/dL — ABNORMAL HIGH (ref 70–99)
Glucose-Capillary: 132 mg/dL — ABNORMAL HIGH (ref 70–99)
Glucose-Capillary: 136 mg/dL — ABNORMAL HIGH (ref 70–99)
Glucose-Capillary: 136 mg/dL — ABNORMAL HIGH (ref 70–99)
Glucose-Capillary: 138 mg/dL — ABNORMAL HIGH (ref 70–99)
Glucose-Capillary: 173 mg/dL — ABNORMAL HIGH (ref 70–99)
Glucose-Capillary: 191 mg/dL — ABNORMAL HIGH (ref 70–99)
Glucose-Capillary: 359 mg/dL — ABNORMAL HIGH (ref 70–99)
Glucose-Capillary: 86 mg/dL (ref 70–99)
Glucose-Capillary: 89 mg/dL (ref 70–99)

## 2024-07-19 LAB — ECHOCARDIOGRAM COMPLETE
Area-P 1/2: 3.85 cm2
Calc EF: 69 %
Height: 73 in
S' Lateral: 2.5 cm
Single Plane A2C EF: 75.3 %
Single Plane A4C EF: 75.2 %
Weight: 2903.02 [oz_av]

## 2024-07-19 LAB — COMPREHENSIVE METABOLIC PANEL WITH GFR
ALT: 673 U/L — ABNORMAL HIGH (ref 0–44)
AST: 622 U/L — ABNORMAL HIGH (ref 15–41)
Albumin: 1.9 g/dL — ABNORMAL LOW (ref 3.5–5.0)
Alkaline Phosphatase: 50 U/L (ref 38–126)
Anion gap: 13 (ref 5–15)
BUN: 56 mg/dL — ABNORMAL HIGH (ref 8–23)
CO2: 22 mmol/L (ref 22–32)
Calcium: 8 mg/dL — ABNORMAL LOW (ref 8.9–10.3)
Chloride: 100 mmol/L (ref 98–111)
Creatinine, Ser: 4.18 mg/dL — ABNORMAL HIGH (ref 0.61–1.24)
GFR, Estimated: 14 mL/min — ABNORMAL LOW (ref >60)
Glucose, Bld: 137 mg/dL — ABNORMAL HIGH (ref 70–99)
Potassium: 3.8 mmol/L (ref 3.5–5.1)
Sodium: 135 mmol/L (ref 135–145)
Total Bilirubin: 2.3 mg/dL — ABNORMAL HIGH (ref 0.0–1.2)
Total Protein: 3.9 g/dL — ABNORMAL LOW (ref 6.5–8.1)

## 2024-07-19 LAB — BASIC METABOLIC PANEL WITH GFR
Anion gap: 10 (ref 5–15)
BUN: 69 mg/dL — ABNORMAL HIGH (ref 8–23)
CO2: 24 mmol/L (ref 22–32)
Calcium: 7.7 mg/dL — ABNORMAL LOW (ref 8.9–10.3)
Chloride: 101 mmol/L (ref 98–111)
Creatinine, Ser: 5.16 mg/dL — ABNORMAL HIGH (ref 0.61–1.24)
GFR, Estimated: 11 mL/min — ABNORMAL LOW (ref >60)
Glucose, Bld: 131 mg/dL — ABNORMAL HIGH (ref 70–99)
Potassium: 4.4 mmol/L (ref 3.5–5.1)
Sodium: 135 mmol/L (ref 135–145)

## 2024-07-19 LAB — POCT I-STAT 7, (LYTES, BLD GAS, ICA,H+H)
Acid-base deficit: 3 mmol/L — ABNORMAL HIGH (ref 0.0–2.0)
Bicarbonate: 20.8 mmol/L (ref 20.0–28.0)
Calcium, Ion: 1.08 mmol/L — ABNORMAL LOW (ref 1.15–1.40)
HCT: 21 % — ABNORMAL LOW (ref 39.0–52.0)
Hemoglobin: 7.1 g/dL — ABNORMAL LOW (ref 13.0–17.0)
O2 Saturation: 100 %
Patient temperature: 37.5
Potassium: 3.3 mmol/L — ABNORMAL LOW (ref 3.5–5.1)
Sodium: 140 mmol/L (ref 135–145)
TCO2: 22 mmol/L (ref 22–32)
pCO2 arterial: 29.2 mmHg — ABNORMAL LOW (ref 32–48)
pH, Arterial: 7.461 — ABNORMAL HIGH (ref 7.35–7.45)
pO2, Arterial: 158 mmHg — ABNORMAL HIGH (ref 83–108)

## 2024-07-19 LAB — RENAL FUNCTION PANEL
Albumin: 1.8 g/dL — ABNORMAL LOW (ref 3.5–5.0)
Anion gap: 12 (ref 5–15)
BUN: 73 mg/dL — ABNORMAL HIGH (ref 8–23)
CO2: 24 mmol/L (ref 22–32)
Calcium: 7.6 mg/dL — ABNORMAL LOW (ref 8.9–10.3)
Chloride: 101 mmol/L (ref 98–111)
Creatinine, Ser: 5.41 mg/dL — ABNORMAL HIGH (ref 0.61–1.24)
GFR, Estimated: 10 mL/min — ABNORMAL LOW (ref >60)
Glucose, Bld: 123 mg/dL — ABNORMAL HIGH (ref 70–99)
Phosphorus: 6.4 mg/dL — ABNORMAL HIGH (ref 2.5–4.6)
Potassium: 4.6 mmol/L (ref 3.5–5.1)
Sodium: 137 mmol/L (ref 135–145)

## 2024-07-19 LAB — TRIGLYCERIDES: Triglycerides: 146 mg/dL (ref <150)

## 2024-07-19 LAB — LACTIC ACID, PLASMA: Lactic Acid, Venous: 1.4 mmol/L (ref 0.5–1.9)

## 2024-07-19 MED ORDER — SODIUM CHLORIDE 3 % IN NEBU
4.0000 mL | INHALATION_SOLUTION | Freq: Two times a day (BID) | RESPIRATORY_TRACT | Status: AC
  Administered 2024-07-19 – 2024-07-22 (×6): 4 mL via RESPIRATORY_TRACT
  Filled 2024-07-19 (×6): qty 4

## 2024-07-19 MED ORDER — LACTATED RINGERS IV SOLN
INTRAVENOUS | Status: DC
Start: 2024-07-19 — End: 2024-07-19

## 2024-07-19 MED ORDER — GLYCOPYRROLATE 0.2 MG/ML IJ SOLN
0.1000 mg | Freq: Three times a day (TID) | INTRAMUSCULAR | Status: DC | PRN
  Administered 2024-07-19: 0.1 mg via INTRAVENOUS
  Filled 2024-07-19: qty 1

## 2024-07-19 MED ORDER — PERFLUTREN LIPID MICROSPHERE
1.0000 mL | INTRAVENOUS | Status: AC | PRN
  Administered 2024-07-19: 2 mL via INTRAVENOUS

## 2024-07-19 MED ORDER — LACTATED RINGERS IV BOLUS
500.0000 mL | Freq: Once | INTRAVENOUS | Status: AC
  Administered 2024-07-19: 500 mL via INTRAVENOUS

## 2024-07-19 MED ORDER — INSULIN ASPART 100 UNIT/ML IJ SOLN
0.0000 [IU] | INTRAMUSCULAR | Status: DC
  Administered 2024-07-19: 2 [IU] via SUBCUTANEOUS
  Administered 2024-07-19 (×4): 1 [IU] via SUBCUTANEOUS
  Administered 2024-07-20: 2 [IU] via SUBCUTANEOUS
  Administered 2024-07-20 (×2): 1 [IU] via SUBCUTANEOUS
  Administered 2024-07-20: 2 [IU] via SUBCUTANEOUS
  Administered 2024-07-20: 1 [IU] via SUBCUTANEOUS
  Administered 2024-07-20: 2 [IU] via SUBCUTANEOUS
  Administered 2024-07-21 (×2): 5 [IU] via SUBCUTANEOUS
  Filled 2024-07-19: qty 1

## 2024-07-19 MED ORDER — HEPARIN SODIUM (PORCINE) 1000 UNIT/ML DIALYSIS: 1000.0000 [IU] | INTRAMUSCULAR | Status: DC | PRN

## 2024-07-19 NOTE — Progress Notes (Signed)
 NAME:  Lawrence [name], MRN:  [id number], DOB:  July 13, 1946, LOS: 2 ADMISSION DATE:  07/16/2024, CONSULTATION DATE: 07/17/2024 REFERRING MD:  Debby Carrier , CHIEF COMPLAINT: Status postcardiac arrest  History of Present Illness:  78 year old male with diabetes, hypertension, BPH status post TURP who presented after having a ground-level fall with recent onset of numbness in his lower extremity- complete loss of sensation to BLE & urinary incontinence after fall.      Neurosurgery was consulted, patient mental OR for partial anterior cervical discectomy C7-T1 but course was complicated with multiple time PEA cardiac arrest in OR, procedure was aborted, TEE was done which showed severe RV dysfunction and dilatation with moderate to severe LV dysfunction, patient was transferred to ICU for close monitoring.  In ICU patient blood pressure started dropping and he went into PEA cardiac arrest again, ROSC was achieved after 2 minutes.  Unfortunately due to surgery, it was recommended no TNK/TPA.  Due to tenuous status and concern for re-arrest it was decided to hold on suction thrombectomy at the time.   Pertinent  Medical History   Past Medical History:  Diagnosis Date   Arthritis    Diabetes mellitus without complication (HCC)    GERD (gastroesophageal reflux disease)    Hypertension    Sleep apnea      Significant Hospital Events: Including procedures, antibiotic start and stop dates in addition to other pertinent events   Admitted and PCCM consulted, coded multiple times in OR (PEA) 07/18/2024 mechanical thrombectomy was done for bilateral pulmonary embolism.  Interim History / Subjective:  Hemodynamics stable overnight, deeply sedated this am. Febrile and TTM started  Objective    Blood pressure 135/60, pulse 100, temperature 98.2 F (36.8 C), resp. rate 17, height 6' 1 (1.854 m), weight 82.3 kg, SpO2 100%. CVP:  [5 mmHg-12 mmHg] 10 mmHg  Vent Mode: PRVC FiO2 (%):  [40 %] 40 % Set  Rate:  [16 bmp-28 bmp] 16 bmp Vt Set:  [580 mL-640 mL] 580 mL PEEP:  [5 cmH20-8 cmH20] 5 cmH20 Plateau Pressure:  [17 cmH20-23 cmH20] 17 cmH20   Intake/Output Summary (Last 24 hours) at 07/19/2024 1047 Last data filed at 07/19/2024 1000 Gross per 24 hour  Intake 1885.31 ml  Output 0 ml  Net 1885.31 ml   Filed Weights   07/16/24 2239 07/18/24 0430 07/19/24 0430  Weight: 79.4 kg 79.5 kg 82.3 kg    Examination: General: Crtitically ill-appearing male, orally intubated, patient wakes up and follows commands.   HEENT: [place]/AT, eyes anicteric.  ETT and OGT in place.  Cervical aspen collar in place Neuro: Sedated, follows commands.  Both pupils equal sluggish reaction to light. Chest: Coarse breath sounds, no wheezes or rhonchi, no secretions via ETT Heart: Regular rate and rhythm, no murmurs or gallops Abdomen: Soft, nondistended, bowel sounds present  Patient Lines/Drains/Airways Status     Active Line/Drains/Airways     Name Placement date Placement time Site Days   Peripheral IV 07/16/24 20 G 1 Left Antecubital 07/16/24  1956  Antecubital  1   Peripheral IV 07/17/24 18 G Right Forearm 07/17/24  1227  Forearm  less than 1   CVC Double Lumen 07/17/24 Right Internal jugular 07/17/24  --  -- less than 1   Urethral Catheter Jamie Aydelette RN Latex 16 Fr. 07/17/24  1230  Latex  less than 1   Airway 7.5 mm 07/17/24  1226  -- less than 1   Wound 07/17/24 1413 Surgical Closed Surgical Incision Neck 07/17/24  1413  Neck  less than 1         Resolved problem list   Assessment and Plan  Status post multiple times PEA cardiac arrest likely in the setting of massive PE Acute biventricular HFrEF  by bedside echo Shock, suspect mix cardiogenic/vasodilatory -- CTA Chest: 10/26: extensive pulmonary artery thrombus bilaterally with right heart strain, small pleural effusion -Status post mechanical thrombectomy on 07/18/2024 -Lactic acidosis has resolved this morning is 1.4 Plan:  --  Echocardiogram is pending -- Systemic anticoagulation:  IV heparin  infusion -- Vasopressors being weaned off currently on 5 of levo. --Status post cooling protocol - Propofol  and fentanyl  on hold. - SAT and SBT today.  Extubate if he passes.   Febrile, possible sepsis component Possible septic shock improving Pneumonia --Febrile with leukocytosis Plan: -- Infectious work up: CTA noted to have bilateral chest infiltrates as well as concern for fat stranding around pancreas. -- Lipase added, UA pending, LFT's added, respiratory culture, blood culture. -MRSA nares negative respiratory pathogen panel negative blood cultures no growth to date UA is negative - Currently on cefepime to continue vancomycin is stopped.  Acute respiratory failure with hypoxia and hypercapnia Rib fractures: T6 on right, T5/6 on left Mixed metabolic and respiratory acidosis --Intubated 10/26 --VAP bundle -SAT and SBT today hold propofol  and fentanyl  for sedation.  Fall with C7-T1 spinal cord injury s/p aborted procedure due to arrest --Aspen collar per NSGY --anterior neck dressing with small amount of blood, unclear if this is from CVC placement. Monitor closely  Diabetes type 2 --Uncontrolled hyperglycemia, has resolved - Will start SSI and use basal insulin  if need be as we transition him off of IV insulin   Acute kidney injury due to ischemic ATN -- Baseline creatinine: 1 -- Creatinine went up to 4.18. -Discussed with nephrology no urgent indication for RRT at this stage. -Monitor on IV fluids for the next 24 to 72 hours. -We shall repeat a BMP at 2 PM today. -- Foley for strict I/O  Post arrest neurological exam -- Patient is waking and moving upper extremities. - Will do a full neuroexam after extubation.  On heparin  gtt If he remains intubated we should start tube feeds.  Wife  updated at bedside.   Labs   CBC: Recent Labs  Lab 07/16/24 1959 07/17/24 0425 07/17/24 1451 07/17/24 1453  07/17/24 2140 07/17/24 2145 07/18/24 0417 07/18/24 0425 07/18/24 1616 07/18/24 1707 07/18/24 1815 07/18/24 1949 07/19/24 0113 07/19/24 0155  WBC 12.0*   < > 12.0*  --  22.4*  --  16.1*  --  16.4*  --   --   --   --  14.0*  NEUTROABS 10.3*  --   --   --   --   --   --   --   --   --   --   --   --   --   HGB 14.7   < > 10.5*   < > 13.4   < > 12.9*   < > 10.5* 8.2* 8.8* 8.5* 7.1* 9.0*  HCT 40.6   < > 31.5*   < > 38.5*   < > 36.8*   < > 29.6* 24.0* 26.0* 25.0* 21.0* 26.3*  MCV 89.6   < > 95.7  --  92.5  --  90.9  --  89.4  --   --   --   --  92.6  PLT 253   < > 86*  --  211  --  199  --  125*  --   --   --   --  122*   < > = values in this interval not displayed.    Basic Metabolic Panel: Recent Labs  Lab 07/17/24 0425 07/17/24 1352 07/17/24 1451 07/17/24 1453 07/17/24 2140 07/17/24 2145 07/18/24 0417 07/18/24 0425 07/18/24 1616 07/18/24 1707 07/18/24 1815 07/18/24 1949 07/19/24 0113 07/19/24 0155  NA 135   < > 137   < > 138   < > 134*   < > 138 137 137 136 140 135  K 3.6   < > 3.4*   < > 4.1   < > 3.7   < > 3.2* 3.0* 3.0* 3.2* 3.3* 3.8  CL 98  --  101  --  98  --  100  --  103  --   --   --   --  100  CO2 24  --  18*  --  19*  --  19*  --  19*  --   --   --   --  22  GLUCOSE 180*  --  286*  --  393*  --  372*  --  144*  --   --   --   --  137*  BUN 22  --  25*  --  35*  --  41*  --  50*  --   --   --   --  56*  CREATININE 0.99  --  1.50*  --  2.37*  --  2.90*  --  3.69*  --   --   --   --  4.18*  CALCIUM 9.0  --  7.8*  --  9.3  --  8.6*  --  8.3*  --   --   --   --  8.0*  MG 2.2  --   --   --  2.0  --  1.8  --  1.9  --   --   --   --   --   PHOS 3.4  --   --   --   --   --   --   --   --   --   --   --   --   --    < > = values in this interval not displayed.   GFR: Estimated Creatinine Clearance: 17 mL/min (A) (by C-G formula based on SCr of 4.18 mg/dL (H)). Recent Labs  Lab 07/17/24 1756 07/17/24 2140 07/18/24 0417 07/18/24 1616 07/19/24 0155  WBC  --  22.4*  16.1* 16.4* 14.0*  LATICACIDVEN 8.6*  --  4.6* 3.2* 1.4    Liver Function Tests: Recent Labs  Lab 07/16/24 1959 07/18/24 0417 07/19/24 0155  AST 46* 1,476* 622*  ALT 20 978* 673*  ALKPHOS 103 58 50  BILITOT 0.7 1.3* 2.3*  PROT 7.3 4.3* 3.9*  ALBUMIN 4.3 2.1* 1.9*   Recent Labs  Lab 07/18/24 0417  LIPASE 29   No results for input(s): AMMONIA in the last 168 hours.  ABG    Component Value Date/Time   PHART 7.461 (H) 07/19/2024 0113   PCO2ART 29.2 (L) 07/19/2024 0113   PO2ART 158 (H) 07/19/2024 0113   HCO3 20.8 07/19/2024 0113   TCO2 22 07/19/2024 0113   ACIDBASEDEF 3.0 (H) 07/19/2024 0113   O2SAT 100 07/19/2024 0113     Coagulation Profile: Recent Labs  Lab 07/17/24 0425 07/17/24 1451 07/17/24 2140  INR 1.0 1.7* 1.7*  Cardiac Enzymes: Recent Labs  Lab 07/16/24 1959 07/17/24 0425  CKTOTAL 1,321* 938*    HbA1C: Hemoglobin A1C  Date/Time Value Ref Range Status  07/23/2021 12:00 AM 11.9  Final  10/27/2020 12:00 AM 9.3  Final   HbA1c, POC (controlled diabetic range)  Date/Time Value Ref Range Status  11/19/2021 11:44 AM 10.2 (A) 0.0 - 7.0 % Final   HbA1c POC (<> result, manual entry)  Date/Time Value Ref Range Status  03/18/2022 03:21 PM 10.8 4.0 - 5.6 % Final   Hgb A1c MFr Bld  Date/Time Value Ref Range Status  07/17/2024 04:25 AM 12.3 (H) 4.8 - 5.6 % Final    Comment:    (NOTE) Diagnosis of Diabetes The following HbA1c ranges recommended by the American Diabetes Association (ADA) may be used as an aid in the diagnosis of diabetes mellitus.  Hemoglobin             Suggested A1C NGSP%              Diagnosis  <5.7                   Non Diabetic  5.7-6.4                Pre-Diabetic  >6.4                   Diabetic  <7.0                   Glycemic control for                       adults with diabetes.    12/02/2022 11:30 AM 11.9 (H) 4.8 - 5.6 % Final    Comment:    (NOTE)         Prediabetes: 5.7 - 6.4         Diabetes: >6.4          Glycemic control for adults with diabetes: <7.0     CBG: Recent Labs  Lab 07/19/24 0200 07/19/24 0338 07/19/24 0500 07/19/24 0650 07/19/24 0839  GLUCAP 138* 114* 128* 111* 173*    Review of Systems:   Unable to obtain as patient is intubated and sedated  Past Medical History:  He,  has a past medical history of Arthritis, Diabetes mellitus without complication (HCC), GERD (gastroesophageal reflux disease), Hypertension, and Sleep apnea.   Surgical History:   Past Surgical History:  Procedure Laterality Date   ANTERIOR CERVICAL DECOMP/DISCECTOMY FUSION N/A 07/17/2024   Procedure: ANTERIOR CERVICAL DECOMPRESSION/DISCECTOMY FUSION, CERVICAL SEVEN - THORACIC ONE;  Surgeon: Debby Dorn MATSU, MD;  Location: [facility] OR;  Service: Neurosurgery;  Laterality: N/A;  Partial Discecomy Cervical seven- Thoracic one   APPENDECTOMY     78 yo   BACK SURGERY  1975   lumbar diskectomy   done [facility]   COLONOSCOPY N/A 02/21/2015   Procedure: COLONOSCOPY;  Surgeon: Claudis RAYMOND Rivet, MD;  Location: [facility] SU[facility];  Service: Endoscopy;  Laterality: N/A;  1025   COLONOSCOPY  11/13/2021   COLONOSCOPY WITH PROPOFOL  N/A 11/13/2021   Procedure: COLONOSCOPY WITH PROPOFOL ;  Surgeon: Rivet Claudis RAYMOND, MD;  Location: [facility];  Service: Endoscopy;  Laterality: N/A;  155   CYSTOSCOPY WITH URETHRAL DILATATION N/A 12/09/2022   Procedure: CYSTOSCOPY WITH URETHRAL DILATATION;  Surgeon: Watt Rush, MD;  Location: [facility] ORS;  Service: Urology;  Laterality: N/A;   EYE SURGERY Bilateral    Lasik surgery  IR ANGIOGRAM PULMONARY BILATERAL SELECTIVE  07/18/2024   IR ANGIOGRAM SELECTIVE EACH ADDITIONAL VESSEL  07/18/2024   IR THROMBECT PRIM MECH ADD (INCLU) MOD SED  07/18/2024   IR THROMBECT PRIM MECH ADD (INCLU) MOD SED  07/18/2024   IR THROMBECT PRIM MECH ADD (INCLU) MOD SED  07/18/2024   LEFT HEART CATH AND CORONARY ANGIOGRAPHY N/A 12/23/2020   Procedure: LEFT HEART CATH AND CORONARY ANGIOGRAPHY;   Surgeon: Burnard Debby LABOR, MD;  Location: [facility] INVASIVE CV LAB;  Service: Cardiovascular;  Laterality: N/A;   NECK SURGERY  1999   Work injury, posterior fusion   POLYPECTOMY  11/13/2021   Procedure: POLYPECTOMY INTESTINAL;  Surgeon: Golda Claudis PENNER, MD;  Location: [facility];  Service: Endoscopy;;   SHOULDER SURGERY Bilateral 2000   rotoator cuff repair   done at [facility]   TONSILLECTOMY     TRANSURETHRAL INCISION OF PROSTATE N/A 12/09/2022   Procedure: TRANSURETHRAL RESECTION OF PROSTATE;  Surgeon: Watt Rush, MD;  Location: [facility] ORS;  Service: Urology;  Laterality: N/A;  1 HR FOR CASE     Social History:   reports that he has never smoked. He has never used smokeless tobacco. He reports that he does not drink alcohol and does not use drugs.   Family History:  His family history is not on file.   Allergies Allergies  Allergen Reactions   Penicillins Hives   Lucentis [Ranibizumab] Rash     Home Medications  Prior to Admission medications   Medication Sig Start Date End Date Taking? Authorizing Provider  Apple Cider Vinegar 600 MG CAPS Take 600 mg by mouth daily.   Yes [provider]  aspirin  EC 81 MG tablet Take 1 tablet (81 mg total) by mouth every other day. 11/14/21  Yes Rehman, Claudis PENNER, MD  Barberry-Oreg Grape-Goldenseal (BERBERINE COMPLEX PO) Take 1 capsule by mouth daily.   Yes [provider]  cholecalciferol (VITAMIN D3) 25 MCG (1000 UNIT) tablet Take 1,000 Units by mouth daily.   Yes [provider]  Cinnamon 500 MG capsule Take 500 mg by mouth daily.   Yes [provider]  diphenhydrAMINE HCl (NERVINE PO) Take 1 tablet by mouth daily. Patient states that he takes one a day   Yes [provider]  empagliflozin (JARDIANCE) 10 MG TABS tablet Take 10 mg by mouth daily.   Yes [provider]  ferrous sulfate 325 (65 FE) MG tablet Take 325 mg by mouth daily with breakfast.   Yes [provider]   hydrochlorothiazide  (HYDRODIURIL ) 25 MG tablet Take 25 mg by mouth daily. 07/23/21  Yes [provider]  insulin  degludec (TRESIBA  FLEXTOUCH) 100 UNIT/ML FlexTouch Pen Inject 12 Units into the skin at bedtime. Patient taking differently: Inject 28 Units into the skin 2 (two) times daily. 10/07/22  Yes Reardon, Benton PARAS, NP  Multiple Vitamins-Minerals (OCUVITE ADULT 50+ PO) Take 1 tablet by mouth daily.   Yes [provider]  OVER THE COUNTER MEDICATION Take 1 capsule by mouth daily. Prostagenix   Yes [provider]  OVER THE COUNTER MEDICATION Take 1 capsule by mouth daily. Diabacore (Diabetic Supplement)   Yes [provider]  OVER THE COUNTER MEDICATION Take 1 tablet by mouth in the morning and at bedtime. Glucoceil   Yes [provider]  tadalafil  (CIALIS ) 5 MG tablet Take 1 tablet (5 mg total) by mouth daily as needed for erectile dysfunction. 12/06/20  Yes Wrenn, John, MD  vitamin E 180 MG (400  UNITS) capsule Take 400 Units by mouth daily.   Yes [provider]  Vitamin Mixture (ESTER-C PO) Take 1,000 mg by mouth daily.   Yes [provider]     Critical care time:      CCT: 35 min    Tamela Stakes, MD  Attending Physician, Critical Care Medicine [facility] See Amion for pager If no response to pager, please call [phone] until 7pm After 7pm, Please call E-link [phone]

## 2024-07-19 NOTE — Progress Notes (Signed)
  Echocardiogram 2D Echocardiogram has been performed.  Devora Ellouise SAUNDERS 07/19/2024, 10:45 AM

## 2024-07-19 NOTE — Inpatient Diabetes Management (Signed)
 Inpatient Diabetes Program Recommendations  AACE/ADA: New Consensus Statement on Inpatient Glycemic Control   Target Ranges:  Prepandial:   less than 140 mg/dL      Peak postprandial:   less than 180 mg/dL (1-2 hours)      Critically ill patients:  140 - 180 mg/dL   Lab Results  Component Value Date   GLUCAP 173 (H) 07/19/2024   HGBA1C 12.3 (H) 07/17/2024    Latest Reference Range & Units 07/19/24 02:00 07/19/24 03:38 07/19/24 05:00 07/19/24 06:50 07/19/24 08:39  Glucose-Capillary 70 - 99 mg/dL 861 (H) 885 (H) 871 (H) 111 (H) 173 (H)   Review of Glycemic Control  Diabetes history: DM2  Outpatient Diabetes medications:  Farxiga 10mg  daily  Basaglar 15mg  daily  Novolog  3 units TID   Current orders for Inpatient glycemic control:  Transitioned from IV Insulin  to Novolog  0-9 units Q4HRS  Inpatient Diabetes Program Recommendations:   Note: patient was taking Basaglar 15 units daily at home prior to admission.   Please consider starting Semglee 10 units daily.   Thanks,  Lavanda Search, RN, MSN, [facility]  Inpatient Diabetes Coordinator  Pager [phone] (8a-5p)

## 2024-07-19 NOTE — Progress Notes (Incomplete)
 Initial Nutrition Assessment  DOCUMENTATION CODES:      INTERVENTION:  ***   NUTRITION DIAGNOSIS:     related to   as evidenced by  .  ***  GOAL:      ***  MONITOR:      REASON FOR ASSESSMENT:        ASSESSMENT:      ***   NUTRITION - FOCUSED PHYSICAL EXAM:  {RD Focused Exam List:21252}  Diet Order:   Diet Order             Diet NPO time specified Except for: Ice Chips, Sips with Meds  Diet effective now                   EDUCATION NEEDS:      Skin:     Last BM:     Height:   Ht Readings from Last 1 Encounters:  07/17/24 6' 1 (1.854 m)    Weight:   Wt Readings from Last 1 Encounters:  07/19/24 82.3 kg    Ideal Body Weight:     BMI:  Body mass index is 23.94 kg/m.  Estimated Nutritional Needs:   Kcal:     Protein:     Fluid:       ***

## 2024-07-19 NOTE — Progress Notes (Signed)
 PHARMACY - ANTICOAGULATION CONSULT NOTE  Pharmacy Consult for heparin  Indication: pulmonary embolus  Allergies  Allergen Reactions   Penicillins Hives   Lucentis [Ranibizumab] Rash    Patient Measurements: Height: 6' 1 (185.4 cm) Weight: 82.3 kg (181 lb 7 oz) (with artic sun pads) IBW/kg (Calculated) : 79.9 HEPARIN  DW (KG): 79.4  Vital Signs: Temp: 97.9 F (36.6 C) (10/28 0838) BP: 118/55 (10/28 0830) Pulse Rate: 109 (10/28 0838)  Labs: Recent Labs    07/16/24 1959 07/17/24 0425 07/17/24 1352 07/17/24 1451 07/17/24 1453 07/17/24 2140 07/17/24 2145 07/18/24 0417 07/18/24 0425 07/18/24 1616 07/18/24 1707 07/18/24 1949 07/18/24 2002 07/18/24 2306 07/19/24 0113 07/19/24 0155 07/19/24 0800  HGB 14.7 12.4*   < > 10.5*   < > 13.4   < > 12.9*   < > 10.5*   < > 8.5*  --   --  7.1* 9.0*  --   HCT 40.6 35.6*   < > 31.5*   < > 38.5*   < > 36.8*   < > 29.6*   < > 25.0*  --   --  21.0* 26.3*  --   PLT 253 208  --  86*  --  211  --  199  --  125*  --   --   --   --   --  122*  --   APTT  --   --   --  49*  --   --   --   --   --  >200*  --   --  >200*  --   --   --   --   LABPROT  --  14.1  --  20.4*  --  20.9*  --   --   --   --   --   --   --   --   --   --   --   INR  --  1.0  --  1.7*  --  1.7*  --   --   --   --   --   --   --   --   --   --   --   HEPARINUNFRC  --   --   --  <0.10*  --   --    < >  --    < >  --   --   --  >1.10* 0.47  --   --  0.20*  CREATININE 1.08 0.99  --  1.50*  --  2.37*  --  2.90*  --  3.69*  --   --   --   --   --  4.18*  --   CKTOTAL 1,321* 938*  --   --   --   --   --   --   --   --   --   --   --   --   --   --   --    < > = values in this interval not displayed.    Estimated Creatinine Clearance: 17 mL/min (A) (by C-G formula based on SCr of 4.18 mg/dL (H)).   Assessment: JC is a 37 YOM admitted with concern for spinal cord injury. Patient taken to the OR for neurosurgery, procedure complicated by multiple rounds of CPR. Patient  transferred to CVICU for further management. Per MD, concern for pulmonary embolism. Patient not on anticoagulation prior to hospitalization. Pharmacy consulted for heparin  dosing.  CT shows extensive  pulmonary artery thrombus bilaterally with evidence of right heart strain. S/p IR 10/27 for thrombectomy.  Heparin  level 0.2 is subtherapeutic with heparin  running at 700 units/hr. Hgb (9.0) and PLTs (122) are stable. Per RN, no report of pauses, issues with the line, or signs of bleeding. Heparin  levels have normalized after large bolus in IR yesterday.  Goal of Therapy:  Heparin  level 0.3-0.7 units/ml Monitor platelets by anticoagulation protocol: Yes   Plan:  Increase heparin  infusion to 850 units/hr Check 8 hour heparin  level Monitor daily heparin  level, CBC, and signs/symptoms of bleeding   Thank you for allowing pharmacy to be a part of this patient's care.   Nidia Schaffer, PharmD PGY2 Cardiology Pharmacy Resident  Please check AMION for all [facility] Pharmacy phone numbers After 10:00 PM, call Main Pharmacy [phone] 07/19/2024  9:43 AM

## 2024-07-19 NOTE — Procedures (Signed)
 Extubation Procedure Note  Patient Details:   Name: Lawrence [name] DOB: [birth year] MRN: [id number]   Airway Documentation:    Vent end date: 07/19/24 Vent end time: 1309   Evaluation  O2 sats: stable throughout Complications: No apparent complications Patient did tolerate procedure well. Bilateral Breath Sounds: Rhonchi, Diminished   Yes  Positive cuff leak noted. Patient placed on [place] 4L with humidity, no stridor noted. Patient has a weak attempt on the incentive spirometer. RN notified.  Cy RAMAN Criss Pallone 07/19/2024, 1:14 PM

## 2024-07-19 NOTE — Consult Note (Signed)
 Reason for Consult: Renal failure Referring Physician: Dr. Rober  Chief Complaint: Weakness  Assessment/Plan: Renal failure -secondary to ischemic ATN + CIN anuric at this current time with hypotension, PEA arrest x 2, CTA as well as successful thrombectomy. - He definitely does not want long-term dialysis but is willing to give a trial with short-term dialysis to give his kidneys a chance to recover -No absolute indication for dialysis at this current time within the next 24 to 72 hours he may need RRT. -Will follow closely with you  -Monitor Daily I/Os, Daily weight  -Maintain MAP>65 for optimal renal perfusion.  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 Acute respiratory failure -off sedation and alert, and possible extubation trial pending weaning parameters Cardiogenic shock secondary to pulmonary embolus -off epinephrine, still on Levophed by cardiology   HPI: Lawrence [name] is an 78 y.o. male with a history of diabetes, hypertension, BPH status post TURP initially presenting after a ground-level fall about 5 days ago.  Of note over the past few weeks he has had neuropathy in his feet, felt more unsteady and used a cane because of his neuropathy.  Unfortunately he slipped and fell and hit his head on the sink followed by new onset numbness in his legs, inability to sit up and loss of function in his legs as well as urinary incontinence.  He was to have a partial anterior cervical discectomy C7-T1 to stabilize even though he was not likely to regain strength in his lower extremities but during the operative course he developed PEA arrest x 2 with subsequent CT scan showing a massive bilateral PE with poor lung  perfusion.  Patient also had successful thrombectomy performed by VIR removing a large clot burden at the right PA bifurcation as well as a large clot burden in the left lower lung lobe.   Baseline creatinine is approximately 0.9-1.1 (12/02/22) but with his complicated course he has developed renal failure with very poor urine output.  ROS Pertinent items are noted in HPI.  Chemistry and CBC: Creatinine  Date/Time Value Ref Range Status  02/21/2021 12:00 AM 1.2 0.6 - 1.3 Final  10/27/2020 12:00 AM 0.9 0.6 - 1.3 Final   Creatinine, Ser  Date/Time Value Ref Range Status  07/19/2024 01:55 AM 4.18 (H) 0.61 - 1.24 mg/dL Final  89/72/7974 95:83 PM 3.69 (H) 0.61 - 1.24 mg/dL Final  89/72/7974 95:82 AM 2.90 (H) 0.61 - 1.24 mg/dL Final  89/73/7974 90:59 PM 2.37 (H) 0.61 - 1.24 mg/dL Final  89/73/7974 97:48 PM 1.50 (H) 0.61 - 1.24 mg/dL Final  89/73/7974 95:74 AM 0.99 0.61 - 1.24 mg/dL Final  89/74/7974 92:40 PM 1.08 0.61 - 1.24 mg/dL Final  96/87/7975 89:41 AM 1.02 0.61 - 1.24 mg/dL Final  97/79/7976 98:95 PM 0.92 0.61 - 1.24 mg/dL Final  95/96/7977 93:65 PM 0.86 0.61 - 1.24 mg/dL Final   Recent Labs  Lab 07/16/24 1959 07/17/24 0425 07/17/24 1352 07/17/24 1451 07/17/24 1453 07/17/24 2140 07/17/24 2145 07/18/24 0417 07/18/24 0425 07/18/24 1612 07/18/24 1616 07/18/24 1707 07/18/24 1815 07/18/24 1949 07/19/24 0113 07/19/24 0155  NA 132* 135   < > 137   < > 138   < > 134*   < > 137 138 137  137 136 140 135  K 3.7 3.6   < > 3.4*   < > 4.1   < > 3.7   < > 3.1* 3.2* 3.0* 3.0* 3.2* 3.3* 3.8  CL 91* 98  --  101  --  98  --  100  --   --  103  --   --   --   --  100  CO2 30 24  --  18*  --  19*  --  19*  --   --  19*  --   --   --   --  22  GLUCOSE 361* 180*  --  286*  --  393*  --  372*  --   --  144*  --   --   --   --  137*  BUN 24* 22  --  25*  --  35*  --  41*  --   --  50*  --   --   --   --  56*  CREATININE 1.08 0.99  --  1.50*  --  2.37*  --  2.90*  --   --  3.69*  --   --   --    --  4.18*  CALCIUM 10.2 9.0  --  7.8*  --  9.3  --  8.6*  --   --  8.3*  --   --   --   --  8.0*  PHOS  --  3.4  --   --   --   --   --   --   --   --   --   --   --   --   --   --    < > = values in this interval not displayed.   Recent Labs  Lab 07/16/24 1959 07/17/24 0425 07/17/24 2140 07/17/24 2145 07/18/24 0417 07/18/24 0425 07/18/24 1616 07/18/24 1707 07/18/24 1815 07/18/24 1949 07/19/24 0113 07/19/24 0155  WBC 12.0*   < > 22.4*  --  16.1*  --  16.4*  --   --   --   --  14.0*  NEUTROABS 10.3*  --   --   --   --   --   --   --   --   --   --   --   HGB 14.7   < > 13.4   < > 12.9*   < > 10.5*   < > 8.8* 8.5* 7.1* 9.0*  HCT 40.6   < > 38.5*   < > 36.8*   < > 29.6*   < > 26.0* 25.0* 21.0* 26.3*  MCV 89.6   < > 92.5  --  90.9  --  89.4  --   --   --   --  92.6  PLT 253   < > 211  --  199  --  125*  --   --   --   --  122*   < > = values in this interval not displayed.   Liver Function Tests: Recent Labs  Lab 07/16/24 1959 07/18/24 0417 07/19/24 0155  AST 46* 1,476* 622*  ALT 20 978* 673*  ALKPHOS 103 58 50  BILITOT 0.7 1.3* 2.3*  PROT 7.3 4.3* 3.9*  ALBUMIN 4.3 2.1* 1.9*   Recent Labs  Lab 07/18/24 0417  LIPASE 29   No results for input(s): AMMONIA in the last 168 hours. Cardiac Enzymes: Recent Labs  Lab  07/16/24 1959 07/17/24 0425  CKTOTAL 1,321* 938*   Iron Studies: No results for input(s): IRON, TIBC, TRANSFERRIN, FERRITIN in the last 72 hours. PT/INR: @LABRCNTIP (inr:5)  Xrays/Other Studies: ) Results for orders placed or performed during the hospital encounter of 07/16/24 (from the past 48 hours)  Surgical pcr screen     Status: None   Collection Time: 07/17/24 11:28 AM   Specimen: Nasal Mucosa; Nasal Swab  Result Value Ref Range   MRSA, PCR NEGATIVE NEGATIVE   Staphylococcus aureus NEGATIVE NEGATIVE    Comment: (NOTE) The Xpert SA Assay (FDA approved for NASAL specimens in patients 24 years of age and older), is one component of a  comprehensive surveillance program. It is not intended to diagnose infection nor to guide or monitor treatment. Performed at [facility] Lab, [address] [place], [place] [address]   Glucose, capillary     Status: Abnormal   Collection Time: 07/17/24 11:33 AM  Result Value Ref Range   Glucose-Capillary 152 (H) 70 - 99 mg/dL    Comment: Glucose reference range applies only to samples taken after fasting for at least 8 hours.  I-STAT 7, (LYTES, BLD GAS, ICA, H+H)     Status: Abnormal   Collection Time: 07/17/24  1:52 PM  Result Value Ref Range   pH, Arterial 7.029 (LL) 7.35 - 7.45   pCO2 arterial 70.5 (HH) 32 - 48 mmHg   pO2, Arterial 345 (H) 83 - 108 mmHg   Bicarbonate 18.6 (L) 20.0 - 28.0 mmol/L   TCO2 21 (L) 22 - 32 mmol/L   O2 Saturation 100 %   Acid-base deficit 12.0 (H) 0.0 - 2.0 mmol/L   Sodium 136 135 - 145 mmol/L   Potassium 4.5 3.5 - 5.1 mmol/L   Calcium, Ion 1.21 1.15 - 1.40 mmol/L   HCT 30.0 (L) 39.0 - 52.0 %   Hemoglobin 10.2 (L) 13.0 - 17.0 g/dL   Sample type ARTERIAL   CBC     Status: Abnormal   Collection Time: 07/17/24  2:51 PM  Result Value Ref Range   WBC 12.0 (H) 4.0 - 10.5 K/uL   RBC 3.29 (L) 4.22 - 5.81 MIL/uL   Hemoglobin 10.5 (L) 13.0 - 17.0 g/dL   HCT 68.4 (L) 60.9 - 47.9 %   MCV 95.7 80.0 - 100.0 fL   MCH 31.9 26.0 - 34.0 pg   MCHC 33.3 30.0 - 36.0 g/dL   RDW 88.5 (L) 88.4 - 84.4 %   Platelets 86 (L) 150 - 400 K/uL    Comment: PLATELET COUNT CONFIRMED BY SMEAR REPEATED TO VERIFY SPECIMEN CHECKED FOR CLOTS DELTA CHECK NOTED    nRBC 0.0 0.0 - 0.2 %    Comment: Performed at [facility], [address], [place], [place] [address]  Basic metabolic panel     Status: Abnormal   Collection Time: 07/17/24  2:51 PM  Result Value Ref Range   Sodium 137 135 - 145 mmol/L   Potassium 3.4 (L) 3.5 - 5.1 mmol/L   Chloride 101 98 - 111 mmol/L   CO2 18 (L) 22 - 32 mmol/L   Glucose, Bld 286 (H) 70 - 99 mg/dL    Comment: Glucose reference range  applies only to samples taken after fasting for at least 8 hours.   BUN 25 (H) 8 - 23 mg/dL   Creatinine, Ser 8.49 (H) 0.61 - 1.24 mg/dL   Calcium 7.8 (L) 8.9 - 10.3 mg/dL   GFR, Estimated 48 (L) >  60 mL/min    Comment: (NOTE) Calculated using the CKD-EPI Creatinine Equation (2021)    Anion gap 18 (H) 5 - 15    Comment: Performed at [facility], [address] [place], [place] [address]  Heparin  level (unfractionated)     Status: Abnormal   Collection Time: 07/17/24  2:51 PM  Result Value Ref Range   Heparin  Unfractionated <0.10 (L) 0.30 - 0.70 IU/mL    Comment: (NOTE) The clinical reportable range upper limit is being lowered to >1.10 to align with the FDA approved guidance for the current laboratory assay.  If heparin  results are below expected values, and patient dosage has  been confirmed, suggest follow up testing of antithrombin III levels. Performed at [facility], [address] [place], [place] [address]   APTT     Status: Abnormal   Collection Time: 07/17/24  2:51 PM  Result Value Ref Range   aPTT 49 (H) 24 - 36 seconds    Comment:        IF BASELINE aPTT IS ELEVATED, SUGGEST PATIENT RISK ASSESSMENT BE USED TO DETERMINE APPROPRIATE ANTICOAGULANT THERAPY. Performed at [facility], [address] [place], [place] [address]   Protime-INR     Status: Abnormal   Collection Time: 07/17/24  2:51 PM  Result Value Ref Range   Prothrombin Time 20.4 (H) 11.4 - 15.2 seconds   INR 1.7 (H) 0.8 - 1.2    Comment: (NOTE) INR goal varies based on device and disease states. Performed at [facility], [address], [place], [place] [address]   Glucose, capillary     Status: Abnormal   Collection Time: 07/17/24  2:53 PM  Result Value Ref Range   Glucose-Capillary 285 (H) 70 - 99 mg/dL    Comment: Glucose reference range applies only to samples taken after fasting for at least 8 hours.  I-STAT 7, (LYTES, BLD GAS, ICA, H+H)     Status: Abnormal    Collection Time: 07/17/24  2:53 PM  Result Value Ref Range   pH, Arterial 7.193 (LL) 7.35 - 7.45   pCO2 arterial 47.4 32 - 48 mmHg   pO2, Arterial 284 (H) 83 - 108 mmHg   Bicarbonate 18.9 (L) 20.0 - 28.0 mmol/L   TCO2 20 (L) 22 - 32 mmol/L   O2 Saturation 100 %   Acid-base deficit 10.0 (H) 0.0 - 2.0 mmol/L   Sodium 137 135 - 145 mmol/L   Potassium 3.3 (L) 3.5 - 5.1 mmol/L   Calcium, Ion 1.12 (L) 1.15 - 1.40 mmol/L   HCT 28.0 (L) 39.0 - 52.0 %   Hemoglobin 9.5 (L) 13.0 - 17.0 g/dL   Patient temperature 05.9 F    Sample type ARTERIAL    Comment NOTIFIED PHYSICIAN   CG4 I-STAT (Lactic acid)     Status: Abnormal   Collection Time: 07/17/24  2:59 PM  Result Value Ref Range   Lactic Acid, Venous 11.6 (HH) 0.5 - 1.9 mmol/L   Comment NOTIFIED PHYSICIAN   I-STAT 7, (LYTES, BLD GAS, ICA, H+H)     Status: Abnormal   Collection Time: 07/17/24  3:12 PM  Result Value Ref Range   pH, Arterial 7.305 (L) 7.35 - 7.45   pCO2 arterial 50.4 (H) 32 - 48 mmHg   pO2, Arterial 314 (H) 83 - 108 mmHg   Bicarbonate 25.9 20.0 - 28.0 mmol/L   TCO2 28 22 - 32 mmol/L   O2 Saturation 100 %  Acid-base deficit 1.0 0.0 - 2.0 mmol/L   Sodium 139 135 - 145 mmol/L   Potassium 4.2 3.5 - 5.1 mmol/L   Calcium, Ion 1.98 (HH) 1.15 - 1.40 mmol/L   HCT 27.0 (L) 39.0 - 52.0 %   Hemoglobin 9.2 (L) 13.0 - 17.0 g/dL   Patient temperature 65.5 C    Sample type ARTERIAL   Glucose, capillary     Status: Abnormal   Collection Time: 07/17/24  5:54 PM  Result Value Ref Range   Glucose-Capillary 303 (H) 70 - 99 mg/dL    Comment: Glucose reference range applies only to samples taken after fasting for at least 8 hours.  CG4 I-STAT (Lactic acid)     Status: Abnormal   Collection Time: 07/17/24  5:56 PM  Result Value Ref Range   Lactic Acid, Venous 8.6 (HH) 0.5 - 1.9 mmol/L   Comment NOTIFIED PHYSICIAN   Glucose, capillary     Status: Abnormal   Collection Time: 07/17/24  7:20 PM  Result Value Ref Range   Glucose-Capillary  263 (H) 70 - 99 mg/dL    Comment: Glucose reference range applies only to samples taken after fasting for at least 8 hours.  CBC     Status: Abnormal   Collection Time: 07/17/24  9:40 PM  Result Value Ref Range   WBC 22.4 (H) 4.0 - 10.5 K/uL   RBC 4.16 (L) 4.22 - 5.81 MIL/uL   Hemoglobin 13.4 13.0 - 17.0 g/dL    Comment: REPEATED TO VERIFY   HCT 38.5 (L) 39.0 - 52.0 %   MCV 92.5 80.0 - 100.0 fL   MCH 32.2 26.0 - 34.0 pg   MCHC 34.8 30.0 - 36.0 g/dL   RDW 88.6 (L) 88.4 - 84.4 %   Platelets 211 150 - 400 K/uL    Comment: DELTA CHECK NOTED REPEATED TO VERIFY    nRBC 0.0 0.0 - 0.2 %    Comment: Performed at [facility], [address] [place], [place] [address]  Protime-INR     Status: Abnormal   Collection Time: 07/17/24  9:40 PM  Result Value Ref Range   Prothrombin Time 20.9 (H) 11.4 - 15.2 seconds   INR 1.7 (H) 0.8 - 1.2    Comment: (NOTE) INR goal varies based on device and disease states. Performed at [facility], [address] [place], [place] [address]   Basic metabolic panel     Status: Abnormal   Collection Time: 07/17/24  9:40 PM  Result Value Ref Range   Sodium 138 135 - 145 mmol/L   Potassium 4.1 3.5 - 5.1 mmol/L   Chloride 98 98 - 111 mmol/L   CO2 19 (L) 22 - 32 mmol/L   Glucose, Bld 393 (H) 70 - 99 mg/dL    Comment: Glucose reference range applies only to samples taken after fasting for at least 8 hours.   BUN 35 (H) 8 - 23 mg/dL   Creatinine, Ser 7.62 (H) 0.61 - 1.24 mg/dL   Calcium 9.3 8.9 - 89.6 mg/dL   GFR, Estimated 28 (L) >60 mL/min    Comment: (NOTE) Calculated using the CKD-EPI Creatinine Equation (2021)    Anion gap 21 (H) 5 - 15    Comment: ELECTROLYTES REPEATED TO VERIFY Performed at [facility], [address] [place], [place] [address]   Magnesium     Status: None   Collection Time: 07/17/24  9:40 PM  Result Value Ref Range  Magnesium 2.0 1.7 - 2.4 mg/dL    Comment: Performed at [facility], [address] [place], [place] [address]  ABO/Rh     Status: None   Collection Time: 07/17/24  9:40 PM  Result Value Ref Range   ABO/RH(D)      O POS Performed at [facility], [address], [place], [place] [address]   I-STAT 7, (LYTES, BLD GAS, ICA, H+H)     Status: Abnormal   Collection Time: 07/17/24  9:45 PM  Result Value Ref Range   pH, Arterial 7.358 7.35 - 7.45   pCO2 arterial 33.4 32 - 48 mmHg   pO2, Arterial 105 83 - 108 mmHg   Bicarbonate 19.0 (L) 20.0 - 28.0 mmol/L   TCO2 20 (L) 22 - 32 mmol/L   O2 Saturation 98 %   Acid-base deficit 6.0 (H) 0.0 - 2.0 mmol/L   Sodium 138 135 - 145 mmol/L   Potassium 3.5 3.5 - 5.1 mmol/L   Calcium, Ion 1.24 1.15 - 1.40 mmol/L   HCT 32.0 (L) 39.0 - 52.0 %   Hemoglobin 10.9 (L) 13.0 - 17.0 g/dL   Patient temperature 03.4 F    Sample type ARTERIAL   Glucose, capillary     Status: Abnormal   Collection Time: 07/17/24 11:11 PM  Result Value Ref Range   Glucose-Capillary 413 (H) 70 - 99 mg/dL    Comment: Glucose reference range applies only to samples taken after fasting for at least 8 hours.  Heparin  level (unfractionated)     Status: Abnormal   Collection Time: 07/17/24 11:53 PM  Result Value Ref Range   Heparin  Unfractionated 0.83 (H) 0.30 - 0.70 IU/mL    Comment: (NOTE) The clinical reportable range upper limit is being lowered to >1.10 to align with the FDA approved guidance for the current laboratory assay.  If heparin  results are below expected values, and patient dosage has  been confirmed, suggest follow up testing of antithrombin III levels. Performed at [facility], [address] [place], [place] [address]   Glucose, capillary     Status: Abnormal   Collection Time: 07/18/24 12:35 AM  Result Value Ref Range   Glucose-Capillary 293 (H) 70 - 99 mg/dL    Comment: Glucose reference range applies only to samples taken after fasting for at least 8 hours.  Glucose, capillary     Status: Abnormal   Collection Time:  07/18/24  1:46 AM  Result Value Ref Range   Glucose-Capillary 329 (H) 70 - 99 mg/dL    Comment: Glucose reference range applies only to samples taken after fasting for at least 8 hours.  Glucose, capillary     Status: Abnormal   Collection Time: 07/18/24  2:50 AM  Result Value Ref Range   Glucose-Capillary 292 (H) 70 - 99 mg/dL    Comment: Glucose reference range applies only to samples taken after fasting for at least 8 hours.  Type and screen     Status: None   Collection Time: 07/18/24  4:17 AM  Result Value Ref Range   ABO/RH(D) O POS    Antibody Screen NEG    Sample Expiration      07/21/2024,2359 Performed at [facility], [address] [place], [place] [address]   CBC     Status: Abnormal   Collection Time: 07/18/24  4:17 AM  Result Value Ref Range   WBC 16.1 (H) 4.0 - 10.5 K/uL   RBC  4.05 (L) 4.22 - 5.81 MIL/uL   Hemoglobin 12.9 (L) 13.0 - 17.0 g/dL   HCT 63.1 (L) 60.9 - 47.9 %   MCV 90.9 80.0 - 100.0 fL   MCH 31.9 26.0 - 34.0 pg   MCHC 35.1 30.0 - 36.0 g/dL   RDW 88.6 (L) 88.4 - 84.4 %   Platelets 199 150 - 400 K/uL   nRBC 0.0 0.0 - 0.2 %    Comment: Performed at [facility], [address], [place], [place] [address]  Basic metabolic panel     Status: Abnormal   Collection Time: 07/18/24  4:17 AM  Result Value Ref Range   Sodium 134 (L) 135 - 145 mmol/L   Potassium 3.7 3.5 - 5.1 mmol/L   Chloride 100 98 - 111 mmol/L   CO2 19 (L) 22 - 32 mmol/L   Glucose, Bld 372 (H) 70 - 99 mg/dL    Comment: Glucose reference range applies only to samples taken after fasting for at least 8 hours.   BUN 41 (H) 8 - 23 mg/dL   Creatinine, Ser 7.09 (H) 0.61 - 1.24 mg/dL   Calcium 8.6 (L) 8.9 - 10.3 mg/dL   GFR, Estimated 22 (L) >60 mL/min    Comment: (NOTE) Calculated using the CKD-EPI Creatinine Equation (2021)    Anion gap 15 5 - 15    Comment: Performed at [facility], [address], [place], [place] [address]  Magnesium     Status: None   Collection  Time: 07/18/24  4:17 AM  Result Value Ref Range   Magnesium 1.8 1.7 - 2.4 mg/dL    Comment: Performed at [facility], [address] [place], [place] [address]  Lactic acid, plasma     Status: Abnormal   Collection Time: 07/18/24  4:17 AM  Result Value Ref Range   Lactic Acid, Venous 4.6 (HH) 0.5 - 1.9 mmol/L    Comment: CRITICAL RESULT CALLED TO, READ BACK BY AND VERIFIED WITH P. SHAFFER RN 07/18/24 0517 [facility] Performed at [facility], [address] [place], [place] [address]   Hepatic function panel     Status: Abnormal   Collection Time: 07/18/24  4:17 AM  Result Value Ref Range   Total Protein 4.3 (L) 6.5 - 8.1 g/dL   Albumin 2.1 (L) 3.5 - 5.0 g/dL   AST 8,523 (H) 15 - 41 U/L   ALT 978 (H) 0 - 44 U/L   Alkaline Phosphatase 58 38 - 126 U/L   Total Bilirubin 1.3 (H) 0.0 - 1.2 mg/dL   Bilirubin, Direct 0.3 (H) 0.0 - 0.2 mg/dL   Indirect Bilirubin 1.0 (H) 0.3 - 0.9 mg/dL    Comment: Performed at [facility], [address] [place], [place] [address]  Lipase, blood     Status: None   Collection Time: 07/18/24  4:17 AM  Result Value Ref Range   Lipase 29 11 - 51 U/L    Comment: Performed at [facility], [address], [place], [place] [address]  Glucose, capillary     Status: Abnormal   Collection Time: 07/18/24  4:22 AM  Result Value Ref Range   Glucose-Capillary 359 (H) 70 - 99 mg/dL    Comment: Glucose reference range applies only to samples taken after fasting for at least 8 hours.  I-STAT 7, (LYTES, BLD GAS, ICA, H+H)     Status: Abnormal   Collection Time: 07/18/24  4:25  AM  Result Value Ref Range   pH, Arterial 7.445 7.35 - 7.45   pCO2 arterial 30.8 (L) 32 - 48 mmHg   pO2, Arterial 123 (H) 83 - 108 mmHg   Bicarbonate 21.1 20.0 - 28.0 mmol/L   TCO2 22 22 - 32 mmol/L   O2 Saturation 99 %   Acid-base deficit 2.0 0.0 - 2.0 mmol/L   Sodium 136 135 - 145 mmol/L   Potassium 3.9 3.5 - 5.1 mmol/L   Calcium, Ion 1.27 1.15 - 1.40 mmol/L    HCT 35.0 (L) 39.0 - 52.0 %   Hemoglobin 11.9 (L) 13.0 - 17.0 g/dL   Patient temperature 00.9 F    Sample type ARTERIAL   Glucose, capillary     Status: Abnormal   Collection Time: 07/18/24  5:54 AM  Result Value Ref Range   Glucose-Capillary 191 (H) 70 - 99 mg/dL    Comment: Glucose reference range applies only to samples taken after fasting for at least 8 hours.  Glucose, capillary     Status: Abnormal   Collection Time: 07/18/24  6:56 AM  Result Value Ref Range   Glucose-Capillary 261 (H) 70 - 99 mg/dL    Comment: Glucose reference range applies only to samples taken after fasting for at least 8 hours.  Glucose, capillary     Status: Abnormal   Collection Time: 07/18/24  8:04 AM  Result Value Ref Range   Glucose-Capillary 237 (H) 70 - 99 mg/dL    Comment: Glucose reference range applies only to samples taken after fasting for at least 8 hours.  Heparin  level (unfractionated)     Status: Abnormal   Collection Time: 07/18/24  8:08 AM  Result Value Ref Range   Heparin  Unfractionated 1.07 (H) 0.30 - 0.70 IU/mL    Comment: (NOTE) The clinical reportable range upper limit is being lowered to >1.10 to align with the FDA approved guidance for the current laboratory assay.  If heparin  results are below expected values, and patient dosage has  been confirmed, suggest follow up testing of antithrombin III levels. Performed at [facility], [address] [place], [place] [address]   Glucose, capillary     Status: Abnormal   Collection Time: 07/18/24  9:12 AM  Result Value Ref Range   Glucose-Capillary 176 (H) 70 - 99 mg/dL    Comment: Glucose reference range applies only to samples taken after fasting for at least 8 hours.  Culture, Respiratory w Gram Stain     Status: None (Preliminary result)   Collection Time: 07/18/24  9:41 AM   Specimen: Tracheal Aspirate; Respiratory  Result Value Ref Range   Specimen Description TRACHEAL ASPIRATE    Special Requests NONE    Gram Stain       ABUNDANT WBC PRESENT, PREDOMINANTLY PMN NO ORGANISMS SEEN Performed at [facility], [address], [place], [place] [address]    Culture PENDING    Report Status PENDING   Glucose, capillary     Status: Abnormal   Collection Time: 07/18/24 10:17 AM  Result Value Ref Range   Glucose-Capillary 138 (H) 70 - 99 mg/dL    Comment: Glucose reference range applies only to samples taken after fasting for at least 8 hours.  Culture, blood (Routine X 2) w Reflex to ID Panel     Status: None (Preliminary result)   Collection Time: 07/18/24 10:51 AM   Specimen: BLOOD RIGHT HAND  Result Value Ref Range   Specimen Description BLOOD  RIGHT HAND    Special Requests      BOTTLES DRAWN AEROBIC AND ANAEROBIC Blood Culture results may not be optimal due to an inadequate volume of blood received in culture bottles   Culture      NO GROWTH < 24 HOURS Performed at [facility], [address], [place], [place] [address]    Report Status PENDING   Glucose, capillary     Status: Abnormal   Collection Time: 07/18/24 11:21 AM  Result Value Ref Range   Glucose-Capillary 133 (H) 70 - 99 mg/dL    Comment: Glucose reference range applies only to samples taken after fasting for at least 8 hours.  Glucose, capillary     Status: Abnormal   Collection Time: 07/18/24 12:26 PM  Result Value Ref Range   Glucose-Capillary 129 (H) 70 - 99 mg/dL    Comment: Glucose reference range applies only to samples taken after fasting for at least 8 hours.  Culture, blood (Routine X 2) w Reflex to ID Panel     Status: None (Preliminary result)   Collection Time: 07/18/24 12:32 PM   Specimen: BLOOD LEFT HAND  Result Value Ref Range   Specimen Description BLOOD LEFT HAND    Special Requests      BOTTLES DRAWN AEROBIC ONLY Blood Culture adequate volume   Culture      NO GROWTH < 24 HOURS Performed at [facility], [address] [place], [place] [address]    Report Status PENDING   Glucose, capillary      Status: Abnormal   Collection Time: 07/18/24  1:32 PM  Result Value Ref Range   Glucose-Capillary 124 (H) 70 - 99 mg/dL    Comment: Glucose reference range applies only to samples taken after fasting for at least 8 hours.  Glucose, capillary     Status: Abnormal   Collection Time: 07/18/24  2:43 PM  Result Value Ref Range   Glucose-Capillary 109 (H) 70 - 99 mg/dL    Comment: Glucose reference range applies only to samples taken after fasting for at least 8 hours.  POCT Activated clotting time     Status: None   Collection Time: 07/18/24  3:07 PM  Result Value Ref Range   Activated Clotting Time 233 seconds    Comment: Reference range 74-137 seconds for patients not on anticoagulant therapy.  I-STAT 7, (LYTES, BLD GAS, ICA, H+H)     Status: Abnormal   Collection Time: 07/18/24  4:12 PM  Result Value Ref Range   pH, Arterial 7.636 (HH) 7.35 - 7.45   pCO2 arterial 18.4 (LL) 32 - 48 mmHg   pO2, Arterial 158 (H) 83 - 108 mmHg   Bicarbonate 20.0 20.0 - 28.0 mmol/L   TCO2 21 (L) 22 - 32 mmol/L   O2 Saturation 100 %   Acid-base deficit 1.0 0.0 - 2.0 mmol/L   Sodium 137 135 - 145 mmol/L   Potassium 3.1 (L) 3.5 - 5.1 mmol/L   Calcium, Ion 1.18 1.15 - 1.40 mmol/L   HCT 28.0 (L) 39.0 - 52.0 %   Hemoglobin 9.5 (L) 13.0 - 17.0 g/dL   Patient temperature 65.6 C    Sample type ARTERIAL   Glucose, capillary     Status: Abnormal   Collection Time: 07/18/24  4:15 PM  Result Value Ref Range   Glucose-Capillary 157 (H) 70 - 99 mg/dL    Comment: Glucose reference range applies only to samples taken after fasting for at least  8 hours.  Lactic acid, plasma     Status: Abnormal   Collection Time: 07/18/24  4:16 PM  Result Value Ref Range   Lactic Acid, Venous 3.2 (HH) 0.5 - 1.9 mmol/L    Comment: CRITICAL VALUE NOTED.  VALUE IS CONSISTENT WITH PREVIOUSLY REPORTED AND CALLED VALUE. Performed at [facility], [address] [place], [place] [address]   CBC     Status: Abnormal    Collection Time: 07/18/24  4:16 PM  Result Value Ref Range   WBC 16.4 (H) 4.0 - 10.5 K/uL   RBC 3.31 (L) 4.22 - 5.81 MIL/uL   Hemoglobin 10.5 (L) 13.0 - 17.0 g/dL   HCT 70.3 (L) 60.9 - 47.9 %   MCV 89.4 80.0 - 100.0 fL   MCH 31.7 26.0 - 34.0 pg   MCHC 35.5 30.0 - 36.0 g/dL   RDW 88.6 (L) 88.4 - 84.4 %   Platelets 125 (L) 150 - 400 K/uL   nRBC 0.0 0.0 - 0.2 %    Comment: Performed at [facility], [address], [place], [place] [address]  Basic metabolic panel     Status: Abnormal   Collection Time: 07/18/24  4:16 PM  Result Value Ref Range   Sodium 138 135 - 145 mmol/L   Potassium 3.2 (L) 3.5 - 5.1 mmol/L   Chloride 103 98 - 111 mmol/L   CO2 19 (L) 22 - 32 mmol/L   Glucose, Bld 144 (H) 70 - 99 mg/dL    Comment: Glucose reference range applies only to samples taken after fasting for at least 8 hours.   BUN 50 (H) 8 - 23 mg/dL   Creatinine, Ser 6.30 (H) 0.61 - 1.24 mg/dL   Calcium 8.3 (L) 8.9 - 10.3 mg/dL   GFR, Estimated 16 (L) >60 mL/min    Comment: (NOTE) Calculated using the CKD-EPI Creatinine Equation (2021)    Anion gap 16 (H) 5 - 15    Comment: Performed at [facility], [address], [place], [place] [address]  Magnesium     Status: None   Collection Time: 07/18/24  4:16 PM  Result Value Ref Range   Magnesium 1.9 1.7 - 2.4 mg/dL    Comment: Performed at [facility], [address] [place], [place] [address]  APTT     Status: Abnormal   Collection Time: 07/18/24  4:16 PM  Result Value Ref Range   aPTT >200 (HH) 24 - 36 seconds    Comment:        IF BASELINE aPTT IS ELEVATED, SUGGEST PATIENT RISK ASSESSMENT BE USED TO DETERMINE APPROPRIATE ANTICOAGULANT THERAPY. REPEATED TO VERIFY CRITICAL RESULT CALLED TO, READ BACK BY AND VERIFIED WITH: GAITHER SQUIBB., RN @ [phone] 07/18/2024 SORTOG Performed at [facility], [address], [place], [place] [address]   Urinalysis, Routine w reflex microscopic -Urine, Clean Catch     Status: Abnormal    Collection Time: 07/18/24  5:07 PM  Result Value Ref Range   Color, Urine YELLOW YELLOW   APPearance TURBID (A) CLEAR   Specific Gravity, Urine 1.026 1.005 - 1.030   pH 5.0 5.0 - 8.0   Glucose, UA >=500 (A) NEGATIVE mg/dL   Hgb urine dipstick MODERATE (A) NEGATIVE   Bilirubin Urine NEGATIVE NEGATIVE   Ketones, ur NEGATIVE NEGATIVE mg/dL   Protein, ur 899 (A) NEGATIVE mg/dL   Nitrite NEGATIVE NEGATIVE   Leukocytes,Ua NEGATIVE NEGATIVE   RBC / HPF >50  0 - 5 RBC/hpf   WBC, UA 11-20 0 - 5 WBC/hpf   Bacteria, UA MANY (A) NONE SEEN   Squamous Epithelial / HPF 0-5 0 - 5 /HPF   Amorphous Crystal PRESENT    Non Squamous Epithelial 6-10 (A) NONE SEEN   Sperm, UA PRESENT     Comment: Performed at [facility], [address] [place], [place] [address]  I-STAT 7, (LYTES, BLD GAS, ICA, H+H)     Status: Abnormal   Collection Time: 07/18/24  5:07 PM  Result Value Ref Range   pH, Arterial 7.636 (HH) 7.35 - 7.45   pCO2 arterial 19.7 (LL) 32 - 48 mmHg   pO2, Arterial 130 (H) 83 - 108 mmHg   Bicarbonate 21.4 20.0 - 28.0 mmol/L   TCO2 22 22 - 32 mmol/L   O2 Saturation 100 %   Acid-Base Excess 0.0 0.0 - 2.0 mmol/L   Sodium 137 135 - 145 mmol/L   Potassium 3.0 (L) 3.5 - 5.1 mmol/L   Calcium, Ion 1.17 1.15 - 1.40 mmol/L   HCT 24.0 (L) 39.0 - 52.0 %   Hemoglobin 8.2 (L) 13.0 - 17.0 g/dL   Patient temperature 65.6 C    Sample type ARTERIAL    Comment NOTIFIED PHYSICIAN   Glucose, capillary     Status: Abnormal   Collection Time: 07/18/24  5:27 PM  Result Value Ref Range   Glucose-Capillary 126 (H) 70 - 99 mg/dL    Comment: Glucose reference range applies only to samples taken after fasting for at least 8 hours.  I-STAT 7, (LYTES, BLD GAS, ICA, H+H)     Status: Abnormal   Collection Time: 07/18/24  6:15 PM  Result Value Ref Range   pH, Arterial 7.622 (HH) 7.35 - 7.45   pCO2 arterial 22.1 (L) 32 - 48 mmHg   pO2, Arterial 124 (H) 83 - 108 mmHg   Bicarbonate 23.5 20.0 - 28.0 mmol/L    TCO2 24 22 - 32 mmol/L   O2 Saturation 100 %   Acid-Base Excess 2.0 0.0 - 2.0 mmol/L   Sodium 137 135 - 145 mmol/L   Potassium 3.0 (L) 3.5 - 5.1 mmol/L   Calcium, Ion 1.16 1.15 - 1.40 mmol/L   HCT 26.0 (L) 39.0 - 52.0 %   Hemoglobin 8.8 (L) 13.0 - 17.0 g/dL   Patient temperature 66.6 C    Sample type ARTERIAL    Comment NOTIFIED PHYSICIAN   Glucose, capillary     Status: Abnormal   Collection Time: 07/18/24  7:02 PM  Result Value Ref Range   Glucose-Capillary 120 (H) 70 - 99 mg/dL    Comment: Glucose reference range applies only to samples taken after fasting for at least 8 hours.  I-STAT 7, (LYTES, BLD GAS, ICA, H+H)     Status: Abnormal   Collection Time: 07/18/24  7:49 PM  Result Value Ref Range   pH, Arterial 7.510 (H) 7.35 - 7.45   pCO2 arterial 28.8 (L) 32 - 48 mmHg   pO2, Arterial 147 (H) 83 - 108 mmHg   Bicarbonate 23.4 20.0 - 28.0 mmol/L   TCO2 24 22 - 32 mmol/L   O2 Saturation 100 %   Acid-Base Excess 0.0 0.0 - 2.0 mmol/L   Sodium 136 135 - 145 mmol/L   Potassium 3.2 (L) 3.5 - 5.1 mmol/L   Calcium, Ion 1.19 1.15 - 1.40 mmol/L   HCT 25.0 (L) 39.0 - 52.0 %   Hemoglobin 8.5 (L) 13.0 -  17.0 g/dL   Patient temperature 64.9 C    Collection site art line    Drawn by RT    Sample type ARTERIAL   Respiratory (~20 pathogens) panel by PCR     Status: None   Collection Time: 07/18/24  8:02 PM   Specimen: Nasopharyngeal Swab; Respiratory  Result Value Ref Range   Adenovirus NOT DETECTED NOT DETECTED   Coronavirus 229E NOT DETECTED NOT DETECTED    Comment: (NOTE) The Coronavirus on the Respiratory Panel, DOES NOT test for the novel  Coronavirus (2019 nCoV)    Coronavirus HKU1 NOT DETECTED NOT DETECTED   Coronavirus NL63 NOT DETECTED NOT DETECTED   Coronavirus OC43 NOT DETECTED NOT DETECTED   Metapneumovirus NOT DETECTED NOT DETECTED   Rhinovirus / Enterovirus NOT DETECTED NOT DETECTED   Influenza A NOT DETECTED NOT DETECTED   Influenza B NOT DETECTED NOT DETECTED    Parainfluenza Virus 1 NOT DETECTED NOT DETECTED   Parainfluenza Virus 2 NOT DETECTED NOT DETECTED   Parainfluenza Virus 3 NOT DETECTED NOT DETECTED   Parainfluenza Virus 4 NOT DETECTED NOT DETECTED   Respiratory Syncytial Virus NOT DETECTED NOT DETECTED   Bordetella pertussis NOT DETECTED NOT DETECTED   Bordetella Parapertussis NOT DETECTED NOT DETECTED   Chlamydophila pneumoniae NOT DETECTED NOT DETECTED   Mycoplasma pneumoniae NOT DETECTED NOT DETECTED    Comment: Performed at [facility], [address] [place], [place] [address]  Heparin  level (unfractionated)     Status: Abnormal   Collection Time: 07/18/24  8:02 PM  Result Value Ref Range   Heparin  Unfractionated >1.10 (H) 0.30 - 0.70 IU/mL    Comment: (NOTE) The clinical reportable range upper limit is being lowered to >1.10 to align with the FDA approved guidance for the current laboratory assay.  If heparin  results are below expected values, and patient dosage has  been confirmed, suggest follow up testing of antithrombin III levels. Performed at [facility], [address], [place], [place] [address]   APTT     Status: Abnormal   Collection Time: 07/18/24  8:02 PM  Result Value Ref Range   aPTT >200 (HH) 24 - 36 seconds    Comment:        IF BASELINE aPTT IS ELEVATED, SUGGEST PATIENT RISK ASSESSMENT BE USED TO DETERMINE APPROPRIATE ANTICOAGULANT THERAPY. REPEATED TO VERIFY CRITICAL RESULT CALLED TO, READ BACK BY AND VERIFIED WITH: MARYBETH EDISON RN @ [phone] 07/18/2024 SORTOG Performed at [facility], [address] [place], [place] [address]   Glucose, capillary     Status: Abnormal   Collection Time: 07/18/24  8:05 PM  Result Value Ref Range   Glucose-Capillary 140 (H) 70 - 99 mg/dL    Comment: Glucose reference range applies only to samples taken after fasting for at least 8 hours.  Glucose, capillary     Status: Abnormal   Collection Time: 07/18/24  9:12 PM  Result Value Ref Range    Glucose-Capillary 109 (H) 70 - 99 mg/dL    Comment: Glucose reference range applies only to samples taken after fasting for at least 8 hours.  Glucose, capillary     Status: Abnormal   Collection Time: 07/18/24 10:09 PM  Result Value Ref Range   Glucose-Capillary 115 (H) 70 - 99 mg/dL    Comment: Glucose reference range applies only to samples taken after fasting for at least 8 hours.  Heparin  level (unfractionated)     Status: None   Collection  Time: 07/18/24 11:06 PM  Result Value Ref Range   Heparin  Unfractionated 0.47 0.30 - 0.70 IU/mL    Comment: (NOTE) The clinical reportable range upper limit is being lowered to >1.10 to align with the FDA approved guidance for the current laboratory assay.  If heparin  results are below expected values, and patient dosage has  been confirmed, suggest follow up testing of antithrombin III levels. Performed at [facility], [address], [place], [place] [address]   Glucose, capillary     Status: Abnormal   Collection Time: 07/18/24 11:10 PM  Result Value Ref Range   Glucose-Capillary 136 (H) 70 - 99 mg/dL    Comment: Glucose reference range applies only to samples taken after fasting for at least 8 hours.  Glucose, capillary     Status: None   Collection Time: 07/19/24 12:05 AM  Result Value Ref Range   Glucose-Capillary 89 70 - 99 mg/dL    Comment: Glucose reference range applies only to samples taken after fasting for at least 8 hours.  Glucose, capillary     Status: None   Collection Time: 07/19/24  1:10 AM  Result Value Ref Range   Glucose-Capillary 86 70 - 99 mg/dL    Comment: Glucose reference range applies only to samples taken after fasting for at least 8 hours.  I-STAT 7, (LYTES, BLD GAS, ICA, H+H)     Status: Abnormal   Collection Time: 07/19/24  1:13 AM  Result Value Ref Range   pH, Arterial 7.461 (H) 7.35 - 7.45   pCO2 arterial 29.2 (L) 32 - 48 mmHg   pO2, Arterial 158 (H) 83 - 108 mmHg   Bicarbonate 20.8 20.0 -  28.0 mmol/L   TCO2 22 22 - 32 mmol/L   O2 Saturation 100 %   Acid-base deficit 3.0 (H) 0.0 - 2.0 mmol/L   Sodium 140 135 - 145 mmol/L   Potassium 3.3 (L) 3.5 - 5.1 mmol/L   Calcium, Ion 1.08 (L) 1.15 - 1.40 mmol/L   HCT 21.0 (L) 39.0 - 52.0 %   Hemoglobin 7.1 (L) 13.0 - 17.0 g/dL   Patient temperature 62.4 C    Sample type ARTERIAL   Triglycerides     Status: None   Collection Time: 07/19/24  1:55 AM  Result Value Ref Range   Triglycerides 146 <150 mg/dL    Comment: Performed at [facility], [address], [place], [place] [address]  CBC     Status: Abnormal   Collection Time: 07/19/24  1:55 AM  Result Value Ref Range   WBC 14.0 (H) 4.0 - 10.5 K/uL   RBC 2.84 (L) 4.22 - 5.81 MIL/uL   Hemoglobin 9.0 (L) 13.0 - 17.0 g/dL   HCT 73.6 (L) 60.9 - 47.9 %   MCV 92.6 80.0 - 100.0 fL   MCH 31.7 26.0 - 34.0 pg   MCHC 34.2 30.0 - 36.0 g/dL   RDW 88.4 88.4 - 84.4 %   Platelets 122 (L) 150 - 400 K/uL   nRBC 0.0 0.0 - 0.2 %    Comment: Performed at [facility], [address], [place], [place] [address]  Comprehensive metabolic panel with GFR     Status: Abnormal   Collection Time: 07/19/24  1:55 AM  Result Value Ref Range   Sodium 135 135 - 145 mmol/L   Potassium 3.8 3.5 - 5.1 mmol/L   Chloride 100 98 - 111 mmol/L   CO2 22 22 - 32 mmol/L  Glucose, Bld 137 (H) 70 - 99 mg/dL    Comment: Glucose reference range applies only to samples taken after fasting for at least 8 hours.   BUN 56 (H) 8 - 23 mg/dL   Creatinine, Ser 5.81 (H) 0.61 - 1.24 mg/dL   Calcium 8.0 (L) 8.9 - 10.3 mg/dL   Total Protein 3.9 (L) 6.5 - 8.1 g/dL   Albumin 1.9 (L) 3.5 - 5.0 g/dL   AST 377 (H) 15 - 41 U/L   ALT 673 (H) 0 - 44 U/L   Alkaline Phosphatase 50 38 - 126 U/L   Total Bilirubin 2.3 (H) 0.0 - 1.2 mg/dL   GFR, Estimated 14 (L) >60 mL/min    Comment: (NOTE) Calculated using the CKD-EPI Creatinine Equation (2021)    Anion gap 13 5 - 15    Comment: Performed at [facility], [address], [place], [place] [address]  Lactic acid, plasma     Status: None   Collection Time: 07/19/24  1:55 AM  Result Value Ref Range   Lactic Acid, Venous 1.4 0.5 - 1.9 mmol/L    Comment: Performed at [facility], [address], [place], [place] [address]  Glucose, capillary     Status: Abnormal   Collection Time: 07/19/24  2:00 AM  Result Value Ref Range   Glucose-Capillary 138 (H) 70 - 99 mg/dL    Comment: Glucose reference range applies only to samples taken after fasting for at least 8 hours.  Glucose, capillary     Status: Abnormal   Collection Time: 07/19/24  3:38 AM  Result Value Ref Range   Glucose-Capillary 114 (H) 70 - 99 mg/dL    Comment: Glucose reference range applies only to samples taken after fasting for at least 8 hours.  Glucose, capillary     Status: Abnormal   Collection Time: 07/19/24  5:00 AM  Result Value Ref Range   Glucose-Capillary 128 (H) 70 - 99 mg/dL    Comment: Glucose reference range applies only to samples taken after fasting for at least 8 hours.  Glucose, capillary     Status: Abnormal   Collection Time: 07/19/24  6:50 AM  Result Value Ref Range   Glucose-Capillary 111 (H) 70 - 99 mg/dL    Comment: Glucose reference range applies only to samples taken after fasting for at least 8 hours.  Glucose, capillary     Status: Abnormal   Collection Time: 07/19/24  8:39 AM  Result Value Ref Range   Glucose-Capillary 173 (H) 70 - 99 mg/dL    Comment: Glucose reference range applies only to samples taken after fasting for at least 8 hours.   IR THROMBECT PRIM MECH ADD (INCLU) MOD SED Result Date: 07/18/2024 INDICATION: 78 year old with bilateral pulmonary embolism and had PEA cardiac arrest during cervical spine surgery. Evidence for right heart strain on CTA. Patient is intubated and requiring significant pressor support. EXAM: 1. Bilateral pulmonary arteriography 2. Mechanical thrombectomy of bilateral pulmonary embolism 3. Ultrasound guidance for  vascular access 4. Pulmonary artery pressures COMPARISON:  CTA chest 07/17/2024 MEDICATIONS: Heparin  5000 units ANESTHESIA/SEDATION: The patient's level of consciousness and vital signs were monitored continuously by radiology and critical care nursing throughout the procedure. Sedation was performed per critical care. FLUOROSCOPY: Radiation Exposure Index (as provided by the fluoroscopic device): 125.4 mGy Kerma CONTRAST:  90 mL Omnipaque  300 COMPLICATIONS: None immediate. TECHNIQUE: Informed written consent was obtained from the patient's family after a thorough discussion of the procedural risks,  benefits and alternatives. All questions were addressed. Maximal Sterile Barrier Technique was utilized including caps, mask, sterile gowns, sterile gloves, sterile drape, hand hygiene and skin antiseptic. A timeout was performed prior to the initiation of the procedure. Patient was placed supine. Ultrasound demonstrated a patent right common femoral vein. Ultrasound image was saved for documentation. Right groin was prepped and draped in sterile fashion. Skin was anesthetized with 1% lidocaine . Small incision was made. Using ultrasound guidance, a 21 gauge needle was directed into the right common femoral vein. Micropuncture dilator set was placed. 7 French vascular sheath was placed. Curved pigtail catheter was advanced into the IVC and into the main pulmonary artery using a tip deflecting wire with fluoroscopy. Pulmonary arterial pressure was obtained. Pigtail catheter was exchanged for a 5 French vert catheter and eventually a C2 catheter. It was technically difficult to get a catheter and wire into the distal main right pulmonary artery. Patient was having frequent PVCs with manipulation in the right heart and pulmonary arteries. Eventually the curved pigtail catheter was successfully advanced into the distal main pulmonary artery using the tip deflecting wire. Pulmonary arteriogram was performed at this location.  The pigtail catheter and 8 French sheath were removed over a superstiff Amplatz wire with a short tip. Inari 24 French sheath was placed over the wire. [id number] aspiration catheter was advanced over the wire and into the right interlobar artery with fluoroscopy. Suction thrombectomy was performed. Large amount of clot was removed. Aspiration catheter was pulled back into the main pulmonary artery. Pulmonary arteriogram was performed in the main pulmonary artery. The vert catheter was advanced into the main left pulmonary artery using a buddy wire technique. The 5 French catheter and Bentson wire were advanced into a left lower lobe pulmonary artery. Bentson wire was exchanged for the short tip superstiff Amplatz wire. The 5 French catheter was removed. The curved Triever20 aspiration catheter was advanced over the wire into the left pulmonary artery. Suction thrombectomy was performed. Catheter was pulled back in the main pulmonary artery and pulmonary arteriography was performed. Final pulmonary arterial pressure was obtained. Aspiration catheters were removed. Right groin sheath was removed using a pursestring suture. Right groin hemostasis at the end of the procedure. FINDINGS: Pre thrombectomy main pulmonary artery pressure = 28/16 mmHg, mean 20 Post thrombectomy main pulmonary artery pressure = 24/9 mmHg, mean 15 Pre thrombectomy angiography demonstrated a large amount of clot at the right pulmonary artery bifurcation. Large amount of clot was removed from the right pulmonary arteries. The large clot at the right pulmonary artery bifurcation was significantly debulked after the thrombectomy. Flow in the right inter lobar artery and truncus anterior after thrombectomy. Pre thrombectomy images demonstrated occlusive thrombus in the left lower lobe pulmonary artery. Large amount of clot was removed from the left pulmonary arteries. After thrombectomy, the occlusive thrombus in the left lower lobe was removed and  there was flow into the left lower lobe. IMPRESSION: 1. Successful mechanical thrombectomy of bilateral pulmonary emboli. Electronically Signed   By: Juliene Balder M.D.   On: 07/18/2024 17:31   IR THROMBECT PRIM MECH ADD (INCLU) MOD SED Result Date: 07/18/2024 INDICATION: 78 year old with bilateral pulmonary embolism and had PEA cardiac arrest during cervical spine surgery. Evidence for right heart strain on CTA. Patient is intubated and requiring significant pressor support. EXAM: 1. Bilateral pulmonary arteriography 2. Mechanical thrombectomy of bilateral pulmonary embolism 3. Ultrasound guidance for vascular access 4. Pulmonary artery pressures COMPARISON:  CTA chest 07/17/2024 MEDICATIONS: Heparin  5000  units ANESTHESIA/SEDATION: The patient's level of consciousness and vital signs were monitored continuously by radiology and critical care nursing throughout the procedure. Sedation was performed per critical care. FLUOROSCOPY: Radiation Exposure Index (as provided by the fluoroscopic device): 125.4 mGy Kerma CONTRAST:  90 mL Omnipaque  300 COMPLICATIONS: None immediate. TECHNIQUE: Informed written consent was obtained from the patient's family after a thorough discussion of the procedural risks, benefits and alternatives. All questions were addressed. Maximal Sterile Barrier Technique was utilized including caps, mask, sterile gowns, sterile gloves, sterile drape, hand hygiene and skin antiseptic. A timeout was performed prior to the initiation of the procedure. Patient was placed supine. Ultrasound demonstrated a patent right common femoral vein. Ultrasound image was saved for documentation. Right groin was prepped and draped in sterile fashion. Skin was anesthetized with 1% lidocaine . Small incision was made. Using ultrasound guidance, a 21 gauge needle was directed into the right common femoral vein. Micropuncture dilator set was placed. 7 French vascular sheath was placed. Curved pigtail catheter was advanced  into the IVC and into the main pulmonary artery using a tip deflecting wire with fluoroscopy. Pulmonary arterial pressure was obtained. Pigtail catheter was exchanged for a 5 French vert catheter and eventually a C2 catheter. It was technically difficult to get a catheter and wire into the distal main right pulmonary artery. Patient was having frequent PVCs with manipulation in the right heart and pulmonary arteries. Eventually the curved pigtail catheter was successfully advanced into the distal main pulmonary artery using the tip deflecting wire. Pulmonary arteriogram was performed at this location. The pigtail catheter and 8 French sheath were removed over a superstiff Amplatz wire with a short tip. Inari 24 French sheath was placed over the wire. [id number] aspiration catheter was advanced over the wire and into the right interlobar artery with fluoroscopy. Suction thrombectomy was performed. Large amount of clot was removed. Aspiration catheter was pulled back into the main pulmonary artery. Pulmonary arteriogram was performed in the main pulmonary artery. The vert catheter was advanced into the main left pulmonary artery using a buddy wire technique. The 5 French catheter and Bentson wire were advanced into a left lower lobe pulmonary artery. Bentson wire was exchanged for the short tip superstiff Amplatz wire. The 5 French catheter was removed. The curved Triever20 aspiration catheter was advanced over the wire into the left pulmonary artery. Suction thrombectomy was performed. Catheter was pulled back in the main pulmonary artery and pulmonary arteriography was performed. Final pulmonary arterial pressure was obtained. Aspiration catheters were removed. Right groin sheath was removed using a pursestring suture. Right groin hemostasis at the end of the procedure. FINDINGS: Pre thrombectomy main pulmonary artery pressure = 28/16 mmHg, mean 20 Post thrombectomy main pulmonary artery pressure = 24/9 mmHg, mean 15  Pre thrombectomy angiography demonstrated a large amount of clot at the right pulmonary artery bifurcation. Large amount of clot was removed from the right pulmonary arteries. The large clot at the right pulmonary artery bifurcation was significantly debulked after the thrombectomy. Flow in the right inter lobar artery and truncus anterior after thrombectomy. Pre thrombectomy images demonstrated occlusive thrombus in the left lower lobe pulmonary artery. Large amount of clot was removed from the left pulmonary arteries. After thrombectomy, the occlusive thrombus in the left lower lobe was removed and there was flow into the left lower lobe. IMPRESSION: 1. Successful mechanical thrombectomy of bilateral pulmonary emboli. Electronically Signed   By: Juliene Balder M.D.   On: 07/18/2024 17:31   IR THROMBECT PRIM  MECH ADD (INCLU) MOD SED Result Date: 07/18/2024 INDICATION: 78 year old with bilateral pulmonary embolism and had PEA cardiac arrest during cervical spine surgery. Evidence for right heart strain on CTA. Patient is intubated and requiring significant pressor support. EXAM: 1. Bilateral pulmonary arteriography 2. Mechanical thrombectomy of bilateral pulmonary embolism 3. Ultrasound guidance for vascular access 4. Pulmonary artery pressures COMPARISON:  CTA chest 07/17/2024 MEDICATIONS: Heparin  5000 units ANESTHESIA/SEDATION: The patient's level of consciousness and vital signs were monitored continuously by radiology and critical care nursing throughout the procedure. Sedation was performed per critical care. FLUOROSCOPY: Radiation Exposure Index (as provided by the fluoroscopic device): 125.4 mGy Kerma CONTRAST:  90 mL Omnipaque  300 COMPLICATIONS: None immediate. TECHNIQUE: Informed written consent was obtained from the patient's family after a thorough discussion of the procedural risks, benefits and alternatives. All questions were addressed. Maximal Sterile Barrier Technique was utilized including caps,  mask, sterile gowns, sterile gloves, sterile drape, hand hygiene and skin antiseptic. A timeout was performed prior to the initiation of the procedure. Patient was placed supine. Ultrasound demonstrated a patent right common femoral vein. Ultrasound image was saved for documentation. Right groin was prepped and draped in sterile fashion. Skin was anesthetized with 1% lidocaine . Small incision was made. Using ultrasound guidance, a 21 gauge needle was directed into the right common femoral vein. Micropuncture dilator set was placed. 7 French vascular sheath was placed. Curved pigtail catheter was advanced into the IVC and into the main pulmonary artery using a tip deflecting wire with fluoroscopy. Pulmonary arterial pressure was obtained. Pigtail catheter was exchanged for a 5 French vert catheter and eventually a C2 catheter. It was technically difficult to get a catheter and wire into the distal main right pulmonary artery. Patient was having frequent PVCs with manipulation in the right heart and pulmonary arteries. Eventually the curved pigtail catheter was successfully advanced into the distal main pulmonary artery using the tip deflecting wire. Pulmonary arteriogram was performed at this location. The pigtail catheter and 8 French sheath were removed over a superstiff Amplatz wire with a short tip. Inari 24 French sheath was placed over the wire. Umpzczm75 aspiration catheter was advanced over the wire and into the right interlobar artery with fluoroscopy. Suction thrombectomy was performed. Large amount of clot was removed. Aspiration catheter was pulled back into the main pulmonary artery. Pulmonary arteriogram was performed in the main pulmonary artery. The vert catheter was advanced into the main left pulmonary artery using a buddy wire technique. The 5 French catheter and Bentson wire were advanced into a left lower lobe pulmonary artery. Bentson wire was exchanged for the short tip superstiff Amplatz wire.  The 5 French catheter was removed. The curved Triever20 aspiration catheter was advanced over the wire into the left pulmonary artery. Suction thrombectomy was performed. Catheter was pulled back in the main pulmonary artery and pulmonary arteriography was performed. Final pulmonary arterial pressure was obtained. Aspiration catheters were removed. Right groin sheath was removed using a pursestring suture. Right groin hemostasis at the end of the procedure. FINDINGS: Pre thrombectomy main pulmonary artery pressure = 28/16 mmHg, mean 20 Post thrombectomy main pulmonary artery pressure = 24/9 mmHg, mean 15 Pre thrombectomy angiography demonstrated a large amount of clot at the right pulmonary artery bifurcation. Large amount of clot was removed from the right pulmonary arteries. The large clot at the right pulmonary artery bifurcation was significantly debulked after the thrombectomy. Flow in the right inter lobar artery and truncus anterior after thrombectomy. Pre thrombectomy images demonstrated occlusive  thrombus in the left lower lobe pulmonary artery. Large amount of clot was removed from the left pulmonary arteries. After thrombectomy, the occlusive thrombus in the left lower lobe was removed and there was flow into the left lower lobe. IMPRESSION: 1. Successful mechanical thrombectomy of bilateral pulmonary emboli. Electronically Signed   By: Juliene Balder M.D.   On: 07/18/2024 17:31   IR Angiogram Pulmonary Bilateral Selective Result Date: 07/18/2024 INDICATION: 78 year old with bilateral pulmonary embolism and had PEA cardiac arrest during cervical spine surgery. Evidence for right heart strain on CTA. Patient is intubated and requiring significant pressor support. EXAM: 1. Bilateral pulmonary arteriography 2. Mechanical thrombectomy of bilateral pulmonary embolism 3. Ultrasound guidance for vascular access 4. Pulmonary artery pressures COMPARISON:  CTA chest 07/17/2024 MEDICATIONS: Heparin  5000 units  ANESTHESIA/SEDATION: The patient's level of consciousness and vital signs were monitored continuously by radiology and critical care nursing throughout the procedure. Sedation was performed per critical care. FLUOROSCOPY: Radiation Exposure Index (as provided by the fluoroscopic device): 125.4 mGy Kerma CONTRAST:  90 mL Omnipaque  300 COMPLICATIONS: None immediate. TECHNIQUE: Informed written consent was obtained from the patient's family after a thorough discussion of the procedural risks, benefits and alternatives. All questions were addressed. Maximal Sterile Barrier Technique was utilized including caps, mask, sterile gowns, sterile gloves, sterile drape, hand hygiene and skin antiseptic. A timeout was performed prior to the initiation of the procedure. Patient was placed supine. Ultrasound demonstrated a patent right common femoral vein. Ultrasound image was saved for documentation. Right groin was prepped and draped in sterile fashion. Skin was anesthetized with 1% lidocaine . Small incision was made. Using ultrasound guidance, a 21 gauge needle was directed into the right common femoral vein. Micropuncture dilator set was placed. 7 French vascular sheath was placed. Curved pigtail catheter was advanced into the IVC and into the main pulmonary artery using a tip deflecting wire with fluoroscopy. Pulmonary arterial pressure was obtained. Pigtail catheter was exchanged for a 5 French vert catheter and eventually a C2 catheter. It was technically difficult to get a catheter and wire into the distal main right pulmonary artery. Patient was having frequent PVCs with manipulation in the right heart and pulmonary arteries. Eventually the curved pigtail catheter was successfully advanced into the distal main pulmonary artery using the tip deflecting wire. Pulmonary arteriogram was performed at this location. The pigtail catheter and 8 French sheath were removed over a superstiff Amplatz wire with a short tip. Inari 24  French sheath was placed over the wire. Umpzczm75 aspiration catheter was advanced over the wire and into the right interlobar artery with fluoroscopy. Suction thrombectomy was performed. Large amount of clot was removed. Aspiration catheter was pulled back into the main pulmonary artery. Pulmonary arteriogram was performed in the main pulmonary artery. The vert catheter was advanced into the main left pulmonary artery using a buddy wire technique. The 5 French catheter and Bentson wire were advanced into a left lower lobe pulmonary artery. Bentson wire was exchanged for the short tip superstiff Amplatz wire. The 5 French catheter was removed. The curved Triever20 aspiration catheter was advanced over the wire into the left pulmonary artery. Suction thrombectomy was performed. Catheter was pulled back in the main pulmonary artery and pulmonary arteriography was performed. Final pulmonary arterial pressure was obtained. Aspiration catheters were removed. Right groin sheath was removed using a pursestring suture. Right groin hemostasis at the end of the procedure. FINDINGS: Pre thrombectomy main pulmonary artery pressure = 28/16 mmHg, mean 20 Post thrombectomy main pulmonary  artery pressure = 24/9 mmHg, mean 15 Pre thrombectomy angiography demonstrated a large amount of clot at the right pulmonary artery bifurcation. Large amount of clot was removed from the right pulmonary arteries. The large clot at the right pulmonary artery bifurcation was significantly debulked after the thrombectomy. Flow in the right inter lobar artery and truncus anterior after thrombectomy. Pre thrombectomy images demonstrated occlusive thrombus in the left lower lobe pulmonary artery. Large amount of clot was removed from the left pulmonary arteries. After thrombectomy, the occlusive thrombus in the left lower lobe was removed and there was flow into the left lower lobe. IMPRESSION: 1. Successful mechanical thrombectomy of bilateral  pulmonary emboli. Electronically Signed   By: Juliene Balder M.D.   On: 07/18/2024 17:31   IR Angiogram Selective Each Additional Vessel Result Date: 07/18/2024 INDICATION: 78 year old with bilateral pulmonary embolism and had PEA cardiac arrest during cervical spine surgery. Evidence for right heart strain on CTA. Patient is intubated and requiring significant pressor support. EXAM: 1. Bilateral pulmonary arteriography 2. Mechanical thrombectomy of bilateral pulmonary embolism 3. Ultrasound guidance for vascular access 4. Pulmonary artery pressures COMPARISON:  CTA chest 07/17/2024 MEDICATIONS: Heparin  5000 units ANESTHESIA/SEDATION: The patient's level of consciousness and vital signs were monitored continuously by radiology and critical care nursing throughout the procedure. Sedation was performed per critical care. FLUOROSCOPY: Radiation Exposure Index (as provided by the fluoroscopic device): 125.4 mGy Kerma CONTRAST:  90 mL Omnipaque  300 COMPLICATIONS: None immediate. TECHNIQUE: Informed written consent was obtained from the patient's family after a thorough discussion of the procedural risks, benefits and alternatives. All questions were addressed. Maximal Sterile Barrier Technique was utilized including caps, mask, sterile gowns, sterile gloves, sterile drape, hand hygiene and skin antiseptic. A timeout was performed prior to the initiation of the procedure. Patient was placed supine. Ultrasound demonstrated a patent right common femoral vein. Ultrasound image was saved for documentation. Right groin was prepped and draped in sterile fashion. Skin was anesthetized with 1% lidocaine . Small incision was made. Using ultrasound guidance, a 21 gauge needle was directed into the right common femoral vein. Micropuncture dilator set was placed. 7 French vascular sheath was placed. Curved pigtail catheter was advanced into the IVC and into the main pulmonary artery using a tip deflecting wire with fluoroscopy.  Pulmonary arterial pressure was obtained. Pigtail catheter was exchanged for a 5 French vert catheter and eventually a C2 catheter. It was technically difficult to get a catheter and wire into the distal main right pulmonary artery. Patient was having frequent PVCs with manipulation in the right heart and pulmonary arteries. Eventually the curved pigtail catheter was successfully advanced into the distal main pulmonary artery using the tip deflecting wire. Pulmonary arteriogram was performed at this location. The pigtail catheter and 8 French sheath were removed over a superstiff Amplatz wire with a short tip. Inari 24 French sheath was placed over the wire. Umpzczm75 aspiration catheter was advanced over the wire and into the right interlobar artery with fluoroscopy. Suction thrombectomy was performed. Large amount of clot was removed. Aspiration catheter was pulled back into the main pulmonary artery. Pulmonary arteriogram was performed in the main pulmonary artery. The vert catheter was advanced into the main left pulmonary artery using a buddy wire technique. The 5 French catheter and Bentson wire were advanced into a left lower lobe pulmonary artery. Bentson wire was exchanged for the short tip superstiff Amplatz wire. The 5 French catheter was removed. The curved Triever20 aspiration catheter was advanced over the wire into  the left pulmonary artery. Suction thrombectomy was performed. Catheter was pulled back in the main pulmonary artery and pulmonary arteriography was performed. Final pulmonary arterial pressure was obtained. Aspiration catheters were removed. Right groin sheath was removed using a pursestring suture. Right groin hemostasis at the end of the procedure. FINDINGS: Pre thrombectomy main pulmonary artery pressure = 28/16 mmHg, mean 20 Post thrombectomy main pulmonary artery pressure = 24/9 mmHg, mean 15 Pre thrombectomy angiography demonstrated a large amount of clot at the right pulmonary  artery bifurcation. Large amount of clot was removed from the right pulmonary arteries. The large clot at the right pulmonary artery bifurcation was significantly debulked after the thrombectomy. Flow in the right inter lobar artery and truncus anterior after thrombectomy. Pre thrombectomy images demonstrated occlusive thrombus in the left lower lobe pulmonary artery. Large amount of clot was removed from the left pulmonary arteries. After thrombectomy, the occlusive thrombus in the left lower lobe was removed and there was flow into the left lower lobe. IMPRESSION: 1. Successful mechanical thrombectomy of bilateral pulmonary emboli. Electronically Signed   By: Juliene Balder M.D.   On: 07/18/2024 17:31   CT Angio Chest Pulmonary Embolism (PE) W or WO Contrast Addendum Date: 07/17/2024 ADDENDUM REPORT: 07/17/2024 18:30 ADDENDUM: Critical Value/emergent results were called by telephone at the time of interpretation on 07/17/2024 at 6:30 pm to provider Rolan Sharps, who verbally acknowledged these results. Electronically Signed   By: Leita Birmingham M.D.   On: 07/17/2024 18:30   Result Date: 07/17/2024 CLINICAL DATA:  Pulmonary embolism suspected, high probability. PE seen on echo, follow-up. EXAM: CT ANGIOGRAPHY CHEST WITH CONTRAST TECHNIQUE: Multidetector CT imaging of the chest was performed using the standard protocol during bolus administration of intravenous contrast. Multiplanar CT image reconstructions and MIPs were obtained to evaluate the vascular anatomy. RADIATION DOSE REDUCTION: This exam was performed according to the departmental dose-optimization program which includes automated exposure control, adjustment of the mA and/or kV according to patient size and/or use of iterative reconstruction technique. CONTRAST:  75mL OMNIPAQUE  IOHEXOL  350 MG/ML SOLN COMPARISON:  None Available. FINDINGS: Cardiovascular: The heart is normal in size and there is a trace pericardial effusion. A few scattered coronary artery  calcifications are present. There is atherosclerotic calcification of the aorta without evidence of aneurysm. The pulmonary trunk is distended suggesting underlying pulmonary artery hypertension. Pulmonary artery filling defects are noted in the left and right pulmonary arteries extending into the lobar, segmental and subsegmental arteries bilaterally. There is extensive thrombus burden. The right ventricle is distended suggesting right heart strain. A right internal jugular central venous catheter terminates in the superior vena cava. Mediastinum/Nodes: No mediastinal, hilar, or axillary lymphadenopathy is seen. The thyroid gland is within normal limits. An endotracheal tube and enteric tube are identified. Lungs/Pleura: There are small bilateral pleural effusions, greater on the right than the left, with associated atelectasis. Scattered infiltrates are present in the left upper lobe, suspicious for pneumonia. No pneumothorax is seen. Upper Abdomen: Hyperdense material is present in the gallbladder, possible sludge or stones. Mild ascites is noted in the upper abdomen. An enteric tube terminates in the stomach. Mild fat stranding is present about the pancreas. No pancreatic ductal dilatation. Musculoskeletal: Degenerative changes are present in the thoracic spine. Postsurgical changes are noted in the cervical region with air in the subcutaneous soft tissues. There is a fracture of the T6 rib on the right anteriorly. There are fractures of the T5 and T6 ribs on the left anteriorly. Old healed rib  fractures are present on the left. Review of the MIP images confirms the above findings. IMPRESSION: 1. Extensive pulmonary artery thrombus bilaterally with evidence of right heart strain. 2. Scattered infiltrates in the left upper lobe, suspicious for pneumonia. 3. Small bilateral pleural effusions. 4. Acute fractures of the T6 rib on the right in T5 and T6 ribs on the left. 5. Fat stranding about the pancreas, possible  pancreatitis. Correlation with amylase and lipase is suggested. 6. Small ascites in the upper abdomen. 7. Coronary artery calcifications and aortic atherosclerosis. 8. Postsurgical changes in the cervical region with subcutaneous air. 9. Hyperdense material in the gallbladder, possible stones or sludge. Electronically Signed: By: Leita Birmingham M.D. On: 07/17/2024 17:40   CT HEAD WO CONTRAST ( ) Result Date: 07/17/2024 EXAM: CT HEAD WITHOUT CONTRAST 07/17/2024 05:18:17 PM TECHNIQUE: CT of the head was performed without the administration of intravenous contrast. Automated exposure control, iterative reconstruction, and/or weight based adjustment of the mA/kV was utilized to reduce the radiation dose to as low as reasonably achievable. COMPARISON: 07/16/2024 CLINICAL HISTORY: Multiple episodes of PEA cardiac arrest during attempted cervical spine surgery. FINDINGS: BRAIN AND VENTRICLES: No acute hemorrhage. No evidence of acute infarct. No hydrocephalus. No extra-axial collection. No mass effect or midline shift. Atrophy is similar to the prior study, mildly advanced for age. White matter hypoattenuation is similar to the prior study, mildly advanced for age. Calcified atherosclerosis at the skull base. ORBITS: Bilateral cataract extraction. No acute abnormality. SINUSES: Mild mucosal thickening in paranasal sinuses. Secretions in nasopharynx, likely related to intubation. SOFT TISSUES AND SKULL: No acute soft tissue abnormality. No skull fracture. IMPRESSION: 1. No acute intracranial abnormality. 2. Mildly advanced cerebral atrophy and mild chronic small vessel ischemic disease, similar to the prior study. Electronically signed by: Lonni Necessary MD 07/17/2024 05:49 PM EDT RP Workstation: [id number]   DG CHEST PORT 1 VIEW Result Date: 07/17/2024 CLINICAL DATA:  Central line placement and intubation. EXAM: PORTABLE CHEST 1 VIEW COMPARISON:  12/23/2020. FINDINGS: Endotracheal tube terminates  approximately 5.9 cm above the carina. Nasogastric tube terminates in the stomach. Right IJ central line tip is in the SVC. A defibrillator pad overlies the lower left chest. Trachea is midline. Heart size stable. No airspace consolidation or pleural fluid. Surgical skin staples in the neck. IMPRESSION: 1. Satisfactory support apparatus placement. 2. No acute findings. Electronically Signed   By: Newell Eke M.D.   On: 07/17/2024 16:24   DG C-Arm 1-60 Min-No Report Result Date: 07/17/2024 Fluoroscopy was utilized by the requesting physician.  No radiographic interpretation.   DG C-Arm 1-60 Min-No Report Result Date: 07/17/2024 Fluoroscopy was utilized by the requesting physician.  No radiographic interpretation.   DG C-Arm 1-60 Min-No Report Result Date: 07/17/2024 Fluoroscopy was utilized by the requesting physician.  No radiographic interpretation.    PMH:   Past Medical History:  Diagnosis Date   Arthritis    Diabetes mellitus without complication (HCC)    GERD (gastroesophageal reflux disease)    Hypertension    Sleep apnea     PSH:   Past Surgical History:  Procedure Laterality Date   APPENDECTOMY     78 yo   BACK SURGERY  1975   lumbar diskectomy   done [facility]   COLONOSCOPY N/A 02/21/2015   Procedure: COLONOSCOPY;  Surgeon: Claudis RAYMOND Rivet, MD;  Location: [facility];  Service: Endoscopy;  Laterality: N/A;  1025   COLONOSCOPY  11/13/2021   COLONOSCOPY WITH PROPOFOL  N/A 11/13/2021  Procedure: COLONOSCOPY WITH PROPOFOL ;  Surgeon: Golda Claudis PENNER, MD;  Location: [facility];  Service: Endoscopy;  Laterality: N/A;  155   CYSTOSCOPY WITH URETHRAL DILATATION N/A 12/09/2022   Procedure: CYSTOSCOPY WITH URETHRAL DILATATION;  Surgeon: Watt Rush, MD;  Location: [facility];  Service: Urology;  Laterality: N/A;   EYE SURGERY Bilateral    Lasik surgery   IR ANGIOGRAM PULMONARY BILATERAL SELECTIVE  07/18/2024   IR ANGIOGRAM SELECTIVE EACH ADDITIONAL VESSEL   07/18/2024   IR THROMBECT PRIM MECH ADD (INCLU) MOD SED  07/18/2024   IR THROMBECT PRIM MECH ADD (INCLU) MOD SED  07/18/2024   IR THROMBECT PRIM MECH ADD (INCLU) MOD SED  07/18/2024   LEFT HEART CATH AND CORONARY ANGIOGRAPHY N/A 12/23/2020   Procedure: LEFT HEART CATH AND CORONARY ANGIOGRAPHY;  Surgeon: Burnard Debby LABOR, MD;  Location: [facility];  Service: Cardiovascular;  Laterality: N/A;   NECK SURGERY  1999   Work injury, posterior fusion   POLYPECTOMY  11/13/2021   Procedure: POLYPECTOMY INTESTINAL;  Surgeon: Golda Claudis PENNER, MD;  Location: [facility];  Service: Endoscopy;;   SHOULDER SURGERY Bilateral 2000   rotoator cuff repair   done at [facility]   TONSILLECTOMY     TRANSURETHRAL INCISION OF PROSTATE N/A 12/09/2022   Procedure: TRANSURETHRAL RESECTION OF PROSTATE;  Surgeon: Watt Rush, MD;  Location: [facility];  Service: Urology;  Laterality: N/A;  1 HR FOR CASE    Allergies:  Allergies  Allergen Reactions   Penicillins Hives   Lucentis [Ranibizumab] Rash    Medications:   Prior to Admission medications   Medication Sig Start Date End Date Taking? Authorizing Provider  Apple Cider Vinegar 600 MG CAPS Take 600 mg by mouth daily.   Yes [provider]  aspirin  EC 81 MG tablet Take 1 tablet (81 mg total) by mouth every other day. 11/14/21  Yes Rehman, Claudis PENNER, MD  Barberry-Oreg Grape-Goldenseal (BERBERINE COMPLEX PO) Take 1 capsule by mouth daily.   Yes [provider]  cholecalciferol (VITAMIN D3) 25 MCG (1000 UNIT) tablet Take 1,000 Units by mouth daily.   Yes [provider]  Cinnamon 500 MG capsule Take 500 mg by mouth daily.   Yes [provider]  diphenhydrAMINE HCl (NERVINE PO) Take 1 tablet by mouth daily. Patient states that he takes one a day   Yes [provider]  empagliflozin (JARDIANCE) 10 MG TABS tablet Take 10 mg by mouth daily.   Yes [provider]  ferrous sulfate 325 (65 FE) MG tablet Take 325 mg  by mouth daily with breakfast.   Yes [provider]  hydrochlorothiazide  (HYDRODIURIL ) 25 MG tablet Take 25 mg by mouth daily. 07/23/21  Yes [provider]  insulin  degludec (TRESIBA  FLEXTOUCH) 100 UNIT/ML FlexTouch Pen Inject 12 Units into the skin at bedtime. Patient taking differently: Inject 28 Units into the skin 2 (two) times daily. 10/07/22  Yes Reardon, Benton PARAS, NP  Multiple Vitamins-Minerals (OCUVITE ADULT 50+ PO) Take 1 tablet by mouth daily.   Yes [provider]  OVER THE COUNTER MEDICATION Take 1 capsule by mouth daily. Prostagenix   Yes [provider]  OVER THE COUNTER MEDICATION Take 1 capsule by mouth daily. Diabacore (Diabetic Supplement)   Yes [provider]  OVER THE COUNTER MEDICATION Take 1 tablet by mouth in the morning and at bedtime. Glucoceil   Yes [provider]  tadalafil  (CIALIS ) 5 MG tablet Take 1 tablet (5 mg  total) by mouth daily as needed for erectile dysfunction. 12/06/20  Yes Watt Rush, MD  vitamin E 180 MG (400 UNITS) capsule Take 400 Units by mouth daily.   Yes [provider]  Vitamin Mixture (ESTER-C PO) Take 1,000 mg by mouth daily.   Yes [provider]    Discontinued Meds:   Medications Discontinued During This Encounter  Medication Reason   0.9 %  sodium chloride  infusion    lactated ringers  infusion Patient Transfer   insulin  aspart (novoLOG ) injection 0-7 Units Patient Transfer   lidocaine -EPINEPHrine (XYLOCAINE  W/EPI) 1 %-1:100000 (with pres) injection Patient Transfer   0.9 % irrigation (POUR BTL) Patient Transfer   Surgifoam 1 Gm with Thrombin 5,000 units (5 ml) topical solution Patient Transfer   HYDROmorphone  (DILAUDID ) injection 0.25-0.5 mg Patient Transfer   droperidol (INAPSINE) 2.5 MG/ML injection 0.625 mg Patient Transfer   insulin  glargine-yfgn (SEMGLEE) injection 28 Units    midazolam  (VERSED ) 2 MG/2ML injection Returned to ADS   Thrombi-Pad 3X3 pad 1 each     insulin  aspart (novoLOG ) injection 0-6 Units    insulin  glargine-yfgn (SEMGLEE) injection 20 Units    phenylephrine (NEO-SYNEPHRINE) 20mg /NS 250mL premix infusion    heparin  ADULT infusion 100 units/mL (25000 units/250mL)    fentaNYL  (SUBLIMAZE ) injection 25-50 mcg    midazolam  PF (VERSED ) injection 4 mg    norepinephrine (LEVOPHED) 4mg  in 250mL (0.016 mg/mL) premix infusion    heparin  ADULT infusion 100 units/mL (25000 units/250mL)    vasopressin (PITRESSIN) 20 Units in 100 mL (0.2 unit/mL) infusion-*FOR SHOCK*    dexmedetomidine (PRECEDEX) 400 MCG/100ML (4 mcg/mL) infusion    midazolam  (VERSED ) 100 mg/100 mL (1 mg/mL) premix infusion    calcium chloride injection 1 g     Social History:  reports that he has never smoked. He has never used smokeless tobacco. He reports that he does not drink alcohol and does not use drugs.  Family History:  History reviewed. No pertinent family history.  Blood pressure (!) 118/55, pulse (!) 109, temperature 97.9 F (36.6 C), resp. rate (!) 8, height 6' 1 (1.854 m), weight 82.3 kg, SpO2 99%. General appearance: alert, cooperative, and appears stated age Head: Normocephalic, without obvious abnormality, halo on Eyes: negative Neck: no adenopathy, no carotid bruit, supple, symmetrical, trachea midline, and thyroid not enlarged, symmetric, no tenderness/mass/nodules Back: symmetric, no curvature. ROM normal. No CVA tenderness. Resp: clear to auscultation bilaterally Cardio: regular rate and rhythm GI: soft, non-tender; bowel sounds normal; no masses,  no organomegaly Extremities: able to move the upper extremities Pulses: 2+ and symmetric Skin: Skin color, texture, turgor normal. No rashes or lesions       Deneise Getty, LYNWOOD ORN, MD 07/19/2024, 9:07 AM

## 2024-07-19 NOTE — Progress Notes (Signed)
 PHARMACY - ANTICOAGULATION CONSULT NOTE  Pharmacy Consult for heparin  Indication: pulmonary embolus  Allergies  Allergen Reactions   Penicillins Hives   Lucentis [Ranibizumab] Rash    Patient Measurements: Height: 6' 1 (185.4 cm) Weight: 82.3 kg (181 lb 7 oz) (with artic sun pads) IBW/kg (Calculated) : 79.9 HEPARIN  DW (KG): 79.4  Vital Signs: Temp: 98.2 F (36.8 C) (10/28 1942) Temp Source: Axillary (10/28 1942) BP: 105/39 (10/28 1900) Pulse Rate: 99 (10/28 1900)  Labs: Recent Labs    07/17/24 0425 07/17/24 1352 07/17/24 1451 07/17/24 1453 07/17/24 2140 07/17/24 2145 07/18/24 0417 07/18/24 0425 07/18/24 1616 07/18/24 1707 07/18/24 1949 07/18/24 2002 07/18/24 2306 07/19/24 0113 07/19/24 0155 07/19/24 0800 07/19/24 1453 07/19/24 1849  HGB 12.4*   < > 10.5*   < > 13.4   < > 12.9*   < > 10.5*   < > 8.5*  --   --  7.1* 9.0*  --   --   --   HCT 35.6*   < > 31.5*   < > 38.5*   < > 36.8*   < > 29.6*   < > 25.0*  --   --  21.0* 26.3*  --   --   --   PLT 208  --  86*  --  211  --  199  --  125*  --   --   --   --   --  122*  --   --   --   APTT  --   --  49*  --   --   --   --   --  >200*  --   --  >200*  --   --   --   --   --   --   LABPROT 14.1  --  20.4*  --  20.9*  --   --   --   --   --   --   --   --   --   --   --   --   --   INR 1.0  --  1.7*  --  1.7*  --   --   --   --   --   --   --   --   --   --   --   --   --   HEPARINUNFRC  --   --  <0.10*  --   --    < >  --    < >  --   --   --  >1.10* 0.47  --   --  0.20*  --  0.33  CREATININE 0.99  --  1.50*  --  2.37*  --  2.90*  --  3.69*  --   --   --   --   --  4.18*  --  5.16*  --   CKTOTAL 938*  --   --   --   --   --   --   --   --   --   --   --   --   --   --   --   --   --    < > = values in this interval not displayed.    Estimated Creatinine Clearance: 13.8 mL/min (A) (by C-G formula based on SCr of 5.16 mg/dL (H)).   Assessment: Lawrence [name] is a 76 YOM admitted with concern for spinal cord injury. Patient  taken to  the OR for neurosurgery, procedure complicated by multiple rounds of CPR.   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 S/p IR 10/27 for thrombectomy.  Pharmacy dosing IV heparin  -heparin  level= 0.33 on 850 units/hr  Goal of Therapy:  Heparin  level 0.3-0.7 units/ml Monitor platelets by anticoagulation protocol: Yes   Plan:  Increase heparin  infusion to 900 units/hr Heparin  level and CBC in am  Prentice Poisson, PharmD Clinical Pharmacist **Pharmacist phone directory can now be found on [email or web address] ([email or web address]).  Listed under [facility].

## 2024-07-19 NOTE — Plan of Care (Signed)
 Patient is alert and following commands. Has been on pressure support ventilation for almost an hour. His RSBI is 42. Saturations 100% on 40% FiO2 PEEP of 5. We shall extubate to nasal cannula.  Tamela Stakes, MD  Attending Physician, Critical Care Medicine [facility] See Amion for pager If no response to pager, please call [phone] until 7pm After 7pm, Please call E-link [phone]

## 2024-07-19 NOTE — Plan of Care (Signed)
 Patient continues to remain anuric. Repeat labs showed slightly increased creatinine and BUN.  Potassium and bicarb are normal. At this stage he has been on IV fluids of 100 cc bolus in the morning and 75 cc an hour.  This did not help.  Imp: AKI/ATN  - Stop IV fluids -Discussed with nephrology agreeable for us  to place HD catheter -No absolute indication for dialysis right now but if things deteriorate he is willing to place the patient on HD or CRRT overnight. -Closely monitor renal function.  Tamela Stakes, MD  Attending Physician, Critical Care Medicine [place] [facility] See Amion for pager If no response to pager, please call [phone] until 7pm After 7pm, Please call E-link [phone]

## 2024-07-19 NOTE — Progress Notes (Signed)
 Interim CCM Progress Note AKI/AKN  Attempted placing HD cath left femoral site, able to stick the left femoral twice, vein very collapsible. Patient appearing intravascularly dry.Patient requiring levophed infusion as well. Notified MD Mohammad in regards to.  Unable to place in left or right internal jugular due to cervical brace. Unable to place in right femoral due to previous IR intervention.  P: Give another 500 cc bolus over next couple hours  Obtain Repeat renal function at 2000 Reassess for placing HD cath after, patient not needing emergent CRRT at this time, electrolytes, acid base status currently stable per previous BMET   Christian Vu Liebman AGACNP-BC   [facility] 07/19/2024, 5:30 PM  Please see [email or web address] for pager details.  From 7A-7P if no response, please call [phone]. After hours, please call ELink [phone].

## 2024-07-19 NOTE — Progress Notes (Signed)
    Providing Compassionate, Quality Care - Together   NEUROSURGERY PROGRESS NOTE     S: NAEs o/n. S/p thrombectomy. Extubation trial today per CCM.    O: EXAM:  BP 135/60   Pulse 100   Temp 98.2 F (36.8 C)   Resp 17   Ht 6' 1 (1.854 m)   Wt 82.3 kg Comment: with artic sun pads  SpO2 100%   BMI 23.94 kg/m     Intubated, eyes open to voice Nods to questions PERRL FC BUE, 4/5 strength grossly 0/5 BLE SILT BUE Dressing w/ dried blood R lateral portion Incision appears well approximated with staples Mildly firm to touch inferior width of incision   ASSESSMENT:  78 y.o. with partial C7-T1 discectomy for SCI, aborted due to arrest/PEA.     PLAN: -Supportive care per CCM, appreciate mgmt.  -Continue collar.  -Continue to monitor hemodynamic stability in regard to potential intervention C spine.    Camie Pickle, [facility]

## 2024-07-20 ENCOUNTER — Encounter (HOSPITAL_COMMUNITY): Payer: Self-pay

## 2024-07-20 DIAGNOSIS — N179 Acute kidney failure, unspecified: Secondary | ICD-10-CM

## 2024-07-20 DIAGNOSIS — I5082 Biventricular heart failure: Secondary | ICD-10-CM

## 2024-07-20 DIAGNOSIS — I469 Cardiac arrest, cause unspecified: Secondary | ICD-10-CM | POA: Diagnosis not present

## 2024-07-20 DIAGNOSIS — K7200 Acute and subacute hepatic failure without coma: Secondary | ICD-10-CM

## 2024-07-20 DIAGNOSIS — R579 Shock, unspecified: Secondary | ICD-10-CM | POA: Diagnosis not present

## 2024-07-20 DIAGNOSIS — I2699 Other pulmonary embolism without acute cor pulmonale: Secondary | ICD-10-CM | POA: Diagnosis not present

## 2024-07-20 DIAGNOSIS — J189 Pneumonia, unspecified organism: Secondary | ICD-10-CM

## 2024-07-20 DIAGNOSIS — S14107D Unspecified injury at C7 level of cervical spinal cord, subsequent encounter: Secondary | ICD-10-CM

## 2024-07-20 LAB — CBC
HCT: 20.8 % — ABNORMAL LOW (ref 39.0–52.0)
Hemoglobin: 7 g/dL — ABNORMAL LOW (ref 13.0–17.0)
MCH: 32.3 pg (ref 26.0–34.0)
MCHC: 33.7 g/dL (ref 30.0–36.0)
MCV: 95.9 fL (ref 80.0–100.0)
Platelets: 123 K/uL — ABNORMAL LOW (ref 150–400)
RBC: 2.17 MIL/uL — ABNORMAL LOW (ref 4.22–5.81)
RDW: 11.5 % (ref 11.5–15.5)
WBC: 12.4 K/uL — ABNORMAL HIGH (ref 4.0–10.5)
nRBC: 0.2 % (ref 0.0–0.2)

## 2024-07-20 LAB — RENAL FUNCTION PANEL
Albumin: 1.9 g/dL — ABNORMAL LOW (ref 3.5–5.0)
Anion gap: 14 (ref 5–15)
BUN: 88 mg/dL — ABNORMAL HIGH (ref 8–23)
CO2: 21 mmol/L — ABNORMAL LOW (ref 22–32)
Calcium: 7.4 mg/dL — ABNORMAL LOW (ref 8.9–10.3)
Chloride: 100 mmol/L (ref 98–111)
Creatinine, Ser: 5.73 mg/dL — ABNORMAL HIGH (ref 0.61–1.24)
GFR, Estimated: 10 mL/min — ABNORMAL LOW (ref >60)
Glucose, Bld: 158 mg/dL — ABNORMAL HIGH (ref 70–99)
Phosphorus: 6.3 mg/dL — ABNORMAL HIGH (ref 2.5–4.6)
Potassium: 5 mmol/L (ref 3.5–5.1)
Sodium: 135 mmol/L (ref 135–145)

## 2024-07-20 LAB — BASIC METABOLIC PANEL WITH GFR
Anion gap: 12 (ref 5–15)
BUN: 84 mg/dL — ABNORMAL HIGH (ref 8–23)
CO2: 24 mmol/L (ref 22–32)
Calcium: 7.9 mg/dL — ABNORMAL LOW (ref 8.9–10.3)
Chloride: 101 mmol/L (ref 98–111)
Creatinine, Ser: 6.1 mg/dL — ABNORMAL HIGH (ref 0.61–1.24)
GFR, Estimated: 9 mL/min — ABNORMAL LOW (ref >60)
Glucose, Bld: 131 mg/dL — ABNORMAL HIGH (ref 70–99)
Potassium: 4.7 mmol/L (ref 3.5–5.1)
Sodium: 137 mmol/L (ref 135–145)

## 2024-07-20 LAB — HEMOGLOBIN AND HEMATOCRIT, BLOOD
HCT: 19.4 % — ABNORMAL LOW (ref 39.0–52.0)
HCT: 23.3 % — ABNORMAL LOW (ref 39.0–52.0)
Hemoglobin: 6.6 g/dL — CL (ref 13.0–17.0)
Hemoglobin: 7.9 g/dL — ABNORMAL LOW (ref 13.0–17.0)

## 2024-07-20 LAB — COOXEMETRY PANEL
Carboxyhemoglobin: 1.8 % — ABNORMAL HIGH (ref 0.5–1.5)
Methemoglobin: <0.7 % (ref 0.0–1.5)
O2 Saturation: 63.1 %
Total hemoglobin: <6.9 g/dL — CL (ref 12.0–16.0)

## 2024-07-20 LAB — GLUCOSE, CAPILLARY
Glucose-Capillary: 134 mg/dL — ABNORMAL HIGH (ref 70–99)
Glucose-Capillary: 139 mg/dL — ABNORMAL HIGH (ref 70–99)
Glucose-Capillary: 147 mg/dL — ABNORMAL HIGH (ref 70–99)
Glucose-Capillary: 156 mg/dL — ABNORMAL HIGH (ref 70–99)
Glucose-Capillary: 167 mg/dL — ABNORMAL HIGH (ref 70–99)
Glucose-Capillary: 196 mg/dL — ABNORMAL HIGH (ref 70–99)

## 2024-07-20 LAB — HEPARIN LEVEL (UNFRACTIONATED)
Heparin Unfractionated: 0.26 [IU]/mL — ABNORMAL LOW (ref 0.30–0.70)
Heparin Unfractionated: 0.34 [IU]/mL (ref 0.30–0.70)

## 2024-07-20 LAB — CULTURE, RESPIRATORY W GRAM STAIN: Culture: NO GROWTH

## 2024-07-20 LAB — PREPARE RBC (CROSSMATCH)

## 2024-07-20 MED ORDER — SODIUM CHLORIDE 0.9 % IV SOLN
2.0000 g | INTRAVENOUS | Status: AC
  Administered 2024-07-20 – 2024-07-24 (×5): 2 g via INTRAVENOUS
  Filled 2024-07-20 (×5): qty 20

## 2024-07-20 MED ORDER — PROSOURCE TF20 ENFIT COMPATIBL EN LIQD
60.0000 mL | Freq: Three times a day (TID) | ENTERAL | Status: DC
  Administered 2024-07-20 – 2024-07-26 (×18): 60 mL
  Filled 2024-07-20 (×17): qty 60

## 2024-07-20 MED ORDER — PROSOURCE TF20 ENFIT COMPATIBL EN LIQD: 60.0000 mL | Freq: Every day | ENTERAL | Status: DC

## 2024-07-20 MED ORDER — PRISMASOL BGK 4/2.5 32-4-2.5 MEQ/L EC SOLN: Status: DC

## 2024-07-20 MED ORDER — SODIUM CHLORIDE 0.9 % FOR CRRT
INTRAVENOUS_CENTRAL | Status: DC | PRN
Start: 2024-07-20 — End: 2024-07-30

## 2024-07-20 MED ORDER — RENA-VITE PO TABS
1.0000 | ORAL_TABLET | Freq: Every day | ORAL | Status: DC
  Administered 2024-07-20 – 2024-08-01 (×13): 1
  Filled 2024-07-20 (×15): qty 1

## 2024-07-20 MED ORDER — VITAL 1.5 CAL PO LIQD
1000.0000 mL | ORAL | Status: DC
  Administered 2024-07-20 – 2024-07-25 (×7): 1000 mL

## 2024-07-20 MED ORDER — HEPARIN SODIUM (PORCINE) 1000 UNIT/ML DIALYSIS
1000.0000 [IU] | INTRAMUSCULAR | Status: DC | PRN
  Administered 2024-07-26: 2800 [IU] via INTRAVENOUS_CENTRAL
  Filled 2024-07-20: qty 6

## 2024-07-20 MED ORDER — PHENOL 1.4 % MT LIQD
1.0000 | OROMUCOSAL | Status: DC | PRN
  Administered 2024-07-20 – 2024-07-28 (×2): 1 via OROMUCOSAL
  Filled 2024-07-20 (×2): qty 177

## 2024-07-20 MED ORDER — SODIUM CHLORIDE 0.9% IV SOLUTION: Freq: Once | INTRAVENOUS | Status: AC

## 2024-07-20 MED ORDER — GUAIFENESIN 100 MG/5ML PO LIQD
5.0000 mL | ORAL | Status: DC | PRN
  Administered 2024-07-20 – 2024-07-29 (×20): 5 mL via ORAL
  Filled 2024-07-20 (×15): qty 5
  Filled 2024-07-20: qty 10
  Filled 2024-07-20 (×4): qty 5

## 2024-07-20 MED ORDER — SODIUM CHLORIDE 0.9 % IV SOLN: 2.0000 g | Freq: Two times a day (BID) | INTRAVENOUS | Status: DC

## 2024-07-20 NOTE — Progress Notes (Signed)
 PHARMACY - ANTICOAGULATION CONSULT NOTE  Pharmacy Consult for heparin  Indication: pulmonary embolus  Allergies  Allergen Reactions   Penicillins Hives   Lucentis [Ranibizumab] Rash    Patient Measurements: Height: 6' 1 (185.4 cm) Weight: 82.3 kg (181 lb 7 oz) (with artic sun pads) IBW/kg (Calculated) : 79.9 HEPARIN  DW (KG): 79.4  Vital Signs: Temp: 98.7 F (37.1 C) (10/29 0315) Temp Source: Axillary (10/29 0315) BP: 143/55 (10/29 0730) Pulse Rate: 87 (10/29 0745)  Labs: Recent Labs    07/17/24 1451 07/17/24 1453 07/17/24 2140 07/17/24 2145 07/18/24 1616 07/18/24 1707 07/18/24 2002 07/18/24 2306 07/19/24 0113 07/19/24 0155 07/19/24 0800 07/19/24 1453 07/19/24 1849 07/19/24 2100 07/20/24 0513  HGB 10.5*   < > 13.4   < > 10.5*   < >  --   --  7.1* 9.0*  --   --   --   --  7.0*  HCT 31.5*   < > 38.5*   < > 29.6*   < >  --   --  21.0* 26.3*  --   --   --   --  20.8*  PLT 86*  --  211   < > 125*  --   --   --   --  122*  --   --   --   --  123*  APTT 49*  --   --   --  >200*  --  >200*  --   --   --   --   --   --   --   --   LABPROT 20.4*  --  20.9*  --   --   --   --   --   --   --   --   --   --   --   --   INR 1.7*  --  1.7*  --   --   --   --   --   --   --   --   --   --   --   --   HEPARINUNFRC <0.10*  --   --    < >  --   --  >1.10*   < >  --   --  0.20*  --  0.33  --  0.26*  CREATININE 1.50*  --  2.37*   < > 3.69*  --   --   --   --  4.18*  --  5.16*  --  5.41* 6.10*   < > = values in this interval not displayed.    Estimated Creatinine Clearance: 11.6 mL/min (A) (by C-G formula based on SCr of 6.1 mg/dL (H)).   Assessment: Lawrence [name] is a 39 YOM admitted with concern for spinal cord injury. Patient taken to the OR for neurosurgery, procedure complicated by multiple rounds of CPR.   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 S/p IR 10/27 for thrombectomy.  Heparin  level 0.26 is slightly subtherapeutic with  heparin  running at 900 units/hr. Hgb (7.0) is decreased from yesterday. PLTs (123) are stable. Per RN, no report of pauses, issues with the line, or signs of bleeding. No apparent reason for drop in hemoglobin, will continue to monitor closely.  Goal of Therapy:  Heparin  level 0.3-0.7 units/ml Monitor platelets by anticoagulation protocol: Yes   Plan:  Increase heparin  infusion to 1000 units/hr Check 8 hour heparin  level Monitor daily heparin  level, CBC, and signs/symptoms of bleeding  Thank you for allowing pharmacy to be a part of this patient's care.   Nidia Schaffer, PharmD PGY2 Cardiology Pharmacy Resident  Please check AMION for all [facility] Pharmacy phone numbers After 10:00 PM, call Main Pharmacy [phone] 07/20/24 10:19 AM

## 2024-07-20 NOTE — Procedures (Signed)
 Cortrak  Person Inserting Tube:  Lawrence [name], RD Tube Type:  Cortrak - 43 inches Tube Size:  10 Tube Location:  Left nare Secured by: Bridle Initial Placement:  Gastric Technique Used to Measure Tube Placement:  Marking at nare/corner of mouth Cortrak Secured At:  69 cm Initial Placement Verification:  Cortrak device (Registered Dieticians Only)  Cortrak Tube Team Note:  Consult received to place a Cortrak feeding tube.   No x-ray is required. RN may begin using tube.   If the tube becomes dislodged please keep the tube and contact the Cortrak team at [email or web address] for replacement.  If after hours and replacement cannot be delayed, place a NG tube and confirm placement with an abdominal x-ray.    Lawrence Elihue, MS, RDN, LDN Clinical Dietitian I Please reach out via secure chat

## 2024-07-20 NOTE — Progress Notes (Signed)
 PHARMACY - ANTICOAGULATION CONSULT NOTE  Pharmacy Consult for heparin  Indication: pulmonary embolus  Allergies  Allergen Reactions   Penicillins Hives   Lucentis [Ranibizumab] Rash    Patient Measurements: Height: 6' 1 (185.4 cm) Weight: 82.3 kg (181 lb 7 oz) (with artic sun pads) IBW/kg (Calculated) : 79.9 HEPARIN  DW (KG): 79.4  Vital Signs: Temp: 99.4 F (37.4 C) (10/29 1947) Temp Source: Axillary (10/29 1947) BP: 115/46 (10/29 1630) Pulse Rate: 93 (10/29 1915)  Labs: Recent Labs    07/17/24 2140 07/17/24 2145 07/18/24 1616 07/18/24 1707 07/18/24 2002 07/18/24 2306 07/19/24 0155 07/19/24 0800 07/19/24 1849 07/19/24 2100 07/20/24 0513 07/20/24 1129 07/20/24 1639 07/20/24 1725 07/20/24 1849  HGB 13.4   < > 10.5*   < >  --    < > 9.0*  --   --   --  7.0* 6.6*  --  7.9*  --   HCT 38.5*   < > 29.6*   < >  --    < > 26.3*  --   --   --  20.8* 19.4*  --  23.3*  --   PLT 211   < > 125*  --   --   --  122*  --   --   --  123*  --   --   --   --   APTT  --   --  >200*  --  >200*  --   --   --   --   --   --   --   --   --   --   LABPROT 20.9*  --   --   --   --   --   --   --   --   --   --   --   --   --   --   INR 1.7*  --   --   --   --   --   --   --   --   --   --   --   --   --   --   HEPARINUNFRC  --    < >  --   --  >1.10*   < >  --    < > 0.33  --  0.26*  --   --   --  0.34  CREATININE 2.37*   < > 3.69*  --   --   --  4.18*   < >  --  5.41* 6.10*  --  5.73*  --   --    < > = values in this interval not displayed.    Estimated Creatinine Clearance: 12.4 mL/min (A) (by C-G formula based on SCr of 5.73 mg/dL (H)).   Assessment: JC is a 81 YOM admitted with concern for spinal cord injury. Patient taken to the OR for neurosurgery, procedure complicated by multiple rounds of CPR.   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 S/p IR 10/27 for thrombectomy.  10/29 PM: Heparin  level therapeutic at 0.34. No issues  noted.   Goal of Therapy:  Heparin  level 0.3-0.7 units/ml Monitor platelets by anticoagulation protocol: Yes   Plan:  Continue heparin  infusion at 1000 units/hr Check confirmatory heparin  level with AM labs, Monitor daily heparin  level, CBC, and signs/symptoms of bleeding  Thank you for allowing pharmacy to be a part of this patient's care.   Larraine Brazier, PharmD Clinical Pharmacist 07/20/2024  8:17 PM **Pharmacist phone directory can now be found on [email or web address] ([email or web address]).  Listed under [facility].

## 2024-07-20 NOTE — Progress Notes (Signed)
 Neurosurgery  On Rodriguez Hevia, NAD. C/o some neck pain.  Some dysphonia Neck soft, slight firm.  No oozing from neck.  No movement in legs, insensate below trunk. Deconditioning limits hand strength, currently 2/5 strength. On Levophed gtt  - continue c-collar - will possibly need crrt or HD for renal failure - depending on degree of clinical improvement, will reassess if he is a candidate for completion of his 7-T1 ACDF eventually.

## 2024-07-20 NOTE — Procedures (Signed)
 Central Venous Catheter Insertion Procedure Note  Donovyn Guidice  [id number]  [birth year]  Date:07/20/24  Time:12:53 PM   Provider Performing:Starlena Beil H Mystique Bjelland   Procedure: Insertion of Non-tunneled Central Venous Catheter(36556)with US  guidance (23062)    Indication(s) Hemodialysis  Consent Risks of the procedure as well as the alternatives and risks of each were explained to the patient and/or caregiver.  Consent for the procedure was obtained and is signed in the bedside chart  Anesthesia Topical only with 1% lidocaine    Timeout Verified patient identification, verified procedure, site/side was marked, verified correct patient position, special equipment/implants available, medications/allergies/relevant history reviewed, required imaging and test results available.  Sterile Technique Maximal sterile technique including full sterile barrier drape, hand hygiene, sterile gown, sterile gloves, mask, hair covering, sterile ultrasound probe cover (if used).  Procedure Description Area of catheter insertion was cleaned with chlorhexidine  and draped in sterile fashion.   With real-time ultrasound guidance a HD catheter was placed into the left femoral vein.  Nonpulsatile blood flow and easy flushing noted in all ports.  The catheter was sutured in place and sterile dressing applied.  Complications/Tolerance None; patient tolerated the procedure well. Chest X-ray is ordered to verify placement for internal jugular or subclavian cannulation.  Chest x-ray is not ordered for femoral cannulation.  EBL Minimal  Specimen(s) None     Warren Shade, DNP, AGACNP-BC [facility]  Please see [email or web address] for pager details.  From 7A-7P if no response, please call [phone]. After hours, please call ELink [phone].

## 2024-07-20 NOTE — Progress Notes (Signed)
 Lawrence [name] is an 78 y.o. male diabetes, hypertension, BPH status post TURP initially presenting after a ground-level fall about 5 days prior to admission. Of note over the past few weeks he has had neuropathy in his feet, felt more unsteady and used a cane because of his neuropathy.  Unfortunately he slipped and fell and hit his head on the sink followed by new onset numbness in his legs, inability to sit up and loss of function in his legs as well as urinary incontinence.  He was to have a partial anterior cervical discectomy C7-T1 to stabilize even though he was not likely to regain strength in his lower extremities but during the operative course he developed PEA arrest x 2 with subsequent CT scan showing a massive bilateral PE with poor lung perfusion.  Patient also had successful thrombectomy performed by VIR removing a large clot burden at the right PA bifurcation as well as a large clot burden in the left lower lung lobe. BL cr is approximately 0.9-1.1 (12/02/22) but with his complicated course he has developed renal failure with very poor urine output.  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 - He does not want long-term dialysis but is willing to give a trial with short-term dialysis to give his kidneys a chance to recover -Still anuric with K starting to trend up; will start CRRT. Very labile pressures when Levophed is titrated down (even tho only on 4mg ).   -Monitor Daily I/Os, Daily weight  -Maintain MAP>65 for optimal renal perfusion.  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 Acute  respiratory failure -off sedation and alert, and possible extubation trial pending weaning parameters Cardiogenic shock secondary to pulmonary embolus -off epinephrine, still on Levophed by cardiology  Subjective: Denies fever, chills, nausea, SOB; children and wife were bedside (updated).    Chemistry and CBC: Creatinine  Date/Time Value Ref Range Status  02/21/2021 12:00 AM 1.2 0.6 - 1.3 Final  10/27/2020 12:00 AM 0.9 0.6 - 1.3 Final   Creatinine, Ser  Date/Time Value Ref Range Status  07/20/2024 05:13 AM 6.10 (H) 0.61 - 1.24 mg/dL Final  89/71/7974 90:99 PM 5.41 (H) 0.61 - 1.24 mg/dL Final  89/71/7974 97:46 PM 5.16 (H) 0.61 - 1.24 mg/dL Final  89/71/7974 98:44 AM 4.18 (H) 0.61 - 1.24 mg/dL Final  89/72/7974 95:83 PM 3.69 (H) 0.61 - 1.24 mg/dL Final  89/72/7974 95:82 AM 2.90 (H) 0.61 - 1.24 mg/dL Final  89/73/7974 90:59 PM 2.37 (H) 0.61 - 1.24 mg/dL Final  89/73/7974 97:48 PM 1.50 (H) 0.61 - 1.24 mg/dL Final  89/73/7974 95:74 AM 0.99 0.61 - 1.24 mg/dL Final  89/74/7974 92:40 PM 1.08 0.61 - 1.24 mg/dL Final  96/87/7975 89:41 AM 1.02 0.61 - 1.24 mg/dL Final  97/79/7976 98:95 PM 0.92 0.61 - 1.24 mg/dL Final  95/96/7977 93:65 PM 0.86 0.61 - 1.24 mg/dL Final   Recent Labs  Lab 07/17/24 0425 07/17/24 1352 07/17/24 2140 07/17/24 2145 07/18/24 0417 07/18/24 0425 07/18/24 1616 07/18/24 1707 07/18/24 1815 07/18/24 1949 07/19/24 0113 07/19/24 0155 07/19/24 1453 07/19/24 2100 07/20/24 0513  NA 135   < > 138   < >  134*   < > 138   < > 137 136 140 135 135 137 137  K 3.6   < > 4.1   < > 3.7   < > 3.2*   < > 3.0* 3.2* 3.3* 3.8 4.4 4.6 4.7  CL 98   < > 98  --  100  --  103  --   --   --   --  100 101 101 101  CO2 24   < > 19*  --  19*  --  19*  --   --   --   --  22 24 24 24   GLUCOSE 180*   < > 393*  --  372*  --  144*  --   --   --   --  137* 131* 123* 131*  BUN 22   < > 35*  --  41*  --  50*  --   --   --   --  56* 69* 73* 84*  CREATININE 0.99   < > 2.37*  --  2.90*  --  3.69*   --   --   --   --  4.18* 5.16* 5.41* 6.10*  CALCIUM 9.0   < > 9.3  --  8.6*  --  8.3*  --   --   --   --  8.0* 7.7* 7.6* 7.9*  PHOS 3.4  --   --   --   --   --   --   --   --   --   --   --   --  6.4*  --    < > = values in this interval not displayed.   Recent Labs  Lab 07/16/24 1959 07/17/24 0425 07/18/24 0417 07/18/24 0425 07/18/24 1616 07/18/24 1707 07/18/24 1949 07/19/24 0113 07/19/24 0155 07/20/24 0513  WBC 12.0*   < > 16.1*  --  16.4*  --   --   --  14.0* 12.4*  NEUTROABS 10.3*  --   --   --   --   --   --   --   --   --   HGB 14.7   < > 12.9*   < > 10.5*   < > 8.5* 7.1* 9.0* 7.0*  HCT 40.6   < > 36.8*   < > 29.6*   < > 25.0* 21.0* 26.3* 20.8*  MCV 89.6   < > 90.9  --  89.4  --   --   --  92.6 95.9  PLT 253   < > 199  --  125*  --   --   --  122* 123*   < > = values in this interval not displayed.   Liver Function Tests: Recent Labs  Lab 07/16/24 1959 07/18/24 0417 07/19/24 0155 07/19/24 2100  AST 46* 1,476* 622*  --   ALT 20 978* 673*  --   ALKPHOS 103 58 50  --   BILITOT 0.7 1.3* 2.3*  --   PROT 7.3 4.3* 3.9*  --   ALBUMIN 4.3 2.1* 1.9* 1.8*   Recent Labs  Lab 07/18/24 0417  LIPASE 29   No results for input(s): AMMONIA in the last 168 hours. Cardiac Enzymes: Recent Labs  Lab 07/16/24 1959 07/17/24 0425  CKTOTAL 1,321* 938*   Iron Studies: No results for input(s): IRON, TIBC, TRANSFERRIN, FERRITIN in the last 72 hours. PT/INR: @LABRCNTIP (inr:5)  Xrays/Other Studies: ) Results for orders placed or performed  during the hospital encounter of 07/16/24 (from the past 48 hours)  Culture, blood (Routine X 2) w Reflex to ID Panel     Status: None (Preliminary result)   Collection Time: 07/18/24 10:51 AM   Specimen: BLOOD RIGHT HAND  Result Value Ref Range   Specimen Description BLOOD RIGHT HAND    Special Requests      BOTTLES DRAWN AEROBIC AND ANAEROBIC Blood Culture results may not be optimal due to an inadequate volume of blood received in  culture bottles   Culture      NO GROWTH 2 DAYS Performed at [facility], [address] [place], [place] [address]    Report Status PENDING   Glucose, capillary     Status: Abnormal   Collection Time: 07/18/24 11:21 AM  Result Value Ref Range   Glucose-Capillary 133 (H) 70 - 99 mg/dL    Comment: Glucose reference range applies only to samples taken after fasting for at least 8 hours.  Glucose, capillary     Status: Abnormal   Collection Time: 07/18/24 12:26 PM  Result Value Ref Range   Glucose-Capillary 129 (H) 70 - 99 mg/dL    Comment: Glucose reference range applies only to samples taken after fasting for at least 8 hours.  Culture, blood (Routine X 2) w Reflex to ID Panel     Status: None (Preliminary result)   Collection Time: 07/18/24 12:32 PM   Specimen: BLOOD LEFT HAND  Result Value Ref Range   Specimen Description BLOOD LEFT HAND    Special Requests      BOTTLES DRAWN AEROBIC ONLY Blood Culture adequate volume   Culture      NO GROWTH 2 DAYS Performed at [facility], [address], [place], [place] [address]    Report Status PENDING   Glucose, capillary     Status: Abnormal   Collection Time: 07/18/24  1:32 PM  Result Value Ref Range   Glucose-Capillary 124 (H) 70 - 99 mg/dL    Comment: Glucose reference range applies only to samples taken after fasting for at least 8 hours.  Glucose, capillary     Status: Abnormal   Collection Time: 07/18/24  2:43 PM  Result Value Ref Range   Glucose-Capillary 109 (H) 70 - 99 mg/dL    Comment: Glucose reference range applies only to samples taken after fasting for at least 8 hours.  POCT Activated clotting time     Status: None   Collection Time: 07/18/24  3:07 PM  Result Value Ref Range   Activated Clotting Time 233 seconds    Comment: Reference range 74-137 seconds for patients not on anticoagulant therapy.  I-STAT 7, (LYTES, BLD GAS, ICA, H+H)     Status: Abnormal   Collection Time: 07/18/24  4:12 PM  Result  Value Ref Range   pH, Arterial 7.636 (HH) 7.35 - 7.45   pCO2 arterial 18.4 (LL) 32 - 48 mmHg   pO2, Arterial 158 (H) 83 - 108 mmHg   Bicarbonate 20.0 20.0 - 28.0 mmol/L   TCO2 21 (L) 22 - 32 mmol/L   O2 Saturation 100 %   Acid-base deficit 1.0 0.0 - 2.0 mmol/L   Sodium 137 135 - 145 mmol/L   Potassium 3.1 (L) 3.5 - 5.1 mmol/L   Calcium, Ion 1.18 1.15 - 1.40 mmol/L   HCT 28.0 (L) 39.0 - 52.0 %   Hemoglobin 9.5 (L) 13.0 - 17.0 g/dL   Patient temperature 65.6 C  Sample type ARTERIAL   Glucose, capillary     Status: Abnormal   Collection Time: 07/18/24  4:15 PM  Result Value Ref Range   Glucose-Capillary 157 (H) 70 - 99 mg/dL    Comment: Glucose reference range applies only to samples taken after fasting for at least 8 hours.  Lactic acid, plasma     Status: Abnormal   Collection Time: 07/18/24  4:16 PM  Result Value Ref Range   Lactic Acid, Venous 3.2 (HH) 0.5 - 1.9 mmol/L    Comment: CRITICAL VALUE NOTED.  VALUE IS CONSISTENT WITH PREVIOUSLY REPORTED AND CALLED VALUE. Performed at [facility], [address] [place], [place] [address]   CBC     Status: Abnormal   Collection Time: 07/18/24  4:16 PM  Result Value Ref Range   WBC 16.4 (H) 4.0 - 10.5 K/uL   RBC 3.31 (L) 4.22 - 5.81 MIL/uL   Hemoglobin 10.5 (L) 13.0 - 17.0 g/dL   HCT 70.3 (L) 60.9 - 47.9 %   MCV 89.4 80.0 - 100.0 fL   MCH 31.7 26.0 - 34.0 pg   MCHC 35.5 30.0 - 36.0 g/dL   RDW 88.6 (L) 88.4 - 84.4 %   Platelets 125 (L) 150 - 400 K/uL   nRBC 0.0 0.0 - 0.2 %    Comment: Performed at [facility], [address], [place], [place] [address]  Basic metabolic panel     Status: Abnormal   Collection Time: 07/18/24  4:16 PM  Result Value Ref Range   Sodium 138 135 - 145 mmol/L   Potassium 3.2 (L) 3.5 - 5.1 mmol/L   Chloride 103 98 - 111 mmol/L   CO2 19 (L) 22 - 32 mmol/L   Glucose, Bld 144 (H) 70 - 99 mg/dL    Comment: Glucose reference range applies only to samples taken after fasting for at least 8  hours.   BUN 50 (H) 8 - 23 mg/dL   Creatinine, Ser 6.30 (H) 0.61 - 1.24 mg/dL   Calcium 8.3 (L) 8.9 - 10.3 mg/dL   GFR, Estimated 16 (L) >60 mL/min    Comment: (NOTE) Calculated using the CKD-EPI Creatinine Equation (2021)    Anion gap 16 (H) 5 - 15    Comment: Performed at [facility], [address] [place], [place] [address]  Magnesium     Status: None   Collection Time: 07/18/24  4:16 PM  Result Value Ref Range   Magnesium 1.9 1.7 - 2.4 mg/dL    Comment: Performed at [facility], [address] [place], [place] [address]  APTT     Status: Abnormal   Collection Time: 07/18/24  4:16 PM  Result Value Ref Range   aPTT >200 (HH) 24 - 36 seconds    Comment:        IF BASELINE aPTT IS ELEVATED, SUGGEST PATIENT RISK ASSESSMENT BE USED TO DETERMINE APPROPRIATE ANTICOAGULANT THERAPY. REPEATED TO VERIFY CRITICAL RESULT CALLED TO, READ BACK BY AND VERIFIED WITH: GAITHER SQUIBB., RN @ [phone] 07/18/2024 SORTOG Performed at [facility], [address], [place], [place] [address]   Urinalysis, Routine w reflex microscopic -Urine, Clean Catch     Status: Abnormal   Collection Time: 07/18/24  5:07 PM  Result Value Ref Range   Color, Urine YELLOW YELLOW   APPearance TURBID (A) CLEAR   Specific Gravity, Urine 1.026 1.005 - 1.030   pH 5.0 5.0 - 8.0  Glucose, UA >=500 (A) NEGATIVE mg/dL   Hgb urine dipstick MODERATE (A) NEGATIVE   Bilirubin Urine NEGATIVE NEGATIVE   Ketones, ur NEGATIVE NEGATIVE mg/dL   Protein, ur 899 (A) NEGATIVE mg/dL   Nitrite NEGATIVE NEGATIVE   Leukocytes,Ua NEGATIVE NEGATIVE   RBC / HPF >50 0 - 5 RBC/hpf   WBC, UA 11-20 0 - 5 WBC/hpf   Bacteria, UA MANY (A) NONE SEEN   Squamous Epithelial / HPF 0-5 0 - 5 /HPF   Amorphous Crystal PRESENT    Non Squamous Epithelial 6-10 (A) NONE SEEN   Sperm, UA PRESENT     Comment: Performed at [facility], [address], [place], [place] [address]  I-STAT 7, (LYTES, BLD GAS, ICA, H+H)     Status: Abnormal    Collection Time: 07/18/24  5:07 PM  Result Value Ref Range   pH, Arterial 7.636 (HH) 7.35 - 7.45   pCO2 arterial 19.7 (LL) 32 - 48 mmHg   pO2, Arterial 130 (H) 83 - 108 mmHg   Bicarbonate 21.4 20.0 - 28.0 mmol/L   TCO2 22 22 - 32 mmol/L   O2 Saturation 100 %   Acid-Base Excess 0.0 0.0 - 2.0 mmol/L   Sodium 137 135 - 145 mmol/L   Potassium 3.0 (L) 3.5 - 5.1 mmol/L   Calcium, Ion 1.17 1.15 - 1.40 mmol/L   HCT 24.0 (L) 39.0 - 52.0 %   Hemoglobin 8.2 (L) 13.0 - 17.0 g/dL   Patient temperature 65.6 C    Sample type ARTERIAL    Comment NOTIFIED PHYSICIAN   Glucose, capillary     Status: Abnormal   Collection Time: 07/18/24  5:27 PM  Result Value Ref Range   Glucose-Capillary 126 (H) 70 - 99 mg/dL    Comment: Glucose reference range applies only to samples taken after fasting for at least 8 hours.  I-STAT 7, (LYTES, BLD GAS, ICA, H+H)     Status: Abnormal   Collection Time: 07/18/24  6:15 PM  Result Value Ref Range   pH, Arterial 7.622 (HH) 7.35 - 7.45   pCO2 arterial 22.1 (L) 32 - 48 mmHg   pO2, Arterial 124 (H) 83 - 108 mmHg   Bicarbonate 23.5 20.0 - 28.0 mmol/L   TCO2 24 22 - 32 mmol/L   O2 Saturation 100 %   Acid-Base Excess 2.0 0.0 - 2.0 mmol/L   Sodium 137 135 - 145 mmol/L   Potassium 3.0 (L) 3.5 - 5.1 mmol/L   Calcium, Ion 1.16 1.15 - 1.40 mmol/L   HCT 26.0 (L) 39.0 - 52.0 %   Hemoglobin 8.8 (L) 13.0 - 17.0 g/dL   Patient temperature 66.6 C    Sample type ARTERIAL    Comment NOTIFIED PHYSICIAN   Glucose, capillary     Status: Abnormal   Collection Time: 07/18/24  7:02 PM  Result Value Ref Range   Glucose-Capillary 120 (H) 70 - 99 mg/dL    Comment: Glucose reference range applies only to samples taken after fasting for at least 8 hours.  I-STAT 7, (LYTES, BLD GAS, ICA, H+H)     Status: Abnormal   Collection Time: 07/18/24  7:49 PM  Result Value Ref Range   pH, Arterial 7.510 (H) 7.35 - 7.45   pCO2 arterial 28.8 (L) 32 - 48 mmHg   pO2, Arterial 147 (H) 83 - 108 mmHg    Bicarbonate 23.4 20.0 - 28.0 mmol/L   TCO2 24 22 - 32 mmol/L   O2 Saturation 100 %  Acid-Base Excess 0.0 0.0 - 2.0 mmol/L   Sodium 136 135 - 145 mmol/L   Potassium 3.2 (L) 3.5 - 5.1 mmol/L   Calcium, Ion 1.19 1.15 - 1.40 mmol/L   HCT 25.0 (L) 39.0 - 52.0 %   Hemoglobin 8.5 (L) 13.0 - 17.0 g/dL   Patient temperature 64.9 C    Collection site art line    Drawn by RT    Sample type ARTERIAL   Respiratory (~20 pathogens) panel by PCR     Status: None   Collection Time: 07/18/24  8:02 PM   Specimen: Nasopharyngeal Swab; Respiratory  Result Value Ref Range   Adenovirus NOT DETECTED NOT DETECTED   Coronavirus 229E NOT DETECTED NOT DETECTED    Comment: (NOTE) The Coronavirus on the Respiratory Panel, DOES NOT test for the novel  Coronavirus (2019 nCoV)    Coronavirus HKU1 NOT DETECTED NOT DETECTED   Coronavirus NL63 NOT DETECTED NOT DETECTED   Coronavirus OC43 NOT DETECTED NOT DETECTED   Metapneumovirus NOT DETECTED NOT DETECTED   Rhinovirus / Enterovirus NOT DETECTED NOT DETECTED   Influenza A NOT DETECTED NOT DETECTED   Influenza B NOT DETECTED NOT DETECTED   Parainfluenza Virus 1 NOT DETECTED NOT DETECTED   Parainfluenza Virus 2 NOT DETECTED NOT DETECTED   Parainfluenza Virus 3 NOT DETECTED NOT DETECTED   Parainfluenza Virus 4 NOT DETECTED NOT DETECTED   Respiratory Syncytial Virus NOT DETECTED NOT DETECTED   Bordetella pertussis NOT DETECTED NOT DETECTED   Bordetella Parapertussis NOT DETECTED NOT DETECTED   Chlamydophila pneumoniae NOT DETECTED NOT DETECTED   Mycoplasma pneumoniae NOT DETECTED NOT DETECTED    Comment: Performed at [facility], [address] [place], [place] [address]  Heparin  level (unfractionated)     Status: Abnormal   Collection Time: 07/18/24  8:02 PM  Result Value Ref Range   Heparin  Unfractionated >1.10 (H) 0.30 - 0.70 IU/mL    Comment: (NOTE) The clinical reportable range upper limit is being lowered to >1.10 to align with the FDA  approved guidance for the current laboratory assay.  If heparin  results are below expected values, and patient dosage has  been confirmed, suggest follow up testing of antithrombin III levels. Performed at [facility], [address] [place], [place] [address]   APTT     Status: Abnormal   Collection Time: 07/18/24  8:02 PM  Result Value Ref Range   aPTT >200 (HH) 24 - 36 seconds    Comment:        IF BASELINE aPTT IS ELEVATED, SUGGEST PATIENT RISK ASSESSMENT BE USED TO DETERMINE APPROPRIATE ANTICOAGULANT THERAPY. REPEATED TO VERIFY CRITICAL RESULT CALLED TO, READ BACK BY AND VERIFIED WITH: MARYBETH EDISON RN @ [phone] 07/18/2024 SORTOG Performed at [facility], [address], [place], [place] [address]   Glucose, capillary     Status: Abnormal   Collection Time: 07/18/24  8:05 PM  Result Value Ref Range   Glucose-Capillary 140 (H) 70 - 99 mg/dL    Comment: Glucose reference range applies only to samples taken after fasting for at least 8 hours.  Glucose, capillary     Status: Abnormal   Collection Time: 07/18/24  9:12 PM  Result Value Ref Range   Glucose-Capillary 109 (H) 70 - 99 mg/dL    Comment: Glucose reference range applies only to samples taken after fasting for at least 8 hours.  Glucose, capillary     Status: Abnormal   Collection Time: 07/18/24  10:09 PM  Result Value Ref Range   Glucose-Capillary 115 (H) 70 - 99 mg/dL    Comment: Glucose reference range applies only to samples taken after fasting for at least 8 hours.  Heparin  level (unfractionated)     Status: None   Collection Time: 07/18/24 11:06 PM  Result Value Ref Range   Heparin  Unfractionated 0.47 0.30 - 0.70 IU/mL    Comment: (NOTE) The clinical reportable range upper limit is being lowered to >1.10 to align with the FDA approved guidance for the current laboratory assay.  If heparin  results are below expected values, and patient dosage has  been confirmed, suggest follow up testing of  antithrombin III levels. Performed at [facility], [address] [place], [place] [address]   Glucose, capillary     Status: Abnormal   Collection Time: 07/18/24 11:10 PM  Result Value Ref Range   Glucose-Capillary 136 (H) 70 - 99 mg/dL    Comment: Glucose reference range applies only to samples taken after fasting for at least 8 hours.  Glucose, capillary     Status: None   Collection Time: 07/19/24 12:05 AM  Result Value Ref Range   Glucose-Capillary 89 70 - 99 mg/dL    Comment: Glucose reference range applies only to samples taken after fasting for at least 8 hours.  Glucose, capillary     Status: None   Collection Time: 07/19/24  1:10 AM  Result Value Ref Range   Glucose-Capillary 86 70 - 99 mg/dL    Comment: Glucose reference range applies only to samples taken after fasting for at least 8 hours.  I-STAT 7, (LYTES, BLD GAS, ICA, H+H)     Status: Abnormal   Collection Time: 07/19/24  1:13 AM  Result Value Ref Range   pH, Arterial 7.461 (H) 7.35 - 7.45   pCO2 arterial 29.2 (L) 32 - 48 mmHg   pO2, Arterial 158 (H) 83 - 108 mmHg   Bicarbonate 20.8 20.0 - 28.0 mmol/L   TCO2 22 22 - 32 mmol/L   O2 Saturation 100 %   Acid-base deficit 3.0 (H) 0.0 - 2.0 mmol/L   Sodium 140 135 - 145 mmol/L   Potassium 3.3 (L) 3.5 - 5.1 mmol/L   Calcium, Ion 1.08 (L) 1.15 - 1.40 mmol/L   HCT 21.0 (L) 39.0 - 52.0 %   Hemoglobin 7.1 (L) 13.0 - 17.0 g/dL   Patient temperature 62.4 C    Sample type ARTERIAL   Triglycerides     Status: None   Collection Time: 07/19/24  1:55 AM  Result Value Ref Range   Triglycerides 146 <150 mg/dL    Comment: Performed at [facility], [address], [place], [place] [address]  CBC     Status: Abnormal   Collection Time: 07/19/24  1:55 AM  Result Value Ref Range   WBC 14.0 (H) 4.0 - 10.5 K/uL   RBC 2.84 (L) 4.22 - 5.81 MIL/uL   Hemoglobin 9.0 (L) 13.0 - 17.0 g/dL   HCT 73.6 (L) 60.9 - 47.9 %   MCV 92.6 80.0 - 100.0 fL   MCH 31.7 26.0 - 34.0 pg    MCHC 34.2 30.0 - 36.0 g/dL   RDW 88.4 88.4 - 84.4 %   Platelets 122 (L) 150 - 400 K/uL   nRBC 0.0 0.0 - 0.2 %    Comment: Performed at [facility], [address] [place], [place] [address]  Comprehensive metabolic panel with GFR  Status: Abnormal   Collection Time: 07/19/24  1:55 AM  Result Value Ref Range   Sodium 135 135 - 145 mmol/L   Potassium 3.8 3.5 - 5.1 mmol/L   Chloride 100 98 - 111 mmol/L   CO2 22 22 - 32 mmol/L   Glucose, Bld 137 (H) 70 - 99 mg/dL    Comment: Glucose reference range applies only to samples taken after fasting for at least 8 hours.   BUN 56 (H) 8 - 23 mg/dL   Creatinine, Ser 5.81 (H) 0.61 - 1.24 mg/dL   Calcium 8.0 (L) 8.9 - 10.3 mg/dL   Total Protein 3.9 (L) 6.5 - 8.1 g/dL   Albumin 1.9 (L) 3.5 - 5.0 g/dL   AST 377 (H) 15 - 41 U/L   ALT 673 (H) 0 - 44 U/L   Alkaline Phosphatase 50 38 - 126 U/L   Total Bilirubin 2.3 (H) 0.0 - 1.2 mg/dL   GFR, Estimated 14 (L) >60 mL/min    Comment: (NOTE) Calculated using the CKD-EPI Creatinine Equation (2021)    Anion gap 13 5 - 15    Comment: Performed at [facility], [address] [place], [place] [address]  Lactic acid, plasma     Status: None   Collection Time: 07/19/24  1:55 AM  Result Value Ref Range   Lactic Acid, Venous 1.4 0.5 - 1.9 mmol/L    Comment: Performed at [facility], [address], [place], [place] [address]  Glucose, capillary     Status: Abnormal   Collection Time: 07/19/24  2:00 AM  Result Value Ref Range   Glucose-Capillary 138 (H) 70 - 99 mg/dL    Comment: Glucose reference range applies only to samples taken after fasting for at least 8 hours.  Glucose, capillary     Status: Abnormal   Collection Time: 07/19/24  3:38 AM  Result Value Ref Range   Glucose-Capillary 114 (H) 70 - 99 mg/dL    Comment: Glucose reference range applies only to samples taken after fasting for at least 8 hours.  Glucose, capillary     Status: Abnormal   Collection Time: 07/19/24   5:00 AM  Result Value Ref Range   Glucose-Capillary 128 (H) 70 - 99 mg/dL    Comment: Glucose reference range applies only to samples taken after fasting for at least 8 hours.  Glucose, capillary     Status: Abnormal   Collection Time: 07/19/24  6:50 AM  Result Value Ref Range   Glucose-Capillary 111 (H) 70 - 99 mg/dL    Comment: Glucose reference range applies only to samples taken after fasting for at least 8 hours.  Heparin  level (unfractionated)     Status: Abnormal   Collection Time: 07/19/24  8:00 AM  Result Value Ref Range   Heparin  Unfractionated 0.20 (L) 0.30 - 0.70 IU/mL    Comment: (NOTE) The clinical reportable range upper limit is being lowered to >1.10 to align with the FDA approved guidance for the current laboratory assay.  If heparin  results are below expected values, and patient dosage has  been confirmed, suggest follow up testing of antithrombin III levels. Performed at [facility], [address] [place], [place] [address]   Glucose, capillary     Status: Abnormal   Collection Time: 07/19/24  8:39 AM  Result Value Ref Range   Glucose-Capillary 173 (H) 70 - 99 mg/dL    Comment: Glucose reference range applies only to samples taken after fasting  for at least 8 hours.  Glucose, capillary     Status: Abnormal   Collection Time: 07/19/24 11:09 AM  Result Value Ref Range   Glucose-Capillary 136 (H) 70 - 99 mg/dL    Comment: Glucose reference range applies only to samples taken after fasting for at least 8 hours.  Basic metabolic panel     Status: Abnormal   Collection Time: 07/19/24  2:53 PM  Result Value Ref Range   Sodium 135 135 - 145 mmol/L   Potassium 4.4 3.5 - 5.1 mmol/L   Chloride 101 98 - 111 mmol/L   CO2 24 22 - 32 mmol/L   Glucose, Bld 131 (H) 70 - 99 mg/dL    Comment: Glucose reference range applies only to samples taken after fasting for at least 8 hours.   BUN 69 (H) 8 - 23 mg/dL   Creatinine, Ser 4.83 (H) 0.61 - 1.24 mg/dL   Calcium 7.7  (L) 8.9 - 10.3 mg/dL   GFR, Estimated 11 (L) >60 mL/min    Comment: (NOTE) Calculated using the CKD-EPI Creatinine Equation (2021)    Anion gap 10 5 - 15    Comment: Performed at [facility], [address], [place], [place] [address]  Glucose, capillary     Status: Abnormal   Collection Time: 07/19/24  3:12 PM  Result Value Ref Range   Glucose-Capillary 125 (H) 70 - 99 mg/dL    Comment: Glucose reference range applies only to samples taken after fasting for at least 8 hours.  Heparin  level (unfractionated)     Status: None   Collection Time: 07/19/24  6:49 PM  Result Value Ref Range   Heparin  Unfractionated 0.33 0.30 - 0.70 IU/mL    Comment: (NOTE) The clinical reportable range upper limit is being lowered to >1.10 to align with the FDA approved guidance for the current laboratory assay.  If heparin  results are below expected values, and patient dosage has  been confirmed, suggest follow up testing of antithrombin III levels. Performed at [facility], [address] [place], [place] [address]   Glucose, capillary     Status: Abnormal   Collection Time: 07/19/24  7:39 PM  Result Value Ref Range   Glucose-Capillary 136 (H) 70 - 99 mg/dL    Comment: Glucose reference range applies only to samples taken after fasting for at least 8 hours.  Renal function panel     Status: Abnormal   Collection Time: 07/19/24  9:00 PM  Result Value Ref Range   Sodium 137 135 - 145 mmol/L   Potassium 4.6 3.5 - 5.1 mmol/L   Chloride 101 98 - 111 mmol/L   CO2 24 22 - 32 mmol/L   Glucose, Bld 123 (H) 70 - 99 mg/dL    Comment: Glucose reference range applies only to samples taken after fasting for at least 8 hours.   BUN 73 (H) 8 - 23 mg/dL   Creatinine, Ser 4.58 (H) 0.61 - 1.24 mg/dL   Calcium 7.6 (L) 8.9 - 10.3 mg/dL   Phosphorus 6.4 (H) 2.5 - 4.6 mg/dL   Albumin 1.8 (L) 3.5 - 5.0 g/dL   GFR, Estimated 10 (L) >60 mL/min    Comment: (NOTE) Calculated using the CKD-EPI Creatinine  Equation (2021)    Anion gap 12 5 - 15    Comment: Performed at [facility], [address], [place], [place] [address]  Glucose, capillary     Status: Abnormal   Collection Time:  07/19/24 11:07 PM  Result Value Ref Range   Glucose-Capillary 132 (H) 70 - 99 mg/dL    Comment: Glucose reference range applies only to samples taken after fasting for at least 8 hours.  Glucose, capillary     Status: Abnormal   Collection Time: 07/20/24  3:12 AM  Result Value Ref Range   Glucose-Capillary 134 (H) 70 - 99 mg/dL    Comment: Glucose reference range applies only to samples taken after fasting for at least 8 hours.  CBC     Status: Abnormal   Collection Time: 07/20/24  5:13 AM  Result Value Ref Range   WBC 12.4 (H) 4.0 - 10.5 K/uL   RBC 2.17 (L) 4.22 - 5.81 MIL/uL   Hemoglobin 7.0 (L) 13.0 - 17.0 g/dL   HCT 79.1 (L) 60.9 - 47.9 %   MCV 95.9 80.0 - 100.0 fL   MCH 32.3 26.0 - 34.0 pg   MCHC 33.7 30.0 - 36.0 g/dL   RDW 88.4 88.4 - 84.4 %   Platelets 123 (L) 150 - 400 K/uL   nRBC 0.2 0.0 - 0.2 %    Comment: Performed at [facility], [address] [place], [place] [address]  Heparin  level (unfractionated)     Status: Abnormal   Collection Time: 07/20/24  5:13 AM  Result Value Ref Range   Heparin  Unfractionated 0.26 (L) 0.30 - 0.70 IU/mL    Comment: (NOTE) The clinical reportable range upper limit is being lowered to >1.10 to align with the FDA approved guidance for the current laboratory assay.  If heparin  results are below expected values, and patient dosage has  been confirmed, suggest follow up testing of antithrombin III levels. Performed at [facility], [address], [place], [place] [address]   Basic metabolic panel     Status: Abnormal   Collection Time: 07/20/24  5:13 AM  Result Value Ref Range   Sodium 137 135 - 145 mmol/L   Potassium 4.7 3.5 - 5.1 mmol/L   Chloride 101 98 - 111 mmol/L   CO2 24 22 - 32 mmol/L   Glucose, Bld 131 (H) 70 - 99 mg/dL     Comment: Glucose reference range applies only to samples taken after fasting for at least 8 hours.   BUN 84 (H) 8 - 23 mg/dL   Creatinine, Ser 3.89 (H) 0.61 - 1.24 mg/dL   Calcium 7.9 (L) 8.9 - 10.3 mg/dL   GFR, Estimated 9 (L) >60 mL/min    Comment: (NOTE) Calculated using the CKD-EPI Creatinine Equation (2021)    Anion gap 12 5 - 15    Comment: Performed at [facility], [address], [place], [place] [address]  Glucose, capillary     Status: Abnormal   Collection Time: 07/20/24  8:29 AM  Result Value Ref Range   Glucose-Capillary 139 (H) 70 - 99 mg/dL    Comment: Glucose reference range applies only to samples taken after fasting for at least 8 hours.   ECHOCARDIOGRAM COMPLETE Result Date: 07/19/2024    ECHOCARDIOGRAM REPORT   Patient Name:   Lawrence BRUNTY Date of Exam: 07/19/2024 Medical Rec #:  [id number]     Height:       73.0 in Accession #:    [id number]    Weight:       181.4 lb Date of Birth:  Oct 05, 1945     BSA:          2.064 m Patient Age:  78 years      BP:           118/55 mmHg Patient Gender: M             HR:           101 bpm. Exam Location:  Inpatient Procedure: 2D Echo, Cardiac Doppler, Color Doppler and Intracardiac            Opacification Agent (Both Spectral and Color Flow Doppler were            utilized during procedure). Indications:    R94.31 Abnormal EKG  History:        Patient has prior history of Echocardiogram examinations, most                 recent 12/24/2020. Abnormal ECG, Arrythmias:Cardiac Arrest;                 Signs/Symptoms:Chest Pain. Paraplegia. Broken spine. PE and                 arrest during surgery.  Sonographer:    Ellouise Mose RDCS Referring Phys: [id number] [facility]  Sonographer Comments: Technically difficult study due to poor echo windows and echo performed with patient supine and on artificial respirator. Patient with cervical brace, supine. IMPRESSIONS  1. Basal Septal hypertrophy with maximal thickness 18 mm     No resting systolic  anterior motion of the mitral valve.     An intracavitary gradient is noted, but related to hyperdynamic function. Left ventricular ejection fraction, by estimation, is 70 to 75%. The left ventricle has hyperdynamic function. The left ventricle has no regional wall motion abnormalities. 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 Left ventricular diastolic parameters are consistent with Grade I diastolic dysfunction (impaired relaxation).  2. Right ventricular systolic function is low normal. The right ventricular size is normal. Tricuspid regurgitation signal is inadequate for assessing PA pressure.  3. The mitral valve was not well visualized. No evidence of mitral valve regurgitation.  4. The aortic valve was not well visualized. Aortic valve regurgitation is mild. No aortic stenosis is present.  5. The inferior vena cava is dilated in size with <50% respiratory variability, suggesting right atrial pressure of 15 mmHg. Comparison(s): Prior images reviewed side by side. Function is more dynamic than 2022 study. FINDINGS  Left Ventricle: Basal Septal hypertrophy with maximal thickness 18 mm No resting systolic anterior motion of the mitral valve. An intracavitary gradient is noted, but related to hyperdynamic function. Left ventricular ejection fraction, by estimation, is 70 to 75%. The left ventricle has hyperdynamic function. The left ventricle has no regional wall motion abnormalities. Definity contrast agent was given IV to delineate the left ventricular endocardial borders. The left ventricular internal cavity size was normal in size. 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 Left ventricular diastolic parameters are consistent with Grade I diastolic dysfunction (impaired relaxation). Right Ventricle: The right ventricular size is normal. Right vetricular wall thickness was not well visualized. Right ventricular systolic  function is low normal. Tricuspid regurgitation signal is inadequate for assessing PA pressure. Left Atrium: Left atrial size was normal in size. Right Atrium: Right atrial size was normal in size. Pericardium: Trivial pericardial effusion is present. The pericardial effusion is posterior to the left ventricle. Mitral Valve: The mitral valve was not well visualized. No evidence of mitral valve regurgitation. Tricuspid Valve: The tricuspid valve is normal in structure. Tricuspid valve regurgitation  is not demonstrated. No evidence of tricuspid stenosis. Aortic Valve: The aortic valve was not well visualized. Aortic valve regurgitation is mild. No aortic stenosis is present. Pulmonic Valve: The pulmonic valve was not well visualized. Pulmonic valve regurgitation is not visualized. No evidence of pulmonic stenosis. Aorta: The aortic root and ascending aorta are structurally normal, with no evidence of dilitation. Venous: The inferior vena cava is dilated in size with less than 50% respiratory variability, suggesting right atrial pressure of 15 mmHg. IAS/Shunts: The interatrial septum was not well visualized.  LEFT VENTRICLE PLAX 2D LVIDd:         3.80 cm     Diastology LVIDs:         2.50 cm     LV e' medial:    4.68 cm/s LV PW:         1.50 cm     LV E/e' medial:  12.9 LV IVS:        1.60 cm     LV e' lateral:   6.09 cm/s LVOT diam:     2.30 cm     LV E/e' lateral: 9.9 LV SV:         56 LV SV Index:   27 LVOT Area:     4.15 cm  LV Volumes (MOD) LV vol d, MOD A2C: 39.8 ml LV vol d, MOD A4C: 40.4 ml LV vol s, MOD A2C: 9.8 ml LV vol s, MOD A4C: 10.0 ml LV SV MOD A2C:     30.0 ml LV SV MOD A4C:     40.4 ml LV SV MOD BP:      27.3 ml RIGHT VENTRICLE             IVC RV S prime:     11.30 cm/s  IVC diam: 2.20 cm TAPSE (M-mode): 1.1 cm LEFT ATRIUM             Index       RIGHT ATRIUM          Index LA diam:        2.30 cm 1.11 cm/m  RA Area:     7.14 cm LA Vol (A2C):   10.4 ml 5.04 ml/m  RA Volume:   10.40 ml 5.04 ml/m  LA Vol (A4C):   12.9 ml 6.25 ml/m LA Biplane Vol: 11.7 ml 5.67 ml/m  AORTIC VALVE LVOT Vmax:   92.70 cm/s LVOT Vmean:  61.600 cm/s LVOT VTI:    0.135 m  AORTA Ao Root diam: 3.70 cm Ao Asc diam:  3.20 cm MITRAL VALVE MV Area (PHT): 3.85 cm     SHUNTS MV Decel Time: 197 msec     Systemic VTI:  0.14 m MV E velocity: 60.30 cm/s   Systemic Diam: 2.30 cm MV A velocity: 102.00 cm/s MV E/A ratio:  0.59 Stanly Leavens MD Electronically signed by Stanly Leavens MD Signature Date/Time: 07/19/2024/11:55:33 AM    Final    IR THROMBECT PRIM MECH ADD (INCLU) MOD SED Result Date: 07/18/2024 INDICATION: 78 year old with bilateral pulmonary embolism and had PEA cardiac arrest during cervical spine surgery. Evidence for right heart strain on CTA. Patient is intubated and requiring significant pressor support. EXAM: 1. Bilateral pulmonary arteriography 2. Mechanical thrombectomy of bilateral pulmonary embolism 3. Ultrasound guidance for vascular access 4. Pulmonary artery pressures COMPARISON:  CTA chest 07/17/2024 MEDICATIONS: Heparin  5000 units ANESTHESIA/SEDATION: The patient's level of consciousness and vital signs were monitored continuously by radiology and critical care nursing throughout the procedure.  Sedation was performed per critical care. FLUOROSCOPY: Radiation Exposure Index (as provided by the fluoroscopic device): 125.4 mGy Kerma CONTRAST:  90 mL Omnipaque  300 COMPLICATIONS: None immediate. TECHNIQUE: Informed written consent was obtained from the patient's family after a thorough discussion of the procedural risks, benefits and alternatives. All questions were addressed. Maximal Sterile Barrier Technique was utilized including caps, mask, sterile gowns, sterile gloves, sterile drape, hand hygiene and skin antiseptic. A timeout was performed prior to the initiation of the procedure. Patient was placed supine. Ultrasound demonstrated a patent right common femoral vein. Ultrasound image was saved for  documentation. Right groin was prepped and draped in sterile fashion. Skin was anesthetized with 1% lidocaine . Small incision was made. Using ultrasound guidance, a 21 gauge needle was directed into the right common femoral vein. Micropuncture dilator set was placed. 7 French vascular sheath was placed. Curved pigtail catheter was advanced into the IVC and into the main pulmonary artery using a tip deflecting wire with fluoroscopy. Pulmonary arterial pressure was obtained. Pigtail catheter was exchanged for a 5 French vert catheter and eventually a C2 catheter. It was technically difficult to get a catheter and wire into the distal main right pulmonary artery. Patient was having frequent PVCs with manipulation in the right heart and pulmonary arteries. Eventually the curved pigtail catheter was successfully advanced into the distal main pulmonary artery using the tip deflecting wire. Pulmonary arteriogram was performed at this location. The pigtail catheter and 8 French sheath were removed over a superstiff Amplatz wire with a short tip. Inari 24 French sheath was placed over the wire. [id number] aspiration catheter was advanced over the wire and into the right interlobar artery with fluoroscopy. Suction thrombectomy was performed. Large amount of clot was removed. Aspiration catheter was pulled back into the main pulmonary artery. Pulmonary arteriogram was performed in the main pulmonary artery. The vert catheter was advanced into the main left pulmonary artery using a buddy wire technique. The 5 French catheter and Bentson wire were advanced into a left lower lobe pulmonary artery. Bentson wire was exchanged for the short tip superstiff Amplatz wire. The 5 French catheter was removed. The curved Triever20 aspiration catheter was advanced over the wire into the left pulmonary artery. Suction thrombectomy was performed. Catheter was pulled back in the main pulmonary artery and pulmonary arteriography was performed.  Final pulmonary arterial pressure was obtained. Aspiration catheters were removed. Right groin sheath was removed using a pursestring suture. Right groin hemostasis at the end of the procedure. FINDINGS: Pre thrombectomy main pulmonary artery pressure = 28/16 mmHg, mean 20 Post thrombectomy main pulmonary artery pressure = 24/9 mmHg, mean 15 Pre thrombectomy angiography demonstrated a large amount of clot at the right pulmonary artery bifurcation. Large amount of clot was removed from the right pulmonary arteries. The large clot at the right pulmonary artery bifurcation was significantly debulked after the thrombectomy. Flow in the right inter lobar artery and truncus anterior after thrombectomy. Pre thrombectomy images demonstrated occlusive thrombus in the left lower lobe pulmonary artery. Large amount of clot was removed from the left pulmonary arteries. After thrombectomy, the occlusive thrombus in the left lower lobe was removed and there was flow into the left lower lobe. IMPRESSION: 1. Successful mechanical thrombectomy of bilateral pulmonary emboli. Electronically Signed   By: Juliene Balder M.D.   On: 07/18/2024 17:31   IR THROMBECT PRIM MECH ADD (INCLU) MOD SED Result Date: 07/18/2024 INDICATION: 78 year old with bilateral pulmonary embolism and had PEA cardiac arrest during cervical spine  surgery. Evidence for right heart strain on CTA. Patient is intubated and requiring significant pressor support. EXAM: 1. Bilateral pulmonary arteriography 2. Mechanical thrombectomy of bilateral pulmonary embolism 3. Ultrasound guidance for vascular access 4. Pulmonary artery pressures COMPARISON:  CTA chest 07/17/2024 MEDICATIONS: Heparin  5000 units ANESTHESIA/SEDATION: The patient's level of consciousness and vital signs were monitored continuously by radiology and critical care nursing throughout the procedure. Sedation was performed per critical care. FLUOROSCOPY: Radiation Exposure Index (as provided by the  fluoroscopic device): 125.4 mGy Kerma CONTRAST:  90 mL Omnipaque  300 COMPLICATIONS: None immediate. TECHNIQUE: Informed written consent was obtained from the patient's family after a thorough discussion of the procedural risks, benefits and alternatives. All questions were addressed. Maximal Sterile Barrier Technique was utilized including caps, mask, sterile gowns, sterile gloves, sterile drape, hand hygiene and skin antiseptic. A timeout was performed prior to the initiation of the procedure. Patient was placed supine. Ultrasound demonstrated a patent right common femoral vein. Ultrasound image was saved for documentation. Right groin was prepped and draped in sterile fashion. Skin was anesthetized with 1% lidocaine . Small incision was made. Using ultrasound guidance, a 21 gauge needle was directed into the right common femoral vein. Micropuncture dilator set was placed. 7 French vascular sheath was placed. Curved pigtail catheter was advanced into the IVC and into the main pulmonary artery using a tip deflecting wire with fluoroscopy. Pulmonary arterial pressure was obtained. Pigtail catheter was exchanged for a 5 French vert catheter and eventually a C2 catheter. It was technically difficult to get a catheter and wire into the distal main right pulmonary artery. Patient was having frequent PVCs with manipulation in the right heart and pulmonary arteries. Eventually the curved pigtail catheter was successfully advanced into the distal main pulmonary artery using the tip deflecting wire. Pulmonary arteriogram was performed at this location. The pigtail catheter and 8 French sheath were removed over a superstiff Amplatz wire with a short tip. Inari 24 French sheath was placed over the wire. [id number] aspiration catheter was advanced over the wire and into the right interlobar artery with fluoroscopy. Suction thrombectomy was performed. Large amount of clot was removed. Aspiration catheter was pulled back into the  main pulmonary artery. Pulmonary arteriogram was performed in the main pulmonary artery. The vert catheter was advanced into the main left pulmonary artery using a buddy wire technique. The 5 French catheter and Bentson wire were advanced into a left lower lobe pulmonary artery. Bentson wire was exchanged for the short tip superstiff Amplatz wire. The 5 French catheter was removed. The curved Triever20 aspiration catheter was advanced over the wire into the left pulmonary artery. Suction thrombectomy was performed. Catheter was pulled back in the main pulmonary artery and pulmonary arteriography was performed. Final pulmonary arterial pressure was obtained. Aspiration catheters were removed. Right groin sheath was removed using a pursestring suture. Right groin hemostasis at the end of the procedure. FINDINGS: Pre thrombectomy main pulmonary artery pressure = 28/16 mmHg, mean 20 Post thrombectomy main pulmonary artery pressure = 24/9 mmHg, mean 15 Pre thrombectomy angiography demonstrated a large amount of clot at the right pulmonary artery bifurcation. Large amount of clot was removed from the right pulmonary arteries. The large clot at the right pulmonary artery bifurcation was significantly debulked after the thrombectomy. Flow in the right inter lobar artery and truncus anterior after thrombectomy. Pre thrombectomy images demonstrated occlusive thrombus in the left lower lobe pulmonary artery. Large amount of clot was removed from the left pulmonary arteries. After thrombectomy, the  occlusive thrombus in the left lower lobe was removed and there was flow into the left lower lobe. IMPRESSION: 1. Successful mechanical thrombectomy of bilateral pulmonary emboli. Electronically Signed   By: Juliene Balder M.D.   On: 07/18/2024 17:31   IR THROMBECT PRIM MECH ADD (INCLU) MOD SED Result Date: 07/18/2024 INDICATION: 78 year old with bilateral pulmonary embolism and had PEA cardiac arrest during cervical spine surgery.  Evidence for right heart strain on CTA. Patient is intubated and requiring significant pressor support. EXAM: 1. Bilateral pulmonary arteriography 2. Mechanical thrombectomy of bilateral pulmonary embolism 3. Ultrasound guidance for vascular access 4. Pulmonary artery pressures COMPARISON:  CTA chest 07/17/2024 MEDICATIONS: Heparin  5000 units ANESTHESIA/SEDATION: The patient's level of consciousness and vital signs were monitored continuously by radiology and critical care nursing throughout the procedure. Sedation was performed per critical care. FLUOROSCOPY: Radiation Exposure Index (as provided by the fluoroscopic device): 125.4 mGy Kerma CONTRAST:  90 mL Omnipaque  300 COMPLICATIONS: None immediate. TECHNIQUE: Informed written consent was obtained from the patient's family after a thorough discussion of the procedural risks, benefits and alternatives. All questions were addressed. Maximal Sterile Barrier Technique was utilized including caps, mask, sterile gowns, sterile gloves, sterile drape, hand hygiene and skin antiseptic. A timeout was performed prior to the initiation of the procedure. Patient was placed supine. Ultrasound demonstrated a patent right common femoral vein. Ultrasound image was saved for documentation. Right groin was prepped and draped in sterile fashion. Skin was anesthetized with 1% lidocaine . Small incision was made. Using ultrasound guidance, a 21 gauge needle was directed into the right common femoral vein. Micropuncture dilator set was placed. 7 French vascular sheath was placed. Curved pigtail catheter was advanced into the IVC and into the main pulmonary artery using a tip deflecting wire with fluoroscopy. Pulmonary arterial pressure was obtained. Pigtail catheter was exchanged for a 5 French vert catheter and eventually a C2 catheter. It was technically difficult to get a catheter and wire into the distal main right pulmonary artery. Patient was having frequent PVCs with manipulation  in the right heart and pulmonary arteries. Eventually the curved pigtail catheter was successfully advanced into the distal main pulmonary artery using the tip deflecting wire. Pulmonary arteriogram was performed at this location. The pigtail catheter and 8 French sheath were removed over a superstiff Amplatz wire with a short tip. Inari 24 French sheath was placed over the wire. [id number] aspiration catheter was advanced over the wire and into the right interlobar artery with fluoroscopy. Suction thrombectomy was performed. Large amount of clot was removed. Aspiration catheter was pulled back into the main pulmonary artery. Pulmonary arteriogram was performed in the main pulmonary artery. The vert catheter was advanced into the main left pulmonary artery using a buddy wire technique. The 5 French catheter and Bentson wire were advanced into a left lower lobe pulmonary artery. Bentson wire was exchanged for the short tip superstiff Amplatz wire. The 5 French catheter was removed. The curved Triever20 aspiration catheter was advanced over the wire into the left pulmonary artery. Suction thrombectomy was performed. Catheter was pulled back in the main pulmonary artery and pulmonary arteriography was performed. Final pulmonary arterial pressure was obtained. Aspiration catheters were removed. Right groin sheath was removed using a pursestring suture. Right groin hemostasis at the end of the procedure. FINDINGS: Pre thrombectomy main pulmonary artery pressure = 28/16 mmHg, mean 20 Post thrombectomy main pulmonary artery pressure = 24/9 mmHg, mean 15 Pre thrombectomy angiography demonstrated a large amount of clot at the right  pulmonary artery bifurcation. Large amount of clot was removed from the right pulmonary arteries. The large clot at the right pulmonary artery bifurcation was significantly debulked after the thrombectomy. Flow in the right inter lobar artery and truncus anterior after thrombectomy. Pre  thrombectomy images demonstrated occlusive thrombus in the left lower lobe pulmonary artery. Large amount of clot was removed from the left pulmonary arteries. After thrombectomy, the occlusive thrombus in the left lower lobe was removed and there was flow into the left lower lobe. IMPRESSION: 1. Successful mechanical thrombectomy of bilateral pulmonary emboli. Electronically Signed   By: Juliene Balder M.D.   On: 07/18/2024 17:31   IR Angiogram Pulmonary Bilateral Selective Result Date: 07/18/2024 INDICATION: 78 year old with bilateral pulmonary embolism and had PEA cardiac arrest during cervical spine surgery. Evidence for right heart strain on CTA. Patient is intubated and requiring significant pressor support. EXAM: 1. Bilateral pulmonary arteriography 2. Mechanical thrombectomy of bilateral pulmonary embolism 3. Ultrasound guidance for vascular access 4. Pulmonary artery pressures COMPARISON:  CTA chest 07/17/2024 MEDICATIONS: Heparin  5000 units ANESTHESIA/SEDATION: The patient's level of consciousness and vital signs were monitored continuously by radiology and critical care nursing throughout the procedure. Sedation was performed per critical care. FLUOROSCOPY: Radiation Exposure Index (as provided by the fluoroscopic device): 125.4 mGy Kerma CONTRAST:  90 mL Omnipaque  300 COMPLICATIONS: None immediate. TECHNIQUE: Informed written consent was obtained from the patient's family after a thorough discussion of the procedural risks, benefits and alternatives. All questions were addressed. Maximal Sterile Barrier Technique was utilized including caps, mask, sterile gowns, sterile gloves, sterile drape, hand hygiene and skin antiseptic. A timeout was performed prior to the initiation of the procedure. Patient was placed supine. Ultrasound demonstrated a patent right common femoral vein. Ultrasound image was saved for documentation. Right groin was prepped and draped in sterile fashion. Skin was anesthetized with 1%  lidocaine . Small incision was made. Using ultrasound guidance, a 21 gauge needle was directed into the right common femoral vein. Micropuncture dilator set was placed. 7 French vascular sheath was placed. Curved pigtail catheter was advanced into the IVC and into the main pulmonary artery using a tip deflecting wire with fluoroscopy. Pulmonary arterial pressure was obtained. Pigtail catheter was exchanged for a 5 French vert catheter and eventually a C2 catheter. It was technically difficult to get a catheter and wire into the distal main right pulmonary artery. Patient was having frequent PVCs with manipulation in the right heart and pulmonary arteries. Eventually the curved pigtail catheter was successfully advanced into the distal main pulmonary artery using the tip deflecting wire. Pulmonary arteriogram was performed at this location. The pigtail catheter and 8 French sheath were removed over a superstiff Amplatz wire with a short tip. Inari 24 French sheath was placed over the wire. Umpzczm75 aspiration catheter was advanced over the wire and into the right interlobar artery with fluoroscopy. Suction thrombectomy was performed. Large amount of clot was removed. Aspiration catheter was pulled back into the main pulmonary artery. Pulmonary arteriogram was performed in the main pulmonary artery. The vert catheter was advanced into the main left pulmonary artery using a buddy wire technique. The 5 French catheter and Bentson wire were advanced into a left lower lobe pulmonary artery. Bentson wire was exchanged for the short tip superstiff Amplatz wire. The 5 French catheter was removed. The curved Triever20 aspiration catheter was advanced over the wire into the left pulmonary artery. Suction thrombectomy was performed. Catheter was pulled back in the main pulmonary artery and pulmonary arteriography  was performed. Final pulmonary arterial pressure was obtained. Aspiration catheters were removed. Right groin sheath  was removed using a pursestring suture. Right groin hemostasis at the end of the procedure. FINDINGS: Pre thrombectomy main pulmonary artery pressure = 28/16 mmHg, mean 20 Post thrombectomy main pulmonary artery pressure = 24/9 mmHg, mean 15 Pre thrombectomy angiography demonstrated a large amount of clot at the right pulmonary artery bifurcation. Large amount of clot was removed from the right pulmonary arteries. The large clot at the right pulmonary artery bifurcation was significantly debulked after the thrombectomy. Flow in the right inter lobar artery and truncus anterior after thrombectomy. Pre thrombectomy images demonstrated occlusive thrombus in the left lower lobe pulmonary artery. Large amount of clot was removed from the left pulmonary arteries. After thrombectomy, the occlusive thrombus in the left lower lobe was removed and there was flow into the left lower lobe. IMPRESSION: 1. Successful mechanical thrombectomy of bilateral pulmonary emboli. Electronically Signed   By: Juliene Balder M.D.   On: 07/18/2024 17:31   IR Angiogram Selective Each Additional Vessel Result Date: 07/18/2024 INDICATION: 78 year old with bilateral pulmonary embolism and had PEA cardiac arrest during cervical spine surgery. Evidence for right heart strain on CTA. Patient is intubated and requiring significant pressor support. EXAM: 1. Bilateral pulmonary arteriography 2. Mechanical thrombectomy of bilateral pulmonary embolism 3. Ultrasound guidance for vascular access 4. Pulmonary artery pressures COMPARISON:  CTA chest 07/17/2024 MEDICATIONS: Heparin  5000 units ANESTHESIA/SEDATION: The patient's level of consciousness and vital signs were monitored continuously by radiology and critical care nursing throughout the procedure. Sedation was performed per critical care. FLUOROSCOPY: Radiation Exposure Index (as provided by the fluoroscopic device): 125.4 mGy Kerma CONTRAST:  90 mL Omnipaque  300 COMPLICATIONS: None immediate.  TECHNIQUE: Informed written consent was obtained from the patient's family after a thorough discussion of the procedural risks, benefits and alternatives. All questions were addressed. Maximal Sterile Barrier Technique was utilized including caps, mask, sterile gowns, sterile gloves, sterile drape, hand hygiene and skin antiseptic. A timeout was performed prior to the initiation of the procedure. Patient was placed supine. Ultrasound demonstrated a patent right common femoral vein. Ultrasound image was saved for documentation. Right groin was prepped and draped in sterile fashion. Skin was anesthetized with 1% lidocaine . Small incision was made. Using ultrasound guidance, a 21 gauge needle was directed into the right common femoral vein. Micropuncture dilator set was placed. 7 French vascular sheath was placed. Curved pigtail catheter was advanced into the IVC and into the main pulmonary artery using a tip deflecting wire with fluoroscopy. Pulmonary arterial pressure was obtained. Pigtail catheter was exchanged for a 5 French vert catheter and eventually a C2 catheter. It was technically difficult to get a catheter and wire into the distal main right pulmonary artery. Patient was having frequent PVCs with manipulation in the right heart and pulmonary arteries. Eventually the curved pigtail catheter was successfully advanced into the distal main pulmonary artery using the tip deflecting wire. Pulmonary arteriogram was performed at this location. The pigtail catheter and 8 French sheath were removed over a superstiff Amplatz wire with a short tip. Inari 24 French sheath was placed over the wire. Umpzczm75 aspiration catheter was advanced over the wire and into the right interlobar artery with fluoroscopy. Suction thrombectomy was performed. Large amount of clot was removed. Aspiration catheter was pulled back into the main pulmonary artery. Pulmonary arteriogram was performed in the main pulmonary artery. The vert  catheter was advanced into the main left pulmonary artery using a  buddy wire technique. The 5 French catheter and Bentson wire were advanced into a left lower lobe pulmonary artery. Bentson wire was exchanged for the short tip superstiff Amplatz wire. The 5 French catheter was removed. The curved Triever20 aspiration catheter was advanced over the wire into the left pulmonary artery. Suction thrombectomy was performed. Catheter was pulled back in the main pulmonary artery and pulmonary arteriography was performed. Final pulmonary arterial pressure was obtained. Aspiration catheters were removed. Right groin sheath was removed using a pursestring suture. Right groin hemostasis at the end of the procedure. FINDINGS: Pre thrombectomy main pulmonary artery pressure = 28/16 mmHg, mean 20 Post thrombectomy main pulmonary artery pressure = 24/9 mmHg, mean 15 Pre thrombectomy angiography demonstrated a large amount of clot at the right pulmonary artery bifurcation. Large amount of clot was removed from the right pulmonary arteries. The large clot at the right pulmonary artery bifurcation was significantly debulked after the thrombectomy. Flow in the right inter lobar artery and truncus anterior after thrombectomy. Pre thrombectomy images demonstrated occlusive thrombus in the left lower lobe pulmonary artery. Large amount of clot was removed from the left pulmonary arteries. After thrombectomy, the occlusive thrombus in the left lower lobe was removed and there was flow into the left lower lobe. IMPRESSION: 1. Successful mechanical thrombectomy of bilateral pulmonary emboli. Electronically Signed   By: Juliene Balder M.D.   On: 07/18/2024 17:31    PMH:   Past Medical History:  Diagnosis Date   Arthritis    Diabetes mellitus without complication (HCC)    GERD (gastroesophageal reflux disease)    Hypertension    Sleep apnea     PSH:   Past Surgical History:  Procedure Laterality Date   ANTERIOR CERVICAL  DECOMP/DISCECTOMY FUSION N/A 07/17/2024   Procedure: ANTERIOR CERVICAL DECOMPRESSION/DISCECTOMY FUSION, CERVICAL SEVEN - THORACIC ONE;  Surgeon: Debby Dorn MATSU, MD;  Location: [facility] OR;  Service: Neurosurgery;  Laterality: N/A;  Partial Discecomy Cervical seven- Thoracic one   APPENDECTOMY     78 yo   BACK SURGERY  1975   lumbar diskectomy   done [facility]   COLONOSCOPY N/A 02/21/2015   Procedure: COLONOSCOPY;  Surgeon: Claudis RAYMOND Rivet, MD;  Location: [facility];  Service: Endoscopy;  Laterality: N/A;  1025   COLONOSCOPY  11/13/2021   COLONOSCOPY WITH PROPOFOL  N/A 11/13/2021   Procedure: COLONOSCOPY WITH PROPOFOL ;  Surgeon: Rivet Claudis RAYMOND, MD;  Location: [facility];  Service: Endoscopy;  Laterality: N/A;  155   CYSTOSCOPY WITH URETHRAL DILATATION N/A 12/09/2022   Procedure: CYSTOSCOPY WITH URETHRAL DILATATION;  Surgeon: Watt Rush, MD;  Location: [facility] OR[facility];  Service: Urology;  Laterality: N/A;   EYE SURGERY Bilateral    Lasik surgery   IR ANGIOGRAM PULMONARY BILATERAL SELECTIVE  07/18/2024   IR ANGIOGRAM SELECTIVE EACH ADDITIONAL VESSEL  07/18/2024   IR THROMBECT PRIM MECH ADD (INCLU) MOD SED  07/18/2024   IR THROMBECT PRIM MECH ADD (INCLU) MOD SED  07/18/2024   IR THROMBECT PRIM MECH ADD (INCLU) MOD SED  07/18/2024   LEFT HEART CATH AND CORONARY ANGIOGRAPHY N/A 12/23/2020   Procedure: LEFT HEART CATH AND CORONARY ANGIOGRAPHY;  Surgeon: Burnard Debby LABOR, MD;  Location: [facility] IN[facility];  Service: Cardiovascular;  Laterality: N/A;   NECK SURGERY  1999   Work injury, posterior fusion   POLYPECTOMY  11/13/2021   Procedure: POLYPECTOMY INTESTINAL;  Surgeon: Rivet Claudis RAYMOND, MD;  Location: [facility];  Service: Endoscopy;;   SHOULDER SURGERY  Bilateral 2000   rotoator cuff repair   done at [facility]   TONSILLECTOMY     TRANSURETHRAL INCISION OF PROSTATE N/A 12/09/2022   Procedure: TRANSURETHRAL RESECTION OF PROSTATE;  Surgeon: Watt Rush, MD;  Location: [facility] OR[facility];  Service:  Urology;  Laterality: N/A;  1 HR FOR CASE    Allergies:  Allergies  Allergen Reactions   Penicillins Hives   Lucentis [Ranibizumab] Rash    Medications:   Prior to Admission medications   Medication Sig Start Date End Date Taking? Authorizing Provider  Apple Cider Vinegar 600 MG CAPS Take 600 mg by mouth daily.   Yes [provider]  aspirin  EC 81 MG tablet Take 1 tablet (81 mg total) by mouth every other day. 11/14/21  Yes Rehman, Claudis PENNER, MD  Barberry-Oreg Grape-Goldenseal (BERBERINE COMPLEX PO) Take 1 capsule by mouth daily.   Yes [provider]  cholecalciferol (VITAMIN D3) 25 MCG (1000 UNIT) tablet Take 1,000 Units by mouth daily.   Yes [provider]  Cinnamon 500 MG capsule Take 500 mg by mouth daily.   Yes [provider]  diphenhydrAMINE HCl (NERVINE PO) Take 1 tablet by mouth daily. Patient states that he takes one a day   Yes [provider]  empagliflozin (JARDIANCE) 10 MG TABS tablet Take 10 mg by mouth daily.   Yes [provider]  ferrous sulfate 325 (65 FE) MG tablet Take 325 mg by mouth daily with breakfast.   Yes [provider]  hydrochlorothiazide  (HYDRODIURIL ) 25 MG tablet Take 25 mg by mouth daily. 07/23/21  Yes [provider]  insulin  degludec (TRESIBA  FLEXTOUCH) 100 UNIT/ML FlexTouch Pen Inject 12 Units into the skin at bedtime. Patient taking differently: Inject 28 Units into the skin 2 (two) times daily. 10/07/22  Yes Reardon, Benton PARAS, NP  Multiple Vitamins-Minerals (OCUVITE ADULT 50+ PO) Take 1 tablet by mouth daily.   Yes [provider]  OVER THE COUNTER MEDICATION Take 1 capsule by mouth daily. Prostagenix   Yes [provider]  OVER THE COUNTER MEDICATION Take 1 capsule by mouth daily. Diabacore (Diabetic Supplement)   Yes [provider]  OVER THE COUNTER MEDICATION Take 1 tablet by mouth in the morning and at bedtime. Glucoceil   Yes [provider]  tadalafil  (CIALIS ) 5 MG tablet Take 1 tablet (5 mg total) by mouth daily as needed for erectile dysfunction. 12/06/20  Yes Watt Rush, MD  vitamin E 180 MG (400 UNITS) capsule Take 400 Units by mouth daily.   Yes [provider]  Vitamin Mixture (ESTER-C PO) Take 1,000 mg by mouth daily.   Yes [provider]    Discontinued Meds:   Medications Discontinued During This Encounter  Medication Reason   0.9 %  sodium chloride  infusion    lactated ringers  infusion Patient Transfer   insulin  aspart (novoLOG ) injection 0-7 Units Patient Transfer   lidocaine -EPINEPHrine (XYLOCAINE  W/EPI) 1 %-1:100000 (with pres) injection Patient Transfer   0.9 % irrigation (POUR BTL) Patient Transfer   Surgifoam 1 Gm with Thrombin 5,000 units (5 ml) topical solution Patient Transfer   HYDROmorphone  (DILAUDID ) injection 0.25-0.5 mg Patient Transfer   droperidol (INAPSINE) 2.5 MG/ML injection 0.625 mg Patient Transfer   insulin  glargine-yfgn (SEMGLEE) injection 28 Units    midazolam  (VERSED ) 2 MG/2ML injection Returned to ADS   Thrombi-Pad 3X3 pad 1 each    insulin  aspart (novoLOG ) injection 0-6 Units    insulin  glargine-yfgn (SEMGLEE) injection 20 Units  phenylephrine (NEO-SYNEPHRINE) 20mg /NS 250mL premix infusion    heparin  ADULT infusion 100 units/mL (25000 units/250mL)    fentaNYL  (SUBLIMAZE ) injection 25-50 mcg    midazolam  PF (VERSED ) injection 4 mg    norepinephrine (LEVOPHED) 4mg  in 250mL (0.016 mg/mL) premix infusion    heparin  ADULT infusion 100 units/mL (25000 units/250mL)    vasopressin (PITRESSIN) 20 Units in 100 mL (0.2 unit/mL) infusion-*FOR SHOCK*    dexmedetomidine (PRECEDEX) 400 MCG/100ML (4 mcg/mL) infusion    midazolam  (VERSED ) 100 mg/100 mL (1 mg/mL) premix infusion    calcium chloride injection 1 g    Oral care mouth rinse    lactated ringers  infusion    fentaNYL  in NS 250mL (32mcg/ml) infusion-PREMIX    fentaNYL  (SUBLIMAZE ) bolus via infusion 25-100  mcg     Social History:  reports that he has never smoked. He has never used smokeless tobacco. He reports that he does not drink alcohol and does not use drugs.  Family History:  History reviewed. No pertinent family history.  Blood pressure (!) 117/50, pulse 91, temperature 98.7 F (37.1 C), temperature source Axillary, resp. rate 20, height 6' 1 (1.854 m), weight 82.3 kg, SpO2 98%. Physical Exam: General appearance: alert, cooperative, and appears stated age Head: NCAT Neck: no adenopathy, brace Resp: CTA b/l Cardio: regular rate and rhythm GI: SNDNT Extremities: 1+ edema Pulses: 2+ and symmetric     Norissa Bartee, LYNWOOD ORN, MD 07/20/2024, 10:39 AM

## 2024-07-20 NOTE — Progress Notes (Signed)
 Initial Nutrition Assessment  DOCUMENTATION CODES:   Not applicable  INTERVENTION:   Tube Feeding via Cortrak:  Vital 1.5 at 55 ml/hr Begin TF at rate of 20 ml/hr, titrate by 10 mL q 8 hours until goal rate of 55 ml/hr Pro-source TF20 60 mL TID TF at goal provides 149 g of protein, 2220 kcals and 1003 mL of free water   Currently only on sliding scale insulin , HgbA1c 12.3. With initiation of TF, pt will likely require additional insulin  coverage  Add Renal MVI  NUTRITION DIAGNOSIS:   Inadequate oral intake related to acute illness as evidenced by NPO status.  GOAL:   Patient will meet greater than or equal to 90% of their needs  MONITOR:   Vent status, TF tolerance, Labs, Weight trends, Skin  REASON FOR ASSESSMENT:   Consult Assessment of nutrition requirement/status (CRRT)  ASSESSMENT:   78 yo admitted post fall with onset of numbness in BLE. Neurosurgery consulted, pt taken to OR for partial anterior cervical discectomy but procedure aborted secondary to multiple PEA arrests, intubated. Pt with acute biventricular HF, anuric AKI requiring initiation of CRRT PMH includes DM, HTN, BPH s/p TURP  08-14-24 Admitted, coded multiple times in OR, acute biventricular HF, massive PE, shock 10/27 Mechanical thrombectomy for bilateral PE 10/28 Extubated 10/29 Cortrak, CRRT initiation  Pt alert, C-Collar in place. Neurosurgery still following, plan to continue to assess candidacy for completion of 7-T1 ACDF. Insensate below trunk, no movement in legs.  Levophed at 2, currently on heparin  gtt  CRRT starting today, currently anuric  NPO, Cortrak placed today, plan to begin TF  Hgb 6.6, receiving PRBC  Wife at bedside; wife reports pt eating well prior to admission but reports wt loss. Pt reports UBW around 180 pounds; current wt 82.3 kg (181 pounds), admission wt 79.5 kg.   CBGs 125-147 (140-180); currently only on sliding scale insulin  Lab Results  Component Value Date    HGBA1C 12.3 (H) Aug 14, 2024    Labs: BUN 73 Creatinine 5.41 Phosphorus 6.4 (H) Corrected calcium 9.4 (serum calcium 7.6, albumin 1.8) Albumin 1.8 (L)  Meds: SS Novolog  Senna-docusate Miralax  NUTRITION - FOCUSED PHYSICAL EXAM:  Flowsheet Row Most Recent Value  Orbital Region Mild depletion  Upper Arm Region Unable to assess  Thoracic and Lumbar Region Unable to assess  Buccal Region Mild depletion  Temple Region Mild depletion  Clavicle Bone Region Mild depletion  Clavicle and Acromion Bone Region Mild depletion  Scapular Bone Region Mild depletion  Dorsal Hand Unable to assess  Patellar Region Unable to assess  Anterior Thigh Region Unable to assess  Posterior Calf Region Unable to assess  Edema (RD Assessment) Moderate  Hair Reviewed  Eyes Unable to assess  Mouth Unable to assess  Skin Reviewed  Nails Reviewed    Diet Order:   Diet Order             Diet NPO time specified Except for: Ice Chips, Sips with Meds  Diet effective now                   EDUCATION NEEDS:   Not appropriate for education at this time  Skin:  Skin Assessment: Reviewed RN Assessment  Last BM:  PTA  Height:   Ht Readings from Last 1 Encounters:  2024-08-14 6' 1 (1.854 m)    Weight:   Wt Readings from Last 1 Encounters:  07/19/24 82.3 kg    BMI:  Body mass index is 23.94 kg/m.  Estimated Nutritional  Needs:   Kcal:  2100-2300 kcals  Protein:  145-165 g  Fluid:  1.5 L   Betsey Finger MS, RDN, LDN, CNSC Registered Dietitian 3 Clinical Nutrition RD Inpatient Contact Info in Amion

## 2024-07-20 NOTE — Progress Notes (Signed)
 NAME:  Lawrence [name], MRN:  [id number], DOB:  1945-10-21, LOS: 3 ADMISSION DATE:  07/16/2024, CONSULTATION DATE: 07/17/2024 REFERRING MD:  Debby Carrier , CHIEF COMPLAINT: Status postcardiac arrest  History of Present Illness:  78 year old male with diabetes, hypertension, BPH status post TURP who presented after having a ground-level fall with recent onset of numbness in his lower extremity- complete loss of sensation to BLE & urinary incontinence after fall.      Neurosurgery was consulted, patient mental OR for partial anterior cervical discectomy C7-T1 but course was complicated with multiple time PEA cardiac arrest in OR, procedure was aborted, TEE was done which showed severe RV dysfunction and dilatation with moderate to severe LV dysfunction, patient was transferred to ICU for close monitoring.  In ICU patient blood pressure started dropping and he went into PEA cardiac arrest again, ROSC was achieved after 2 minutes.  Unfortunately due to surgery, it was recommended no TNK/TPA.  Due to tenuous status and concern for re-arrest it was decided to hold on suction thrombectomy at the time.   Pertinent  Medical History   Past Medical History:  Diagnosis Date   Arthritis    Diabetes mellitus without complication (HCC)    GERD (gastroesophageal reflux disease)    Hypertension    Sleep apnea      Significant Hospital Events: Including procedures, antibiotic start and stop dates in addition to other pertinent events   Admitted and PCCM consulted, coded multiple times in OR (PEA) 07/18/2024 mechanical thrombectomy was done for bilateral pulmonary embolism. 10/28 nephro consult 10/29 on 5 NE, plan for HD cath placement and CRRT   Interim History / Subjective:   HD cath placement unsuccessful yesterday   Hgb 7 this morning   Objective    Blood pressure (!) 133/44, pulse 98, temperature 98.7 F (37.1 C), temperature source Axillary, resp. rate 17, height 6' 1 (1.854 m), weight  82.3 kg, SpO2 97%. CVP:  [1 mmHg-11 mmHg] 1 mmHg  FiO2 (%):  [36 %] 36 %   Intake/Output Summary (Last 24 hours) at 07/20/2024 1205 Last data filed at 07/20/2024 0800 Gross per 24 hour  Intake 1219.18 ml  Output 37 ml  Net 1182.18 ml   Filed Weights   07/16/24 2239 07/18/24 0430 07/19/24 0430  Weight: 79.4 kg 79.5 kg 82.3 kg    Examination:   General: critically ill M NAD  HEENT: C collar in place. Anterior neck dressing with some blood Neuro: Awake, BUE 3/5 (antigravity).Endorses sensation BLE  Chest: Even unlabored, coarse sounds  Heart:  rr s1s2  Abdomen: soft ndnt  GU: foley   Resolved problem list  Lactic acidosis  Endotracheally intubated  Assessment and Plan   PEA arrest Massive PE Acute BiV HF, improving  Shock -Status post mechanical thrombectomy on 07/18/2024 Plan:  -cont NE -cont hep gtt  -send coox, repeat H/h  -IR has removed the inari closure device   Acute hypoxic and hypercarbic resp failure Traumatic rib fx  Bilat PNA  P -pulm hygiene -[place] for goal > 90  -de-escalate abx to rocephin   Fall Traumatic C7-T1 SCI P -NSGY following.  -cont c collar  -follow neuro exam closely   AKI P -plan for HD cath placement and CRRT  -nephro following   Elevated LFTs -PRN LFTs    Anemia -repeat H/H, transfuse PRN-- d/w pt and family  -T&S is active   Labs   CBC: Recent Labs  Lab 07/16/24 1959 07/17/24 0425 07/17/24 2140 07/17/24 2145 07/18/24  9582 07/18/24 0425 07/18/24 1616 07/18/24 1707 07/18/24 1815 07/18/24 1949 07/19/24 0113 07/19/24 0155 07/20/24 0513  WBC 12.0*   < > 22.4*  --  16.1*  --  16.4*  --   --   --   --  14.0* 12.4*  NEUTROABS 10.3*  --   --   --   --   --   --   --   --   --   --   --   --   HGB 14.7   < > 13.4   < > 12.9*   < > 10.5*   < > 8.8* 8.5* 7.1* 9.0* 7.0*  HCT 40.6   < > 38.5*   < > 36.8*   < > 29.6*   < > 26.0* 25.0* 21.0* 26.3* 20.8*  MCV 89.6   < > 92.5  --  90.9  --  89.4  --   --   --   --   92.6 95.9  PLT 253   < > 211  --  199  --  125*  --   --   --   --  122* 123*   < > = values in this interval not displayed.    Basic Metabolic Panel: Recent Labs  Lab 07/17/24 0425 07/17/24 1352 07/17/24 2140 07/17/24 2145 07/18/24 0417 07/18/24 0425 07/18/24 1616 07/18/24 1707 07/19/24 0113 07/19/24 0155 07/19/24 1453 07/19/24 2100 07/20/24 0513  NA 135   < > 138   < > 134*   < > 138   < > 140 135 135 137 137  K 3.6   < > 4.1   < > 3.7   < > 3.2*   < > 3.3* 3.8 4.4 4.6 4.7  CL 98   < > 98  --  100  --  103  --   --  100 101 101 101  CO2 24   < > 19*  --  19*  --  19*  --   --  22 24 24 24   GLUCOSE 180*   < > 393*  --  372*  --  144*  --   --  137* 131* 123* 131*  BUN 22   < > 35*  --  41*  --  50*  --   --  56* 69* 73* 84*  CREATININE 0.99   < > 2.37*  --  2.90*  --  3.69*  --   --  4.18* 5.16* 5.41* 6.10*  CALCIUM 9.0   < > 9.3  --  8.6*  --  8.3*  --   --  8.0* 7.7* 7.6* 7.9*  MG 2.2  --  2.0  --  1.8  --  1.9  --   --   --   --   --   --   PHOS 3.4  --   --   --   --   --   --   --   --   --   --  6.4*  --    < > = values in this interval not displayed.   GFR: Estimated Creatinine Clearance: 11.6 mL/min (A) (by C-G formula based on SCr of 6.1 mg/dL (H)). Recent Labs  Lab 07/17/24 1756 07/17/24 2140 07/18/24 0417 07/18/24 1616 07/19/24 0155 07/20/24 0513  WBC  --    < > 16.1* 16.4* 14.0* 12.4*  LATICACIDVEN 8.6*  --  4.6* 3.2* 1.4  --    < > =  values in this interval not displayed.    Liver Function Tests: Recent Labs  Lab 07/16/24 1959 07/18/24 0417 07/19/24 0155 07/19/24 2100  AST 46* 1,476* 622*  --   ALT 20 978* 673*  --   ALKPHOS 103 58 50  --   BILITOT 0.7 1.3* 2.3*  --   PROT 7.3 4.3* 3.9*  --   ALBUMIN 4.3 2.1* 1.9* 1.8*   Recent Labs  Lab 07/18/24 0417  LIPASE 29   No results for input(s): AMMONIA in the last 168 hours.  ABG    Component Value Date/Time   PHART 7.461 (H) 07/19/2024 0113   PCO2ART 29.2 (L) 07/19/2024 0113    PO2ART 158 (H) 07/19/2024 0113   HCO3 20.8 07/19/2024 0113   TCO2 22 07/19/2024 0113   ACIDBASEDEF 3.0 (H) 07/19/2024 0113   O2SAT 100 07/19/2024 0113     Coagulation Profile: Recent Labs  Lab 07/17/24 0425 07/17/24 1451 07/17/24 2140  INR 1.0 1.7* 1.7*    Cardiac Enzymes: Recent Labs  Lab 07/16/24 1959 07/17/24 0425  CKTOTAL 1,321* 938*    HbA1C: Hemoglobin A1C  Date/Time Value Ref Range Status  07/23/2021 12:00 AM 11.9  Final  10/27/2020 12:00 AM 9.3  Final   HbA1c, POC (controlled diabetic range)  Date/Time Value Ref Range Status  11/19/2021 11:44 AM 10.2 (A) 0.0 - 7.0 % Final   HbA1c POC (<> result, manual entry)  Date/Time Value Ref Range Status  03/18/2022 03:21 PM 10.8 4.0 - 5.6 % Final   Hgb A1c MFr Bld  Date/Time Value Ref Range Status  07/17/2024 04:25 AM 12.3 (H) 4.8 - 5.6 % Final    Comment:    (NOTE) Diagnosis of Diabetes The following HbA1c ranges recommended by the American Diabetes Association (ADA) may be used as an aid in the diagnosis of diabetes mellitus.  Hemoglobin             Suggested A1C NGSP%              Diagnosis  <5.7                   Non Diabetic  5.7-6.4                Pre-Diabetic  >6.4                   Diabetic  <7.0                   Glycemic control for                       adults with diabetes.    12/02/2022 11:30 AM 11.9 (H) 4.8 - 5.6 % Final    Comment:    (NOTE)         Prediabetes: 5.7 - 6.4         Diabetes: >6.4         Glycemic control for adults with diabetes: <7.0     CBG: Recent Labs  Lab 07/19/24 1512 07/19/24 1939 07/19/24 2307 07/20/24 0312 07/20/24 0829  GLUCAP 125* 136* 132* 134* 139*    CRITICAL CARE Performed by: Ronnald FORBES Gave  Total critical care time: 38 minutes  Critical care time was exclusive of separately billable procedures and treating other patients. Critical care was necessary to treat or prevent imminent or life-threatening deterioration.  Critical care was time  spent personally by me on the following activities: development of  treatment plan with patient and/or surrogate as well as nursing, discussions with consultants, evaluation of patient's response to treatment, examination of patient, obtaining history from patient or surrogate, ordering and performing treatments and interventions, ordering and review of laboratory studies, ordering and review of radiographic studies, pulse oximetry and re-evaluation of patient's condition.   Ronnald Gave MSN, AGACNP-BC [facility] Amion for pager 07/20/2024, 12:05 PM

## 2024-07-20 NOTE — Progress Notes (Signed)
 Patient ID: Steffen Hase, male   DOB: April 06, 1946, 78 y.o.   MRN: [id number]    Referring Physician(s): ICU   Supervising Physician: Vanice Revel  Patient Status:  [facility] - In-pt  Chief Complaint:  Bilateral hilar PE; s/p PE thrombectomy 10/27 with Dr. Philip  Subjective:  Pt remains intubated. Is awake and nods appropriately to yes/no questions.  To remove Inari closure device from thrombectomy today.  Allergies: Penicillins and Lucentis [ranibizumab]  Medications: Prior to Admission medications   Medication Sig Start Date End Date Taking? Authorizing Provider  Apple Cider Vinegar 600 MG CAPS Take 600 mg by mouth daily.   Yes [provider]  aspirin  EC 81 MG tablet Take 1 tablet (81 mg total) by mouth every other day. 11/14/21  Yes Rehman, Claudis PENNER, MD  Barberry-Oreg Grape-Goldenseal (BERBERINE COMPLEX PO) Take 1 capsule by mouth daily.   Yes [provider]  cholecalciferol (VITAMIN D3) 25 MCG (1000 UNIT) tablet Take 1,000 Units by mouth daily.   Yes [provider]  Cinnamon 500 MG capsule Take 500 mg by mouth daily.   Yes [provider]  diphenhydrAMINE HCl (NERVINE PO) Take 1 tablet by mouth daily. Patient states that he takes one a day   Yes [provider]  empagliflozin (JARDIANCE) 10 MG TABS tablet Take 10 mg by mouth daily.   Yes [provider]  ferrous sulfate 325 (65 FE) MG tablet Take 325 mg by mouth daily with breakfast.   Yes [provider]  hydrochlorothiazide  (HYDRODIURIL ) 25 MG tablet Take 25 mg by mouth daily. 07/23/21  Yes [provider]  insulin  degludec (TRESIBA  FLEXTOUCH) 100 UNIT/ML FlexTouch Pen Inject 12 Units into the skin at bedtime. Patient taking differently: Inject 28 Units into the skin 2 (two) times daily. 10/07/22  Yes Reardon, Benton PARAS, NP  Multiple Vitamins-Minerals (OCUVITE ADULT 50+ PO) Take 1 tablet by mouth daily.   Yes [provider]  OVER THE COUNTER  MEDICATION Take 1 capsule by mouth daily. Prostagenix   Yes [provider]  OVER THE COUNTER MEDICATION Take 1 capsule by mouth daily. Diabacore (Diabetic Supplement)   Yes [provider]  OVER THE COUNTER MEDICATION Take 1 tablet by mouth in the morning and at bedtime. Glucoceil   Yes [provider]  tadalafil  (CIALIS ) 5 MG tablet Take 1 tablet (5 mg total) by mouth daily as needed for erectile dysfunction. 12/06/20  Yes Watt Rush, MD  vitamin E 180 MG (400 UNITS) capsule Take 400 Units by mouth daily.   Yes [provider]  Vitamin Mixture (ESTER-C PO) Take 1,000 mg by mouth daily.   Yes [provider]     Vital Signs: BP (!) 133/44   Pulse 98   Temp 98.7 F (37.1 C) (Axillary)   Resp 17   Ht 6' 1 (1.854 m)   Wt 181 lb 7 oz (82.3 kg) Comment: with artic sun pads  SpO2 97%   BMI 23.94 kg/m   Physical Exam Vitals and nursing note reviewed.  Constitutional:      Comments: Awake, nodding head appropriately  Cardiovascular:     Rate and Rhythm: Normal rate.  Pulmonary:     Comments: Intubated. Mildly Diminished BS bil Abdominal:     Palpations: Abdomen is soft.  Skin:    General: Skin is warm and dry.  Neurological:     Comments: Awake and alert, answering questions appropriately with yes/no head nods. Exam further limited as pt is intubated  Imaging: ECHOCARDIOGRAM COMPLETE Result Date: 07/19/2024    ECHOCARDIOGRAM REPORT   Patient Name:   SIRCHARLES HOLZHEIMER Date of Exam: 07/19/2024 Medical Rec #:  [id number]     Height:       73.0 in Accession #:    [id number]    Weight:       181.4 lb Date of Birth:  1946/03/27     BSA:          2.064 m Patient Age:    78 years      BP:           118/55 mmHg Patient Gender: M             HR:           101 bpm. Exam Location:  Inpatient Procedure: 2D Echo, Cardiac Doppler, Color Doppler and Intracardiac            Opacification Agent (Both Spectral and Color Flow Doppler were            utilized  during procedure). Indications:    R94.31 Abnormal EKG  History:        Patient has prior history of Echocardiogram examinations, most                 recent 12/24/2020. Abnormal ECG, Arrythmias:Cardiac Arrest;                 Signs/Symptoms:Chest Pain. Paraplegia. Broken spine. PE and                 arrest during surgery.  Sonographer:    Ellouise Mose RDCS Referring Phys: [id number] [facility]  Sonographer Comments: Technically difficult study due to poor echo windows and echo performed with patient supine and on artificial respirator. Patient with cervical brace, supine. IMPRESSIONS  1. Basal Septal hypertrophy with maximal thickness 18 mm     No resting systolic anterior motion of the mitral valve.     An intracavitary gradient is noted, but related to hyperdynamic function. Left ventricular ejection fraction, by estimation, is 70 to 75%. The left ventricle has hyperdynamic function. The left ventricle has no regional wall motion abnormalities. 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 Left ventricular diastolic parameters are consistent with Grade I diastolic dysfunction (impaired relaxation).  2. Right ventricular systolic function is low normal. The right ventricular size is normal. Tricuspid regurgitation signal is inadequate for assessing PA pressure.  3. The mitral valve was not well visualized. No evidence of mitral valve regurgitation.  4. The aortic valve was not well visualized. Aortic valve regurgitation is mild. No aortic stenosis is present.  5. The inferior vena cava is dilated in size with <50% respiratory variability, suggesting right atrial pressure of 15 mmHg. Comparison(s): Prior images reviewed side by side. Function is more dynamic than 2022 study. FINDINGS  Left Ventricle: Basal Septal hypertrophy with maximal thickness 18 mm No resting systolic anterior motion of the mitral valve. An intracavitary gradient is noted, but related to hyperdynamic  function. Left ventricular ejection fraction, by estimation, is 70 to 75%. The left ventricle has hyperdynamic function. The left ventricle has no regional wall motion abnormalities. Definity contrast agent was given IV to delineate the left ventricular endocardial borders. The left ventricular internal cavity size was normal in size. 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 Left ventricular diastolic parameters are consistent with Grade I diastolic dysfunction (impaired relaxation). Right Ventricle: The right ventricular size is  normal. Right vetricular wall thickness was not well visualized. Right ventricular systolic function is low normal. Tricuspid regurgitation signal is inadequate for assessing PA pressure. Left Atrium: Left atrial size was normal in size. Right Atrium: Right atrial size was normal in size. Pericardium: Trivial pericardial effusion is present. The pericardial effusion is posterior to the left ventricle. Mitral Valve: The mitral valve was not well visualized. No evidence of mitral valve regurgitation. Tricuspid Valve: The tricuspid valve is normal in structure. Tricuspid valve regurgitation is not demonstrated. No evidence of tricuspid stenosis. Aortic Valve: The aortic valve was not well visualized. Aortic valve regurgitation is mild. No aortic stenosis is present. Pulmonic Valve: The pulmonic valve was not well visualized. Pulmonic valve regurgitation is not visualized. No evidence of pulmonic stenosis. Aorta: The aortic root and ascending aorta are structurally normal, with no evidence of dilitation. Venous: The inferior vena cava is dilated in size with less than 50% respiratory variability, suggesting right atrial pressure of 15 mmHg. IAS/Shunts: The interatrial septum was not well visualized.  LEFT VENTRICLE PLAX 2D LVIDd:         3.80 cm     Diastology LVIDs:         2.50 cm     LV e' medial:    4.68 cm/s LV PW:         1.50 cm     LV E/e'  medial:  12.9 LV IVS:        1.60 cm     LV e' lateral:   6.09 cm/s LVOT diam:     2.30 cm     LV E/e' lateral: 9.9 LV SV:         56 LV SV Index:   27 LVOT Area:     4.15 cm  LV Volumes (MOD) LV vol d, MOD A2C: 39.8 ml LV vol d, MOD A4C: 40.4 ml LV vol s, MOD A2C: 9.8 ml LV vol s, MOD A4C: 10.0 ml LV SV MOD A2C:     30.0 ml LV SV MOD A4C:     40.4 ml LV SV MOD BP:      27.3 ml RIGHT VENTRICLE             IVC RV S prime:     11.30 cm/s  IVC diam: 2.20 cm TAPSE (M-mode): 1.1 cm LEFT ATRIUM             Index       RIGHT ATRIUM          Index LA diam:        2.30 cm 1.11 cm/m  RA Area:     7.14 cm LA Vol (A2C):   10.4 ml 5.04 ml/m  RA Volume:   10.40 ml 5.04 ml/m LA Vol (A4C):   12.9 ml 6.25 ml/m LA Biplane Vol: 11.7 ml 5.67 ml/m  AORTIC VALVE LVOT Vmax:   92.70 cm/s LVOT Vmean:  61.600 cm/s LVOT VTI:    0.135 m  AORTA Ao Root diam: 3.70 cm Ao Asc diam:  3.20 cm MITRAL VALVE MV Area (PHT): 3.85 cm     SHUNTS MV Decel Time: 197 msec     Systemic VTI:  0.14 m MV E velocity: 60.30 cm/s   Systemic Diam: 2.30 cm MV A velocity: 102.00 cm/s MV E/A ratio:  0.59 Stanly Leavens MD Electronically signed by Stanly Leavens MD Signature Date/Time: 07/19/2024/11:55:33 AM    Final    IR THROMBECT PRIM MECH ADD (INCLU) MOD SED  Result Date: 07/18/2024 INDICATION: 78 year old with bilateral pulmonary embolism and had PEA cardiac arrest during cervical spine surgery. Evidence for right heart strain on CTA. Patient is intubated and requiring significant pressor support. EXAM: 1. Bilateral pulmonary arteriography 2. Mechanical thrombectomy of bilateral pulmonary embolism 3. Ultrasound guidance for vascular access 4. Pulmonary artery pressures COMPARISON:  CTA chest 07/17/2024 MEDICATIONS: Heparin  5000 units ANESTHESIA/SEDATION: The patient's level of consciousness and vital signs were monitored continuously by radiology and critical care nursing throughout the procedure. Sedation was performed per critical care.  FLUOROSCOPY: Radiation Exposure Index (as provided by the fluoroscopic device): 125.4 mGy Kerma CONTRAST:  90 mL Omnipaque  300 COMPLICATIONS: None immediate. TECHNIQUE: Informed written consent was obtained from the patient's family after a thorough discussion of the procedural risks, benefits and alternatives. All questions were addressed. Maximal Sterile Barrier Technique was utilized including caps, mask, sterile gowns, sterile gloves, sterile drape, hand hygiene and skin antiseptic. A timeout was performed prior to the initiation of the procedure. Patient was placed supine. Ultrasound demonstrated a patent right common femoral vein. Ultrasound image was saved for documentation. Right groin was prepped and draped in sterile fashion. Skin was anesthetized with 1% lidocaine . Small incision was made. Using ultrasound guidance, a 21 gauge needle was directed into the right common femoral vein. Micropuncture dilator set was placed. 7 French vascular sheath was placed. Curved pigtail catheter was advanced into the IVC and into the main pulmonary artery using a tip deflecting wire with fluoroscopy. Pulmonary arterial pressure was obtained. Pigtail catheter was exchanged for a 5 French vert catheter and eventually a C2 catheter. It was technically difficult to get a catheter and wire into the distal main right pulmonary artery. Patient was having frequent PVCs with manipulation in the right heart and pulmonary arteries. Eventually the curved pigtail catheter was successfully advanced into the distal main pulmonary artery using the tip deflecting wire. Pulmonary arteriogram was performed at this location. The pigtail catheter and 8 French sheath were removed over a superstiff Amplatz wire with a short tip. Inari 24 French sheath was placed over the wire. [id number] aspiration catheter was advanced over the wire and into the right interlobar artery with fluoroscopy. Suction thrombectomy was performed. Large amount of clot  was removed. Aspiration catheter was pulled back into the main pulmonary artery. Pulmonary arteriogram was performed in the main pulmonary artery. The vert catheter was advanced into the main left pulmonary artery using a buddy wire technique. The 5 French catheter and Bentson wire were advanced into a left lower lobe pulmonary artery. Bentson wire was exchanged for the short tip superstiff Amplatz wire. The 5 French catheter was removed. The curved Triever20 aspiration catheter was advanced over the wire into the left pulmonary artery. Suction thrombectomy was performed. Catheter was pulled back in the main pulmonary artery and pulmonary arteriography was performed. Final pulmonary arterial pressure was obtained. Aspiration catheters were removed. Right groin sheath was removed using a pursestring suture. Right groin hemostasis at the end of the procedure. FINDINGS: Pre thrombectomy main pulmonary artery pressure = 28/16 mmHg, mean 20 Post thrombectomy main pulmonary artery pressure = 24/9 mmHg, mean 15 Pre thrombectomy angiography demonstrated a large amount of clot at the right pulmonary artery bifurcation. Large amount of clot was removed from the right pulmonary arteries. The large clot at the right pulmonary artery bifurcation was significantly debulked after the thrombectomy. Flow in the right inter lobar artery and truncus anterior after thrombectomy. Pre thrombectomy images demonstrated occlusive thrombus in the left lower  lobe pulmonary artery. Large amount of clot was removed from the left pulmonary arteries. After thrombectomy, the occlusive thrombus in the left lower lobe was removed and there was flow into the left lower lobe. IMPRESSION: 1. Successful mechanical thrombectomy of bilateral pulmonary emboli. Electronically Signed   By: Juliene Balder M.D.   On: 07/18/2024 17:31   IR THROMBECT PRIM MECH ADD (INCLU) MOD SED Result Date: 07/18/2024 INDICATION: 78 year old with bilateral pulmonary embolism  and had PEA cardiac arrest during cervical spine surgery. Evidence for right heart strain on CTA. Patient is intubated and requiring significant pressor support. EXAM: 1. Bilateral pulmonary arteriography 2. Mechanical thrombectomy of bilateral pulmonary embolism 3. Ultrasound guidance for vascular access 4. Pulmonary artery pressures COMPARISON:  CTA chest 07/17/2024 MEDICATIONS: Heparin  5000 units ANESTHESIA/SEDATION: The patient's level of consciousness and vital signs were monitored continuously by radiology and critical care nursing throughout the procedure. Sedation was performed per critical care. FLUOROSCOPY: Radiation Exposure Index (as provided by the fluoroscopic device): 125.4 mGy Kerma CONTRAST:  90 mL Omnipaque  300 COMPLICATIONS: None immediate. TECHNIQUE: Informed written consent was obtained from the patient's family after a thorough discussion of the procedural risks, benefits and alternatives. All questions were addressed. Maximal Sterile Barrier Technique was utilized including caps, mask, sterile gowns, sterile gloves, sterile drape, hand hygiene and skin antiseptic. A timeout was performed prior to the initiation of the procedure. Patient was placed supine. Ultrasound demonstrated a patent right common femoral vein. Ultrasound image was saved for documentation. Right groin was prepped and draped in sterile fashion. Skin was anesthetized with 1% lidocaine . Small incision was made. Using ultrasound guidance, a 21 gauge needle was directed into the right common femoral vein. Micropuncture dilator set was placed. 7 French vascular sheath was placed. Curved pigtail catheter was advanced into the IVC and into the main pulmonary artery using a tip deflecting wire with fluoroscopy. Pulmonary arterial pressure was obtained. Pigtail catheter was exchanged for a 5 French vert catheter and eventually a C2 catheter. It was technically difficult to get a catheter and wire into the distal main right pulmonary  artery. Patient was having frequent PVCs with manipulation in the right heart and pulmonary arteries. Eventually the curved pigtail catheter was successfully advanced into the distal main pulmonary artery using the tip deflecting wire. Pulmonary arteriogram was performed at this location. The pigtail catheter and 8 French sheath were removed over a superstiff Amplatz wire with a short tip. Inari 24 French sheath was placed over the wire. [id number] aspiration catheter was advanced over the wire and into the right interlobar artery with fluoroscopy. Suction thrombectomy was performed. Large amount of clot was removed. Aspiration catheter was pulled back into the main pulmonary artery. Pulmonary arteriogram was performed in the main pulmonary artery. The vert catheter was advanced into the main left pulmonary artery using a buddy wire technique. The 5 French catheter and Bentson wire were advanced into a left lower lobe pulmonary artery. Bentson wire was exchanged for the short tip superstiff Amplatz wire. The 5 French catheter was removed. The curved Triever20 aspiration catheter was advanced over the wire into the left pulmonary artery. Suction thrombectomy was performed. Catheter was pulled back in the main pulmonary artery and pulmonary arteriography was performed. Final pulmonary arterial pressure was obtained. Aspiration catheters were removed. Right groin sheath was removed using a pursestring suture. Right groin hemostasis at the end of the procedure. FINDINGS: Pre thrombectomy main pulmonary artery pressure = 28/16 mmHg, mean 20 Post thrombectomy main pulmonary artery pressure =  24/9 mmHg, mean 15 Pre thrombectomy angiography demonstrated a large amount of clot at the right pulmonary artery bifurcation. Large amount of clot was removed from the right pulmonary arteries. The large clot at the right pulmonary artery bifurcation was significantly debulked after the thrombectomy. Flow in the right inter lobar  artery and truncus anterior after thrombectomy. Pre thrombectomy images demonstrated occlusive thrombus in the left lower lobe pulmonary artery. Large amount of clot was removed from the left pulmonary arteries. After thrombectomy, the occlusive thrombus in the left lower lobe was removed and there was flow into the left lower lobe. IMPRESSION: 1. Successful mechanical thrombectomy of bilateral pulmonary emboli. Electronically Signed   By: Juliene Balder M.D.   On: 07/18/2024 17:31   IR THROMBECT PRIM MECH ADD (INCLU) MOD SED Result Date: 07/18/2024 INDICATION: 78 year old with bilateral pulmonary embolism and had PEA cardiac arrest during cervical spine surgery. Evidence for right heart strain on CTA. Patient is intubated and requiring significant pressor support. EXAM: 1. Bilateral pulmonary arteriography 2. Mechanical thrombectomy of bilateral pulmonary embolism 3. Ultrasound guidance for vascular access 4. Pulmonary artery pressures COMPARISON:  CTA chest 07/17/2024 MEDICATIONS: Heparin  5000 units ANESTHESIA/SEDATION: The patient's level of consciousness and vital signs were monitored continuously by radiology and critical care nursing throughout the procedure. Sedation was performed per critical care. FLUOROSCOPY: Radiation Exposure Index (as provided by the fluoroscopic device): 125.4 mGy Kerma CONTRAST:  90 mL Omnipaque  300 COMPLICATIONS: None immediate. TECHNIQUE: Informed written consent was obtained from the patient's family after a thorough discussion of the procedural risks, benefits and alternatives. All questions were addressed. Maximal Sterile Barrier Technique was utilized including caps, mask, sterile gowns, sterile gloves, sterile drape, hand hygiene and skin antiseptic. A timeout was performed prior to the initiation of the procedure. Patient was placed supine. Ultrasound demonstrated a patent right common femoral vein. Ultrasound image was saved for documentation. Right groin was prepped and  draped in sterile fashion. Skin was anesthetized with 1% lidocaine . Small incision was made. Using ultrasound guidance, a 21 gauge needle was directed into the right common femoral vein. Micropuncture dilator set was placed. 7 French vascular sheath was placed. Curved pigtail catheter was advanced into the IVC and into the main pulmonary artery using a tip deflecting wire with fluoroscopy. Pulmonary arterial pressure was obtained. Pigtail catheter was exchanged for a 5 French vert catheter and eventually a C2 catheter. It was technically difficult to get a catheter and wire into the distal main right pulmonary artery. Patient was having frequent PVCs with manipulation in the right heart and pulmonary arteries. Eventually the curved pigtail catheter was successfully advanced into the distal main pulmonary artery using the tip deflecting wire. Pulmonary arteriogram was performed at this location. The pigtail catheter and 8 French sheath were removed over a superstiff Amplatz wire with a short tip. Inari 24 French sheath was placed over the wire. [id number] aspiration catheter was advanced over the wire and into the right interlobar artery with fluoroscopy. Suction thrombectomy was performed. Large amount of clot was removed. Aspiration catheter was pulled back into the main pulmonary artery. Pulmonary arteriogram was performed in the main pulmonary artery. The vert catheter was advanced into the main left pulmonary artery using a buddy wire technique. The 5 French catheter and Bentson wire were advanced into a left lower lobe pulmonary artery. Bentson wire was exchanged for the short tip superstiff Amplatz wire. The 5 French catheter was removed. The curved Triever20 aspiration catheter was advanced over the wire into the  left pulmonary artery. Suction thrombectomy was performed. Catheter was pulled back in the main pulmonary artery and pulmonary arteriography was performed. Final pulmonary arterial pressure was  obtained. Aspiration catheters were removed. Right groin sheath was removed using a pursestring suture. Right groin hemostasis at the end of the procedure. FINDINGS: Pre thrombectomy main pulmonary artery pressure = 28/16 mmHg, mean 20 Post thrombectomy main pulmonary artery pressure = 24/9 mmHg, mean 15 Pre thrombectomy angiography demonstrated a large amount of clot at the right pulmonary artery bifurcation. Large amount of clot was removed from the right pulmonary arteries. The large clot at the right pulmonary artery bifurcation was significantly debulked after the thrombectomy. Flow in the right inter lobar artery and truncus anterior after thrombectomy. Pre thrombectomy images demonstrated occlusive thrombus in the left lower lobe pulmonary artery. Large amount of clot was removed from the left pulmonary arteries. After thrombectomy, the occlusive thrombus in the left lower lobe was removed and there was flow into the left lower lobe. IMPRESSION: 1. Successful mechanical thrombectomy of bilateral pulmonary emboli. Electronically Signed   By: Juliene Balder M.D.   On: 07/18/2024 17:31   IR Angiogram Pulmonary Bilateral Selective Result Date: 07/18/2024 INDICATION: 78 year old with bilateral pulmonary embolism and had PEA cardiac arrest during cervical spine surgery. Evidence for right heart strain on CTA. Patient is intubated and requiring significant pressor support. EXAM: 1. Bilateral pulmonary arteriography 2. Mechanical thrombectomy of bilateral pulmonary embolism 3. Ultrasound guidance for vascular access 4. Pulmonary artery pressures COMPARISON:  CTA chest 07/17/2024 MEDICATIONS: Heparin  5000 units ANESTHESIA/SEDATION: The patient's level of consciousness and vital signs were monitored continuously by radiology and critical care nursing throughout the procedure. Sedation was performed per critical care. FLUOROSCOPY: Radiation Exposure Index (as provided by the fluoroscopic device): 125.4 mGy Kerma  CONTRAST:  90 mL Omnipaque  300 COMPLICATIONS: None immediate. TECHNIQUE: Informed written consent was obtained from the patient's family after a thorough discussion of the procedural risks, benefits and alternatives. All questions were addressed. Maximal Sterile Barrier Technique was utilized including caps, mask, sterile gowns, sterile gloves, sterile drape, hand hygiene and skin antiseptic. A timeout was performed prior to the initiation of the procedure. Patient was placed supine. Ultrasound demonstrated a patent right common femoral vein. Ultrasound image was saved for documentation. Right groin was prepped and draped in sterile fashion. Skin was anesthetized with 1% lidocaine . Small incision was made. Using ultrasound guidance, a 21 gauge needle was directed into the right common femoral vein. Micropuncture dilator set was placed. 7 French vascular sheath was placed. Curved pigtail catheter was advanced into the IVC and into the main pulmonary artery using a tip deflecting wire with fluoroscopy. Pulmonary arterial pressure was obtained. Pigtail catheter was exchanged for a 5 French vert catheter and eventually a C2 catheter. It was technically difficult to get a catheter and wire into the distal main right pulmonary artery. Patient was having frequent PVCs with manipulation in the right heart and pulmonary arteries. Eventually the curved pigtail catheter was successfully advanced into the distal main pulmonary artery using the tip deflecting wire. Pulmonary arteriogram was performed at this location. The pigtail catheter and 8 French sheath were removed over a superstiff Amplatz wire with a short tip. Inari 24 French sheath was placed over the wire. Umpzczm75 aspiration catheter was advanced over the wire and into the right interlobar artery with fluoroscopy. Suction thrombectomy was performed. Large amount of clot was removed. Aspiration catheter was pulled back into the main pulmonary artery. Pulmonary  arteriogram was performed  in the main pulmonary artery. The vert catheter was advanced into the main left pulmonary artery using a buddy wire technique. The 5 French catheter and Bentson wire were advanced into a left lower lobe pulmonary artery. Bentson wire was exchanged for the short tip superstiff Amplatz wire. The 5 French catheter was removed. The curved Triever20 aspiration catheter was advanced over the wire into the left pulmonary artery. Suction thrombectomy was performed. Catheter was pulled back in the main pulmonary artery and pulmonary arteriography was performed. Final pulmonary arterial pressure was obtained. Aspiration catheters were removed. Right groin sheath was removed using a pursestring suture. Right groin hemostasis at the end of the procedure. FINDINGS: Pre thrombectomy main pulmonary artery pressure = 28/16 mmHg, mean 20 Post thrombectomy main pulmonary artery pressure = 24/9 mmHg, mean 15 Pre thrombectomy angiography demonstrated a large amount of clot at the right pulmonary artery bifurcation. Large amount of clot was removed from the right pulmonary arteries. The large clot at the right pulmonary artery bifurcation was significantly debulked after the thrombectomy. Flow in the right inter lobar artery and truncus anterior after thrombectomy. Pre thrombectomy images demonstrated occlusive thrombus in the left lower lobe pulmonary artery. Large amount of clot was removed from the left pulmonary arteries. After thrombectomy, the occlusive thrombus in the left lower lobe was removed and there was flow into the left lower lobe. IMPRESSION: 1. Successful mechanical thrombectomy of bilateral pulmonary emboli. Electronically Signed   By: Juliene Balder M.D.   On: 07/18/2024 17:31   IR Angiogram Selective Each Additional Vessel Result Date: 07/18/2024 INDICATION: 78 year old with bilateral pulmonary embolism and had PEA cardiac arrest during cervical spine surgery. Evidence for right heart strain  on CTA. Patient is intubated and requiring significant pressor support. EXAM: 1. Bilateral pulmonary arteriography 2. Mechanical thrombectomy of bilateral pulmonary embolism 3. Ultrasound guidance for vascular access 4. Pulmonary artery pressures COMPARISON:  CTA chest 07/17/2024 MEDICATIONS: Heparin  5000 units ANESTHESIA/SEDATION: The patient's level of consciousness and vital signs were monitored continuously by radiology and critical care nursing throughout the procedure. Sedation was performed per critical care. FLUOROSCOPY: Radiation Exposure Index (as provided by the fluoroscopic device): 125.4 mGy Kerma CONTRAST:  90 mL Omnipaque  300 COMPLICATIONS: None immediate. TECHNIQUE: Informed written consent was obtained from the patient's family after a thorough discussion of the procedural risks, benefits and alternatives. All questions were addressed. Maximal Sterile Barrier Technique was utilized including caps, mask, sterile gowns, sterile gloves, sterile drape, hand hygiene and skin antiseptic. A timeout was performed prior to the initiation of the procedure. Patient was placed supine. Ultrasound demonstrated a patent right common femoral vein. Ultrasound image was saved for documentation. Right groin was prepped and draped in sterile fashion. Skin was anesthetized with 1% lidocaine . Small incision was made. Using ultrasound guidance, a 21 gauge needle was directed into the right common femoral vein. Micropuncture dilator set was placed. 7 French vascular sheath was placed. Curved pigtail catheter was advanced into the IVC and into the main pulmonary artery using a tip deflecting wire with fluoroscopy. Pulmonary arterial pressure was obtained. Pigtail catheter was exchanged for a 5 French vert catheter and eventually a C2 catheter. It was technically difficult to get a catheter and wire into the distal main right pulmonary artery. Patient was having frequent PVCs with manipulation in the right heart and  pulmonary arteries. Eventually the curved pigtail catheter was successfully advanced into the distal main pulmonary artery using the tip deflecting wire. Pulmonary arteriogram was performed at this location. The  pigtail catheter and 8 French sheath were removed over a superstiff Amplatz wire with a short tip. Inari 24 French sheath was placed over the wire. Umpzczm75 aspiration catheter was advanced over the wire and into the right interlobar artery with fluoroscopy. Suction thrombectomy was performed. Large amount of clot was removed. Aspiration catheter was pulled back into the main pulmonary artery. Pulmonary arteriogram was performed in the main pulmonary artery. The vert catheter was advanced into the main left pulmonary artery using a buddy wire technique. The 5 French catheter and Bentson wire were advanced into a left lower lobe pulmonary artery. Bentson wire was exchanged for the short tip superstiff Amplatz wire. The 5 French catheter was removed. The curved Triever20 aspiration catheter was advanced over the wire into the left pulmonary artery. Suction thrombectomy was performed. Catheter was pulled back in the main pulmonary artery and pulmonary arteriography was performed. Final pulmonary arterial pressure was obtained. Aspiration catheters were removed. Right groin sheath was removed using a pursestring suture. Right groin hemostasis at the end of the procedure. FINDINGS: Pre thrombectomy main pulmonary artery pressure = 28/16 mmHg, mean 20 Post thrombectomy main pulmonary artery pressure = 24/9 mmHg, mean 15 Pre thrombectomy angiography demonstrated a large amount of clot at the right pulmonary artery bifurcation. Large amount of clot was removed from the right pulmonary arteries. The large clot at the right pulmonary artery bifurcation was significantly debulked after the thrombectomy. Flow in the right inter lobar artery and truncus anterior after thrombectomy. Pre thrombectomy images demonstrated  occlusive thrombus in the left lower lobe pulmonary artery. Large amount of clot was removed from the left pulmonary arteries. After thrombectomy, the occlusive thrombus in the left lower lobe was removed and there was flow into the left lower lobe. IMPRESSION: 1. Successful mechanical thrombectomy of bilateral pulmonary emboli. Electronically Signed   By: Juliene Balder M.D.   On: 07/18/2024 17:31   CT Angio Chest Pulmonary Embolism (PE) W or WO Contrast Addendum Date: 07/17/2024 ADDENDUM REPORT: 07/17/2024 18:30 ADDENDUM: Critical Value/emergent results were called by telephone at the time of interpretation on 07/17/2024 at 6:30 pm to provider Rolan Sharps, who verbally acknowledged these results. Electronically Signed   By: Leita Birmingham M.D.   On: 07/17/2024 18:30   Result Date: 07/17/2024 CLINICAL DATA:  Pulmonary embolism suspected, high probability. PE seen on echo, follow-up. EXAM: CT ANGIOGRAPHY CHEST WITH CONTRAST TECHNIQUE: Multidetector CT imaging of the chest was performed using the standard protocol during bolus administration of intravenous contrast. Multiplanar CT image reconstructions and MIPs were obtained to evaluate the vascular anatomy. RADIATION DOSE REDUCTION: This exam was performed according to the departmental dose-optimization program which includes automated exposure control, adjustment of the mA and/or kV according to patient size and/or use of iterative reconstruction technique. CONTRAST:  75mL OMNIPAQUE  IOHEXOL  350 MG/ML SOLN COMPARISON:  None Available. FINDINGS: Cardiovascular: The heart is normal in size and there is a trace pericardial effusion. A few scattered coronary artery calcifications are present. There is atherosclerotic calcification of the aorta without evidence of aneurysm. The pulmonary trunk is distended suggesting underlying pulmonary artery hypertension. Pulmonary artery filling defects are noted in the left and right pulmonary arteries extending into the lobar,  segmental and subsegmental arteries bilaterally. There is extensive thrombus burden. The right ventricle is distended suggesting right heart strain. A right internal jugular central venous catheter terminates in the superior vena cava. Mediastinum/Nodes: No mediastinal, hilar, or axillary lymphadenopathy is seen. The thyroid gland is within normal limits. An endotracheal  tube and enteric tube are identified. Lungs/Pleura: There are small bilateral pleural effusions, greater on the right than the left, with associated atelectasis. Scattered infiltrates are present in the left upper lobe, suspicious for pneumonia. No pneumothorax is seen. Upper Abdomen: Hyperdense material is present in the gallbladder, possible sludge or stones. Mild ascites is noted in the upper abdomen. An enteric tube terminates in the stomach. Mild fat stranding is present about the pancreas. No pancreatic ductal dilatation. Musculoskeletal: Degenerative changes are present in the thoracic spine. Postsurgical changes are noted in the cervical region with air in the subcutaneous soft tissues. There is a fracture of the T6 rib on the right anteriorly. There are fractures of the T5 and T6 ribs on the left anteriorly. Old healed rib fractures are present on the left. Review of the MIP images confirms the above findings. IMPRESSION: 1. Extensive pulmonary artery thrombus bilaterally with evidence of right heart strain. 2. Scattered infiltrates in the left upper lobe, suspicious for pneumonia. 3. Small bilateral pleural effusions. 4. Acute fractures of the T6 rib on the right in T5 and T6 ribs on the left. 5. Fat stranding about the pancreas, possible pancreatitis. Correlation with amylase and lipase is suggested. 6. Small ascites in the upper abdomen. 7. Coronary artery calcifications and aortic atherosclerosis. 8. Postsurgical changes in the cervical region with subcutaneous air. 9. Hyperdense material in the gallbladder, possible stones or sludge.  Electronically Signed: By: Leita Birmingham M.D. On: 07/17/2024 17:40   CT HEAD WO CONTRAST ( ) Result Date: 07/17/2024 EXAM: CT HEAD WITHOUT CONTRAST 07/17/2024 05:18:17 PM TECHNIQUE: CT of the head was performed without the administration of intravenous contrast. Automated exposure control, iterative reconstruction, and/or weight based adjustment of the mA/kV was utilized to reduce the radiation dose to as low as reasonably achievable. COMPARISON: 07/16/2024 CLINICAL HISTORY: Multiple episodes of PEA cardiac arrest during attempted cervical spine surgery. FINDINGS: BRAIN AND VENTRICLES: No acute hemorrhage. No evidence of acute infarct. No hydrocephalus. No extra-axial collection. No mass effect or midline shift. Atrophy is similar to the prior study, mildly advanced for age. White matter hypoattenuation is similar to the prior study, mildly advanced for age. Calcified atherosclerosis at the skull base. ORBITS: Bilateral cataract extraction. No acute abnormality. SINUSES: Mild mucosal thickening in paranasal sinuses. Secretions in nasopharynx, likely related to intubation. SOFT TISSUES AND SKULL: No acute soft tissue abnormality. No skull fracture. IMPRESSION: 1. No acute intracranial abnormality. 2. Mildly advanced cerebral atrophy and mild chronic small vessel ischemic disease, similar to the prior study. Electronically signed by: Lonni Necessary MD 07/17/2024 05:49 PM EDT RP Workstation: [id number]   DG CHEST PORT 1 VIEW Result Date: 07/17/2024 CLINICAL DATA:  Central line placement and intubation. EXAM: PORTABLE CHEST 1 VIEW COMPARISON:  12/23/2020. FINDINGS: Endotracheal tube terminates approximately 5.9 cm above the carina. Nasogastric tube terminates in the stomach. Right IJ central line tip is in the SVC. A defibrillator pad overlies the lower left chest. Trachea is midline. Heart size stable. No airspace consolidation or pleural fluid. Surgical skin staples in the neck. IMPRESSION: 1.  Satisfactory support apparatus placement. 2. No acute findings. Electronically Signed   By: Newell Eke M.D.   On: 07/17/2024 16:24   DG C-Arm 1-60 Min-No Report Result Date: 07/17/2024 Fluoroscopy was utilized by the requesting physician.  No radiographic interpretation.   DG C-Arm 1-60 Min-No Report Result Date: 07/17/2024 Fluoroscopy was utilized by the requesting physician.  No radiographic interpretation.   DG C-Arm 1-60 Min-No Report Result Date: 07/17/2024  Fluoroscopy was utilized by the requesting physician.  No radiographic interpretation.   MR Cervical Spine W and Wo Contrast Addendum Date: 07/17/2024 ADDENDUM #1 ADDENDUM: Study discussed by telephone with PA Camie Pickle at 5:43 AM on 07/17/2024. ---------------------------------------------------- Electronically signed by: Helayne Hurst MD 07/17/2024 05:48 AM EDT RP Workstation: [id number]   Result Date: 07/17/2024 ORIGINAL REPORT EXAM: MRI CERVICAL SPINE WITH AND WITHOUT CONTRAST 07/17/2024 05:05:21 AM TECHNIQUE: Multiplanar multisequence MRI of the cervical spine was performed with and without intravenous contrast. COMPARISON: Non-contrast cervical spine MRI and CT on 07/16/2024. CLINICAL HISTORY: 78 year old male, status post trauma. FINDINGS: Generally stable MRI appearance of the cervical spine. Sagittal CT images yesterday suggesting acute C7 and T1 fractures of bulky anterior flowing endplate osteophytes and also the superior T1 body (series 6 images 44 through 49 of that exam) Associated abnormal marrow edema in the anterior inferior C7 endplate and the anterior T1 . Mild degenerative appearing C6 superior endplate marrow edema and enhancement, no convincing fracture or compression. SPINAL CORD: Abnormal spinal cord T2 and STIR hyperintensity and expansion centered at the C7-T1 level, continuing to the C6-C7 disc space and toward the T1-T2 disc space. No abnormal cord enhancement following contrast. Small volume of  hemorrhagic cord contusion is difficult to exclude at C7-T1 on axial T2* images. SOFT TISSUES: Ongoing extensive posterior ligamentous and muscle signal abnormality, including the C7-T1 interspinous ligament, bilateral erector spinae muscle edema. C7-T1: Posterior disc or disc osteophyte complex. Severe left C8 neural foraminal stenosis and irregular appearance of the exiting nerve roots, left nerve root avulsion would be difficult to exclude. Small volume of superimposed intraspinal hemorrhage at C7-T1, asymmetric to the left, possibly affecting the epidural and intrathecal spaces. IMPRESSION: 1. Constellation of recent CT and MRI findings most compatible with acutely fractured previously ankylosed anterior C7-T1 level, with ligamentous disruption and traumatic cord injury. Cord contusion and edema. No abnormal cord enhancement, or findings to suggest an infection. 2. Small volume hemorrhage in the canal at that level, and difficult to exclude a left C8 traumatic nerve root avulsion. Electronically signed by: Helayne Hurst MD 07/17/2024 05:30 AM EDT RP Workstation: [id number]   MR Cervical Spine Wo Contrast Result Date: 07/17/2024 EXAM: MRI CERVICAL, THORACIC, AND LUMBAR SPINE WITHOUT CONTRAST 07/17/2024 12:56:48 AM TECHNIQUE: Multiplanar multisequence MRI of the cervical, thoracic, and lumbar spine was performed without the administration of intravenous contrast. COMPARISON: None available. CLINICAL HISTORY: Neck trauma, ligament injury suspected (Age >= 16y). FINDINGS: BONES AND ALIGNMENT: Abnormal marrow signal within the anterior and superior T1 vertebral body, suggestive of fracture/contusion. No significant height loss. Left c7 transverse process fracture better characterized on ecent CT of the cervical spine. SPINAL CORD: Edematous and mildly enlarged spinal cord at c7-T1. SOFT TISSUES: Trace prevertebral edema at the cervicothoracic junction. Edema within the c7-T1 and T1-T2 interspinous ligaments,  compatible with strain. There is also extensive surrounding paraspinal edema/strain. At C7-T1 there is disruption of the ligamentum flavum (for example, c series 1, image 8/9). Also, is there small volume epidural hemorrhage at this level (approximately 2-3 mm on series 4 image 42). The above findings and C7-T1 disc bulge result in moderate canal stenosis and bilateral foraminal stenosis at this level. Additional multilevel degenerative changes including severe right and moderate left foraminal stenosis at C3-C4 and C4-C5. Severe right and mild left foraminal stenosis at C5-C6. No significant canal stenosis besides C7-T1. No significant canal or foraminal stenosis in the thoracic spine. L1-L2: No significant disc herniation. No spinal canal stenosis or  neural foraminal narrowing. L2-L3: No significant disc herniation. No spinal canal stenosis or neural foraminal narrowing. L3-L4: Mild disc bulge. No significant stenosis. L4-L5: Broad disc bulge with superimposed central disc protrusion. Bilateral facet arthropathy. Resulting in mild canal, moderate right and mild left subarticular recess stenosis. Mild right foraminal stenosis. L5-S1: Mild disc bulging and endplate spurring. Bilateral facet arthropathy. No significant canal stenosis. Mild right subarticular recess stenosis. No significant foraminal stenosis. IMPRESSION: 1. Multiple traumatic C7-T1 findings including disruption of the ligamentum flavum with interspinous and posterior paraspinal ligamentous strain, traumatic cord contusion, and 2-3 mm thick epidural canal hemorrhage. 2. The above findings and C7-T1 disc bulge result in moderate canal and bilateral foraminal stenosis at this level. 3. Superior T1 endplate marrow edema, compatible with fracture/bone contusion given the above findings. No significant height loss. 4. Trace cervicothoracic prevertebral edema. 5. Superimposed multilevel degenerative changes including severe right foraminal stenosis at  C3-C4, C4-C5 and C5-C6. Electronically signed by: Gilmore Molt MD 07/17/2024 01:39 AM EDT RP Workstation: [id number]   MR THORACIC SPINE WO CONTRAST Result Date: 07/17/2024 EXAM: MRI CERVICAL, THORACIC, AND LUMBAR SPINE WITHOUT CONTRAST 07/17/2024 12:56:48 AM TECHNIQUE: Multiplanar multisequence MRI of the cervical, thoracic, and lumbar spine was performed without the administration of intravenous contrast. COMPARISON: None available. CLINICAL HISTORY: Neck trauma, ligament injury suspected (Age >= 16y). FINDINGS: BONES AND ALIGNMENT: Abnormal marrow signal within the anterior and superior T1 vertebral body, suggestive of fracture/contusion. No significant height loss. Left c7 transverse process fracture better characterized on ecent CT of the cervical spine. SPINAL CORD: Edematous and mildly enlarged spinal cord at c7-T1. SOFT TISSUES: Trace prevertebral edema at the cervicothoracic junction. Edema within the c7-T1 and T1-T2 interspinous ligaments, compatible with strain. There is also extensive surrounding paraspinal edema/strain. At C7-T1 there is disruption of the ligamentum flavum (for example, c series 1, image 8/9). Also, is there small volume epidural hemorrhage at this level (approximately 2-3 mm on series 4 image 42). The above findings and C7-T1 disc bulge result in moderate canal stenosis and bilateral foraminal stenosis at this level. Additional multilevel degenerative changes including severe right and moderate left foraminal stenosis at C3-C4 and C4-C5. Severe right and mild left foraminal stenosis at C5-C6. No significant canal stenosis besides C7-T1. No significant canal or foraminal stenosis in the thoracic spine. L1-L2: No significant disc herniation. No spinal canal stenosis or neural foraminal narrowing. L2-L3: No significant disc herniation. No spinal canal stenosis or neural foraminal narrowing. L3-L4: Mild disc bulge. No significant stenosis. L4-L5: Broad disc bulge with superimposed  central disc protrusion. Bilateral facet arthropathy. Resulting in mild canal, moderate right and mild left subarticular recess stenosis. Mild right foraminal stenosis. L5-S1: Mild disc bulging and endplate spurring. Bilateral facet arthropathy. No significant canal stenosis. Mild right subarticular recess stenosis. No significant foraminal stenosis. IMPRESSION: 1. Multiple traumatic C7-T1 findings including disruption of the ligamentum flavum with interspinous and posterior paraspinal ligamentous strain, traumatic cord contusion, and 2-3 mm thick epidural canal hemorrhage. 2. The above findings and C7-T1 disc bulge result in moderate canal and bilateral foraminal stenosis at this level. 3. Superior T1 endplate marrow edema, compatible with fracture/bone contusion given the above findings. No significant height loss. 4. Trace cervicothoracic prevertebral edema. 5. Superimposed multilevel degenerative changes including severe right foraminal stenosis at C3-C4, C4-C5 and C5-C6. Electronically signed by: Gilmore Molt MD 07/17/2024 01:39 AM EDT RP Workstation: [id number]   MR LUMBAR SPINE WO CONTRAST Result Date: 07/17/2024 EXAM: MRI CERVICAL, THORACIC, AND LUMBAR SPINE WITHOUT CONTRAST 07/17/2024 12:56:48  AM TECHNIQUE: Multiplanar multisequence MRI of the cervical, thoracic, and lumbar spine was performed without the administration of intravenous contrast. COMPARISON: None available. CLINICAL HISTORY: Neck trauma, ligament injury suspected (Age >= 16y). FINDINGS: BONES AND ALIGNMENT: Abnormal marrow signal within the anterior and superior T1 vertebral body, suggestive of fracture/contusion. No significant height loss. Left c7 transverse process fracture better characterized on ecent CT of the cervical spine. SPINAL CORD: Edematous and mildly enlarged spinal cord at c7-T1. SOFT TISSUES: Trace prevertebral edema at the cervicothoracic junction. Edema within the c7-T1 and T1-T2 interspinous ligaments, compatible  with strain. There is also extensive surrounding paraspinal edema/strain. At C7-T1 there is disruption of the ligamentum flavum (for example, c series 1, image 8/9). Also, is there small volume epidural hemorrhage at this level (approximately 2-3 mm on series 4 image 42). The above findings and C7-T1 disc bulge result in moderate canal stenosis and bilateral foraminal stenosis at this level. Additional multilevel degenerative changes including severe right and moderate left foraminal stenosis at C3-C4 and C4-C5. Severe right and mild left foraminal stenosis at C5-C6. No significant canal stenosis besides C7-T1. No significant canal or foraminal stenosis in the thoracic spine. L1-L2: No significant disc herniation. No spinal canal stenosis or neural foraminal narrowing. L2-L3: No significant disc herniation. No spinal canal stenosis or neural foraminal narrowing. L3-L4: Mild disc bulge. No significant stenosis. L4-L5: Broad disc bulge with superimposed central disc protrusion. Bilateral facet arthropathy. Resulting in mild canal, moderate right and mild left subarticular recess stenosis. Mild right foraminal stenosis. L5-S1: Mild disc bulging and endplate spurring. Bilateral facet arthropathy. No significant canal stenosis. Mild right subarticular recess stenosis. No significant foraminal stenosis. IMPRESSION: 1. Multiple traumatic C7-T1 findings including disruption of the ligamentum flavum with interspinous and posterior paraspinal ligamentous strain, traumatic cord contusion, and 2-3 mm thick epidural canal hemorrhage. 2. The above findings and C7-T1 disc bulge result in moderate canal and bilateral foraminal stenosis at this level. 3. Superior T1 endplate marrow edema, compatible with fracture/bone contusion given the above findings. No significant height loss. 4. Trace cervicothoracic prevertebral edema. 5. Superimposed multilevel degenerative changes including severe right foraminal stenosis at C3-C4, C4-C5 and  C5-C6. Electronically signed by: Gilmore Molt MD 07/17/2024 01:39 AM EDT RP Workstation: [id number]   CT Head Wo Contrast Result Date: 07/16/2024 EXAM: CT HEAD WITHOUT CONTRAST 07/16/2024 07:29:51 PM TECHNIQUE: CT of the head was performed without the administration of intravenous contrast. Automated exposure control, iterative reconstruction, and/or weight based adjustment of the mA/kV was utilized to reduce the radiation dose to as low as reasonably achievable. COMPARISON: None available. CLINICAL HISTORY: Polytrauma, blunt. weakness to bilateral legs since Thursday. This morning fell and unable to get up. Son states pt has been falling multiple times recently. Pt admits to hitting his head with some of falls. Denies any blood thinners. FINDINGS: BRAIN AND VENTRICLES: No acute hemorrhage. No evidence of acute infarct. No hydrocephalus. No extra-axial collection. No mass effect or midline shift. Mild generalized atrophy and white matter disease. Atherosclerotic calcifications in bilateral cavernous carotid arteries. ORBITS: No acute abnormality. Bilateral lens replacements noted. SINUSES: No acute abnormality. SOFT TISSUES AND SKULL: No acute soft tissue abnormality. No skull fracture. IMPRESSION: 1. No acute intracranial abnormality. Electronically signed by: Pinkie Pebbles MD 07/16/2024 07:40 PM EDT RP Workstation: [id number]   CT Thoracic Spine Wo Contrast Result Date: 07/16/2024 EXAM: CT THORACIC SPINE WITHOUT CONTRAST 07/16/2024 07:29:51 PM TECHNIQUE: CT of the thoracic spine was performed without the administration of intravenous contrast. Multiplanar reformatted images  are provided for review. Automated exposure control, iterative reconstruction, and/or weight based adjustment of the mA/kV was utilized to reduce the radiation dose to as low as reasonably achievable. COMPARISON: None available. CLINICAL HISTORY: Back trauma, no prior imaging (Age >= 16y). weakness to bilateral legs since  Thursday. This morning fell and unable to get up. Son states pt has been falling multiple times recently. Pt admits to hitting his head with some of falls. Denies any blood thinners. FINDINGS: BONES AND ALIGNMENT: Normal vertebral body heights. No acute fracture or suspicious bone lesion. Normal alignment. DEGENERATIVE CHANGES: Mild multilevel degenerative changes with bulky anterior osteophytosis. SOFT TISSUES: No acute abnormality. VASCULATURE: Mild thoracic aortic atherosclerosis. IMPRESSION: 1. No acute abnormality of the thoracic spine. Electronically signed by: Pinkie Pebbles MD 07/16/2024 07:39 PM EDT RP Workstation: [id number]   CT Lumbar Spine Wo Contrast Result Date: 07/16/2024 EXAM: CT OF THE LUMBAR SPINE WITHOUT CONTRAST 07/16/2024 07:29:51 PM TECHNIQUE: CT of the lumbar spine was performed without the administration of intravenous contrast. Multiplanar reformatted images are provided for review. Automated exposure control, iterative reconstruction, and/or weight based adjustment of the mA/kV was utilized to reduce the radiation dose to as low as reasonably achievable. COMPARISON: None available. CLINICAL HISTORY: Lumbar radiculopathy, trauma. weakness to bilateral legs since Thursday. This morning fell and unable to get up. Son states pt has been falling multiple times recently. Pt admits to hitting his head with some of falls. Denies any blood thinners. FINDINGS: BONES AND ALIGNMENT: Normal vertebral body heights. No acute fracture or suspicious bone lesion. Normal alignment. DEGENERATIVE CHANGES: Mild multilevel degenerative changes, most prominent at L5-S1, with bulky osteophytosis involving the lower lumbar spine. SOFT TISSUES: Vascular calcifications. IMPRESSION: 1. No acute osseous abnormality. Electronically signed by: Pinkie Pebbles MD 07/16/2024 07:37 PM EDT RP Workstation: [id number]   CT Cervical Spine Wo Contrast Result Date: 07/16/2024 EXAM: CT CERVICAL SPINE WITHOUT CONTRAST  07/16/2024 07:29:51 PM TECHNIQUE: CT of the cervical spine was performed without the administration of intravenous contrast. Multiplanar reformatted images are provided for review. Automated exposure control, iterative reconstruction, and/or weight based adjustment of the mA/kV was utilized to reduce the radiation dose to as low as reasonably achievable. COMPARISON: None available. CLINICAL HISTORY: Polytrauma, blunt. weakness to bilateral legs since Thursday. This morning fell and unable to get up. Son states pt has been falling multiple times recently. Pt admits to hitting his head with some of falls. Denies any blood thinners. FINDINGS: CERVICAL SPINE: BONES AND ALIGNMENT: Mildly displaced fracture of the left transverse process at C7 (coronal image 51). Vertebral body heights are maintained. DEGENERATIVE CHANGES: Mild to moderate multilevel degenerative changes with bulky anterior osteophytosis along the lower cervical / upper thoracic spine. SOFT TISSUES: No prevertebral soft tissue swelling. IMPRESSION: 1. Mildly displaced fracture of the left transverse process at C7. Electronically signed by: Pinkie Pebbles MD 07/16/2024 07:36 PM EDT RP Workstation: [id number]    Labs:  CBC: Recent Labs    07/18/24 0417 07/18/24 0425 07/18/24 1616 07/18/24 1707 07/18/24 1949 07/19/24 0113 07/19/24 0155 07/20/24 0513  WBC 16.1*  --  16.4*  --   --   --  14.0* 12.4*  HGB 12.9*   < > 10.5*   < > 8.5* 7.1* 9.0* 7.0*  HCT 36.8*   < > 29.6*   < > 25.0* 21.0* 26.3* 20.8*  PLT 199  --  125*  --   --   --  122* 123*   < > = values in this interval not  displayed.    COAGS: Recent Labs    07/17/24 0425 07/17/24 1451 07/17/24 2140 07/18/24 1616 07/18/24 2002  INR 1.0 1.7* 1.7*  --   --   APTT  --  49*  --  >200* >200*    BMP: Recent Labs    07/19/24 0155 07/19/24 1453 07/19/24 2100 07/20/24 0513  NA 135 135 137 137  K 3.8 4.4 4.6 4.7  CL 100 101 101 101  CO2 22 24 24 24   GLUCOSE 137* 131*  123* 131*  BUN 56* 69* 73* 84*  CALCIUM 8.0* 7.7* 7.6* 7.9*  CREATININE 4.18* 5.16* 5.41* 6.10*  GFRNONAA 14* 11* 10* 9*    LIVER FUNCTION TESTS: Recent Labs    07/16/24 1959 07/18/24 0417 07/19/24 0155 07/19/24 2100  BILITOT 0.7 1.3* 2.3*  --   AST 46* 1,476* 622*  --   ALT 20 978* 673*  --   ALKPHOS 103 58 50  --   PROT 7.3 4.3* 3.9*  --   ALBUMIN 4.3 2.1* 1.9* 1.8*    Assessment and Plan:  S/p PE thrombectomy on 10/27 - pt remains intubated. - is awake and answers yes/no questions appropriately with head nods - Right groin inari closure device removed at bedside today - No further intervention planned by IR, will sign off. Please reach out with any further questions or concerns.  Electronically Signed: Kimble VEAR Clas, PA-C 07/20/2024, 11:18 AM   I spent a total of 15 Minutes at the the patient's bedside AND on the patient's hospital floor or unit, greater than 50% of which was counseling/coordinating care for PE thrombectomy follow up.

## 2024-07-21 DIAGNOSIS — R579 Shock, unspecified: Secondary | ICD-10-CM | POA: Diagnosis not present

## 2024-07-21 DIAGNOSIS — I2699 Other pulmonary embolism without acute cor pulmonale: Secondary | ICD-10-CM | POA: Diagnosis not present

## 2024-07-21 DIAGNOSIS — I5082 Biventricular heart failure: Secondary | ICD-10-CM | POA: Diagnosis not present

## 2024-07-21 DIAGNOSIS — I469 Cardiac arrest, cause unspecified: Secondary | ICD-10-CM | POA: Diagnosis not present

## 2024-07-21 LAB — CBC
HCT: 22.2 % — ABNORMAL LOW (ref 39.0–52.0)
Hemoglobin: 7.8 g/dL — ABNORMAL LOW (ref 13.0–17.0)
MCH: 32 pg (ref 26.0–34.0)
MCHC: 35.1 g/dL (ref 30.0–36.0)
MCV: 91 fL (ref 80.0–100.0)
Platelets: 131 K/uL — ABNORMAL LOW (ref 150–400)
RBC: 2.44 MIL/uL — ABNORMAL LOW (ref 4.22–5.81)
RDW: 16.1 % — ABNORMAL HIGH (ref 11.5–15.5)
WBC: 10.6 K/uL — ABNORMAL HIGH (ref 4.0–10.5)
nRBC: 0.9 % — ABNORMAL HIGH (ref 0.0–0.2)

## 2024-07-21 LAB — RENAL FUNCTION PANEL
Albumin: 2.1 g/dL — ABNORMAL LOW (ref 3.5–5.0)
Albumin: 1.9 g/dL — ABNORMAL LOW (ref 3.5–5.0)
Anion gap: 12 (ref 5–15)
Anion gap: 10 (ref 5–15)
BUN: 68 mg/dL — ABNORMAL HIGH (ref 8–23)
BUN: 57 mg/dL — ABNORMAL HIGH (ref 8–23)
CO2: 22 mmol/L (ref 22–32)
CO2: 23 mmol/L (ref 22–32)
Calcium: 7.5 mg/dL — ABNORMAL LOW (ref 8.9–10.3)
Calcium: 7.6 mg/dL — ABNORMAL LOW (ref 8.9–10.3)
Chloride: 100 mmol/L (ref 98–111)
Chloride: 101 mmol/L (ref 98–111)
Creatinine, Ser: 4.16 mg/dL — ABNORMAL HIGH (ref 0.61–1.24)
Creatinine, Ser: 3.03 mg/dL — ABNORMAL HIGH (ref 0.61–1.24)
GFR, Estimated: 14 mL/min — ABNORMAL LOW (ref >60)
GFR, Estimated: 21 mL/min — ABNORMAL LOW (ref >60)
Glucose, Bld: 263 mg/dL — ABNORMAL HIGH (ref 70–99)
Glucose, Bld: 358 mg/dL — ABNORMAL HIGH (ref 70–99)
Phosphorus: 4.2 mg/dL (ref 2.5–4.6)
Phosphorus: 2.4 mg/dL — ABNORMAL LOW (ref 2.5–4.6)
Potassium: 4.4 mmol/L (ref 3.5–5.1)
Potassium: 5.1 mmol/L (ref 3.5–5.1)
Sodium: 134 mmol/L — ABNORMAL LOW (ref 135–145)
Sodium: 134 mmol/L — ABNORMAL LOW (ref 135–145)

## 2024-07-21 LAB — GLUCOSE, CAPILLARY
Glucose-Capillary: 255 mg/dL — ABNORMAL HIGH (ref 70–99)
Glucose-Capillary: 262 mg/dL — ABNORMAL HIGH (ref 70–99)
Glucose-Capillary: 312 mg/dL — ABNORMAL HIGH (ref 70–99)
Glucose-Capillary: 279 mg/dL — ABNORMAL HIGH (ref 70–99)
Glucose-Capillary: 265 mg/dL — ABNORMAL HIGH (ref 70–99)
Glucose-Capillary: 252 mg/dL — ABNORMAL HIGH (ref 70–99)
Glucose-Capillary: 173 mg/dL — ABNORMAL HIGH (ref 70–99)

## 2024-07-21 LAB — BPAM RBC
Blood Product Expiration Date: 202511052359
ISSUE DATE / TIME: 202510291308
Unit Type and Rh: 9500

## 2024-07-21 LAB — TYPE AND SCREEN
ABO/RH(D): O POS
Antibody Screen: NEGATIVE
Unit division: 0

## 2024-07-21 LAB — HEPARIN LEVEL (UNFRACTIONATED)
Heparin Unfractionated: 0.28 [IU]/mL — ABNORMAL LOW (ref 0.30–0.70)
Heparin Unfractionated: 0.34 [IU]/mL (ref 0.30–0.70)

## 2024-07-21 LAB — MAGNESIUM: Magnesium: 2.3 mg/dL (ref 1.7–2.4)

## 2024-07-21 LAB — PHOSPHORUS: Phosphorus: 4 mg/dL (ref 2.5–4.6)

## 2024-07-21 MED ORDER — INSULIN GLARGINE-YFGN 100 UNIT/ML ~~LOC~~ SOLN
10.0000 [IU] | Freq: Every day | SUBCUTANEOUS | Status: DC
  Administered 2024-07-21 – 2024-07-23 (×3): 10 [IU] via SUBCUTANEOUS
  Filled 2024-07-21 (×3): qty 0.1

## 2024-07-21 MED ORDER — INSULIN ASPART 100 UNIT/ML IJ SOLN
4.0000 [IU] | INTRAMUSCULAR | Status: DC
  Administered 2024-07-21 – 2024-07-22 (×5): 4 [IU] via SUBCUTANEOUS

## 2024-07-21 MED ORDER — POLYETHYLENE GLYCOL 3350 17 G PO PACK
17.0000 g | PACK | Freq: Two times a day (BID) | ORAL | Status: DC
  Administered 2024-07-21 – 2024-08-02 (×17): 17 g
  Filled 2024-07-21 (×19): qty 1

## 2024-07-21 MED ORDER — MIDODRINE HCL 5 MG PO TABS: 5.0000 mg | ORAL_TABLET | Freq: Three times a day (TID) | ORAL | Status: DC

## 2024-07-21 MED ORDER — MIDODRINE HCL 5 MG PO TABS
5.0000 mg | ORAL_TABLET | Freq: Three times a day (TID) | ORAL | Status: DC
  Filled 2024-07-21: qty 1

## 2024-07-21 MED ORDER — MIDODRINE HCL 5 MG PO TABS
5.0000 mg | ORAL_TABLET | Freq: Three times a day (TID) | ORAL | Status: DC
  Administered 2024-07-21 – 2024-07-22 (×4): 5 mg
  Filled 2024-07-21 (×3): qty 1

## 2024-07-21 MED ORDER — INSULIN ASPART 100 UNIT/ML IJ SOLN
0.0000 [IU] | INTRAMUSCULAR | Status: DC
  Administered 2024-07-21 (×3): 8 [IU] via SUBCUTANEOUS
  Administered 2024-07-21 – 2024-07-22 (×2): 3 [IU] via SUBCUTANEOUS
  Administered 2024-07-22: 8 [IU] via SUBCUTANEOUS
  Administered 2024-07-22 – 2024-07-23 (×5): 5 [IU] via SUBCUTANEOUS
  Administered 2024-07-23: 8 [IU] via SUBCUTANEOUS
  Administered 2024-07-23 (×2): 5 [IU] via SUBCUTANEOUS
  Administered 2024-07-23 (×2): 8 [IU] via SUBCUTANEOUS
  Administered 2024-07-24: 11 [IU] via SUBCUTANEOUS
  Administered 2024-07-24 (×3): 5 [IU] via SUBCUTANEOUS
  Administered 2024-07-24: 3 [IU] via SUBCUTANEOUS
  Administered 2024-07-24 – 2024-07-25 (×3): 8 [IU] via SUBCUTANEOUS
  Administered 2024-07-25: 5 [IU] via SUBCUTANEOUS

## 2024-07-21 MED ORDER — BISACODYL 10 MG RE SUPP
10.0000 mg | Freq: Every day | RECTAL | Status: DC
  Administered 2024-07-21 – 2024-07-23 (×3): 10 mg via RECTAL
  Filled 2024-07-21 (×4): qty 1

## 2024-07-21 MED ORDER — INSULIN ASPART 100 UNIT/ML IJ SOLN
2.0000 [IU] | INTRAMUSCULAR | Status: DC
  Administered 2024-07-21: 2 [IU] via SUBCUTANEOUS

## 2024-07-21 NOTE — Progress Notes (Signed)
 PHARMACY - ANTICOAGULATION CONSULT NOTE  Pharmacy Consult for heparin  Indication: pulmonary embolus  Allergies  Allergen Reactions   Penicillins Hives   Lucentis [Ranibizumab] Rash    Patient Measurements: Height: 6' 1 (185.4 cm) Weight: 82.8 kg (182 lb 8.7 oz) IBW/kg (Calculated) : 79.9 HEPARIN  DW (KG): 79.4  Vital Signs: Temp: 98.4 F (36.9 C) (10/30 0739) Temp Source: Oral (10/30 0739) BP: 116/44 (10/30 0615) Pulse Rate: 87 (10/30 1000)  Labs: Recent Labs    07/18/24 1616 07/18/24 1707 07/18/24 2002 07/18/24 2306 07/19/24 0155 07/19/24 0800 07/20/24 0513 07/20/24 1129 07/20/24 1639 07/20/24 1725 07/20/24 1849 07/21/24 0416  HGB 10.5*   < >  --    < > 9.0*  --  7.0* 6.6*  --  7.9*  --  7.8*  HCT 29.6*   < >  --    < > 26.3*  --  20.8* 19.4*  --  23.3*  --  22.2*  PLT 125*  --   --   --  122*  --  123*  --   --   --   --  131*  APTT >200*  --  >200*  --   --   --   --   --   --   --   --   --   HEPARINUNFRC  --   --  >1.10*   < >  --    < > 0.26*  --   --   --  0.34 0.28*  CREATININE 3.69*  --   --   --  4.18*   < > 6.10*  --  5.73*  --   --  4.16*   < > = values in this interval not displayed.    Estimated Creatinine Clearance: 17.1 mL/min (A) (by C-G formula based on SCr of 4.16 mg/dL (H)).   Assessment: JC is a 13 YOM admitted with concern for spinal cord injury. Patient taken to the OR for neurosurgery, procedure complicated by multiple rounds of CPR.   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 S/p IR 10/27 for thrombectomy.  Heparin  level 0.28 is therapeutic with heparin  running at 1000 units/hr. Hgb (7.8) is low stable after 1 unit PRBC on 10/29. PLTs (131) are stable. Per RN, no report of pauses, issues with the line, or signs of bleeding.   Goal of Therapy:  Heparin  level 0.3-0.7 units/ml Monitor platelets by anticoagulation protocol: Yes   Plan:  Increase heparin  infusion to 1100  units/hr Check 8 hour heparin  level Monitor daily heparin  level, CBC, and signs/symptoms of bleeding F/u plans for transition to oral [facility]   Thank you for allowing pharmacy to be a part of this patient's care.   Nidia Schaffer, PharmD PGY2 Cardiology Pharmacy Resident  Please check AMION for all [facility] Pharmacy phone numbers After 10:00 PM, call Main Pharmacy [phone] 07/21/2024  10:17 AM

## 2024-07-21 NOTE — Plan of Care (Signed)
  Problem: Clinical Measurements: Goal: Ability to maintain clinical measurements within normal limits will improve Outcome: Progressing Goal: Will remain free from infection Outcome: Progressing Goal: Diagnostic test results will improve Outcome: Progressing Goal: Respiratory complications will improve Outcome: Progressing Goal: Cardiovascular complication will be avoided Outcome: Progressing   Problem: Nutrition: Goal: Adequate nutrition will be maintained Outcome: Progressing   Problem: Coping: Goal: Level of anxiety will decrease Outcome: Progressing   Problem: Elimination: Goal: Will not experience complications related to bowel motility Outcome: Progressing Goal: Will not experience complications related to urinary retention Outcome: Progressing   Problem: Pain Managment: Goal: General experience of comfort will improve and/or be controlled Outcome: Progressing   Problem: Fluid Volume: Goal: Ability to maintain a balanced intake and output will improve Outcome: Progressing   Problem: Skin Integrity: Goal: Risk for impaired skin integrity will decrease Outcome: Progressing   Problem: Metabolic: Goal: Ability to maintain appropriate glucose levels will improve Outcome: Progressing   Problem: Tissue Perfusion: Goal: Adequacy of tissue perfusion will improve Outcome: Progressing

## 2024-07-21 NOTE — Progress Notes (Addendum)
 NAME:  Lawrence [name], MRN:  [id number], DOB:  16-Aug-1946, LOS: 4 ADMISSION DATE:  07/16/2024, CONSULTATION DATE: 07/17/2024 REFERRING MD:  Debby Carrier , CHIEF COMPLAINT: Status postcardiac arrest  History of Present Illness:  78 year old male with diabetes, hypertension, BPH status post TURP who presented after having a ground-level fall with recent onset of numbness in his lower extremity- complete loss of sensation to BLE & urinary incontinence after fall.      Neurosurgery was consulted, patient mental OR for partial anterior cervical discectomy C7-T1 but course was complicated with multiple time PEA cardiac arrest in OR, procedure was aborted, TEE was done which showed severe RV dysfunction and dilatation with moderate to severe LV dysfunction, patient was transferred to ICU for close monitoring.  In ICU patient blood pressure started dropping and he went into PEA cardiac arrest again, ROSC was achieved after 2 minutes.  Unfortunately due to surgery, it was recommended no TNK/TPA.  Due to tenuous status and concern for re-arrest it was decided to hold on suction thrombectomy at the time.   Pertinent  Medical History   Past Medical History:  Diagnosis Date   Arthritis    Diabetes mellitus without complication (HCC)    GERD (gastroesophageal reflux disease)    Hypertension    Sleep apnea     Significant Hospital Events: Including procedures, antibiotic start and stop dates in addition to other pertinent events   Admitted and PCCM consulted, coded multiple times in OR (PEA) 07/18/2024 mechanical thrombectomy was done for bilateral pulmonary embolism. 10/28 nephro consult 10/29 on 5 NE, plan for HD cath placement and CRRT   Interim History / Subjective:   CRRT overnight  Weaning NE, now at 4   CBGs high >200   Objective    Blood pressure (!) 116/44, pulse 87, temperature 98.4 F (36.9 C), temperature source Oral, resp. rate 16, height 6' 1 (1.854 m), weight 82.8 kg, SpO2  95%. CVP:  [0 mmHg-12 mmHg] 6 mmHg  FiO2 (%):  [24 %] 24 %   Intake/Output Summary (Last 24 hours) at 07/21/2024 1045 Last data filed at 07/21/2024 1000 Gross per 24 hour  Intake 1750.69 ml  Output 2326.6 ml  Net -575.91 ml   Filed Weights   07/18/24 0430 07/19/24 0430 07/21/24 0430  Weight: 79.5 kg 82.3 kg 82.8 kg    Examination: General: critically ill appearing older adult M NAD  HEENT: C collar in place. Anterior honeycomb dressing w some dried blood  Neuro: Awake. 2/5 LUE 3/5 RUE. Insensate BLE  Chest: coarse sounds bilaterally Heart:  rr s1s2  Abdomen: soft ndnt  GU: foley  Skin: c/d/w    Resolved problem list  Lactic acidosis  Endotracheally intubated  Assessment and Plan   PEA arrest Massive PE Acute BiV HF - improving Shock  -Status post mechanical thrombectomy on 07/18/2024 Plan:  -NE for MAP 65 -add midodrine  -hep gtt   Acute hypoxic/hypercarbic resp failure  Traumatic rib fx Bilat PNA  OSA P -wean [place] -- goal SpO2 > 92 -pulm hygiene -rocephin  -clarifying if he uses a CPAP at home / if so what kind of mask. 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  P -NSGY following -cont C collar -Spine precautions -PT/OT/SLP  -will touch base w Neuro ICU re possible lateral   AKI  P -nephro following, cont CRRT   Elevated LFTs, likely shock liver  -PRN LFTs    Anemia -follow CBC   DM w Hyperglycemia -incr insulin  coverage 10/30  Inadequate PO intake -adv EN per RDN -SLP consult   Labs   CBC: Recent Labs  Lab 07/16/24 1959 07/17/24 0425 07/18/24 0417 07/18/24 0425 07/18/24 1616 07/18/24 1707 07/19/24 0155 07/20/24 0513 07/20/24 1129  07/20/24 1725 07/21/24 0416  WBC 12.0*   < > 16.1*  --  16.4*  --  14.0* 12.4*  --   --  10.6*  NEUTROABS 10.3*  --   --   --   --   --   --   --   --   --   --   HGB 14.7   < > 12.9*   < > 10.5*   < > 9.0* 7.0* 6.6* 7.9* 7.8*  HCT 40.6   < > 36.8*   < > 29.6*   < > 26.3* 20.8* 19.4* 23.3* 22.2*  MCV 89.6   < > 90.9  --  89.4  --  92.6 95.9  --   --  91.0  PLT 253   < > 199  --  125*  --  122* 123*  --   --  131*   < > = values in this interval not displayed.    Basic Metabolic Panel: Recent Labs  Lab 07/17/24 0425 07/17/24 1352 07/17/24 2140 07/17/24 2145 07/18/24 0417 07/18/24 0425 07/18/24 1616 07/18/24 1707 07/19/24 1453 07/19/24 2100 07/20/24 0513 07/20/24 1639 07/21/24 0416  NA 135   < > 138   < > 134*   < > 138   < > 135 137 137 135 134*  K 3.6   < > 4.1   < > 3.7   < > 3.2*   < > 4.4 4.6 4.7 5.0 4.4  CL 98   < > 98  --  100  --  103   < > 101 101 101 100 100  CO2 24   < > 19*  --  19*  --  19*   < > 24 24 24  21* 22  GLUCOSE 180*   < > 393*  --  372*  --  144*   < > 131* 123* 131* 158* 263*  BUN 22   < > 35*  --  41*  --  50*   < > 69* 73* 84* 88* 68*  CREATININE 0.99   < > 2.37*  --  2.90*  --  3.69*   < > 5.16* 5.41* 6.10* 5.73* 4.16*  CALCIUM 9.0   < > 9.3  --  8.6*  --  8.3*   < > 7.7* 7.6* 7.9* 7.4* 7.5*  MG 2.2  --  2.0  --  1.8  --  1.9  --   --   --   --   --  2.3  PHOS 3.4  --   --   --   --   --   --   --   --  6.4*  --  6.3* 4.0  4.2   < > = values in this interval not displayed.   GFR: Estimated Creatinine Clearance: 17.1 mL/min (A) (by C-G formula based on SCr of 4.16 mg/dL (H)). Recent Labs  Lab 07/17/24 1756 07/17/24 2140  07/18/24 0417 07/18/24 1616 07/19/24 0155 07/20/24 0513 07/21/24 0416  WBC  --    < > 16.1* 16.4* 14.0* 12.4* 10.6*  LATICACIDVEN 8.6*  --  4.6* 3.2* 1.4  --   --    < > = values in this interval not displayed.    Liver Function Tests: Recent Labs  Lab 07/16/24 1959 07/18/24 0417 07/19/24 0155 07/19/24 2100  07/20/24 1639 07/21/24 0416  AST 46* 1,476* 622*  --   --   --   ALT 20 978* 673*  --   --   --   ALKPHOS 103 58 50  --   --   --   BILITOT 0.7 1.3* 2.3*  --   --   --   PROT 7.3 4.3* 3.9*  --   --   --   ALBUMIN 4.3 2.1* 1.9* 1.8* 1.9* 2.1*   Recent Labs  Lab 07/18/24 0417  LIPASE 29   No results for input(s): AMMONIA in the last 168 hours.  ABG    Component Value Date/Time   PHART 7.461 (H) 07/19/2024 0113   PCO2ART 29.2 (L) 07/19/2024 0113   PO2ART 158 (H) 07/19/2024 0113   HCO3 20.8 07/19/2024 0113   TCO2 22 07/19/2024 0113   ACIDBASEDEF 3.0 (H) 07/19/2024 0113   O2SAT 63.1 07/20/2024 1200     Coagulation Profile: Recent Labs  Lab 07/17/24 0425 07/17/24 1451 07/17/24 2140  INR 1.0 1.7* 1.7*    Cardiac Enzymes: Recent Labs  Lab 07/16/24 1959 07/17/24 0425  CKTOTAL 1,321* 938*    HbA1C: Hemoglobin A1C  Date/Time Value Ref Range Status  07/23/2021 12:00 AM 11.9  Final  10/27/2020 12:00 AM 9.3  Final   HbA1c, POC (controlled diabetic range)  Date/Time Value Ref Range Status  11/19/2021 11:44 AM 10.2 (A) 0.0 - 7.0 % Final   HbA1c POC (<> result, manual entry)  Date/Time Value Ref Range Status  03/18/2022 03:21 PM 10.8 4.0 - 5.6 % Final   Hgb A1c MFr Bld  Date/Time Value Ref Range Status  07/17/2024 04:25 AM 12.3 (H) 4.8 - 5.6 % Final    Comment:    (NOTE) Diagnosis of Diabetes The following HbA1c ranges recommended by the American Diabetes Association (ADA) may be used as an aid in the diagnosis of diabetes mellitus.  Hemoglobin             Suggested A1C NGSP%              Diagnosis  <5.7                   Non Diabetic  5.7-6.4                Pre-Diabetic  >6.4                   Diabetic  <7.0                   Glycemic control for                       adults with diabetes.    12/02/2022 11:30 AM 11.9 (H) 4.8 - 5.6 % Final    Comment:    (NOTE)         Prediabetes: 5.7 - 6.4         Diabetes: >6.4         Glycemic control for  adults with diabetes: <7.0  CBG: Recent Labs  Lab 07/20/24 1939 07/20/24 2312 07/21/24 0311 07/21/24 0735 07/21/24 0741  GLUCAP 167* 196* 255* 312* 262*    CRITICAL CARE Performed by: Ronnald FORBES Gave   Total critical care time: 43 minutes  Critical care time was exclusive of separately billable procedures and treating other patients. Critical care was necessary to treat or prevent imminent or life-threatening deterioration.  Critical care was time spent personally by me on the following activities: development of treatment plan with patient and/or surrogate as well as nursing, discussions with consultants, evaluation of patient's response to treatment, examination of patient, obtaining history from patient or surrogate, ordering and performing treatments and interventions, ordering and review of laboratory studies, ordering and review of radiographic studies, pulse oximetry and re-evaluation of patient's condition.  Ronnald Gave MSN, AGACNP-BC [facility] [place] [facility]/Critical Care Medicine Amion for pager  07/21/2024, 10:45 AM

## 2024-07-21 NOTE — Progress Notes (Signed)
 PHARMACY - ANTICOAGULATION CONSULT NOTE  Pharmacy Consult for heparin  Indication: pulmonary embolus  Allergies  Allergen Reactions   Penicillins Hives   Lucentis [Ranibizumab] Rash    Patient Measurements: Height: 6' 1 (185.4 cm) Weight: 82.8 kg (182 lb 8.7 oz) IBW/kg (Calculated) : 79.9 HEPARIN  DW (KG): 79.4  Vital Signs: Temp: 98.8 F (37.1 C) (10/30 1945) Temp Source: Oral (10/30 1945) BP: 127/49 (10/30 2015) Pulse Rate: 91 (10/30 2015)  Labs: Recent Labs    07/19/24 0155 07/19/24 0800 07/20/24 0513 07/20/24 1129 07/20/24 1639 07/20/24 1725 07/20/24 1849 07/21/24 0416 07/21/24 1650 07/21/24 1949  HGB 9.0*  --  7.0* 6.6*  --  7.9*  --  7.8*  --   --   HCT 26.3*  --  20.8* 19.4*  --  23.3*  --  22.2*  --   --   PLT 122*  --  123*  --   --   --   --  131*  --   --   HEPARINUNFRC  --    < > 0.26*  --   --   --  0.34 0.28*  --  0.34  CREATININE 4.18*   < > 6.10*  --  5.73*  --   --  4.16* 3.03*  --    < > = values in this interval not displayed.    Estimated Creatinine Clearance: 23.4 mL/min (A) (by C-G formula based on SCr of 3.03 mg/dL (H)).   Assessment: JC is a 64 YOM admitted with concern for spinal cord injury. Patient taken to the OR for neurosurgery, procedure complicated by multiple rounds of CPR.   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 S/p IR 10/27 for thrombectomy.  Heparin  level 0.34 is therapeutic with heparin  running at 1100 units/hr. Hgb (7.8) is low stable after 1 unit PRBC on 10/29. PLTs (131) are stable. Per RN, no report of pauses, issues with the line, or signs of bleeding.   Goal of Therapy:  Heparin  level 0.3-0.7 units/ml Monitor platelets by anticoagulation protocol: Yes   Plan:  Continue IV Heparin  at 1100 units/hr Monitor daily heparin  level, CBC, and signs/symptoms of bleeding F/u plans for transition to oral [facility]   Thank you for allowing pharmacy to be a part of this  patient's care.    Harlene Barlow, Berd[employer], BCCP Clinical Pharmacist  07/21/2024 8:47 PM   [facility] pharmacy phone numbers are listed on [email or web address]

## 2024-07-21 NOTE — Progress Notes (Signed)
 SLP Cancellation Note  Patient Details Name: Lawrence [name] MRN: [id number] DOB: 30-Dec-1945   Cancelled evaluation: Orders received for bedside swallow assessment; pt busy with staff during attempts to see today; will f/u next date.  Laloni Rowton L. Vona, MA CCC/SLP Clinical Specialist - Acute Care SLP Acute Rehabilitation Services Office number [phone]          Vona Palma Laurice 07/21/2024, 2:37 PM

## 2024-07-21 NOTE — Plan of Care (Signed)
  Problem: Clinical Measurements: Goal: Ability to maintain clinical measurements within normal limits will improve Outcome: Progressing Goal: Diagnostic test results will improve Outcome: Progressing   

## 2024-07-21 NOTE — Progress Notes (Signed)
 Lawrence [name] is an 78 y.o. male diabetes, hypertension, BPH status post TURP initially presenting after a ground-level fall about 5 days prior to admission. Of note over the past few weeks he has had neuropathy in his feet, felt more unsteady and used a cane because of his neuropathy.  Unfortunately he slipped and fell and hit his head on the sink followed by new onset numbness in his legs, inability to sit up and loss of function in his legs as well as urinary incontinence.  He was to have a partial anterior cervical discectomy C7-T1 to stabilize even though he was not likely to regain strength in his lower extremities but during the operative course he developed PEA arrest x 2 with subsequent CT scan showing a massive bilateral PE with poor lung perfusion.  Patient also had successful thrombectomy performed by VIR removing a large clot burden at the right PA bifurcation as well as a large clot burden in the left lower lung lobe. BL cr is approximately 0.9-1.1 (12/02/22) but with his complicated course he has developed renal failure with very poor urine output.  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 - He does not want long-term dialysis but is willing to give a trial with short-term dialysis to give his kidneys a chance to recover -Still anuric with K trending up --> starting CRRT on 10/29 mid afternoon. CVP only 4-5 tolerating UF 32ml/hr, no issues with filter clotting. + 6.6L during hospitalization. Explained to daughter who was bedside that this may be a prolonged course given significant renal injury, hopefully no cortical necrosis.  -Monitor Daily I/Os, Daily weight  -Maintain MAP>65 for optimal renal perfusion.  - Avoid nephrotoxic agents such as IV contrast, NSAIDs, and phosphate containing bowel preps (FLEETS)   PE s/p at least 2 PEA arrests with ROSC was not a  candidate for lytics because of surgery status post successful thrombectomy by VIR C7 fracture with paraplegia affecting the lower extremities which may not be reversible. Acute respiratory failure -off sedation and alert, and possible extubation trial pending weaning parameters Cardiogenic shock secondary to pulmonary embolus -off epinephrine, still on Levophed by cardiology  Subjective: Denies fever, chills, nausea, SOB; daughter bedside updated.    Chemistry and CBC: Creatinine  Date/Time Value Ref Range Status  02/21/2021 12:00 AM 1.2 0.6 - 1.3 Final  10/27/2020 12:00 AM 0.9 0.6 - 1.3 Final   Creatinine, Ser  Date/Time Value Ref Range Status  07/21/2024 04:16 AM 4.16 (H) 0.61 - 1.24 mg/dL Final  89/70/7974 95:60 PM 5.73 (H) 0.61 - 1.24 mg/dL Final  89/70/7974 94:86 AM 6.10 (H) 0.61 - 1.24 mg/dL Final  89/71/7974 90:99 PM 5.41 (H) 0.61 - 1.24 mg/dL Final  89/71/7974 97:46 PM 5.16 (H) 0.61 - 1.24 mg/dL Final  89/71/7974 98:44 AM 4.18 (H) 0.61 - 1.24 mg/dL Final  89/72/7974 95:83 PM 3.69 (H) 0.61 - 1.24 mg/dL Final  89/72/7974 95:82 AM 2.90 (H) 0.61 - 1.24 mg/dL Final  89/73/7974 90:59 PM 2.37 (H) 0.61 - 1.24 mg/dL Final  89/73/7974 97:48 PM 1.50 (H) 0.61 - 1.24 mg/dL Final  89/73/7974 95:74 AM 0.99 0.61 - 1.24 mg/dL Final  89/74/7974 92:40 PM 1.08 0.61 - 1.24 mg/dL Final  96/87/7975 89:41 AM 1.02 0.61 - 1.24 mg/dL Final  97/79/7976 98:95 PM 0.92 0.61 - 1.24 mg/dL Final  95/96/7977 93:65 PM 0.86 0.61 - 1.24 mg/dL Final  Recent Labs  Lab 07/17/24 0425 07/17/24 1352 07/18/24 1616 07/18/24 1707 07/19/24 0113 07/19/24 0155 07/19/24 1453 07/19/24 2100 07/20/24 0513 07/20/24 1639 07/21/24 0416  NA 135   < > 138   < > 140 135 135 137 137 135 134*  K 3.6   < > 3.2*   < > 3.3* 3.8 4.4 4.6 4.7 5.0 4.4  CL 98   < > 103  --   --  100 101 101 101 100 100  CO2 24   < > 19*  --   --  22 24 24 24  21* 22  GLUCOSE 180*   < > 144*  --   --  137* 131* 123* 131* 158* 263*  BUN 22   < >  50*  --   --  56* 69* 73* 84* 88* 68*  CREATININE 0.99   < > 3.69*  --   --  4.18* 5.16* 5.41* 6.10* 5.73* 4.16*  CALCIUM 9.0   < > 8.3*  --   --  8.0* 7.7* 7.6* 7.9* 7.4* 7.5*  PHOS 3.4  --   --   --   --   --   --  6.4*  --  6.3* 4.0  4.2   < > = values in this interval not displayed.   Recent Labs  Lab 07/16/24 1959 07/17/24 0425 07/18/24 1616 07/18/24 1707 07/19/24 0155 07/20/24 0513 07/20/24 1129 07/20/24 1725 07/21/24 0416  WBC 12.0*   < > 16.4*  --  14.0* 12.4*  --   --  10.6*  NEUTROABS 10.3*  --   --   --   --   --   --   --   --   HGB 14.7   < > 10.5*   < > 9.0* 7.0* 6.6* 7.9* 7.8*  HCT 40.6   < > 29.6*   < > 26.3* 20.8* 19.4* 23.3* 22.2*  MCV 89.6   < > 89.4  --  92.6 95.9  --   --  91.0  PLT 253   < > 125*  --  122* 123*  --   --  131*   < > = values in this interval not displayed.   Liver Function Tests: Recent Labs  Lab 07/16/24 1959 07/18/24 0417 07/19/24 0155 07/19/24 2100 07/20/24 1639 07/21/24 0416  AST 46* 1,476* 622*  --   --   --   ALT 20 978* 673*  --   --   --   ALKPHOS 103 58 50  --   --   --   BILITOT 0.7 1.3* 2.3*  --   --   --   PROT 7.3 4.3* 3.9*  --   --   --   ALBUMIN 4.3 2.1* 1.9* 1.8* 1.9* 2.1*   Recent Labs  Lab 07/18/24 0417  LIPASE 29   No results for input(s): AMMONIA in the last 168 hours. Cardiac Enzymes: Recent Labs  Lab 07/16/24 1959 07/17/24 0425  CKTOTAL 1,321* 938*   Iron Studies: No results for input(s): IRON, TIBC, TRANSFERRIN, FERRITIN in the last 72 hours. PT/INR: @LABRCNTIP (inr:5)  Xrays/Other Studies: ) Results for orders placed or performed during the hospital encounter of 07/16/24 (from the past 48 hours)  Glucose, capillary     Status: Abnormal   Collection Time: 07/19/24 11:09 AM  Result Value Ref Range   Glucose-Capillary 136 (H) 70 - 99 mg/dL    Comment: Glucose reference range applies only to samples taken after  fasting for at least 8 hours.  Basic metabolic panel     Status: Abnormal    Collection Time: 07/19/24  2:53 PM  Result Value Ref Range   Sodium 135 135 - 145 mmol/L   Potassium 4.4 3.5 - 5.1 mmol/L   Chloride 101 98 - 111 mmol/L   CO2 24 22 - 32 mmol/L   Glucose, Bld 131 (H) 70 - 99 mg/dL    Comment: Glucose reference range applies only to samples taken after fasting for at least 8 hours.   BUN 69 (H) 8 - 23 mg/dL   Creatinine, Ser 4.83 (H) 0.61 - 1.24 mg/dL   Calcium 7.7 (L) 8.9 - 10.3 mg/dL   GFR, Estimated 11 (L) >60 mL/min    Comment: (NOTE) Calculated using the CKD-EPI Creatinine Equation (2021)    Anion gap 10 5 - 15    Comment: Performed at [facility], [address] [place], [place] [address]  Glucose, capillary     Status: Abnormal   Collection Time: 07/19/24  3:12 PM  Result Value Ref Range   Glucose-Capillary 125 (H) 70 - 99 mg/dL    Comment: Glucose reference range applies only to samples taken after fasting for at least 8 hours.  Heparin  level (unfractionated)     Status: None   Collection Time: 07/19/24  6:49 PM  Result Value Ref Range   Heparin  Unfractionated 0.33 0.30 - 0.70 IU/mL    Comment: (NOTE) The clinical reportable range upper limit is being lowered to >1.10 to align with the FDA approved guidance for the current laboratory assay.  If heparin  results are below expected values, and patient dosage has  been confirmed, suggest follow up testing of antithrombin III levels. Performed at [facility], [address] [place], [place] [address]   Glucose, capillary     Status: Abnormal   Collection Time: 07/19/24  7:39 PM  Result Value Ref Range   Glucose-Capillary 136 (H) 70 - 99 mg/dL    Comment: Glucose reference range applies only to samples taken after fasting for at least 8 hours.  Renal function panel     Status: Abnormal   Collection Time: 07/19/24  9:00 PM  Result Value Ref Range   Sodium 137 135 - 145 mmol/L   Potassium 4.6 3.5 - 5.1 mmol/L   Chloride 101 98 - 111 mmol/L   CO2 24 22 - 32 mmol/L    Glucose, Bld 123 (H) 70 - 99 mg/dL    Comment: Glucose reference range applies only to samples taken after fasting for at least 8 hours.   BUN 73 (H) 8 - 23 mg/dL   Creatinine, Ser 4.58 (H) 0.61 - 1.24 mg/dL   Calcium 7.6 (L) 8.9 - 10.3 mg/dL   Phosphorus 6.4 (H) 2.5 - 4.6 mg/dL   Albumin 1.8 (L) 3.5 - 5.0 g/dL   GFR, Estimated 10 (L) >60 mL/min    Comment: (NOTE) Calculated using the CKD-EPI Creatinine Equation (2021)    Anion gap 12 5 - 15    Comment: Performed at [facility], [address] [place], [place] [address]  Glucose, capillary     Status: Abnormal   Collection Time: 07/19/24 11:07 PM  Result Value Ref Range   Glucose-Capillary 132 (H) 70 - 99 mg/dL    Comment: Glucose reference range applies only to samples taken after fasting for at least 8 hours.  Glucose, capillary     Status: Abnormal   Collection  Time: 07/20/24  3:12 AM  Result Value Ref Range   Glucose-Capillary 134 (H) 70 - 99 mg/dL    Comment: Glucose reference range applies only to samples taken after fasting for at least 8 hours.  CBC     Status: Abnormal   Collection Time: 07/20/24  5:13 AM  Result Value Ref Range   WBC 12.4 (H) 4.0 - 10.5 K/uL   RBC 2.17 (L) 4.22 - 5.81 MIL/uL   Hemoglobin 7.0 (L) 13.0 - 17.0 g/dL   HCT 79.1 (L) 60.9 - 47.9 %   MCV 95.9 80.0 - 100.0 fL   MCH 32.3 26.0 - 34.0 pg   MCHC 33.7 30.0 - 36.0 g/dL   RDW 88.4 88.4 - 84.4 %   Platelets 123 (L) 150 - 400 K/uL   nRBC 0.2 0.0 - 0.2 %    Comment: Performed at [facility], [address] [place], [place] [address]  Heparin  level (unfractionated)     Status: Abnormal   Collection Time: 07/20/24  5:13 AM  Result Value Ref Range   Heparin  Unfractionated 0.26 (L) 0.30 - 0.70 IU/mL    Comment: (NOTE) The clinical reportable range upper limit is being lowered to >1.10 to align with the FDA approved guidance for the current laboratory assay.  If heparin  results are below expected values, and patient dosage has  been  confirmed, suggest follow up testing of antithrombin III levels. Performed at [facility], [address] [place], [place] [address]   Basic metabolic panel     Status: Abnormal   Collection Time: 07/20/24  5:13 AM  Result Value Ref Range   Sodium 137 135 - 145 mmol/L   Potassium 4.7 3.5 - 5.1 mmol/L   Chloride 101 98 - 111 mmol/L   CO2 24 22 - 32 mmol/L   Glucose, Bld 131 (H) 70 - 99 mg/dL    Comment: Glucose reference range applies only to samples taken after fasting for at least 8 hours.   BUN 84 (H) 8 - 23 mg/dL   Creatinine, Ser 3.89 (H) 0.61 - 1.24 mg/dL   Calcium 7.9 (L) 8.9 - 10.3 mg/dL   GFR, Estimated 9 (L) >60 mL/min    Comment: (NOTE) Calculated using the CKD-EPI Creatinine Equation (2021)    Anion gap 12 5 - 15    Comment: Performed at [facility], [address] [place], [place] [address]  Glucose, capillary     Status: Abnormal   Collection Time: 07/20/24  8:29 AM  Result Value Ref Range   Glucose-Capillary 139 (H) 70 - 99 mg/dL    Comment: Glucose reference range applies only to samples taken after fasting for at least 8 hours.  Hemoglobin and hematocrit, blood     Status: Abnormal   Collection Time: 07/20/24 11:29 AM  Result Value Ref Range   Hemoglobin 6.6 (LL) 13.0 - 17.0 g/dL    Comment: This critical result has been called to ALMARIE BEAL RN by Gabriela Sorto on 07/20/2024 12:54:26, and has been read back. REPEATED TO VERIFY CORRECTED ON 10/29 AT 2159: PREVIOUSLY REPORTED AS 6.6 This critical result has been called to ALMARIE POUR. RN by Charolet Simmer on 07/20/2024 12:54:26, and has been read back.    HCT 19.4 (L) 39.0 - 52.0 %    Comment: Performed at [facility], [address] [place], [place] [address]  Cooxemetry Panel (carboxy, met, total hgb, O2 sat)     Status: Abnormal  Collection Time: 07/20/24 12:00 PM  Result Value Ref Range   Total hemoglobin <6.9 (LL) 12.0 - 16.0 g/dL    Comment: CRITICAL RESULT CALLED TO, READ BACK  BY AND VERIFIED WITH: KEARNES,D RN 1241 07/20/24 AMIREHSANIF    O2 Saturation 63.1 %   Carboxyhemoglobin 1.8 (H) 0.5 - 1.5 %   Methemoglobin <0.7 0.0 - 1.5 %    Comment: Performed at [facility], [address], [place], [place] [address]  Glucose, capillary     Status: Abnormal   Collection Time: 07/20/24 12:07 PM  Result Value Ref Range   Glucose-Capillary 147 (H) 70 - 99 mg/dL    Comment: Glucose reference range applies only to samples taken after fasting for at least 8 hours.  Prepare RBC (crossmatch)     Status: None   Collection Time: 07/20/24 12:44 PM  Result Value Ref Range   Order Confirmation      ORDER PROCESSED BY BLOOD BANK Performed at [facility], [address] [place], [place] [address]   Renal function panel (daily at 1600)     Status: Abnormal   Collection Time: 07/20/24  4:39 PM  Result Value Ref Range   Sodium 135 135 - 145 mmol/L   Potassium 5.0 3.5 - 5.1 mmol/L   Chloride 100 98 - 111 mmol/L   CO2 21 (L) 22 - 32 mmol/L   Glucose, Bld 158 (H) 70 - 99 mg/dL    Comment: Glucose reference range applies only to samples taken after fasting for at least 8 hours.   BUN 88 (H) 8 - 23 mg/dL   Creatinine, Ser 4.26 (H) 0.61 - 1.24 mg/dL   Calcium 7.4 (L) 8.9 - 10.3 mg/dL   Phosphorus 6.3 (H) 2.5 - 4.6 mg/dL   Albumin 1.9 (L) 3.5 - 5.0 g/dL   GFR, Estimated 10 (L) >60 mL/min    Comment: (NOTE) Calculated using the CKD-EPI Creatinine Equation (2021)    Anion gap 14 5 - 15    Comment: Performed at [facility], [address], [place], [place] [address]  Glucose, capillary     Status: Abnormal   Collection Time: 07/20/24  4:56 PM  Result Value Ref Range   Glucose-Capillary 156 (H) 70 - 99 mg/dL    Comment: Glucose reference range applies only to samples taken after fasting for at least 8 hours.  Hemoglobin and hematocrit, blood     Status: Abnormal   Collection Time: 07/20/24  5:25 PM  Result Value Ref Range   Hemoglobin 7.9 (L) 13.0 - 17.0  g/dL   HCT 76.6 (L) 60.9 - 47.9 %    Comment: Performed at [facility], [address], [place], [place] [address]  Heparin  level (unfractionated)     Status: None   Collection Time: 07/20/24  6:49 PM  Result Value Ref Range   Heparin  Unfractionated 0.34 0.30 - 0.70 IU/mL    Comment: (NOTE) The clinical reportable range upper limit is being lowered to >1.10 to align with the FDA approved guidance for the current laboratory assay.  If heparin  results are below expected values, and patient dosage has  been confirmed, suggest follow up testing of antithrombin III levels. Performed at [facility], [address] [place], [place] [address]   Glucose, capillary     Status: Abnormal   Collection Time: 07/20/24  7:39 PM  Result Value Ref Range   Glucose-Capillary 167 (H) 70 - 99 mg/dL  Comment: Glucose reference range applies only to samples taken after fasting for at least 8 hours.  Glucose, capillary     Status: Abnormal   Collection Time: 07/20/24 11:12 PM  Result Value Ref Range   Glucose-Capillary 196 (H) 70 - 99 mg/dL    Comment: Glucose reference range applies only to samples taken after fasting for at least 8 hours.  Glucose, capillary     Status: Abnormal   Collection Time: 07/21/24  3:11 AM  Result Value Ref Range   Glucose-Capillary 255 (H) 70 - 99 mg/dL    Comment: Glucose reference range applies only to samples taken after fasting for at least 8 hours.  CBC     Status: Abnormal   Collection Time: 07/21/24  4:16 AM  Result Value Ref Range   WBC 10.6 (H) 4.0 - 10.5 K/uL   RBC 2.44 (L) 4.22 - 5.81 MIL/uL   Hemoglobin 7.8 (L) 13.0 - 17.0 g/dL   HCT 77.7 (L) 60.9 - 47.9 %   MCV 91.0 80.0 - 100.0 fL   MCH 32.0 26.0 - 34.0 pg   MCHC 35.1 30.0 - 36.0 g/dL   RDW 83.8 (H) 88.4 - 84.4 %   Platelets 131 (L) 150 - 400 K/uL   nRBC 0.9 (H) 0.0 - 0.2 %    Comment: Performed at [facility], [address], [place], [place] [address]  Heparin  level  (unfractionated)     Status: Abnormal   Collection Time: 07/21/24  4:16 AM  Result Value Ref Range   Heparin  Unfractionated 0.28 (L) 0.30 - 0.70 IU/mL    Comment: (NOTE) The clinical reportable range upper limit is being lowered to >1.10 to align with the FDA approved guidance for the current laboratory assay.  If heparin  results are below expected values, and patient dosage has  been confirmed, suggest follow up testing of antithrombin III levels. Performed at [facility], [address] [place], [place] [address]   Renal function panel (daily at 0500)     Status: Abnormal   Collection Time: 07/21/24  4:16 AM  Result Value Ref Range   Sodium 134 (L) 135 - 145 mmol/L   Potassium 4.4 3.5 - 5.1 mmol/L   Chloride 100 98 - 111 mmol/L   CO2 22 22 - 32 mmol/L   Glucose, Bld 263 (H) 70 - 99 mg/dL    Comment: Glucose reference range applies only to samples taken after fasting for at least 8 hours.   BUN 68 (H) 8 - 23 mg/dL   Creatinine, Ser 5.83 (H) 0.61 - 1.24 mg/dL   Calcium 7.5 (L) 8.9 - 10.3 mg/dL   Phosphorus 4.2 2.5 - 4.6 mg/dL   Albumin 2.1 (L) 3.5 - 5.0 g/dL   GFR, Estimated 14 (L) >60 mL/min    Comment: (NOTE) Calculated using the CKD-EPI Creatinine Equation (2021)    Anion gap 12 5 - 15    Comment: Performed at [facility], [address], [place], [place] [address]  Magnesium     Status: None   Collection Time: 07/21/24  4:16 AM  Result Value Ref Range   Magnesium 2.3 1.7 - 2.4 mg/dL    Comment: Performed at [facility], [address] [place], [place] [address]  Phosphorus     Status: None   Collection Time: 07/21/24  4:16 AM  Result Value Ref Range   Phosphorus 4.0 2.5 - 4.6 mg/dL    Comment: Performed at [facility], [address] [place], [place] [address]  Glucose, capillary     Status: Abnormal   Collection Time: 07/21/24  7:35 AM  Result Value Ref Range   Glucose-Capillary 312 (H) 70 - 99 mg/dL    Comment: Glucose reference  range applies only to samples taken after fasting for at least 8 hours.  Glucose, capillary     Status: Abnormal   Collection Time: 07/21/24  7:41 AM  Result Value Ref Range   Glucose-Capillary 262 (H) 70 - 99 mg/dL    Comment: Glucose reference range applies only to samples taken after fasting for at least 8 hours.   ECHOCARDIOGRAM COMPLETE Result Date: 07/19/2024    ECHOCARDIOGRAM REPORT   Patient Name:   TOMASZ STEEVES Date of Exam: 07/19/2024 Medical Rec #:  [id number]     Height:       73.0 in Accession #:    [id number]    Weight:       181.4 lb Date of Birth:  1945/12/01     BSA:          2.064 m Patient Age:    78 years      BP:           118/55 mmHg Patient Gender: M             HR:           101 bpm. Exam Location:  Inpatient Procedure: 2D Echo, Cardiac Doppler, Color Doppler and Intracardiac            Opacification Agent (Both Spectral and Color Flow Doppler were            utilized during procedure). Indications:    R94.31 Abnormal EKG  History:        Patient has prior history of Echocardiogram examinations, most                 recent 12/24/2020. Abnormal ECG, Arrythmias:Cardiac Arrest;                 Signs/Symptoms:Chest Pain. Paraplegia. Broken spine. PE and                 arrest during surgery.  Sonographer:    Ellouise Mose RDCS Referring Phys: [id number] [facility]ICKS  Sonographer Comments: Technically difficult study due to poor echo windows and echo performed with patient supine and on artificial respirator. Patient with cervical brace, supine. IMPRESSIONS  1. Basal Septal hypertrophy with maximal thickness 18 mm     No resting systolic anterior motion of the mitral valve.     An intracavitary gradient is noted, but related to hyperdynamic function. Left ventricular ejection fraction, by estimation, is 70 to 75%. The left ventricle has hyperdynamic function. The left ventricle has no regional wall motion abnormalities. There is severe asymmetric left ventricular hypertrophy of the  basal-septal segment. Left ventricular diastolic parameters are consistent with Grade I diastolic dysfunction (impaired relaxation).  2. Right ventricular systolic function is low normal. The right ventricular size is normal. Tricuspid regurgitation signal is inadequate for assessing PA pressure.  3. The mitral valve was not well visualized. No evidence of mitral valve regurgitation.  4. The aortic valve was not well visualized. Aortic valve regurgitation is mild. No aortic stenosis is present.  5. The inferior vena cava is dilated in size with <50% respiratory variability, suggesting right atrial pressure of 15 mmHg. Comparison(s): Prior images reviewed side by side. Function is more dynamic than 2022  study. FINDINGS  Left Ventricle: Basal Septal hypertrophy with maximal thickness 18 mm No resting systolic anterior motion of the mitral valve. An intracavitary gradient is noted, but related to hyperdynamic function. Left ventricular ejection fraction, by estimation, is 70 to 75%. The left ventricle has hyperdynamic function. The left ventricle has no regional wall motion abnormalities. Definity contrast agent was given IV to delineate the left ventricular endocardial borders. The left ventricular internal cavity size was normal in size. 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 Left ventricular diastolic parameters are consistent with Grade I diastolic dysfunction (impaired relaxation). Right Ventricle: The right ventricular size is normal. Right vetricular wall thickness was not well visualized. Right ventricular systolic function is low normal. Tricuspid regurgitation signal is inadequate for assessing PA pressure. Left Atrium: Left atrial size was normal in size. Right Atrium: Right atrial size was normal in size. Pericardium: Trivial pericardial effusion is present. The pericardial effusion is posterior to the left ventricle. Mitral Valve: The mitral valve was not well  visualized. No evidence of mitral valve regurgitation. Tricuspid Valve: The tricuspid valve is normal in structure. Tricuspid valve regurgitation is not demonstrated. No evidence of tricuspid stenosis. Aortic Valve: The aortic valve was not well visualized. Aortic valve regurgitation is mild. No aortic stenosis is present. Pulmonic Valve: The pulmonic valve was not well visualized. Pulmonic valve regurgitation is not visualized. No evidence of pulmonic stenosis. Aorta: The aortic root and ascending aorta are structurally normal, with no evidence of dilitation. Venous: The inferior vena cava is dilated in size with less than 50% respiratory variability, suggesting right atrial pressure of 15 mmHg. IAS/Shunts: The interatrial septum was not well visualized.  LEFT VENTRICLE PLAX 2D LVIDd:         3.80 cm     Diastology LVIDs:         2.50 cm     LV e' medial:    4.68 cm/s LV PW:         1.50 cm     LV E/e' medial:  12.9 LV IVS:        1.60 cm     LV e' lateral:   6.09 cm/s LVOT diam:     2.30 cm     LV E/e' lateral: 9.9 LV SV:         56 LV SV Index:   27 LVOT Area:     4.15 cm  LV Volumes (MOD) LV vol d, MOD A2C: 39.8 ml LV vol d, MOD A4C: 40.4 ml LV vol s, MOD A2C: 9.8 ml LV vol s, MOD A4C: 10.0 ml LV SV MOD A2C:     30.0 ml LV SV MOD A4C:     40.4 ml LV SV MOD BP:      27.3 ml RIGHT VENTRICLE             IVC RV S prime:     11.30 cm/s  IVC diam: 2.20 cm TAPSE (M-mode): 1.1 cm LEFT ATRIUM             Index       RIGHT ATRIUM          Index LA diam:        2.30 cm 1.11 cm/m  RA Area:     7.14 cm LA Vol (A2C):   10.4 ml 5.04 ml/m  RA Volume:   10.40 ml 5.04 ml/m LA Vol (A4C):   12.9 ml 6.25 ml/m LA Biplane Vol: 11.7 ml  5.67 ml/m  AORTIC VALVE LVOT Vmax:   92.70 cm/s LVOT Vmean:  61.600 cm/s LVOT VTI:    0.135 m  AORTA Ao Root diam: 3.70 cm Ao Asc diam:  3.20 cm MITRAL VALVE MV Area (PHT): 3.85 cm     SHUNTS MV Decel Time: 197 msec     Systemic VTI:  0.14 m MV E velocity: 60.30 cm/s   Systemic Diam: 2.30 cm MV A  velocity: 102.00 cm/s MV E/A ratio:  0.59 Stanly Leavens MD Electronically signed by Stanly Leavens MD Signature Date/Time: 07/19/2024/11:55:33 AM    Final     PMH:   Past Medical History:  Diagnosis Date   Arthritis    Diabetes mellitus without complication (HCC)    GERD (gastroesophageal reflux disease)    Hypertension    Sleep apnea     PSH:   Past Surgical History:  Procedure Laterality Date   ANTERIOR CERVICAL DECOMP/DISCECTOMY FUSION N/A 07/17/2024   Procedure: ANTERIOR CERVICAL DECOMPRESSION/DISCECTOMY FUSION, CERVICAL SEVEN - THORACIC ONE;  Surgeon: Debby Dorn MATSU, MD;  Location: [facility] OR;  Service: Neurosurgery;  Laterality: N/A;  Partial Discecomy Cervical seven- Thoracic one   APPENDECTOMY     78 yo   BACK SURGERY  1975   lumbar diskectomy   done [facility]   COLONOSCOPY N/A 02/21/2015   Procedure: COLONOSCOPY;  Surgeon: Claudis RAYMOND Rivet, MD;  Location: [facility];  Service: Endoscopy;  Laterality: N/A;  1025   COLONOSCOPY  11/13/2021   COLONOSCOPY WITH PROPOFOL  N/A 11/13/2021   Procedure: COLONOSCOPY WITH PROPOFOL ;  Surgeon: Rivet Claudis RAYMOND, MD;  Location: [facility];  Service: Endoscopy;  Laterality: N/A;  155   CYSTOSCOPY WITH URETHRAL DILATATION N/A 12/09/2022   Procedure: CYSTOSCOPY WITH URETHRAL DILATATION;  Surgeon: Watt Rush, MD;  Location: [facility] ORS;  Service: Urology;  Laterality: N/A;   EYE SURGERY Bilateral    Lasik surgery   IR ANGIOGRAM PULMONARY BILATERAL SELECTIVE  07/18/2024   IR ANGIOGRAM SELECTIVE EACH ADDITIONAL VESSEL  07/18/2024   IR THROMBECT PRIM MECH ADD (INCLU) MOD SED  07/18/2024   IR THROMBECT PRIM MECH ADD (INCLU) MOD SED  07/18/2024   IR THROMBECT PRIM MECH ADD (INCLU) MOD SED  07/18/2024   LEFT HEART CATH AND CORONARY ANGIOGRAPHY N/A 12/23/2020   Procedure: LEFT HEART CATH AND CORONARY ANGIOGRAPHY;  Surgeon: Burnard Debby LABOR, MD;  Location: [facility] INVASIVE CV LAB;  Service: Cardiovascular;  Laterality: N/A;   NECK  SURGERY  1999   Work injury, posterior fusion   POLYPECTOMY  11/13/2021   Procedure: POLYPECTOMY INTESTINAL;  Surgeon: Rivet Claudis RAYMOND, MD;  Location: [facility];  Service: Endoscopy;;   SHOULDER SURGERY Bilateral 2000   rotoator cuff repair   done at [facility]   TONSILLECTOMY     TRANSURETHRAL INCISION OF PROSTATE N/A 12/09/2022   Procedure: TRANSURETHRAL RESECTION OF PROSTATE;  Surgeon: Watt Rush, MD;  Location: [facility]S;  Service: Urology;  Laterality: N/A;  1 HR FOR CASE    Allergies:  Allergies  Allergen Reactions   Penicillins Hives   Lucentis [Ranibizumab] Rash    Medications:   Prior to Admission medications   Medication Sig Start Date End Date Taking? Authorizing Provider  Apple Cider Vinegar 600 MG CAPS Take 600 mg by mouth daily.   Yes [provider]  aspirin  EC 81 MG tablet Take 1 tablet (81 mg total) by mouth every other day. 11/14/21  Yes Rehman, Claudis RAYMOND, MD  Barberry-Oreg Grape-Goldenseal (BERBERINE  COMPLEX PO) Take 1 capsule by mouth daily.   Yes [provider]  cholecalciferol (VITAMIN D3) 25 MCG (1000 UNIT) tablet Take 1,000 Units by mouth daily.   Yes [provider]  Cinnamon 500 MG capsule Take 500 mg by mouth daily.   Yes [provider]  diphenhydrAMINE HCl (NERVINE PO) Take 1 tablet by mouth daily. Patient states that he takes one a day   Yes [provider]  empagliflozin (JARDIANCE) 10 MG TABS tablet Take 10 mg by mouth daily.   Yes [provider]  ferrous sulfate 325 (65 FE) MG tablet Take 325 mg by mouth daily with breakfast.   Yes [provider]  hydrochlorothiazide  (HYDRODIURIL ) 25 MG tablet Take 25 mg by mouth daily. 07/23/21  Yes [provider]  insulin  degludec (TRESIBA  FLEXTOUCH) 100 UNIT/ML FlexTouch Pen Inject 12 Units into the skin at bedtime. Patient taking differently: Inject 28 Units into the skin 2 (two) times daily. 10/07/22  Yes Reardon, Benton PARAS, NP  Multiple  Vitamins-Minerals (OCUVITE ADULT 50+ PO) Take 1 tablet by mouth daily.   Yes [provider]  OVER THE COUNTER MEDICATION Take 1 capsule by mouth daily. Prostagenix   Yes [provider]  OVER THE COUNTER MEDICATION Take 1 capsule by mouth daily. Diabacore (Diabetic Supplement)   Yes [provider]  OVER THE COUNTER MEDICATION Take 1 tablet by mouth in the morning and at bedtime. Glucoceil   Yes [provider]  tadalafil  (CIALIS ) 5 MG tablet Take 1 tablet (5 mg total) by mouth daily as needed for erectile dysfunction. 12/06/20  Yes Watt Rush, MD  vitamin E 180 MG (400 UNITS) capsule Take 400 Units by mouth daily.   Yes [provider]  Vitamin Mixture (ESTER-C PO) Take 1,000 mg by mouth daily.   Yes [provider]    Discontinued Meds:   Medications Discontinued During This Encounter  Medication Reason   0.9 %  sodium chloride  infusion    lactated ringers  infusion Patient Transfer   insulin  aspart (novoLOG ) injection 0-7 Units Patient Transfer   lidocaine -EPINEPHrine (XYLOCAINE  W/EPI) 1 %-1:100000 (with pres) injection Patient Transfer   0.9 % irrigation (POUR BTL) Patient Transfer   Surgifoam 1 Gm with Thrombin 5,000 units (5 ml) topical solution Patient Transfer   HYDROmorphone  (DILAUDID ) injection 0.25-0.5 mg Patient Transfer   droperidol (INAPSINE) 2.5 MG/ML injection 0.625 mg Patient Transfer   insulin  glargine-yfgn (SEMGLEE) injection 28 Units    midazolam  (VERSED ) 2 MG/2ML injection Returned to ADS   Thrombi-Pad 3X3 pad 1 each    insulin  aspart (novoLOG ) injection 0-6 Units    insulin  glargine-yfgn (SEMGLEE) injection 20 Units    phenylephrine (NEO-SYNEPHRINE) 20mg /NS 250mL premix infusion    heparin  ADULT infusion 100 units/mL (25000 units/250mL)    fentaNYL  (SUBLIMAZE ) injection 25-50 mcg    midazolam  PF (VERSED ) injection 4 mg    norepinephrine (LEVOPHED) 4mg  in 250mL (0.016 mg/mL) premix infusion    heparin  ADULT  infusion 100 units/mL (25000 units/250mL)    vasopressin (PITRESSIN) 20 Units in 100 mL (0.2 unit/mL) infusion-*FOR SHOCK*    dexmedetomidine (PRECEDEX) 400 MCG/100ML (4 mcg/mL) infusion    midazolam  (VERSED ) 100 mg/100 mL (1 mg/mL) premix infusion    calcium chloride injection 1 g    Oral care mouth rinse    lactated ringers  infusion    fentaNYL  in NS 250mL (38mcg/ml) infusion-PREMIX    fentaNYL  (SUBLIMAZE ) bolus via infusion 25-100 mcg    EPINEPHrine (ADRENALIN) 5  mg in NS 250 mL (0.02 mg/mL) premix infusion    midazolam  (VERSED ) bolus via infusion 2 mg    insulin  regular, human (MYXREDLIN) 100 units/ 100 mL infusion    dextrose  50 % solution 0-50 mL    propofol  (DIPRIVAN ) 1000 MG/100ML infusion    lidocaine  (XYLOCAINE ) 1 % (with pres) injection 20 mL    glycopyrrolate (ROBINUL) injection 0.1 mg    heparin  injection 1,000-6,000 Units    ceFEPIme (MAXIPIME) 2 g in sodium chloride  0.9 % 100 mL IVPB    ceFEPIme (MAXIPIME) 2 g in sodium chloride  0.9 % 100 mL IVPB    feeding supplement (PROSource TF20) liquid 60 mL    insulin  aspart (novoLOG ) injection 0-9 Units    insulin  aspart (novoLOG ) injection 2 Units     Social History:  reports that he has never smoked. He has never used smokeless tobacco. He reports that he does not drink alcohol and does not use drugs.  Family History:  History reviewed. No pertinent family history.  Blood pressure (!) 116/44, pulse 96, temperature 98.4 F (36.9 C), temperature source Oral, resp. rate (!) 23, height 6' 1 (1.854 m), weight 82.8 kg, SpO2 97%. Physical Exam: General appearance: alert, cooperative, and appears stated age Head: NCAT Neck: no adenopathy, brace Resp: CTA b/l Cardio: regular rate and rhythm GI: SNDNT Extremities: 1+ edema Pulses: 2+ and symmetric     Gee Habig, LYNWOOD ORN, MD 07/21/2024, 9:43 AM

## 2024-07-22 DIAGNOSIS — I5082 Biventricular heart failure: Secondary | ICD-10-CM | POA: Diagnosis not present

## 2024-07-22 DIAGNOSIS — I469 Cardiac arrest, cause unspecified: Secondary | ICD-10-CM | POA: Diagnosis not present

## 2024-07-22 DIAGNOSIS — R579 Shock, unspecified: Secondary | ICD-10-CM | POA: Diagnosis not present

## 2024-07-22 DIAGNOSIS — I2699 Other pulmonary embolism without acute cor pulmonale: Secondary | ICD-10-CM | POA: Diagnosis not present

## 2024-07-22 LAB — RENAL FUNCTION PANEL
Albumin: 1.8 g/dL — ABNORMAL LOW (ref 3.5–5.0)
Albumin: 2.1 g/dL — ABNORMAL LOW (ref 3.5–5.0)
Anion gap: 11 (ref 5–15)
Anion gap: 12 (ref 5–15)
BUN: 54 mg/dL — ABNORMAL HIGH (ref 8–23)
BUN: 48 mg/dL — ABNORMAL HIGH (ref 8–23)
CO2: 24 mmol/L (ref 22–32)
CO2: 24 mmol/L (ref 22–32)
Calcium: 7.7 mg/dL — ABNORMAL LOW (ref 8.9–10.3)
Calcium: 7.7 mg/dL — ABNORMAL LOW (ref 8.9–10.3)
Chloride: 100 mmol/L (ref 98–111)
Chloride: 100 mmol/L (ref 98–111)
Creatinine, Ser: 2.57 mg/dL — ABNORMAL HIGH (ref 0.61–1.24)
Creatinine, Ser: 2.36 mg/dL — ABNORMAL HIGH (ref 0.61–1.24)
GFR, Estimated: 25 mL/min — ABNORMAL LOW (ref >60)
GFR, Estimated: 28 mL/min — ABNORMAL LOW (ref >60)
Glucose, Bld: 283 mg/dL — ABNORMAL HIGH (ref 70–99)
Glucose, Bld: 276 mg/dL — ABNORMAL HIGH (ref 70–99)
Phosphorus: 1.8 mg/dL — ABNORMAL LOW (ref 2.5–4.6)
Phosphorus: 3.5 mg/dL (ref 2.5–4.6)
Potassium: 5.3 mmol/L — ABNORMAL HIGH (ref 3.5–5.1)
Potassium: 4.9 mmol/L (ref 3.5–5.1)
Sodium: 135 mmol/L (ref 135–145)
Sodium: 136 mmol/L (ref 135–145)

## 2024-07-22 LAB — MAGNESIUM: Magnesium: 2.3 mg/dL (ref 1.7–2.4)

## 2024-07-22 LAB — CBC
HCT: 20.2 % — ABNORMAL LOW (ref 39.0–52.0)
Hemoglobin: 6.8 g/dL — CL (ref 13.0–17.0)
MCH: 31.8 pg (ref 26.0–34.0)
MCHC: 33.7 g/dL (ref 30.0–36.0)
MCV: 94.4 fL (ref 80.0–100.0)
Platelets: 129 K/uL — ABNORMAL LOW (ref 150–400)
RBC: 2.14 MIL/uL — ABNORMAL LOW (ref 4.22–5.81)
RDW: 15.9 % — ABNORMAL HIGH (ref 11.5–15.5)
WBC: 11.7 K/uL — ABNORMAL HIGH (ref 4.0–10.5)
nRBC: 1.6 % — ABNORMAL HIGH (ref 0.0–0.2)

## 2024-07-22 LAB — GLUCOSE, CAPILLARY
Glucose-Capillary: 217 mg/dL — ABNORMAL HIGH (ref 70–99)
Glucose-Capillary: 218 mg/dL — ABNORMAL HIGH (ref 70–99)
Glucose-Capillary: 226 mg/dL — ABNORMAL HIGH (ref 70–99)
Glucose-Capillary: 201 mg/dL — ABNORMAL HIGH (ref 70–99)
Glucose-Capillary: 265 mg/dL — ABNORMAL HIGH (ref 70–99)
Glucose-Capillary: 195 mg/dL — ABNORMAL HIGH (ref 70–99)
Glucose-Capillary: 219 mg/dL — ABNORMAL HIGH (ref 70–99)

## 2024-07-22 LAB — HEPARIN LEVEL (UNFRACTIONATED)
Heparin Unfractionated: 0.29 [IU]/mL — ABNORMAL LOW (ref 0.30–0.70)
Heparin Unfractionated: 0.47 [IU]/mL (ref 0.30–0.70)

## 2024-07-22 LAB — PHOSPHORUS: Phosphorus: 1.8 mg/dL — ABNORMAL LOW (ref 2.5–4.6)

## 2024-07-22 LAB — PREPARE RBC (CROSSMATCH)

## 2024-07-22 MED ORDER — SODIUM CHLORIDE 0.9% IV SOLUTION: Freq: Once | INTRAVENOUS | Status: AC

## 2024-07-22 MED ORDER — QUETIAPINE FUMARATE 25 MG PO TABS
25.0000 mg | ORAL_TABLET | Freq: Every day | ORAL | Status: DC
  Administered 2024-07-22 – 2024-07-27 (×6): 25 mg
  Filled 2024-07-22 (×6): qty 1

## 2024-07-22 MED ORDER — SODIUM PHOSPHATES 45 MMOLE/15ML IV SOLN
30.0000 mmol | Freq: Once | INTRAVENOUS | Status: AC
  Administered 2024-07-22: 30 mmol via INTRAVENOUS
  Filled 2024-07-22: qty 10

## 2024-07-22 MED ORDER — PRISMASOL BGK 2/3.5 32-2-3.5 MEQ/L EC SOLN: Status: DC

## 2024-07-22 MED ORDER — MIDODRINE HCL 5 MG PO TABS
10.0000 mg | ORAL_TABLET | Freq: Three times a day (TID) | ORAL | Status: DC
  Administered 2024-07-22 – 2024-07-25 (×9): 10 mg
  Filled 2024-07-22 (×9): qty 2

## 2024-07-22 MED ORDER — INSULIN ASPART 100 UNIT/ML IJ SOLN
6.0000 [IU] | INTRAMUSCULAR | Status: DC
  Administered 2024-07-22 – 2024-07-25 (×18): 6 [IU] via SUBCUTANEOUS

## 2024-07-22 NOTE — Plan of Care (Signed)
 Spoke w NSGY re mobility -- Maintain collar  With collar on ok to sit up in bed  Will relay to PT/OT/SLP/ RN   Ronnald Gave MSN, AGACNP-BC [facility] 07/22/2024, 10:04 AM

## 2024-07-22 NOTE — TOC Initial Note (Signed)
 Transition of Care [facility]) - Initial/Assessment Note    Patient Details  Name: Lawrence [name] MRN: [id number] Date of Birth: 28-Jul-1946  [facility] CM/SW Contact:    Justina Delcia Czar, RN Phone Number: [phone] 07/22/2024, 4:26 PM  Clinical Narrative:                 Spoke to pt and dtr/SIL at bedside. Dtr states pt was independent pta and lives at home with wife.  PT/OT recommended IP rehab.   CIR following for IP rehab.   Chart reviewed for discharge readiness, patient not medically stable for d/c. Inpatient CM/CSW will continue to monitor pt's advancement through interdisciplinary progression rounds.   If new pt transition needs arise, MD please place a TOC consult.    Expected Discharge Plan: IP Rehab Facility Barriers to Discharge: Continued Medical Work up   Patient Goals and CMS Choice Patient states their goals for this hospitalization and ongoing recovery are:: get better CMS Medicare.gov Compare Post Acute Care list provided to:: Patient Represenative (must comment) Choice offered to / list presented to : Adult Children      Expected Discharge Plan and Services   Discharge Planning Services: CM Consult Post Acute Care Choice: IP Rehab Living arrangements for the past 2 months: Single Family Home                                      Prior Living Arrangements/Services Living arrangements for the past 2 months: Single Family Home Lives with:: Spouse Patient language and need for interpreter reviewed:: Yes        Need for Family Participation in Patient Care: Yes (Comment) Care giver support system in place?: Yes (comment)   Criminal Activity/Legal Involvement Pertinent to Current Situation/Hospitalization: No - Comment as needed  Activities of Daily Living   ADL Screening (condition at time of admission) Independently performs ADLs?: No Does the patient have a NEW difficulty with bathing/dressing/toileting/self-feeding that is  expected to last >3 days?: Yes (Initiates electronic notice to provider for possible OT consult) Does the patient have a NEW difficulty with getting in/out of bed, walking, or climbing stairs that is expected to last >3 days?: Yes (Initiates electronic notice to provider for possible PT consult) Does the patient have a NEW difficulty with communication that is expected to last >3 days?: No Is the patient deaf or have difficulty hearing?: No Does the patient have difficulty seeing, even when wearing glasses/contacts?: No Does the patient have difficulty concentrating, remembering, or making decisions?: No  Permission Sought/Granted Permission sought to share information with : Case Manager, Family Supports, PCP Permission granted to share information with : Yes, Verbal Permission Granted  Share Information with NAME: Warren Maxwell  Permission granted to share info w AGENCY: IP rehab, DME, PCP  Permission granted to share info w Relationship: daughter  Permission granted to share info w Contact Information: [phone]  Emotional Assessment Appearance:: Appears stated age         Psych Involvement: No (comment)  Admission diagnosis:  Paraplegia (HCC) [G82.20] Weakness [R53.1] Elevated CK [R74.8] 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] History of sensory changes [Z92.89] Status post surgery [Z98.890] Patient Active Problem List   Diagnosis Date Noted   AKI (acute kidney injury) 07/20/2024   Elevated CK 07/18/2024   History of sensory changes 07/18/2024   Weakness 07/18/2024   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 [place] [address] Phone: [phone] Fax:  [phone]  [facility] [address] [place] [address] Phone: [phone] Fax: [phone]  [facility] - [place], [place] - [address] [place] [address] Phone: [phone] Fax: [phone]     Social Drivers of Health (SDOH) Social History: SDOH Screenings   Food Insecurity: No Food Insecurity (07/17/2024)  Housing: Low Risk  (07/17/2024)  Transportation Needs: No Transportation Needs (07/17/2024)  Utilities: Not At Risk (07/17/2024)  Social Connections: Moderately Isolated (07/17/2024)  Tobacco Use: Low Risk  (07/17/2024)   SDOH Interventions:     Readmission Risk Interventions     No data to display

## 2024-07-22 NOTE — Progress Notes (Signed)
 eLink Physician-Brief Progress Note Patient Name: Lawrence [name] DOB: 19-Jul-1946 MRN: [id number]   Date of Service  07/22/2024  HPI/Events of Note  Hemoglobin 6.8 gm / dl, no evidence of bleeding and hemodynamically stable / unchanged from baseline.  eICU Interventions  One unit of PRBC ordered transfused.        Mattisen Pohlmann U Caryl Fate 07/22/2024, 4:32 AM

## 2024-07-22 NOTE — Progress Notes (Signed)
 PHARMACY - ANTICOAGULATION CONSULT NOTE  Pharmacy Consult for heparin  Indication: pulmonary embolus  Allergies  Allergen Reactions   Penicillins Hives   Lucentis [Ranibizumab] Rash    Patient Measurements: Height: 6' 1 (185.4 cm) Weight: 84.5 kg (186 lb 4.6 oz) IBW/kg (Calculated) : 79.9 HEPARIN  DW (KG): 79.4  Vital Signs: Temp: 98.1 F (36.7 C) (10/31 1545) Temp Source: Axillary (10/31 1545) BP: 137/53 (10/31 1800) Pulse Rate: 86 (10/31 1800)  Labs: Recent Labs    07/20/24 0513 07/20/24 1129 07/20/24 1725 07/20/24 1849 07/21/24 0416 07/21/24 1650 07/21/24 1949 07/22/24 0334 07/22/24 1605  HGB 7.0*   < > 7.9*  --  7.8*  --   --  6.8*  --   HCT 20.8*   < > 23.3*  --  22.2*  --   --  20.2*  --   PLT 123*  --   --   --  131*  --   --  129*  --   HEPARINUNFRC 0.26*  --   --    < > 0.28*  --  0.34 0.29* 0.47  CREATININE 6.10*   < >  --   --  4.16* 3.03*  --  2.57*  --    < > = values in this interval not displayed.    Estimated Creatinine Clearance: 27.6 mL/min (A) (by C-G formula based on SCr of 2.57 mg/dL (H)).   Assessment: JC is a 24 YOM admitted with concern for spinal cord injury. Patient taken to the OR for neurosurgery, procedure complicated by multiple rounds of CPR. 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 S/p IR 10/27 for thrombectomy. No anticoagulation prior to admission. Pharmacy consulted for heparin .    Heparin  level 0.47 is therapeutic on 1200 units/hr.  Goal of Therapy:  Heparin  level 0.3-0.7 units/ml Monitor platelets by anticoagulation protocol: Yes   Plan:  Continue heparin  infusion to 1200 units/hr Monitor daily heparin  level, CBC, and signs/symptoms of bleeding F/u plans for transition to oral [facility]   Thank you for allowing pharmacy to be a part of this patient's care.    Jinnie Door, PharmD, BCPS, BCCP Clinical Pharmacist  Please check AMION for all [facility] Pharmacy phone  numbers After 10:00 PM, call Main Pharmacy [phone]

## 2024-07-22 NOTE — Evaluation (Signed)
 Occupational Therapy Evaluation Patient Details Name: Lawrence [name] MRN: [id number] DOB: [birth year] Today's Date: 07/22/2024   History of Present Illness   Pt is a 78 y.o. male who presented 07/16/24 s/p fall in which he hit his neck and head. MRI showed cord signal change C7-T1 concerning for spinal cord infarct or possibly transverse myelitis. Imaging also showed hairline fracture of the T1 body, mild PLC edema without disruption. S/p aborted partial anterior cervical discectomy C7-T1 due to pt going into PEA cardiac arrest 10/26. Went into PEA cardiac arrest again in ICU. Pt found to have large bil PE. S/p mechanical thrombectomy 10/27. ETT 10/26-10/28. Started CRRT 10/29. PMH: arthritis, DM, GERD, HTN, sleep apnea, BPH status post TURP     Clinical Impressions Pt ambulated with a cane prior to admission with history of multiple falls. He was modified independent in self care. He lived with his wife who can provide minimal physical assistance. Pt presents with impaired cognition, generalized UE weakness and reports neck pain with bed mobility. Pt with difficulty conforming to requirements of sensation testing in trunk and LEs and general strength testing of UEs due to level of alertness and cognition. Pt needs +2 total assist for rolling. Mobility limited to bed level at RN's request (pt to be positioned in reverse trendelenburg only). He can perform B hand to face. He is dependent in all ADLs. Pt will need an extended period of rehabilitation when medically stable. Patient will benefit from intensive inpatient follow-up therapy, >3 hours/day.     If plan is discharge home, recommend the following:   Two people to help with walking and/or transfers;Two people to help with bathing/dressing/bathroom;Assistance with cooking/housework;Assistance with feeding;Direct supervision/assist for medications management;Direct supervision/assist for financial management;Assist for transportation;Help with  stairs or ramp for entrance     Functional Status Assessment   Patient has had a recent decline in their functional status and demonstrates the ability to make significant improvements in function in a reasonable and predictable amount of time.     Equipment Recommendations   Wheelchair (measurements OT);Wheelchair cushion (measurements OT);Hospital bed;Hoyer lift     Recommendations for Other Services         Precautions/Restrictions   Precautions Precautions: Fall;Cervical Precaution Booklet Issued: No Precaution/Restrictions Comments: CRRT, A-line, MAP > 65, cardiac arrest x2 this admission Required Braces or Orthoses: Cervical Brace Cervical Brace: Hard collar;At all times Restrictions Weight Bearing Restrictions Per Provider Order: No     Mobility Bed Mobility Overal bed mobility: Needs Assistance Bed Mobility: Rolling Rolling: Total assist, +2 for safety/equipment, +2 for physical assistance         General bed mobility comments: RN stating pt is to be kept flat  and in reverse Trendelenburg only. Rolled with assist for all aspects. Able to grasp bed rail with hand placed, needs assist to maintain sidelying for linen change and pericare.    Transfers                   General transfer comment: deferred      Balance Overall balance assessment: History of Falls                                         ADL either performed or assessed with clinical judgement   ADL  General ADL Comments: dependent, can bring hands to face, unable     Vision Ability to See in Adequate Light: 0 Adequate Patient Visual Report: No change from baseline Additional Comments: denies wearing glasses or having vision changes     Perception         Praxis         Pertinent Vitals/Pain Pain Assessment Pain Assessment: Faces Faces Pain Scale: Hurts little more Pain Location: neck,  generalized with mobility Pain Descriptors / Indicators: Discomfort, Grimacing, Guarding, Moaning Pain Intervention(s): Monitored during session, Repositioned     Extremity/Trunk Assessment Upper Extremity Assessment Upper Extremity Assessment: Right hand dominant;RUE deficits/detail;LUE deficits/detail RUE Deficits / Details: 2/5 shoulder, 3/5 elbow, 2+/5 gross grasp RUE Coordination: decreased fine motor;decreased gross motor LUE Deficits / Details: 2/5 shoulder with tightness > 90 degrees FF, 3/5 elbow, 2/5  gross grasp LUE Coordination: decreased fine motor;decreased gross motor   Lower Extremity Assessment Lower Extremity Assessment: Defer to PT evaluation RLE Deficits / Details: no clonus or hypertonicity noted, R leg flaccid with no active movement and with WFL PROM throughout; no withdrawal to noxious stimuli, likely no sensation but pt is an unreliable reporter when assessing sensation throughout body LLE Deficits / Details: no clonus or hypertonicity noted, R leg flaccid with no intentional active movement and with WFL PROM at ankle (deferred moving hip and knee per RN request due to CRRT groin line); did dorsiflex ankle in response to deep noxious stimuli but not light noxious stimuli; pt is an unreliable reporter when assessing sensation throughout body   Cervical / Trunk Assessment Cervical / Trunk Assessment: Neck Surgery   Communication Communication Communication: Impaired Factors Affecting Communication: Reduced clarity of speech   Cognition Arousal: Lethargic, Alert Behavior During Therapy: Flat affect (intermittently smiling) Cognition: Cognition impaired   Orientation impairments: Time   Memory impairment (select all impairments): Declarative long-term memory, Short-term memory Attention impairment (select first level of impairment): Focused attention Executive functioning impairment (select all impairments): Sequencing OT - Cognition Comments: slow processing,  difficulty conforming to requirements UE MMT, unclear if pt is an accurate historian or reporter of sensation                 Following commands: Impaired Following commands impaired: Follows one step commands with increased time, Follows one step commands inconsistently     Cueing  General Comments   Cueing Techniques: Verbal cues;Tactile cues  VSS on RA   Exercises     Shoulder Instructions      Home Living Family/patient expects to be discharged to:: Private residence Living Arrangements: Spouse/significant other Available Help at Discharge: Family;Available 24 hours/day (wife uses a cane) Type of Home: House Home Access: Stairs to enter [employer] of Steps: 4   Home Layout: Two level;Bed/bath upstairs;Able to live on main level with bedroom/bathroom;Full bath on main level     Bathroom Shower/Tub: Producer, Television/film/video: Standard     Home Equipment: Cane - single point;BSC/3in1;Shower seat - built in;Grab bars - toilet;Grab bars - tub/shower;Hand held shower head;Wheelchair - manual   Additional Comments: unsure of accuracy of information as pt is unreliable historian and no family present      Prior Functioning/Environment Prior Level of Function : Independent/Modified Independent;Patient poor historian/Family not available;History of Falls (last six months)             Mobility Comments: Used cane, several recent falls      OT Problem List: Decreased strength;Decreased range of motion;Decreased  coordination;Decreased cognition;Impaired UE functional use;Pain   OT Treatment/Interventions: Self-care/ADL training;Therapeutic exercise;DME and/or AE instruction;Therapeutic activities;Cognitive remediation/compensation;Patient/family education;Balance training      OT Goals(Current goals can be found in the care plan section)   Acute Rehab OT Goals OT Goal Formulation: Patient unable to participate in goal setting Time For Goal  Achievement: 08/05/24 Potential to Achieve Goals: Good ADL Goals Pt Will Perform Eating: bed level;sitting;with min assist (drinking) Pt Will Perform Grooming: with min assist;sitting;bed level (wash face) Pt/caregiver will Perform Home Exercise Program: Increased strength;Both right and left upper extremity;Increased ROM;With minimal assist (with family assisting) Additional ADL Goal #1: Pt will activate soft touch call button to request assistance. Additional ADL Goal #2: Pt will roll with moderate assistance for positioning and pericare.   OT Frequency:  Min 2X/week    Co-evaluation PT/OT/SLP Co-Evaluation/Treatment: Yes Reason for Co-Treatment: Complexity of the patient's impairments (multi-system involvement) PT goals addressed during session: Mobility/safety with mobility OT goals addressed during session: ADL's and self-care;Strengthening/ROM      AM-PAC OT 6 Clicks Daily Activity     Outcome Measure Help from another person eating meals?: Total Help from another person taking care of personal grooming?: Total Help from another person toileting, which includes using toliet, bedpan, or urinal?: Total Help from another person bathing (including washing, rinsing, drying)?: Total Help from another person to put on and taking off regular upper body clothing?: Total Help from another person to put on and taking off regular lower body clothing?: Total 6 Click Score: 6   End of Session Nurse Communication: Mobility status;Other (comment) (soft touch ordered)  Activity Tolerance: Patient tolerated treatment well Patient left: in bed;with call bell/phone within reach;with bed alarm set;with nursing/sitter in room  OT Visit Diagnosis: Muscle weakness (generalized) (M62.81);Pain;Other symptoms and signs involving cognitive function                Time: 9169-9142 OT Time Calculation (min): 27 min Charges:  OT General Charges $OT Visit: 1 Visit OT Evaluation $OT Eval High  Complexity: 1 High Mliss HERO, OTR/L Acute Rehabilitation Services Office: [phone]   Kennth Mliss Helling 07/22/2024, 1:24 PM

## 2024-07-22 NOTE — Evaluation (Addendum)
 Physical Therapy Evaluation Patient Details Name: Lawrence [name] MRN: [id number] DOB: [birth year] Today's Date: 07/22/2024  History of Present Illness  Pt is a 78 y.o. male who presented 07/16/24 s/p fall in which he hit his neck and head. MRI showed cord signal change C7-T1 concerning for spinal cord infarct or possibly transverse myelitis. Imaging also showed hairline fracture of the T1 body, mild PLC edema without disruption. S/p aborted partial anterior cervical discectomy C7-T1 due to pt going into PEA cardiac arrest 10/26. Went into PEA cardiac arrest again in ICU. Pt found to have large bil PE. S/p mechanical thrombectomy 10/27. ETT 10/26-10/28. Started CRRT 10/29. PMH: arthritis, DM, GERD, HTN, sleep apnea, BPH status post TURP   Clinical Impression  Pt presents with condition above and deficits mentioned below, see PT Problem List. PTA, he was mod I using a cane, living with his wife in a 2-level house with 4 STE. He reports he can stay on the main level of the house if needed. However, it is unclear how accurate all this information is due to pt's noted cognitive deficits and pt being disoriented to date at this time. Will need to confirm this info with family at a later date. The pt also does not appear to be a reliable reporter in regards to where he can and cannot feel touch throughout his core and legs. He did withdraw to deep noxious stimuli in his L leg, noted by delayed L ankle dorsiflexion movement, but not to light noxious stimuli. He did not withdraw to light or deep noxious stimuli on his R leg. He did not have any clonus or hypertonicity in either leg, displaying flaccidity and WFL PROM otherwise. He is currently displaying deficits in attention, problem-solving, awareness, sequencing, balance, and generalized strength and endurance. He is currently requiring total assist x2 to roll. RN requesting to hold off on elevating HOB or EOB mobility as she was told to keep pt flat with bed in  trendelenburg and HOB locked in certain degree of elevation at this time. As pt has had a drastic functional decline, he could greatly benefit from intensive inpatient rehab, > 3 hours/day. Will continue to follow acutely.       If plan is discharge home, recommend the following: Two people to help with walking and/or transfers;Two people to help with bathing/dressing/bathroom;Assistance with cooking/housework;Direct supervision/assist for medications management;Direct supervision/assist for financial management;Assistance with feeding;Assist for transportation;Help with stairs or ramp for entrance;Supervision due to cognitive status   Can travel by private vehicle        Equipment Recommendations Hospital bed;Wheelchair (measurements PT);Wheelchair cushion (measurements PT);BSC/3in1;Hoyer lift (air mattress; roho cushion; custom w/c; slide board)  Recommendations for Other Services  Rehab consult    Functional Status Assessment Patient has had a recent decline in their functional status and demonstrates the ability to make significant improvements in function in a reasonable and predictable amount of time.     Precautions / Restrictions Precautions Precautions: Fall;Cervical;Other (comment) Precaution Booklet Issued: No Precaution/Restrictions Comments: CRRT, A-line, MAP > 65, cardiac arrest x2 this admission Required Braces or Orthoses: Cervical Brace Cervical Brace: Hard collar;At all times Restrictions Weight Bearing Restrictions Per Provider Order: No      Mobility  Bed Mobility Overal bed mobility: Needs Assistance Bed Mobility: Rolling Rolling: Total assist, +2 for safety/equipment, +2 for physical assistance         General bed mobility comments: Pt cued to grab therapist's hands or rails to roll bil for linen change and to  clean his back with RN. Pt needed total assist to roll bil 1x each. RN requesting to hold off on elevating HOB or EOB mobility as she was told to  keep pt flat with bed in trendelenburg and HOB locked in certain degree of elevation at this time    Transfers                   General transfer comment: RN requesting to hold off on elevating HOB or EOB mobility as she was told to keep pt flat with bed in trendelenburg and HOB locked in certain degree of elevation at this time    Ambulation/Gait               General Gait Details: unable, cannot move legs  Stairs            Wheelchair Mobility     Tilt Bed    Modified Rankin (Stroke Patients Only)       Balance Overall balance assessment: History of Falls (RN requesting to hold off on elevating HOB or EOB mobility as she was told to keep pt flat with bed in trendelenburg and HOB locked in certain degree of elevation at this time)                                           Pertinent Vitals/Pain Pain Assessment Pain Assessment: Faces Faces Pain Scale: Hurts little more Pain Location: neck, generalized with mobility Pain Descriptors / Indicators: Discomfort, Grimacing, Guarding, Moaning Pain Intervention(s): Limited activity within patient's tolerance, Monitored during session, Repositioned    Home Living Family/patient expects to be discharged to:: Private residence Living Arrangements: Spouse/significant other Available Help at Discharge: Family;Available 24 hours/day (wife, but she uses a cane) Type of Home: House Home Access: Stairs to enter   [employer] of Steps: 4   Home Layout: Two level;Bed/bath upstairs;Able to live on main level with bedroom/bathroom;Full bath on main level Home Equipment: Cane - single point;BSC/3in1;Shower seat - built in;Grab bars - toilet;Grab bars - tub/shower;Hand held shower head;Wheelchair - manual Additional Comments: unsure of accuracy of information as pt is unreliable historian and no family present    Prior Function Prior Level of Function : Independent/Modified Independent;Patient  poor historian/Family not available;History of Falls (last six months)             Mobility Comments: Used cane, several recent falls       Extremity/Trunk Assessment   Upper Extremity Assessment Upper Extremity Assessment: Defer to OT evaluation    Lower Extremity Assessment Lower Extremity Assessment: RLE deficits/detail;LLE deficits/detail RLE Deficits / Details: no clonus or hypertonicity noted, R leg flaccid with no active movement and with WFL PROM throughout; no withdrawal to noxious stimuli, likely no sensation but pt is an unreliable reporter when assessing sensation throughout body LLE Deficits / Details: no clonus or hypertonicity noted, R leg flaccid with no intentional active movement and with WFL PROM at ankle (deferred moving hip and knee per RN request due to CRRT groin line); did dorsiflex ankle in response to deep noxious stimuli but not light noxious stimuli; pt is an unreliable reporter when assessing sensation throughout body    Cervical / Trunk Assessment Cervical / Trunk Assessment: Neck Surgery  Communication   Communication Communication: Impaired Factors Affecting Communication: Other (comment) (soft spoken)    Cognition Arousal: Lethargic, Alert Behavior During Therapy:  Flat affect (smiled intermittently)   PT - Cognitive impairments: Orientation, Awareness, Attention, Initiation, Sequencing, Problem solving   Orientation impairments: Time                   PT - Cognition Comments: Pt disoriented to date, stating it was May 31. However, pt knew it was Halloween but continued to state the month was May. Eventually pt did correctly identify the month as October at end of session. Pt with poor comprehension/problem-solving to use call bell or poor motor planning. Pt does fall asleep intermittently during session. Poor attention to task with delayed processing. Following commands: Impaired Following commands impaired: Only follows one step  commands consistently, Follows one step commands with increased time     Cueing Cueing Techniques: Verbal cues, Tactile cues     General Comments General comments (skin integrity, edema, etc.): VSS on RA    Exercises     Assessment/Plan    PT Assessment Patient needs continued PT services  PT Problem List Decreased strength;Decreased activity tolerance;Decreased balance;Decreased mobility;Decreased cognition;Decreased coordination;Decreased knowledge of use of DME;Decreased knowledge of precautions;Cardiopulmonary status limiting activity;Impaired sensation       PT Treatment Interventions DME instruction;Functional mobility training;Therapeutic activities;Therapeutic exercise;Balance training;Cognitive remediation;Neuromuscular re-education;Patient/family education;Wheelchair mobility training    PT Goals (Current goals can be found in the Care Plan section)  Acute Rehab PT Goals Patient Stated Goal: did not state, agreeable to session PT Goal Formulation: With patient Time For Goal Achievement: 08/05/24 Potential to Achieve Goals: Good Additional Goals Additional Goal #1: Pt will be able to tolerate sitting up in recliner >/= 2 hours to progress to being able to tolerate sitting up in a w/c for mobility    Frequency Min 3X/week     Co-evaluation   Reason for Co-Treatment: For patient/therapist safety;Necessary to address cognition/behavior during functional activity;Complexity of the patient's impairments (multi-system involvement);To address functional/ADL transfers PT goals addressed during session: Mobility/safety with mobility         AM-PAC PT 6 Clicks Mobility  Outcome Measure Help needed turning from your back to your side while in a flat bed without using bedrails?: Total Help needed moving from lying on your back to sitting on the side of a flat bed without using bedrails?: Total Help needed moving to and from a bed to a chair (including a wheelchair)?:  Total Help needed standing up from a chair using your arms (e.g., wheelchair or bedside chair)?: Total Help needed to walk in hospital room?: Total Help needed climbing 3-5 steps with a railing? : Total 6 Click Score: 6    End of Session Equipment Utilized During Treatment: Cervical collar Activity Tolerance: Patient tolerated treatment well;Patient limited by lethargy Patient left: in bed;with bed alarm set;with call bell/phone within reach;with SCD's reapplied;with nursing/sitter in room Nurse Communication: Mobility status;Other (comment) (contacted MD for PMR consult) PT Visit Diagnosis: Muscle weakness (generalized) (M62.81);History of falling (Z91.81);Difficulty in walking, not elsewhere classified (R26.2);Other symptoms and signs involving the nervous system (R29.898)    Time: 9169-9144 PT Time Calculation (min) (ACUTE ONLY): 25 min   Charges:   PT Evaluation $PT Eval High Complexity: 1 High   PT General Charges $$ ACUTE PT VISIT: 1 Visit         Theo Ferretti, PT, DPT Acute Rehabilitation Services  Office: [phone]   Theo CHRISTELLA Ferretti 07/22/2024, 10:46 AM

## 2024-07-22 NOTE — Progress Notes (Signed)
 NAME:  Lawrence [name], MRN:  [id number], DOB:  [birth year], LOS: 5 ADMISSION DATE:  07/16/2024, CONSULTATION DATE: 07/17/2024 REFERRING MD:  Debby Carrier , CHIEF COMPLAINT: Status postcardiac arrest  History of Present Illness:  78 year old male with diabetes, hypertension, BPH status post TURP who presented after having a ground-level fall with recent onset of numbness in his lower extremity- complete loss of sensation to BLE & urinary incontinence after fall.      Neurosurgery was consulted, patient mental OR for partial anterior cervical discectomy C7-T1 but course was complicated with multiple time PEA cardiac arrest in OR, procedure was aborted, TEE was done which showed severe RV dysfunction and dilatation with moderate to severe LV dysfunction, patient was transferred to ICU for close monitoring.  In ICU patient blood pressure started dropping and he went into PEA cardiac arrest again, ROSC was achieved after 2 minutes.  Unfortunately due to surgery, it was recommended no TNK/TPA.  Due to tenuous status and concern for re-arrest it was decided to hold on suction thrombectomy at the time.   Pertinent  Medical History   Past Medical History:  Diagnosis Date   Arthritis    Diabetes mellitus without complication (HCC)    GERD (gastroesophageal reflux disease)    Hypertension    Sleep apnea     Significant Hospital Events: Including procedures, antibiotic start and stop dates in addition to other pertinent events   Admitted and PCCM consulted, coded multiple times in OR (PEA) 07/18/2024 mechanical thrombectomy was done for bilateral pulmonary embolism. 10/28 nephro consult 10/29 on 5 NE, plan for HD cath placement and CRRT  10/30 add midodrine   Interim History / Subjective:  Ne 1  1 PRBC for hgb 6.8 K 5.3 phos 1.8, on crrt   Objective    Blood pressure (!) 122/48, pulse 87, temperature 97.9 F (36.6 C), temperature source Axillary, resp. rate 20, height 6' 1 (1.854 m),  weight 84.5 kg, SpO2 94%. CVP:  [3 mmHg-9 mmHg] 7 mmHg      Intake/Output Summary (Last 24 hours) at 07/22/2024 0954 Last data filed at 07/22/2024 0900 Gross per 24 hour  Intake 2581.67 ml  Output 3518.4 ml  Net -936.73 ml   Filed Weights   07/19/24 0430 07/21/24 0430 07/22/24 0425  Weight: 82.3 kg 82.8 kg 84.5 kg    Examination: General: critically ill appearing older adult M  HEENT: C collar in place. Cortrak secure  Neuro: Awakens to voice, but drowsy. 2/5 BUE. 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 Losing to CRRT circuit P -post transfusion H/H  DM w Hyperglycemia -incr EN coverage 10/31 cont SSI and basal   Inadequate PO intake -adv EN per RDN -SLP consult-- waiting for more clarification re restrictions from NSGY   Labs   CBC: Recent Labs  Lab 07/16/24 1959 07/17/24 0425 07/18/24 1616 07/18/24 1707 07/19/24 0155 07/20/24 0513 07/20/24 1129 07/20/24 1725 07/21/24 0416 07/22/24 0334  WBC 12.0*   <  > 16.4*  --  14.0* 12.4*  --   --  10.6* 11.7*  NEUTROABS 10.3*  --   --   --   --   --   --   --   --   --   HGB 14.7   < > 10.5*   < > 9.0* 7.0* 6.6* 7.9* 7.8* 6.8*  HCT 40.6   < > 29.6*   < > 26.3* 20.8* 19.4* 23.3* 22.2* 20.2*  MCV 89.6   < > 89.4  --  92.6 95.9  --   --  91.0 94.4  PLT 253   < > 125*  --  122* 123*  --   --  131* 129*   < > = values in this interval not displayed.    Basic Metabolic Panel: Recent Labs  Lab 07/17/24 2140 07/17/24 2145 07/18/24 0417 07/18/24 0425 07/18/24 1616 07/18/24 1707 07/19/24 2100 07/20/24 0513 07/20/24 1639 07/21/24 0416 07/21/24 1650 07/22/24 0334  NA 138   < > 134*   < > 138   < > 137 137 135 134* 134* 135  K 4.1   < > 3.7   < > 3.2*   < > 4.6 4.7 5.0 4.4 5.1 5.3*  CL 98  --  100  --  103   < > 101 101 100 100 101 100  CO2 19*  --  19*  --  19*   < > 24 24 21* 22 23 24   GLUCOSE 393*  --  372*  --  144*   < > 123* 131* 158* 263* 358* 283*  BUN 35*  --  41*  --  50*   < > 73* 84* 88* 68* 57* 54*  CREATININE 2.37*  --  2.90*  --  3.69*   < > 5.41* 6.10* 5.73* 4.16* 3.03* 2.57*  CALCIUM 9.3  --  8.6*  --  8.3*   < > 7.6* 7.9* 7.4* 7.5* 7.6* 7.7*  MG 2.0  --  1.8  --  1.9  --   --   --   --  2.3  --  2.3  PHOS  --   --   --   --   --   --  6.4*  --  6.3* 4.0  4.2 2.4* 1.8*  1.8*   < > = values in this interval not displayed.   GFR: Estimated Creatinine Clearance: 27.6 mL/min (A) (by C-G formula based on SCr of 2.57 mg/dL (H)). Recent Labs  Lab 07/17/24 1756 07/17/24 2140 07/18/24 0417 07/18/24 1616 07/19/24 0155 07/20/24 0513 07/21/24 0416 07/22/24 0334  WBC  --    < > 16.1* 16.4* 14.0* 12.4* 10.6* 11.7*  LATICACIDVEN 8.6*  --  4.6* 3.2* 1.4  --   --   --    < > = values in this interval not displayed.    Liver Function Tests: Recent Labs  Lab 07/16/24 1959 07/18/24 9582 07/19/24 0155 07/19/24 2100 07/20/24 1639 07/21/24 0416 07/21/24  1650 07/22/24 0334  AST 46* 1,476* 622*  --   --   --   --   --   ALT 20  978* 673*  --   --   --   --   --   ALKPHOS 103 58 50  --   --   --   --   --   BILITOT 0.7 1.3* 2.3*  --   --   --   --   --   PROT 7.3 4.3* 3.9*  --   --   --   --   --   ALBUMIN 4.3 2.1* 1.9* 1.8* 1.9* 2.1* 1.9* 1.8*   Recent Labs  Lab 07/18/24 0417  LIPASE 29   No results for input(s): AMMONIA in the last 168 hours.  ABG    Component Value Date/Time   PHART 7.461 (H) 07/19/2024 0113   PCO2ART 29.2 (L) 07/19/2024 0113   PO2ART 158 (H) 07/19/2024 0113   HCO3 20.8 07/19/2024 0113   TCO2 22 07/19/2024 0113   ACIDBASEDEF 3.0 (H) 07/19/2024 0113   O2SAT 63.1 07/20/2024 1200     Coagulation Profile: Recent Labs  Lab 07/17/24 0425 07/17/24 1451 07/17/24 2140  INR 1.0 1.7* 1.7*    Cardiac Enzymes: Recent Labs  Lab 07/16/24 1959 07/17/24 0425  CKTOTAL 1,321* 938*    HbA1C: Hemoglobin A1C  Date/Time Value Ref Range Status  07/23/2021 12:00 AM 11.9  Final  10/27/2020 12:00 AM 9.3  Final   HbA1c, POC (controlled diabetic range)  Date/Time Value Ref Range Status  11/19/2021 11:44 AM 10.2 (A) 0.0 - 7.0 % Final   HbA1c POC (<> result, manual entry)  Date/Time Value Ref Range Status  03/18/2022 03:21 PM 10.8 4.0 - 5.6 % Final   Hgb A1c MFr Bld  Date/Time Value Ref Range Status  07/17/2024 04:25 AM 12.3 (H) 4.8 - 5.6 % Final    Comment:    (NOTE) Diagnosis of Diabetes The following HbA1c ranges recommended by the American Diabetes Association (ADA) may be used as an aid in the diagnosis of diabetes mellitus.  Hemoglobin             Suggested A1C NGSP%              Diagnosis  <5.7                   Non Diabetic  5.7-6.4                Pre-Diabetic  >6.4                   Diabetic  <7.0                   Glycemic control for                       adults with diabetes.    12/02/2022 11:30 AM 11.9 (H) 4.8 - 5.6 % Final    Comment:    (NOTE)         Prediabetes: 5.7 - 6.4         Diabetes: >6.4         Glycemic control for adults with diabetes:  <7.0     CBG: Recent Labs  Lab 07/21/24 1603 07/21/24 1935 07/21/24 2321 07/22/24 0321 07/22/24 0803  GLUCAP 265* 252* 173* 217* 226*    CRITICAL CARE Performed by: Ronnald FORBES Gave   Total critical care  time: 39 minutes  Critical care time was exclusive of separately billable procedures and treating other patients. Critical care was necessary to treat or prevent imminent or life-threatening deterioration.  Critical care was time spent personally by me on the following activities: development of treatment plan with patient and/or surrogate as well as nursing, discussions with consultants, evaluation of patient's response to treatment, examination of patient, obtaining history from patient or surrogate, ordering and performing treatments and interventions, ordering and review of laboratory studies, ordering and review of radiographic studies, pulse oximetry and re-evaluation of patient's condition.  Ronnald Gave MSN, AGACNP-BC [facility] Amion for pager  07/22/2024, 9:54 AM

## 2024-07-22 NOTE — Progress Notes (Signed)
 Nutrition Follow-up  DOCUMENTATION CODES:   Not applicable  INTERVENTION:  Continue tube feeding via Cortrak:  Vital 1.5 at 55 ml/hr Pro-source TF20 60 mL TID  TF at goal provides 149 g of protein, 2220 kcals and 1008 mL of free water    Continue Renal MVI  Consider additional phosphorus supplementation via enteral route in addition to IV d/t continued losses via CRRT  Recommend modification to bowel regimen if no BM in next 24 hours   NUTRITION DIAGNOSIS:  Inadequate oral intake related to acute illness as evidenced by NPO status.  GOAL:  Patient will meet greater than or equal to 90% of their needs  MONITOR:  Vent status, TF tolerance, Labs, Weight trends, Skin  REASON FOR ASSESSMENT:   Consult Assessment of nutrition requirement/status (CRRT)  ASSESSMENT:   78 yo admitted post fall with onset of numbness in BLE. Neurosurgery consulted, pt taken to OR for partial anterior cervical discectomy but procedure aborted secondary to multiple PEA arrests, intubated. Pt with acute biventricular HF, anuric AKI requiring initiation of CRRT PMH includes DM, HTN, BPH s/p TURP  08-06-24 Admitted, coded multiple times in OR, acute biventricular HF, massive PE, shock 10/27 Mechanical thrombectomy for bilateral PE 10/28 Extubated 10/29 Cortrak, CRRT initiation 10/31 transfused 1 unit PRBCs   C-Collar remains in place. Neurosurgery still following with plan to continue to assess candidacy for completion of 7-T1 ACDF. Insensate below trunk, no movement in legs.  Weaning Levophed, continues on heparin  gtt.   CRRT continues. Noted low phosphorus. Being repleted today. Would recommend considering addition of enteral phosphorus supplementation in addition to IV, given his >50% decline in levels over last 24 hours since initiation of CRRT with ongoing losses.    Remains NPO. Cortrak in place and TF infusing at goal rate. Bowels have moved once since admission (5 days), however small in amount  with TF at goal rate. NP escalated bowel regimen yesterday to BID Miralax with dulcolax suppository in addition to senna already ordered. RN reports no movement today s/p suppository and Miralax. Recommend modification if he does not go in next 24 hours. Discussed with NP.   Hgb 6.8, receiving PRBC. K trending up. Dialysate bath modified to 2K, per nephrology. CVP 5, with nephrology also increasing UF goal from 0-17ml/hr to 0-157ml/hr.   He was eating well PTA. Pt reports UBW around 180 pounds; current wt 84.5 kg, admission wt 79.5 kg. Net positive 5.6L since admission.   CBGs 283-358 (140-180); was noted only on sliding scale insulin . Coverage has been increased given he has achieved goal rate and with poor blood sugar control at baseline. Now on sliding scale and long-acting.   Drains/Lines: Cortrak (gastric) R internal jugular: CVC, double lumen L radial: A-line L femoral: HDC, triple lumen Foley cather UOP: 38 ml x24 hours   Medications: dulcolax suppository, SS Novolog , Semglee, renal MVI, Miralax BID, senna-docusate Drips: IV ABX Levo @ 0.9  Labs:   Na+ 135 (wdl) K+ 5.3 (H) PHOS 1.8 (L) Mg 2.3 (wdl) BUN 54 Crt 2.57  CBGs 283-358 x24 hours A1c 12.3 (06/2024)   Diet Order:   Diet Order             Diet NPO time specified Except for: Ice Chips, Sips with Meds  Diet effective now                  EDUCATION NEEDS:   Not appropriate for education at this time  Skin:  Skin Assessment: Reviewed RN Assessment  Last BM:  10/30 - type 6/7 x1 (small)  Height:  Ht Readings from Last 1 Encounters:  07/17/24 6' 1 (1.854 m)   Weight:  Wt Readings from Last 1 Encounters:  07/22/24 84.5 kg   Ideal Body Weight:  83.6 kg  BMI:  Body mass index is 24.58 kg/m.  Estimated Nutritional Needs:   Kcal:  2100-2300 kcals  Protein:  145-165 g  Fluid:  1.5 L  Lawrence Deaner MS, RD, LDN Registered Dietitian Clinical Nutrition RD Inpatient Contact Info in Amion

## 2024-07-22 NOTE — Progress Notes (Signed)
 Lawrence [name] is an 78 y.o. male diabetes, hypertension, BPH status post TURP initially presenting after a ground-level fall about 5 days prior to admission. Of note over the past few weeks he has had neuropathy in his feet, felt more unsteady and used a cane because of his neuropathy.  Unfortunately he slipped and fell and hit his head on the sink followed by new onset numbness in his legs, inability to sit up and loss of function in his legs as well as urinary incontinence.  He was to have a partial anterior cervical discectomy C7-T1 to stabilize even though he was not likely to regain strength in his lower extremities but during the operative course he developed PEA arrest x 2 with subsequent CT scan showing a massive bilateral PE with poor lung perfusion.  Patient also had successful thrombectomy performed by VIR removing a large clot burden at the right PA bifurcation as well as a large clot burden in the left lower lung lobe. BL cr is approximately 0.9-1.1 (12/02/22) but with his complicated course he has developed renal failure with very poor urine output.  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 - He does not want long-term dialysis but is willing to give a trial with short-term dialysis to give his kidneys a chance to recover -Still anuric with K trending up --> started CRRT on 10/29 mid afternoon. CVP only 4-5 tolerating UF 24ml/hr, no issues with filter clotting. + 5.7 L during hospitalization. Likely will be a prolonged course given significant renal injury, hopefully no cortical necrosis.  Change to 2K dialysate bath (K trending up); will increase UF goal 0-100 (currently on 50 and tolerating). Need to monitor carefully as pt was very sensitive to decrease Levophed the other day.  Replete Phos.  -Monitor Daily I/Os, Daily weight  -Maintain MAP>65 for optimal renal  perfusion.  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 Acute respiratory failure -off sedation and alert, and possible extubation trial pending weaning parameters Cardiogenic shock secondary to pulmonary embolus -off epinephrine, still on Levophed by cardiology  Subjective: Denies fever, chills, nausea, SOB; CVP 5   Chemistry and CBC: Creatinine  Date/Time Value Ref Range Status  02/21/2021 12:00 AM 1.2 0.6 - 1.3 Final  10/27/2020 12:00 AM 0.9 0.6 - 1.3 Final   Creatinine, Ser  Date/Time Value Ref Range Status  07/22/2024 03:34 AM 2.57 (H) 0.61 - 1.24 mg/dL Final  89/69/7974 95:49 PM 3.03 (H) 0.61 - 1.24 mg/dL Final  89/69/7974 95:83 AM 4.16 (H) 0.61 - 1.24 mg/dL Final  89/70/7974 95:60 PM 5.73 (H) 0.61 - 1.24 mg/dL Final  89/70/7974 94:86 AM 6.10 (H) 0.61 - 1.24 mg/dL Final  89/71/7974 90:99 PM 5.41 (H) 0.61 - 1.24 mg/dL Final  89/71/7974 97:46 PM 5.16 (H) 0.61 - 1.24 mg/dL Final  89/71/7974 98:44 AM 4.18 (H) 0.61 - 1.24 mg/dL Final  89/72/7974 95:83 PM 3.69 (H) 0.61 - 1.24 mg/dL Final  89/72/7974 95:82 AM 2.90 (H) 0.61 - 1.24 mg/dL Final  89/73/7974 90:59 PM 2.37 (H) 0.61 - 1.24 mg/dL Final  89/73/7974 97:48 PM 1.50 (H) 0.61 - 1.24 mg/dL Final  07/17/2024 04:25 AM 0.99 0.61 - 1.24 mg/dL Final  89/74/7974 92:40 PM 1.08 0.61 - 1.24 mg/dL Final  96/87/7975 89:41 AM 1.02 0.61 - 1.24 mg/dL Final  97/79/7976 98:95 PM 0.92 0.61 - 1.24 mg/dL Final  95/96/7977 93:65 PM 0.86 0.61 - 1.24 mg/dL Final   Recent Labs  Lab 07/17/24 0425 07/17/24 1352 07/19/24 1453 07/19/24 2100 07/20/24 0513 07/20/24 1639 07/21/24 0416 07/21/24 1650 07/22/24 0334  NA 135   < > 135 137 137 135 134* 134* 135  K 3.6   < >  4.4 4.6 4.7 5.0 4.4 5.1 5.3*  CL 98   < > 101 101 101 100 100 101 100  CO2 24   < > 24 24 24  21* 22 23 24   GLUCOSE 180*   < > 131* 123* 131* 158* 263* 358* 283*  BUN 22   < > 69* 73* 84* 88* 68* 57* 54*  CREATININE 0.99   < > 5.16* 5.41* 6.10* 5.73* 4.16* 3.03* 2.57*  CALCIUM 9.0   < > 7.7* 7.6* 7.9* 7.4* 7.5* 7.6* 7.7*  PHOS 3.4  --   --  6.4*  --  6.3* 4.0  4.2 2.4* 1.8*  1.8*   < > = values in this interval not displayed.   Recent Labs  Lab 07/16/24 1959 07/17/24 0425 07/19/24 0155 07/20/24 0513 07/20/24 1129 07/20/24 1725 07/21/24 0416 07/22/24 0334  WBC 12.0*   < > 14.0* 12.4*  --   --  10.6* 11.7*  NEUTROABS 10.3*  --   --   --   --   --   --   --   HGB 14.7   < > 9.0* 7.0* 6.6* 7.9* 7.8* 6.8*  HCT 40.6   < > 26.3* 20.8* 19.4* 23.3* 22.2* 20.2*  MCV 89.6   < > 92.6 95.9  --   --  91.0 94.4  PLT 253   < > 122* 123*  --   --  131* 129*   < > = values in this interval not displayed.   Liver Function Tests: Recent Labs  Lab 07/16/24 1959 07/18/24 0417 07/19/24 0155 07/19/24 2100 07/21/24 0416 07/21/24 1650 07/22/24 0334  AST 46* 1,476* 622*  --   --   --   --   ALT 20 978* 673*  --   --   --   --   ALKPHOS 103 58 50  --   --   --   --   BILITOT 0.7 1.3* 2.3*  --   --   --   --   PROT 7.3 4.3* 3.9*  --   --   --   --   ALBUMIN 4.3 2.1* 1.9*   < > 2.1* 1.9* 1.8*   < > = values in this interval not displayed.   Recent Labs  Lab 07/18/24 0417  LIPASE 29   No results for input(s): AMMONIA in the last 168 hours. Cardiac Enzymes: Recent Labs  Lab 07/16/24 1959 07/17/24 0425  CKTOTAL 1,321* 938*   Iron Studies: No results for input(s): IRON, TIBC, TRANSFERRIN, FERRITIN in the last 72 hours. PT/INR: @LABRCNTIP (inr:5)  Xrays/Other Studies: ) Results for orders placed or performed during the hospital encounter of 07/16/24 (from the past 48 hours)  Hemoglobin and hematocrit, blood     Status: Abnormal   Collection Time: 07/20/24 11:29 AM  Result  Value Ref Range   Hemoglobin 6.6 (LL) 13.0 - 17.0 g/dL    Comment: This critical result  has been called to ALMARIE BEAL RN by Gabriela Sorto on 07/20/2024 12:54:26, and has been read back. REPEATED TO VERIFY CORRECTED ON 10/29 AT 2159: PREVIOUSLY REPORTED AS 6.6 This critical result has been called to ALMARIE POUR. RN by Charolet Simmer on 07/20/2024 12:54:26, and has been read back.    HCT 19.4 (L) 39.0 - 52.0 %    Comment: Performed at [facility], [address] [place], [place] [address]  Cooxemetry Panel (carboxy, met, total hgb, O2 sat)     Status: Abnormal   Collection Time: 07/20/24 12:00 PM  Result Value Ref Range   Total hemoglobin <6.9 (LL) 12.0 - 16.0 g/dL    Comment: CRITICAL RESULT CALLED TO, READ BACK BY AND VERIFIED WITH: KEARNES,D RN 1241 07/20/24 AMIREHSANIF    O2 Saturation 63.1 %   Carboxyhemoglobin 1.8 (H) 0.5 - 1.5 %   Methemoglobin <0.7 0.0 - 1.5 %    Comment: Performed at [facility], [address] [place], [place] [address]  Glucose, capillary     Status: Abnormal   Collection Time: 07/20/24 12:07 PM  Result Value Ref Range   Glucose-Capillary 147 (H) 70 - 99 mg/dL    Comment: Glucose reference range applies only to samples taken after fasting for at least 8 hours.  Prepare RBC (crossmatch)     Status: None   Collection Time: 07/20/24 12:44 PM  Result Value Ref Range   Order Confirmation      ORDER PROCESSED BY BLOOD BANK Performed at [facility], [address], [place], [place] [address]   Renal function panel (daily at 1600)     Status: Abnormal   Collection Time: 07/20/24  4:39 PM  Result Value Ref Range   Sodium 135 135 - 145 mmol/L   Potassium 5.0 3.5 - 5.1 mmol/L   Chloride 100 98 - 111 mmol/L   CO2 21 (L) 22 - 32 mmol/L   Glucose, Bld 158 (H) 70 - 99 mg/dL    Comment: Glucose reference range applies only to samples taken after fasting for at least 8 hours.   BUN 88 (H) 8 - 23 mg/dL   Creatinine, Ser 4.26 (H) 0.61 - 1.24  mg/dL   Calcium 7.4 (L) 8.9 - 10.3 mg/dL   Phosphorus 6.3 (H) 2.5 - 4.6 mg/dL   Albumin 1.9 (L) 3.5 - 5.0 g/dL   GFR, Estimated 10 (L) >60 mL/min    Comment: (NOTE) Calculated using the CKD-EPI Creatinine Equation (2021)    Anion gap 14 5 - 15    Comment: Performed at [facility], [address], [place], [place] [address]  Glucose, capillary     Status: Abnormal   Collection Time: 07/20/24  4:56 PM  Result Value Ref Range   Glucose-Capillary 156 (H) 70 - 99 mg/dL    Comment: Glucose reference range applies only to samples taken after fasting for at least 8 hours.  Hemoglobin and hematocrit, blood     Status: Abnormal   Collection Time: 07/20/24  5:25 PM  Result Value Ref Range   Hemoglobin 7.9 (L) 13.0 - 17.0 g/dL   HCT 76.6 (L) 60.9 - 47.9 %    Comment: Performed at [facility], [address], [place], [place] [address]  Heparin  level (unfractionated)     Status: None   Collection Time: 07/20/24  6:49 PM  Result Value Ref Range   Heparin  Unfractionated 0.34 0.30 - 0.70 IU/mL    Comment: (  NOTE) The clinical reportable range upper limit is being lowered to >1.10 to align with the FDA approved guidance for the current laboratory assay.  If heparin  results are below expected values, and patient dosage has  been confirmed, suggest follow up testing of antithrombin III levels. Performed at [facility], [address], [place], [place] [address]   Glucose, capillary     Status: Abnormal   Collection Time: 07/20/24  7:39 PM  Result Value Ref Range   Glucose-Capillary 167 (H) 70 - 99 mg/dL    Comment: Glucose reference range applies only to samples taken after fasting for at least 8 hours.  Glucose, capillary     Status: Abnormal   Collection Time: 07/20/24 11:12 PM  Result Value Ref Range   Glucose-Capillary 196 (H) 70 - 99 mg/dL    Comment: Glucose reference range applies only to samples taken after fasting for at least 8 hours.  Glucose, capillary      Status: Abnormal   Collection Time: 07/21/24  3:11 AM  Result Value Ref Range   Glucose-Capillary 255 (H) 70 - 99 mg/dL    Comment: Glucose reference range applies only to samples taken after fasting for at least 8 hours.  CBC     Status: Abnormal   Collection Time: 07/21/24  4:16 AM  Result Value Ref Range   WBC 10.6 (H) 4.0 - 10.5 K/uL   RBC 2.44 (L) 4.22 - 5.81 MIL/uL   Hemoglobin 7.8 (L) 13.0 - 17.0 g/dL   HCT 77.7 (L) 60.9 - 47.9 %   MCV 91.0 80.0 - 100.0 fL   MCH 32.0 26.0 - 34.0 pg   MCHC 35.1 30.0 - 36.0 g/dL   RDW 83.8 (H) 88.4 - 84.4 %   Platelets 131 (L) 150 - 400 K/uL   nRBC 0.9 (H) 0.0 - 0.2 %    Comment: Performed at [facility], [address] [place], [place] [address]  Heparin  level (unfractionated)     Status: Abnormal   Collection Time: 07/21/24  4:16 AM  Result Value Ref Range   Heparin  Unfractionated 0.28 (L) 0.30 - 0.70 IU/mL    Comment: (NOTE) The clinical reportable range upper limit is being lowered to >1.10 to align with the FDA approved guidance for the current laboratory assay.  If heparin  results are below expected values, and patient dosage has  been confirmed, suggest follow up testing of antithrombin III levels. Performed at [facility], [address], [place], [place] [address]   Renal function panel (daily at 0500)     Status: Abnormal   Collection Time: 07/21/24  4:16 AM  Result Value Ref Range   Sodium 134 (L) 135 - 145 mmol/L   Potassium 4.4 3.5 - 5.1 mmol/L   Chloride 100 98 - 111 mmol/L   CO2 22 22 - 32 mmol/L   Glucose, Bld 263 (H) 70 - 99 mg/dL    Comment: Glucose reference range applies only to samples taken after fasting for at least 8 hours.   BUN 68 (H) 8 - 23 mg/dL   Creatinine, Ser 5.83 (H) 0.61 - 1.24 mg/dL   Calcium 7.5 (L) 8.9 - 10.3 mg/dL   Phosphorus 4.2 2.5 - 4.6 mg/dL   Albumin 2.1 (L) 3.5 - 5.0 g/dL   GFR, Estimated 14 (L) >60 mL/min    Comment: (NOTE) Calculated using the CKD-EPI Creatinine  Equation (2021)    Anion gap 12 5 - 15  Comment: Performed at [facility], [address] [place], [place] [address]  Magnesium     Status: None   Collection Time: 07/21/24  4:16 AM  Result Value Ref Range   Magnesium 2.3 1.7 - 2.4 mg/dL    Comment: Performed at [facility], [address], [place], [place] [address]  Phosphorus     Status: None   Collection Time: 07/21/24  4:16 AM  Result Value Ref Range   Phosphorus 4.0 2.5 - 4.6 mg/dL    Comment: Performed at [facility], [address] [place], [place] [address]  Glucose, capillary     Status: Abnormal   Collection Time: 07/21/24  7:35 AM  Result Value Ref Range   Glucose-Capillary 312 (H) 70 - 99 mg/dL    Comment: Glucose reference range applies only to samples taken after fasting for at least 8 hours.  Glucose, capillary     Status: Abnormal   Collection Time: 07/21/24  7:41 AM  Result Value Ref Range   Glucose-Capillary 262 (H) 70 - 99 mg/dL    Comment: Glucose reference range applies only to samples taken after fasting for at least 8 hours.  Glucose, capillary     Status: Abnormal   Collection Time: 07/21/24 10:55 AM  Result Value Ref Range   Glucose-Capillary 279 (H) 70 - 99 mg/dL    Comment: Glucose reference range applies only to samples taken after fasting for at least 8 hours.  Glucose, capillary     Status: Abnormal   Collection Time: 07/21/24  4:03 PM  Result Value Ref Range   Glucose-Capillary 265 (H) 70 - 99 mg/dL    Comment: Glucose reference range applies only to samples taken after fasting for at least 8 hours.  Renal function panel (daily at 1600)     Status: Abnormal   Collection Time: 07/21/24  4:50 PM  Result Value Ref Range   Sodium 134 (L) 135 - 145 mmol/L   Potassium 5.1 3.5 - 5.1 mmol/L   Chloride 101 98 - 111 mmol/L   CO2 23 22 - 32 mmol/L   Glucose, Bld 358 (H) 70 - 99 mg/dL    Comment: Glucose reference range applies only to samples taken after fasting for at least 8  hours.   BUN 57 (H) 8 - 23 mg/dL   Creatinine, Ser 6.96 (H) 0.61 - 1.24 mg/dL   Calcium 7.6 (L) 8.9 - 10.3 mg/dL   Phosphorus 2.4 (L) 2.5 - 4.6 mg/dL   Albumin 1.9 (L) 3.5 - 5.0 g/dL   GFR, Estimated 21 (L) >60 mL/min    Comment: (NOTE) Calculated using the CKD-EPI Creatinine Equation (2021)    Anion gap 10 5 - 15    Comment: Performed at [facility], [address], [place], [place] [address]  Glucose, capillary     Status: Abnormal   Collection Time: 07/21/24  7:35 PM  Result Value Ref Range   Glucose-Capillary 252 (H) 70 - 99 mg/dL    Comment: Glucose reference range applies only to samples taken after fasting for at least 8 hours.  Heparin  level (unfractionated)     Status: None   Collection Time: 07/21/24  7:49 PM  Result Value Ref Range   Heparin  Unfractionated 0.34 0.30 - 0.70 IU/mL    Comment: (NOTE) The clinical reportable range upper limit is being lowered to >1.10 to align with the FDA approved guidance for the current laboratory assay.  If heparin  results  are below expected values, and patient dosage has  been confirmed, suggest follow up testing of antithrombin III levels. Performed at [facility], [address], [place], [place] [address]   Glucose, capillary     Status: Abnormal   Collection Time: 07/21/24 11:21 PM  Result Value Ref Range   Glucose-Capillary 173 (H) 70 - 99 mg/dL    Comment: Glucose reference range applies only to samples taken after fasting for at least 8 hours.  Glucose, capillary     Status: Abnormal   Collection Time: 07/22/24  3:21 AM  Result Value Ref Range   Glucose-Capillary 217 (H) 70 - 99 mg/dL    Comment: Glucose reference range applies only to samples taken after fasting for at least 8 hours.  CBC     Status: Abnormal   Collection Time: 07/22/24  3:34 AM  Result Value Ref Range   WBC 11.7 (H) 4.0 - 10.5 K/uL   RBC 2.14 (L) 4.22 - 5.81 MIL/uL   Hemoglobin 6.8 (LL) 13.0 - 17.0 g/dL    Comment: REPEATED TO  VERIFY This critical result has been called to M. PRITCHARD, RN by [facility] on 07/22/2024 03:56:25, and has been read back.    HCT 20.2 (L) 39.0 - 52.0 %   MCV 94.4 80.0 - 100.0 fL   MCH 31.8 26.0 - 34.0 pg   MCHC 33.7 30.0 - 36.0 g/dL   RDW 84.0 (H) 88.4 - 84.4 %   Platelets 129 (L) 150 - 400 K/uL    Comment: REPEATED TO VERIFY   nRBC 1.6 (H) 0.0 - 0.2 %    Comment: Performed at [facility], [address] [place], [place] [address]  Heparin  level (unfractionated)     Status: Abnormal   Collection Time: 07/22/24  3:34 AM  Result Value Ref Range   Heparin  Unfractionated 0.29 (L) 0.30 - 0.70 IU/mL    Comment: (NOTE) The clinical reportable range upper limit is being lowered to >1.10 to align with the FDA approved guidance for the current laboratory assay.  If heparin  results are below expected values, and patient dosage has  been confirmed, suggest follow up testing of antithrombin III levels. Performed at [facility], [address] [place], [place] [address]   Renal function panel (daily at 0500)     Status: Abnormal   Collection Time: 07/22/24  3:34 AM  Result Value Ref Range   Sodium 135 135 - 145 mmol/L   Potassium 5.3 (H) 3.5 - 5.1 mmol/L   Chloride 100 98 - 111 mmol/L   CO2 24 22 - 32 mmol/L   Glucose, Bld 283 (H) 70 - 99 mg/dL    Comment: Glucose reference range applies only to samples taken after fasting for at least 8 hours.   BUN 54 (H) 8 - 23 mg/dL   Creatinine, Ser 7.42 (H) 0.61 - 1.24 mg/dL   Calcium 7.7 (L) 8.9 - 10.3 mg/dL   Phosphorus 1.8 (L) 2.5 - 4.6 mg/dL   Albumin 1.8 (L) 3.5 - 5.0 g/dL   GFR, Estimated 25 (L) >60 mL/min    Comment: (NOTE) Calculated using the CKD-EPI Creatinine Equation (2021)    Anion gap 11 5 - 15    Comment: Performed at [facility], [address], [place], [place] [address]  Magnesium     Status: None   Collection Time: 07/22/24  3:34 AM  Result Value Ref Range   Magnesium 2.3 1.7 -  2.4 mg/dL     Comment: Performed at [facility], [address], [place], [place] [address]  Phosphorus     Status: Abnormal   Collection Time: 07/22/24  3:34 AM  Result Value Ref Range   Phosphorus 1.8 (L) 2.5 - 4.6 mg/dL    Comment: Performed at [facility], [address], [place], [place] [address]  Type and screen [facility]     Status: None (Preliminary result)   Collection Time: 07/22/24  4:49 AM  Result Value Ref Range   ABO/RH(D) O POS    Antibody Screen NEG    Sample Expiration 07/25/2024,2359    Unit Number [id number]    Blood Component Type RED CELLS,LR    Unit division 00    Status of Unit ISSUED    Transfusion Status OK TO TRANSFUSE    Crossmatch Result      Compatible Performed at [facility], [address] [place], [place] [address]   Prepare RBC (crossmatch)     Status: None   Collection Time: 07/22/24  4:50 AM  Result Value Ref Range   Order Confirmation      ORDER PROCESSED BY BLOOD BANK Performed at [facility], [address], [place], [place] [address]   Glucose, capillary     Status: Abnormal   Collection Time: 07/22/24  8:03 AM  Result Value Ref Range   Glucose-Capillary 226 (H) 70 - 99 mg/dL    Comment: Glucose reference range applies only to samples taken after fasting for at least 8 hours.   No results found.   PMH:   Past Medical History:  Diagnosis Date   Arthritis    Diabetes mellitus without complication (HCC)    GERD (gastroesophageal reflux disease)    Hypertension    Sleep apnea     PSH:   Past Surgical History:  Procedure Laterality Date   ANTERIOR CERVICAL DECOMP/DISCECTOMY FUSION N/A 07/17/2024   Procedure: ANTERIOR CERVICAL DECOMPRESSION/DISCECTOMY FUSION, CERVICAL SEVEN - THORACIC ONE;  Surgeon: Debby Dorn MATSU, MD;  Location: [facility] OR;  Service: Neurosurgery;  Laterality: N/A;  Partial Discecomy Cervical seven- Thoracic one   APPENDECTOMY     78 yo   BACK SURGERY  1975   lumbar  diskectomy   done [facility]   COLONOSCOPY N/A 02/21/2015   Procedure: COLONOSCOPY;  Surgeon: Claudis RAYMOND Rivet, MD;  Location: [facility] ENDO SUITE;  Service: Endoscopy;  Laterality: N/A;  1025   COLONOSCOPY  11/13/2021   COLONOSCOPY WITH PROPOFOL  N/A 11/13/2021   Procedure: COLONOSCOPY WITH PROPOFOL ;  Surgeon: Rivet Claudis RAYMOND, MD;  Location: [facility] ENDO SUITE;  Service: Endoscopy;  Laterality: N/A;  155   CYSTOSCOPY WITH URETHRAL DILATATION N/A 12/09/2022   Procedure: CYSTOSCOPY WITH URETHRAL DILATATION;  Surgeon: Watt Rush, MD;  Location: [facility] ORS;  Service: Urology;  Laterality: N/A;   EYE SURGERY Bilateral    Lasik surgery   IR ANGIOGRAM PULMONARY BILATERAL SELECTIVE  07/18/2024   IR ANGIOGRAM SELECTIVE EACH ADDITIONAL VESSEL  07/18/2024   IR THROMBECT PRIM MECH ADD (INCLU) MOD SED  07/18/2024   IR THROMBECT PRIM MECH ADD (INCLU) MOD SED  07/18/2024   IR THROMBECT PRIM MECH ADD (INCLU) MOD SED  07/18/2024   LEFT HEART CATH AND CORONARY ANGIOGRAPHY N/A 12/23/2020   Procedure: LEFT HEART CATH AND CORONARY ANGIOGRAPHY;  Surgeon: Burnard Debby LABOR, MD;  Location: [facility] INVASIVE CV LAB;  Service: Cardiovascular;  Laterality: N/A;   NECK  SURGERY  1999   Work injury, posterior fusion   POLYPECTOMY  11/13/2021   Procedure: POLYPECTOMY INTESTINAL;  Surgeon: Golda Claudis PENNER, MD;  Location: [facility] ENDO SU[facility];  Service: Endoscopy;;   SHOULDER SURGERY Bilateral 2000   rotoator cuff repair   done at [facility]   TONSILLECTOMY     TRANSURETHRAL INCISION OF PROSTATE N/A 12/09/2022   Procedure: TRANSURETHRAL RESECTION OF PROSTATE;  Surgeon: Watt Rush, MD;  Location: [facility] ORS;  Service: Urology;  Laterality: N/A;  1 HR FOR CASE    Allergies:  Allergies  Allergen Reactions   Penicillins Hives   Lucentis [Ranibizumab] Rash    Medications:   Prior to Admission medications   Medication Sig Start Date End Date Taking? Authorizing Provider  Apple Cider Vinegar 600 MG CAPS Take 600 mg by mouth daily.   Yes  [provider]  aspirin  EC 81 MG tablet Take 1 tablet (81 mg total) by mouth every other day. 11/14/21  Yes Rehman, Claudis PENNER, MD  Barberry-Oreg Grape-Goldenseal (BERBERINE COMPLEX PO) Take 1 capsule by mouth daily.   Yes [provider]  cholecalciferol (VITAMIN D3) 25 MCG (1000 UNIT) tablet Take 1,000 Units by mouth daily.   Yes [provider]  Cinnamon 500 MG capsule Take 500 mg by mouth daily.   Yes [provider]  diphenhydrAMINE HCl (NERVINE PO) Take 1 tablet by mouth daily. Patient states that he takes one a day   Yes [provider]  empagliflozin (JARDIANCE) 10 MG TABS tablet Take 10 mg by mouth daily.   Yes [provider]  ferrous sulfate 325 (65 FE) MG tablet Take 325 mg by mouth daily with breakfast.   Yes [provider]  hydrochlorothiazide  (HYDRODIURIL ) 25 MG tablet Take 25 mg by mouth daily. 07/23/21  Yes [provider]  insulin  degludec (TRESIBA  FLEXTOUCH) 100 UNIT/ML FlexTouch Pen Inject 12 Units into the skin at bedtime. Patient taking differently: Inject 28 Units into the skin 2 (two) times daily. 10/07/22  Yes Reardon, Benton PARAS, NP  Multiple Vitamins-Minerals (OCUVITE ADULT 50+ PO) Take 1 tablet by mouth daily.   Yes [provider]  OVER THE COUNTER MEDICATION Take 1 capsule by mouth daily. Prostagenix   Yes [provider]  OVER THE COUNTER MEDICATION Take 1 capsule by mouth daily. Diabacore (Diabetic Supplement)   Yes [provider]  OVER THE COUNTER MEDICATION Take 1 tablet by mouth in the morning and at bedtime. Glucoceil   Yes [provider]  tadalafil  (CIALIS ) 5 MG tablet Take 1 tablet (5 mg total) by mouth daily as needed for erectile dysfunction. 12/06/20  Yes Watt Rush, MD  vitamin E 180 MG (400 UNITS) capsule Take 400 Units by mouth daily.   Yes [provider]  Vitamin Mixture (ESTER-C PO) Take 1,000 mg by mouth daily.   Yes [provider]    Discontinued Meds:   Medications Discontinued During This Encounter  Medication Reason   0.9 %  sodium chloride  infusion    lactated ringers  infusion Patient Transfer   insulin  aspart (novoLOG ) injection 0-7 Units Patient Transfer   lidocaine -EPINEPHrine (XYLOCAINE  W/EPI) 1 %-1:100000 (with pres) injection Patient Transfer   0.9 % irrigation (POUR BTL) Patient Transfer   Surgifoam 1 Gm with Thrombin 5,000 units (5 ml) topical solution Patient Transfer   HYDROmorphone  (DILAUDID ) injection 0.25-0.5 mg Patient Transfer   droperidol (INAPSINE) 2.5 MG/ML injection 0.625 mg Patient Transfer   insulin  glargine-yfgn (SEMGLEE) injection 28 Units  midazolam  (VERSED ) 2 MG/2ML injection Returned to ADS   Thrombi-Pad 3X3 pad 1 each    insulin  aspart (novoLOG ) injection 0-6 Units    insulin  glargine-yfgn (SEMGLEE) injection 20 Units    phenylephrine (NEO-SYNEPHRINE) 20mg /NS 250mL premix infusion    heparin  ADULT infusion 100 units/mL (25000 units/250mL)    fentaNYL  (SUBLIMAZE ) injection 25-50 mcg    midazolam  PF (VERSED ) injection 4 mg    norepinephrine (LEVOPHED) 4mg  in 250mL (0.016 mg/mL) premix infusion    heparin  ADULT infusion 100 units/mL (25000 units/250mL)    vasopressin (PITRESSIN) 20 Units in 100 mL (0.2 unit/mL) infusion-*FOR SHOCK*    dexmedetomidine (PRECEDEX) 400 MCG/100ML (4 mcg/mL) infusion    midazolam  (VERSED ) 100 mg/100 mL (1 mg/mL) premix infusion    calcium chloride injection 1 g    Oral care mouth rinse    lactated ringers  infusion    fentaNYL  in NS 250mL (87mcg/ml) infusion-PREMIX    fentaNYL  (SUBLIMAZE ) bolus via infusion 25-100 mcg    EPINEPHrine (ADRENALIN) 5 mg in NS 250 mL (0.02 mg/mL) premix infusion    midazolam  (VERSED ) bolus via infusion 2 mg    insulin  regular, human (MYXREDLIN) 100 units/ 100 mL infusion    dextrose  50 % solution 0-50 mL    propofol  (DIPRIVAN ) 1000 MG/100ML infusion    lidocaine  (XYLOCAINE ) 1 % (with pres) injection 20 mL     glycopyrrolate (ROBINUL) injection 0.1 mg    heparin  injection 1,000-6,000 Units    ceFEPIme (MAXIPIME) 2 g in sodium chloride  0.9 % 100 mL IVPB    ceFEPIme (MAXIPIME) 2 g in sodium chloride  0.9 % 100 mL IVPB    feeding supplement (PROSource TF20) liquid 60 mL    insulin  aspart (novoLOG ) injection 0-9 Units    insulin  aspart (novoLOG ) injection 2 Units    midodrine (PROAMATINE) tablet 5 mg    midodrine (PROAMATINE) tablet 5 mg    pantoprazole  (PROTONIX ) injection 40 mg    polyethylene glycol (MIRALAX / GLYCOLAX) packet 17 g    insulin  aspart (novoLOG ) injection 4 Units     Social History:  reports that he has never smoked. He has never used smokeless tobacco. He reports that he does not drink alcohol and does not use drugs.  Family History:  History reviewed. No pertinent family history.  Blood pressure (!) 115/41, pulse 79, temperature 97.9 F (36.6 C), temperature source Axillary, resp. rate 19, height 6' 1 (1.854 m), weight 84.5 kg, SpO2 95%. Physical Exam: General appearance: alert, cooperative, and appears stated age Head: NCAT Neck: no adenopathy, brace Resp: CTA b/l Cardio: regular rate and rhythm GI: SNDNT Extremities: tr edema Pulses: 2+ and symmetric     Samarah Hogle, [facility]WO[facility] ORN, MD 07/22/2024, 10:13 AM

## 2024-07-22 NOTE — Progress Notes (Signed)
 SLP Cancellation Note  Patient Details Name: Lawrence [name] MRN: [id number] DOB: 1945-12-12   Cancelled swallow evaluation: C/M - spoke with RN. Pt has HOB restrictions, weak cough/phonation and AMS.  Will follow for readiness for swallow assessment.  Has cortrak.  Lawrence [name] L. Vona, MA CCC/SLP Clinical Specialist - Acute Care SLP Acute Rehabilitation Services Office number [phone]          Lawrence [name] 07/22/2024, 9:25 AM

## 2024-07-22 NOTE — Progress Notes (Signed)
 PHARMACY - ANTICOAGULATION CONSULT NOTE  Pharmacy Consult for heparin  Indication: pulmonary embolus  Allergies  Allergen Reactions   Penicillins Hives   Lucentis [Ranibizumab] Rash    Patient Measurements: Height: 6' 1 (185.4 cm) Weight: 84.5 kg (186 lb 4.6 oz) IBW/kg (Calculated) : 79.9 HEPARIN  DW (KG): 79.4  Vital Signs: Temp: 98.5 F (36.9 C) (10/31 0641) Temp Source: Oral (10/31 0641) BP: 108/46 (10/31 0745) Pulse Rate: 79 (10/31 0745)  Labs: Recent Labs    07/20/24 0513 07/20/24 1129 07/20/24 1725 07/20/24 1849 07/21/24 0416 07/21/24 1650 07/21/24 1949 07/22/24 0334  HGB 7.0*   < > 7.9*  --  7.8*  --   --  6.8*  HCT 20.8*   < > 23.3*  --  22.2*  --   --  20.2*  PLT 123*  --   --   --  131*  --   --  129*  HEPARINUNFRC 0.26*  --   --    < > 0.28*  --  0.34 0.29*  CREATININE 6.10*   < >  --   --  4.16* 3.03*  --  2.57*   < > = values in this interval not displayed.    Estimated Creatinine Clearance: 27.6 mL/min (A) (by C-G formula based on SCr of 2.57 mg/dL (H)).   Assessment: JC is a 59 YOM admitted with concern for spinal cord injury. Patient taken to the OR for neurosurgery, procedure complicated by multiple rounds of CPR.   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 S/p IR 10/27 for thrombectomy.  Heparin  level 0.29 is slightly subtherapeutic with heparin  running at 1100 units/hr. Hgb (6.8) is low, received 1 unit of PRBC overnight. PLTs (129) are stable. Per RN, no report of pauses or issues with the line. Some stable oozing from line site, but otherwise no active bleeding.   Goal of Therapy:  Heparin  level 0.3-0.7 units/ml Monitor platelets by anticoagulation protocol: Yes   Plan:  Increase heparin  infusion to 1200 units/hr Check 8 hour heparin  level Monitor daily heparin  level, CBC, and signs/symptoms of bleeding F/u plans for transition to oral [facility]   Thank you for allowing pharmacy to be a  part of this patient's care.   Nidia Schaffer, PharmD PGY2 Cardiology Pharmacy Resident  Please check AMION for all [facility] Pharmacy phone numbers After 10:00 PM, call Main Pharmacy [phone] 07/22/2024  7:58 AM

## 2024-07-23 LAB — CBC
HCT: 23.1 % — ABNORMAL LOW (ref 39.0–52.0)
Hemoglobin: 7.6 g/dL — ABNORMAL LOW (ref 13.0–17.0)
MCH: 31 pg (ref 26.0–34.0)
MCHC: 32.9 g/dL (ref 30.0–36.0)
MCV: 94.3 fL (ref 80.0–100.0)
Platelets: 135 K/uL — ABNORMAL LOW (ref 150–400)
RBC: 2.45 MIL/uL — ABNORMAL LOW (ref 4.22–5.81)
RDW: 16.6 % — ABNORMAL HIGH (ref 11.5–15.5)
WBC: 14.3 K/uL — ABNORMAL HIGH (ref 4.0–10.5)
nRBC: 2.1 % — ABNORMAL HIGH (ref 0.0–0.2)

## 2024-07-23 LAB — TYPE AND SCREEN
ABO/RH(D): O POS
Antibody Screen: NEGATIVE
Unit division: 0

## 2024-07-23 LAB — RENAL FUNCTION PANEL
Albumin: 2 g/dL — ABNORMAL LOW (ref 3.5–5.0)
Albumin: 2 g/dL — ABNORMAL LOW (ref 3.5–5.0)
Anion gap: 10 (ref 5–15)
Anion gap: 10 (ref 5–15)
BUN: 54 mg/dL — ABNORMAL HIGH (ref 8–23)
BUN: 53 mg/dL — ABNORMAL HIGH (ref 8–23)
CO2: 23 mmol/L (ref 22–32)
CO2: 23 mmol/L (ref 22–32)
Calcium: 8 mg/dL — ABNORMAL LOW (ref 8.9–10.3)
Calcium: 8 mg/dL — ABNORMAL LOW (ref 8.9–10.3)
Chloride: 100 mmol/L (ref 98–111)
Chloride: 100 mmol/L (ref 98–111)
Creatinine, Ser: 2.23 mg/dL — ABNORMAL HIGH (ref 0.61–1.24)
Creatinine, Ser: 2.33 mg/dL — ABNORMAL HIGH (ref 0.61–1.24)
GFR, Estimated: 30 mL/min — ABNORMAL LOW (ref >60)
GFR, Estimated: 28 mL/min — ABNORMAL LOW (ref >60)
Glucose, Bld: 291 mg/dL — ABNORMAL HIGH (ref 70–99)
Glucose, Bld: 262 mg/dL — ABNORMAL HIGH (ref 70–99)
Phosphorus: 1.8 mg/dL — ABNORMAL LOW (ref 2.5–4.6)
Phosphorus: 4.4 mg/dL (ref 2.5–4.6)
Potassium: 5.1 mmol/L (ref 3.5–5.1)
Potassium: 5.3 mmol/L — ABNORMAL HIGH (ref 3.5–5.1)
Sodium: 133 mmol/L — ABNORMAL LOW (ref 135–145)
Sodium: 133 mmol/L — ABNORMAL LOW (ref 135–145)

## 2024-07-23 LAB — GLUCOSE, CAPILLARY
Glucose-Capillary: 240 mg/dL — ABNORMAL HIGH (ref 70–99)
Glucose-Capillary: 204 mg/dL — ABNORMAL HIGH (ref 70–99)
Glucose-Capillary: 260 mg/dL — ABNORMAL HIGH (ref 70–99)
Glucose-Capillary: 247 mg/dL — ABNORMAL HIGH (ref 70–99)
Glucose-Capillary: 261 mg/dL — ABNORMAL HIGH (ref 70–99)
Glucose-Capillary: 293 mg/dL — ABNORMAL HIGH (ref 70–99)

## 2024-07-23 LAB — CULTURE, BLOOD (ROUTINE X 2)
Culture: NO GROWTH
Culture: NO GROWTH
Special Requests: ADEQUATE

## 2024-07-23 LAB — MAGNESIUM: Magnesium: 2.2 mg/dL (ref 1.7–2.4)

## 2024-07-23 LAB — BPAM RBC
Blood Product Expiration Date: 202511032359
ISSUE DATE / TIME: 202510310608
Unit Type and Rh: 5100

## 2024-07-23 LAB — PHOSPHORUS: Phosphorus: 2 mg/dL — ABNORMAL LOW (ref 2.5–4.6)

## 2024-07-23 LAB — HEPARIN LEVEL (UNFRACTIONATED): Heparin Unfractionated: 0.54 [IU]/mL (ref 0.30–0.70)

## 2024-07-23 MED ORDER — INSULIN GLARGINE-YFGN 100 UNIT/ML ~~LOC~~ SOLN
20.0000 [IU] | Freq: Every day | SUBCUTANEOUS | Status: DC
  Administered 2024-07-24: 20 [IU] via SUBCUTANEOUS
  Filled 2024-07-23: qty 0.2

## 2024-07-23 MED ORDER — POTASSIUM PHOSPHATES 15 MMOLE/5ML IV SOLN
30.0000 mmol | Freq: Once | INTRAVENOUS | Status: AC
  Administered 2024-07-23: 30 mmol via INTRAVENOUS
  Filled 2024-07-23: qty 10

## 2024-07-23 MED ORDER — ALBUMIN HUMAN 25 % IV SOLN
INTRAVENOUS | Status: AC
  Filled 2024-07-23: qty 50

## 2024-07-23 MED ORDER — INSULIN GLARGINE-YFGN 100 UNIT/ML ~~LOC~~ SOLN
10.0000 [IU] | Freq: Once | SUBCUTANEOUS | Status: AC
  Administered 2024-07-23: 10 [IU] via SUBCUTANEOUS
  Filled 2024-07-23: qty 0.1

## 2024-07-23 MED ORDER — OXYCODONE HCL 5 MG PO TABS
5.0000 mg | ORAL_TABLET | Freq: Four times a day (QID) | ORAL | Status: DC | PRN
  Administered 2024-07-23: 5 mg via ORAL
  Filled 2024-07-23: qty 1

## 2024-07-23 NOTE — Progress Notes (Addendum)
 NAME:  Lawrence [name], MRN:  [id number], DOB:  30-May-1946, LOS: 6 ADMISSION DATE:  07/16/2024, CONSULTATION DATE: 07/17/2024 REFERRING MD:  Debby Carrier , CHIEF COMPLAINT: Status postcardiac arrest  History of Present Illness:  78 year old male with diabetes, hypertension, BPH status post TURP who presented after having a ground-level fall with recent onset of numbness in his lower extremity- complete loss of sensation to BLE & urinary incontinence after fall.      Neurosurgery was consulted, patient mental OR for partial anterior cervical discectomy C7-T1 but course was complicated with multiple time PEA cardiac arrest in OR, procedure was aborted, TEE was done which showed severe RV dysfunction and dilatation with moderate to severe LV dysfunction, patient was transferred to ICU for close monitoring.  In ICU patient blood pressure started dropping and he went into PEA cardiac arrest again, ROSC was achieved after 2 minutes.  Unfortunately due to surgery, it was recommended no TNK/TPA.  Due to tenuous status and concern for re-arrest it was decided to hold on suction thrombectomy at the time.   Pertinent  Medical History   Past Medical History:  Diagnosis Date   Arthritis    Diabetes mellitus without complication (HCC)    GERD (gastroesophageal reflux disease)    Hypertension    Sleep apnea     Significant Hospital Events: Including procedures, antibiotic start and stop dates in addition to other pertinent events   Admitted and PCCM consulted, coded multiple times in OR (PEA) 07/18/2024 mechanical thrombectomy was done for bilateral pulmonary embolism. 10/28 nephro consult 10/29 on 5 NE, plan for HD cath placement and CRRT  10/30 add midodrine   Interim History / Subjective:  Patient has been off vasopressors.  Continues to be on CRRT.  Urine output is around 140 mL in the last 24 hours.  Failed swallow study today.  Continued on tube feeds.  Objective    Blood pressure (!)  129/49, pulse 69, temperature (!) 97.4 F (36.3 C), temperature source Axillary, resp. rate (!) 26, height 6' 1 (1.854 m), weight 82 kg, SpO2 95%. CVP:  [0 mmHg-19 mmHg] 5 mmHg      Intake/Output Summary (Last 24 hours) at 07/23/2024 1313 Last data filed at 07/23/2024 1300 Gross per 24 hour  Intake 2568.82 ml  Output 3742.8 ml  Net -1173.98 ml   Filed Weights   07/21/24 0430 07/22/24 0425 07/23/24 0323  Weight: 82.8 kg 84.5 kg 82 kg    Examination:  Physical exam: General: Chronically ill-appearing male, lying on the bed on room air in no distress. HEENT: [place]/AT, eyes anicteric.c-collar in place  Neuro: Alert, awake following commands does not move lower extremity able to move upper extremities. Chest: Coarse breath sounds, no wheezes or rhonchi Heart: Regular rate and rhythm, no murmurs or gallops S1-S2 heard no S3 no murmur Abdomen: Soft, nontender, nondistended, bowel sounds presen   Resolved problem list  Lactic acidosis  Endotracheally intubated  Assessment and Plan   PEA arrest Massive PE s/p thrombectomy  Acute BiV HF - improving Hypotension  -Status post mechanical thrombectomy on 07/18/2024 P - On midodrine off norepinephrine. 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  1 unit blood transfusion.  DM w Hyperglycemia -incr EN coverage 10/31 cont SSI and  basal  - Increase long acting insulin  to 20 today  Inadequate PO intake -adv EN per RDN -SLP consult--[place] with SLP today patient to remain NPO.  Labs   CBC: Recent Labs  Lab 07/16/24 1959 07/17/24 0425 07/19/24 0155 07/20/24 9486 07/20/24 1129 07/20/24 1725 07/21/24 0416 07/22/24 0334 07/23/24 0330  WBC 12.0*   < > 14.0* 12.4*  --   --  10.6* 11.7* 14.3*  NEUTROABS 10.3*  --   --   --   --   --   --   --   --   HGB 14.7   < > 9.0* 7.0* 6.6* 7.9* 7.8* 6.8* 7.6*  HCT 40.6   < > 26.3* 20.8* 19.4* 23.3* 22.2* 20.2* 23.1*  MCV 89.6   < > 92.6 95.9  --   --  91.0 94.4 94.3  PLT 253   < > 122* 123*  --   --  131* 129* 135*   < > = values in this interval not displayed.    Basic Metabolic Panel: Recent Labs  Lab 07/18/24 0417 07/18/24 0425 07/18/24 1616 07/18/24 1707 07/21/24 0416 07/21/24 1650 07/22/24 0334 07/22/24 1605 07/23/24 0330  NA 134*   < > 138   < > 134* 134* 135 136 133*  K 3.7   < > 3.2*   < > 4.4 5.1 5.3* 4.9 5.1  CL 100  --  103   < > 100 101 100 100 100  CO2 19*  --  19*   < > 22 23 24 24 23   GLUCOSE 372*  --  144*   < > 263* 358* 283* 276* 291*  BUN 41*  --  50*   < > 68* 57* 54* 48* 54*  CREATININE 2.90*  --  3.69*   < > 4.16* 3.03* 2.57* 2.36* 2.23*  CALCIUM 8.6*  --  8.3*   < > 7.5* 7.6* 7.7* 7.7* 8.0*  MG 1.8  --  1.9  --  2.3  --  2.3  --  2.2  PHOS  --   --   --    < > 4.0  4.2 2.4* 1.8*  1.8* 3.5 2.0*  1.8*   < > = values in this interval not displayed.   GFR: Estimated Creatinine Clearance: 31.8 mL/min (A) (by C-G formula based on SCr of 2.23 mg/dL (H)). Recent Labs  Lab 07/17/24 1756 07/17/24 2140 07/18/24 0417 07/18/24 1616 07/19/24 0155 07/20/24 0513 07/21/24 0416 07/22/24 0334 07/23/24 0330  WBC  --    < > 16.1* 16.4* 14.0* 12.4* 10.6* 11.7* 14.3*  LATICACIDVEN 8.6*  --  4.6* 3.2* 1.4  --   --   --   --    < > = values in this interval not displayed.    Liver Function Tests: Recent Labs  Lab 07/16/24 1959  07/18/24 0417 07/19/24 0155 07/19/24 2100 07/21/24 0416 07/21/24 1650 07/22/24 0334 07/22/24 1605 07/23/24 0330  AST 46* 1,476* 622*  --   --   --   --   --   --   ALT 20  978* 673*  --   --   --   --   --   --   ALKPHOS 103 58 50  --   --   --   --   --   --   BILITOT 0.7 1.3* 2.3*  --   --   --   --   --   --   PROT 7.3 4.3* 3.9*  --   --   --   --   --   --   ALBUMIN 4.3 2.1* 1.9*   < > 2.1* 1.9* 1.8* 2.1* 2.0*   < > = values in this interval not displayed.   Recent Labs  Lab 07/18/24 0417  LIPASE 29   No results for input(s): AMMONIA in the last 168 hours.  ABG    Component Value Date/Time   PHART 7.461 (H) 07/19/2024 0113   PCO2ART 29.2 (L) 07/19/2024 0113   PO2ART 158 (H) 07/19/2024 0113   HCO3 20.8 07/19/2024 0113   TCO2 22 07/19/2024 0113   ACIDBASEDEF 3.0 (H) 07/19/2024 0113   O2SAT 63.1 07/20/2024 1200     Coagulation Profile: Recent Labs  Lab 07/17/24 0425 07/17/24 1451 07/17/24 2140  INR 1.0 1.7* 1.7*    Cardiac Enzymes: Recent Labs  Lab 07/16/24 1959 07/17/24 0425  CKTOTAL 1,321* 938*    HbA1C: Hemoglobin A1C  Date/Time Value Ref Range Status  07/23/2021 12:00 AM 11.9  Final  10/27/2020 12:00 AM 9.3  Final   HbA1c, POC (controlled diabetic range)  Date/Time Value Ref Range Status  11/19/2021 11:44 AM 10.2 (A) 0.0 - 7.0 % Final   HbA1c POC (<> result, manual entry)  Date/Time Value Ref Range Status  03/18/2022 03:21 PM 10.8 4.0 - 5.6 % Final   Hgb A1c MFr Bld  Date/Time Value Ref Range Status  07/17/2024 04:25 AM 12.3 (H) 4.8 - 5.6 % Final    Comment:    (NOTE) Diagnosis of Diabetes The following HbA1c ranges recommended by the American Diabetes Association (ADA) may be used as an aid in the diagnosis of diabetes mellitus.  Hemoglobin             Suggested A1C NGSP%              Diagnosis  <5.7                   Non Diabetic  5.7-6.4                Pre-Diabetic  >6.4                   Diabetic  <7.0                    Glycemic control for                       adults with diabetes.    12/02/2022 11:30 AM 11.9 (H) 4.8 - 5.6 % Final    Comment:    (NOTE)         Prediabetes: 5.7 - 6.4         Diabetes: >6.4         Glycemic control for adults with diabetes: <7.0     CBG: Recent Labs  Lab 07/22/24 1920 07/22/24 2319 07/23/24 0311 07/23/24 0743 07/23/24 1147  GLUCAP 195* 219* 240* 204* 260*    CRITICAL CARE Performed by: Tamela Stakes   Total critical care time:  40 minutes   Tamela Stakes, MD  Attending Physician, Critical Care Medicine [facility] See Amion for pager If no response to pager, please call [phone] until 7pm After 7pm, Please call E-link [phone]

## 2024-07-23 NOTE — Evaluation (Signed)
 Clinical/Bedside Swallow Evaluation Patient Details  Name: Lawrence [name] MRN: [id number] Date of Birth: 1945/11/20  Today's Date: 07/23/2024 Time: SLP Start Time (ACUTE ONLY): 9170 SLP Stop Time (ACUTE ONLY): 0837 SLP Time Calculation (min) (ACUTE ONLY): 8 min  Past Medical History:  Past Medical History:  Diagnosis Date   Arthritis    Diabetes mellitus without complication (HCC)    GERD (gastroesophageal reflux disease)    Hypertension    Sleep apnea    Past Surgical History:  Past Surgical History:  Procedure Laterality Date   ANTERIOR CERVICAL DECOMP/DISCECTOMY FUSION N/A 07/17/2024   Procedure: ANTERIOR CERVICAL DECOMPRESSION/DISCECTOMY FUSION, CERVICAL SEVEN - THORACIC ONE;  Surgeon: Debby Dorn MATSU, MD;  Location: [facility] OR;  Service: Neurosurgery;  Laterality: N/A;  Partial Discecomy Cervical seven- Thoracic one   APPENDECTOMY     78 yo   BACK SURGERY  1975   lumbar diskectomy   done [facility]   COLONOSCOPY N/A 02/21/2015   Procedure: COLONOSCOPY;  Surgeon: Claudis RAYMOND Rivet, MD;  Location: [facility] ENDO SUITE;  Service: Endoscopy;  Laterality: N/A;  1025   COLONOSCOPY  11/13/2021   COLONOSCOPY WITH PROPOFOL  N/A 11/13/2021   Procedure: COLONOSCOPY WITH PROPOFOL ;  Surgeon: Rivet Claudis RAYMOND, MD;  Location: [facility] END[facility] SUITE;  Service: Endoscopy;  Laterality: N/A;  155   CYSTOSCOPY WITH URETHRAL DILATATION N/A 12/09/2022   Procedure: CYSTOSCOPY WITH URETHRAL DILATATION;  Surgeon: Watt Rush, MD;  Location: [facility] ORS;  Service: Urology;  Laterality: N/A;   EYE SURGERY Bilateral    Lasik surgery   IR ANGIOGRAM PULMONARY BILATERAL SELECTIVE  07/18/2024   IR ANGIOGRAM SELECTIVE EACH ADDITIONAL VESSEL  07/18/2024   IR THROMBECT PRIM MECH ADD (INCLU) MOD SED  07/18/2024   IR THROMBECT PRIM MECH ADD (INCLU) MOD SED  07/18/2024   IR THROMBECT PRIM MECH ADD (INCLU) MOD SED  07/18/2024   LEFT HEART CATH AND CORONARY ANGIOGRAPHY N/A 12/23/2020   Procedure: LEFT HEART CATH AND CORONARY  ANGIOGRAPHY;  Surgeon: Burnard Debby LABOR, MD;  Location: [facility] INVASIVE CV LAB;  Service: Cardiovascular;  Laterality: N/A;   NECK SURGERY  1999   Work injury, posterior fusion   POLYPECTOMY  11/13/2021   Procedure: POLYPECTOMY INTESTINAL;  Surgeon: Rivet Claudis RAYMOND, MD;  Location: [facility];  Service: Endoscopy;;   SHOULDER SURGERY Bilateral 2000   rotoator cuff repair   done at [facility]   TONSILLECTOMY     TRANSURETHRAL INCISION OF PROSTATE N/A 12/09/2022   Procedure: TRANSURETHRAL RESECTION OF PROSTATE;  Surgeon: Watt Rush, MD;  Location: [facility] ORS;  Service: Urology;  Laterality: N/A;  1 HR FOR CASE   HPI:  78 yo admitted 10/26 s/p fall with onset of numbness in BLE. Neurosurgery consulted, pt taken to OR for partial anterior cervical discectomy but procedure aborted secondary to multiple PEA arrests, intubated. Pt with acute biventricular HF, anuric AKI requiring initiation of CRRT. ETT 10/26-28. Mechanical thrombectory for bilateral PE 10/27. Cortrak 10/29. PMH includes DM, HTN, BPH s/p TURP    Assessment / Plan / Recommendation  Clinical Impression  Clinical swallowing evaluation was completed at request of the medical team.  RN approved patient for PO intake and reported that he was informed patient with choking with oral care using swabs.  Patient was able to be sat upright today per RN.    Limited cranial nerve was completed due to his issues following commands.  Lingual, labial, jaw and facial range of motion appeared adequate.  Strength and sensation of  the structures was unable to be assessed.  Vocal quality was aphonic.  Patient complained of a sore throat with swallowing. He was presented with ice chips and thin liquids via spoon.  Immediate throat clear/cough noted across textures.  He was also noted to swallow many times to clear 5cc bolus of thin liquids.  Given clinical presentation recommend he remain strict NPO.  ST will follow up for ongoing assessment. SLP Visit Diagnosis:  Dysphagia, unspecified (R13.10)    Aspiration Risk  Severe aspiration risk    Diet Recommendation NPO    Medication Administration: Via alternative means    Other  Recommendations Oral Care Recommendations: Oral care QID     Assistance Recommended at Discharge  TBD  Functional Status Assessment Patient has had a recent decline in their functional status and demonstrates the ability to make significant improvements in function in a reasonable and predictable amount of time.  Frequency and Duration min 2x/week  2 weeks       Prognosis Prognosis for improved oropharyngeal function: Fair      Swallow Study   General Date of Onset: 08/17/24 HPI: 78 yo admitted 10/26 s/p fall with onset of numbness in BLE. Neurosurgery consulted, pt taken to OR for partial anterior cervical discectomy but procedure aborted secondary to multiple PEA arrests, intubated. Pt with acute biventricular HF, anuric AKI requiring initiation of CRRT. ETT 10/26-28. Mechanical thrombectory for bilateral PE 10/27. Cortrak 10/29. PMH includes DM, HTN, BPH s/p TURP Type of Study: Bedside Swallow Evaluation Previous Swallow Assessment: None noted at [facility]. Diet Prior to this Study: Cortrak/Small bore NG tube Temperature Spikes Noted: Yes History of Recent Intubation: Yes Total duration of intubation (days): 2 days Date extubated: 08/19/24 Behavior/Cognition: Alert;Cooperative Oral Cavity Assessment: Within Functional Limits Oral Care Completed by SLP: No Oral Cavity - Dentition: Adequate natural dentition Self-Feeding Abilities: Total assist Patient Positioning: Upright in bed Baseline Vocal Quality: Low vocal intensity;Aphonic Volitional Swallow: Unable to elicit    Oral/Motor/Sensory Function Overall Oral Motor/Sensory Function: Other (comment) (Limited assessment.  ROM of the structures appeared adequate.  Strength and sensation was unable to be assessed.)   Ice Chips Ice chips: Impaired Presentation:  Spoon Oral Phase Impairments: Impaired mastication Oral Phase Functional Implications: Prolonged oral transit Pharyngeal Phase Impairments: Cough - Immediate   Thin Liquid Thin Liquid: Impaired Presentation: Spoon Pharyngeal  Phase Impairments: Cough - Immediate    Nectar Thick Nectar Thick Liquid: Not tested   Honey Thick Honey Thick Liquid: Not tested   Puree Puree: Not tested   Solid     Solid: Not tested     Eleanor Eagles, MA, CCC-SLP Acute Rehab SLP [phone]  Eleanor LOISE Eagles 07/23/2024,9:05 AM

## 2024-07-23 NOTE — Consult Note (Signed)
 WOC Nurse Consult Note:  Reason for Consult:L buttock  Wound type: Stage 2 Pressure Injury L buttock red moist  Pressure Injury POA: no  Measurement: see nursing flowsheet  Wound bed: as above Drainage (amount, consistency, odor) see nursing flowsheet  Periwound: lifting epidermis  Dressing procedure/placement/frequency: Cleanse L buttock wound with NS, apply Xeroform gauze Soila [id number]) to wound bed daily and secure with silicone foam.    POC discussed with bedside nurse. WOC team will not follow. Re-consult if further needs arise.   Thank you,    Powell Bar MSN, RN-BC, [employer]

## 2024-07-23 NOTE — Progress Notes (Signed)
 Lawrence [name] is an 78 y.o. male diabetes, hypertension, BPH status post TURP initially presenting after a ground-level fall about 5 days prior to admission. Of note over the past few weeks he has had neuropathy in his feet, felt more unsteady and used a cane because of his neuropathy.  Unfortunately he slipped and fell and hit his head on the sink followed by new onset numbness in his legs, inability to sit up and loss of function in his legs as well as urinary incontinence.  He was to have a partial anterior cervical discectomy C7-T1 to stabilize even though he was not likely to regain strength in his lower extremities but during the operative course he developed PEA arrest x 2 with subsequent CT scan showing a massive bilateral PE with poor lung perfusion.  Patient also had successful thrombectomy performed by VIR removing a large clot burden at the right PA bifurcation as well as a large clot burden in the left lower lung lobe. BL cr is approximately 0.9-1.1 (12/02/22) but with his complicated course he has developed renal failure with very poor urine output.  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 - He does not want long-term dialysis but is willing to give a trial with short-term dialysis to give his kidneys a chance to recover -Initially anuria with K trending up --> started CRRT on 10/29 mid afternoon. CVP only 8 tolerating UF 80ml/hr, no issues with filter clotting. + 4.6  L during hospitalization. Likely will be a prolonged course given significant renal injury, hopefully no cortical necrosis.  Had to change to 2K dialysate bath on 10/31 (K trending up), appears to be stabilizing; will increase UF goal 0-100 (currently on 50 and tolerating but incr to 100). Need to monitor carefully as pt was very sensitive to decrease Levophed the other day.  Replete Phos.  -Monitor  Daily I/Os, Daily weight  -Maintain MAP>65 for optimal renal perfusion.  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 Acute respiratory failure -off sedation and alert, and possible extubation trial pending weaning parameters Cardiogenic shock secondary to pulmonary embolus -off epinephrine, still on Levophed by cardiology  Subjective: Denies fever, chills, nausea, SOB; CVP 8   Chemistry and CBC: Creatinine  Date/Time Value Ref Range Status  02/21/2021 12:00 AM 1.2 0.6 - 1.3 Final  10/27/2020 12:00 AM 0.9 0.6 - 1.3 Final   Creatinine, Ser  Date/Time Value Ref Range Status  07/23/2024 03:30 AM 2.23 (H) 0.61 - 1.24 mg/dL Final  89/68/7974 95:94 PM 2.36 (H) 0.61 - 1.24 mg/dL Final  89/68/7974 96:65 AM 2.57 (H) 0.61 - 1.24 mg/dL Final  89/69/7974 95:49 PM 3.03 (H) 0.61 - 1.24 mg/dL Final  89/69/7974 95:83 AM 4.16 (H) 0.61 - 1.24 mg/dL Final  89/70/7974 95:60 PM 5.73 (H) 0.61 - 1.24 mg/dL Final  89/70/7974 94:86 AM 6.10 (H) 0.61 - 1.24 mg/dL Final  89/71/7974 90:99 PM 5.41 (H) 0.61 - 1.24 mg/dL Final  89/71/7974 97:46 PM 5.16 (H) 0.61 - 1.24 mg/dL Final  89/71/7974 98:44 AM 4.18 (H) 0.61 - 1.24 mg/dL Final  89/72/7974 95:83 PM 3.69 (H) 0.61 - 1.24  mg/dL Final  89/72/7974 95:82 AM 2.90 (H) 0.61 - 1.24 mg/dL Final  89/73/7974 90:59 PM 2.37 (H) 0.61 - 1.24 mg/dL Final  89/73/7974 97:48 PM 1.50 (H) 0.61 - 1.24 mg/dL Final  89/73/7974 95:74 AM 0.99 0.61 - 1.24 mg/dL Final  89/74/7974 92:40 PM 1.08 0.61 - 1.24 mg/dL Final  96/87/7975 89:41 AM 1.02 0.61 - 1.24 mg/dL Final  97/79/7976 98:95 PM 0.92 0.61 - 1.24 mg/dL Final  95/96/7977 93:65 PM 0.86 0.61 - 1.24 mg/dL Final   Recent Labs  Lab 07/19/24 2100  07/20/24 0513 07/20/24 1639 07/21/24 0416 07/21/24 1650 07/22/24 0334 07/22/24 1605 07/23/24 0330  NA 137 137 135 134* 134* 135 136 133*  K 4.6 4.7 5.0 4.4 5.1 5.3* 4.9 5.1  CL 101 101 100 100 101 100 100 100  CO2 24 24 21* 22 23 24 24 23   GLUCOSE 123* 131* 158* 263* 358* 283* 276* 291*  BUN 73* 84* 88* 68* 57* 54* 48* 54*  CREATININE 5.41* 6.10* 5.73* 4.16* 3.03* 2.57* 2.36* 2.23*  CALCIUM 7.6* 7.9* 7.4* 7.5* 7.6* 7.7* 7.7* 8.0*  PHOS 6.4*  --  6.3* 4.0  4.2 2.4* 1.8*  1.8* 3.5 2.0*  1.8*   Recent Labs  Lab 07/16/24 1959 07/17/24 0425 07/20/24 0513 07/20/24 1129 07/20/24 1725 07/21/24 0416 07/22/24 0334 07/23/24 0330  WBC 12.0*   < > 12.4*  --   --  10.6* 11.7* 14.3*  NEUTROABS 10.3*  --   --   --   --   --   --   --   HGB 14.7   < > 7.0*   < > 7.9* 7.8* 6.8* 7.6*  HCT 40.6   < > 20.8*   < > 23.3* 22.2* 20.2* 23.1*  MCV 89.6   < > 95.9  --   --  91.0 94.4 94.3  PLT 253   < > 123*  --   --  131* 129* 135*   < > = values in this interval not displayed.   Liver Function Tests: Recent Labs  Lab 07/16/24 1959 07/18/24 0417 07/19/24 0155 07/19/24 2100 07/22/24 0334 07/22/24 1605 07/23/24 0330  AST 46* 1,476* 622*  --   --   --   --   ALT 20 978* 673*  --   --   --   --   ALKPHOS 103 58 50  --   --   --   --   BILITOT 0.7 1.3* 2.3*  --   --   --   --   PROT 7.3 4.3* 3.9*  --   --   --   --   ALBUMIN 4.3 2.1* 1.9*   < > 1.8* 2.1* 2.0*   < > = values in this interval not displayed.   Recent Labs  Lab 07/18/24 0417  LIPASE 29   No results for input(s): AMMONIA in the last 168 hours. Cardiac Enzymes: Recent Labs  Lab 07/16/24 1959 07/17/24 0425  CKTOTAL 1,321* 938*   Iron Studies: No results for input(s): IRON, TIBC, TRANSFERRIN, FERRITIN in the last 72 hours. PT/INR: @LABRCNTIP (inr:5)  Xrays/Other Studies: ) Results for orders placed or performed during the hospital encounter of 07/16/24 (from the past 48 hours)  Glucose, capillary      Status: Abnormal   Collection Time: 07/21/24 10:55 AM  Result Value Ref Range   Glucose-Capillary 279 (H) 70 - 99 mg/dL    Comment: Glucose reference range applies only to samples taken after fasting for  at least 8 hours.  Glucose, capillary     Status: Abnormal   Collection Time: 07/21/24  4:03 PM  Result Value Ref Range   Glucose-Capillary 265 (H) 70 - 99 mg/dL    Comment: Glucose reference range applies only to samples taken after fasting for at least 8 hours.  Renal function panel (daily at 1600)     Status: Abnormal   Collection Time: 07/21/24  4:50 PM  Result Value Ref Range   Sodium 134 (L) 135 - 145 mmol/L   Potassium 5.1 3.5 - 5.1 mmol/L   Chloride 101 98 - 111 mmol/L   CO2 23 22 - 32 mmol/L   Glucose, Bld 358 (H) 70 - 99 mg/dL    Comment: Glucose reference range applies only to samples taken after fasting for at least 8 hours.   BUN 57 (H) 8 - 23 mg/dL   Creatinine, Ser 6.96 (H) 0.61 - 1.24 mg/dL   Calcium 7.6 (L) 8.9 - 10.3 mg/dL   Phosphorus 2.4 (L) 2.5 - 4.6 mg/dL   Albumin 1.9 (L) 3.5 - 5.0 g/dL   GFR, Estimated 21 (L) >60 mL/min    Comment: (NOTE) Calculated using the CKD-EPI Creatinine Equation (2021)    Anion gap 10 5 - 15    Comment: Performed at [facility] Lab, [address], [place], [place] [address]  Glucose, capillary     Status: Abnormal   Collection Time: 07/21/24  7:35 PM  Result Value Ref Range   Glucose-Capillary 252 (H) 70 - 99 mg/dL    Comment: Glucose reference range applies only to samples taken after fasting for at least 8 hours.  Heparin  level (unfractionated)     Status: None   Collection Time: 07/21/24  7:49 PM  Result Value Ref Range   Heparin  Unfractionated 0.34 0.30 - 0.70 IU/mL    Comment: (NOTE) The clinical reportable range upper limit is being lowered to >1.10 to align with the FDA approved guidance for the current laboratory assay.  If heparin  results are below expected values, and patient dosage has  been confirmed,  suggest follow up testing of antithrombin III levels. Performed at [facility] Lab, [address], [place], [place] [address]   Glucose, capillary     Status: Abnormal   Collection Time: 07/21/24 11:21 PM  Result Value Ref Range   Glucose-Capillary 173 (H) 70 - 99 mg/dL    Comment: Glucose reference range applies only to samples taken after fasting for at least 8 hours.  Glucose, capillary     Status: Abnormal   Collection Time: 07/22/24  3:21 AM  Result Value Ref Range   Glucose-Capillary 217 (H) 70 - 99 mg/dL    Comment: Glucose reference range applies only to samples taken after fasting for at least 8 hours.  CBC     Status: Abnormal   Collection Time: 07/22/24  3:34 AM  Result Value Ref Range   WBC 11.7 (H) 4.0 - 10.5 K/uL   RBC 2.14 (L) 4.22 - 5.81 MIL/uL   Hemoglobin 6.8 (LL) 13.0 - 17.0 g/dL    Comment: REPEATED TO VERIFY This critical result has been called to M. PRITCHARD, RN by [facility] [place] on 07/22/2024 03:56:25, and has been read back.    HCT 20.2 (L) 39.0 - 52.0 %   MCV 94.4 80.0 - 100.0 fL   MCH 31.8 26.0 - 34.0 pg   MCHC 33.7 30.0 - 36.0 g/dL   RDW 84.0 (H) 88.4 - 84.4 %  Platelets 129 (L) 150 - 400 K/uL    Comment: REPEATED TO VERIFY   nRBC 1.6 (H) 0.0 - 0.2 %    Comment: Performed at [facility], [address] [place], [place] [address]  Heparin  level (unfractionated)     Status: Abnormal   Collection Time: 07/22/24  3:34 AM  Result Value Ref Range   Heparin  Unfractionated 0.29 (L) 0.30 - 0.70 IU/mL    Comment: (NOTE) The clinical reportable range upper limit is being lowered to >1.10 to align with the FDA approved guidance for the current laboratory assay.  If heparin  results are below expected values, and patient dosage has  been confirmed, suggest follow up testing of antithrombin III levels. Performed at [facility], [address] [place], [place] [address]   Renal function panel (daily at 0500)     Status: Abnormal    Collection Time: 07/22/24  3:34 AM  Result Value Ref Range   Sodium 135 135 - 145 mmol/L   Potassium 5.3 (H) 3.5 - 5.1 mmol/L   Chloride 100 98 - 111 mmol/L   CO2 24 22 - 32 mmol/L   Glucose, Bld 283 (H) 70 - 99 mg/dL    Comment: Glucose reference range applies only to samples taken after fasting for at least 8 hours.   BUN 54 (H) 8 - 23 mg/dL   Creatinine, Ser 7.42 (H) 0.61 - 1.24 mg/dL   Calcium 7.7 (L) 8.9 - 10.3 mg/dL   Phosphorus 1.8 (L) 2.5 - 4.6 mg/dL   Albumin 1.8 (L) 3.5 - 5.0 g/dL   GFR, Estimated 25 (L) >60 mL/min    Comment: (NOTE) Calculated using the CKD-EPI Creatinine Equation (2021)    Anion gap 11 5 - 15    Comment: Performed at [facility] Lab, [address], [place], [place] [address]  Magnesium     Status: None   Collection Time: 07/22/24  3:34 AM  Result Value Ref Range   Magnesium 2.3 1.7 - 2.4 mg/dL    Comment: Performed at [facility], [address] [place], [place] [address]  Phosphorus     Status: Abnormal   Collection Time: 07/22/24  3:34 AM  Result Value Ref Range   Phosphorus 1.8 (L) 2.5 - 4.6 mg/dL    Comment: Performed at [facility] Lab, [address], [place], [place] [address]  Type and screen [facility]     Status: None (Preliminary result)   Collection Time: 07/22/24  4:49 AM  Result Value Ref Range   ABO/RH(D) O POS    Antibody Screen NEG    Sample Expiration 07/25/2024,2359    Unit Number [id number]    Blood Component Type RED CELLS,LR    Unit division 00    Status of Unit ISSUED    Transfusion Status OK TO TRANSFUSE    Crossmatch Result      Compatible Performed at [facility], [address] [place], [place] [address]   Prepare RBC (crossmatch)     Status: None   Collection Time: 07/22/24  4:50 AM  Result Value Ref Range   Order Confirmation      ORDER PROCESSED BY BLOOD BANK Performed at [facility] Lab, [address] [place], [place] [address]   Glucose, capillary      Status: Abnormal   Collection Time: 07/22/24  8:03 AM  Result Value Ref Range   Glucose-Capillary 226 (H) 70 - 99  mg/dL    Comment: Glucose reference range applies only to samples taken after fasting for at least 8 hours.  Glucose, capillary     Status: Abnormal   Collection Time: 07/22/24  8:06 AM  Result Value Ref Range   Glucose-Capillary 218 (H) 70 - 99 mg/dL    Comment: Glucose reference range applies only to samples taken after fasting for at least 8 hours.  Glucose, capillary     Status: Abnormal   Collection Time: 07/22/24 11:28 AM  Result Value Ref Range   Glucose-Capillary 201 (H) 70 - 99 mg/dL    Comment: Glucose reference range applies only to samples taken after fasting for at least 8 hours.  Glucose, capillary     Status: Abnormal   Collection Time: 07/22/24  3:49 PM  Result Value Ref Range   Glucose-Capillary 265 (H) 70 - 99 mg/dL    Comment: Glucose reference range applies only to samples taken after fasting for at least 8 hours.  Renal function panel (daily at 1600)     Status: Abnormal   Collection Time: 07/22/24  4:05 PM  Result Value Ref Range   Sodium 136 135 - 145 mmol/L   Potassium 4.9 3.5 - 5.1 mmol/L   Chloride 100 98 - 111 mmol/L   CO2 24 22 - 32 mmol/L   Glucose, Bld 276 (H) 70 - 99 mg/dL    Comment: Glucose reference range applies only to samples taken after fasting for at least 8 hours.   BUN 48 (H) 8 - 23 mg/dL   Creatinine, Ser 7.63 (H) 0.61 - 1.24 mg/dL   Calcium 7.7 (L) 8.9 - 10.3 mg/dL   Phosphorus 3.5 2.5 - 4.6 mg/dL   Albumin 2.1 (L) 3.5 - 5.0 g/dL   GFR, Estimated 28 (L) >60 mL/min    Comment: (NOTE) Calculated using the CKD-EPI Creatinine Equation (2021)    Anion gap 12 5 - 15    Comment: Performed at [facility] Lab, [address], [place], [place] [address]  Heparin  level (unfractionated)     Status: None   Collection Time: 07/22/24  4:05 PM  Result Value Ref Range   Heparin  Unfractionated 0.47 0.30 - 0.70 IU/mL    Comment:  (NOTE) The clinical reportable range upper limit is being lowered to >1.10 to align with the FDA approved guidance for the current laboratory assay.  If heparin  results are below expected values, and patient dosage has  been confirmed, suggest follow up testing of antithrombin III levels. Performed at [facility], [address], [place], [place] [address]   Glucose, capillary     Status: Abnormal   Collection Time: 07/22/24  7:20 PM  Result Value Ref Range   Glucose-Capillary 195 (H) 70 - 99 mg/dL    Comment: Glucose reference range applies only to samples taken after fasting for at least 8 hours.  Glucose, capillary     Status: Abnormal   Collection Time: 07/22/24 11:19 PM  Result Value Ref Range   Glucose-Capillary 219 (H) 70 - 99 mg/dL    Comment: Glucose reference range applies only to samples taken after fasting for at least 8 hours.  Glucose, capillary     Status: Abnormal   Collection Time: 07/23/24  3:11 AM  Result Value Ref Range   Glucose-Capillary 240 (H) 70 - 99 mg/dL    Comment: Glucose reference range applies only to samples taken after fasting for at least 8 hours.  CBC  Status: Abnormal   Collection Time: 07/23/24  3:30 AM  Result Value Ref Range   WBC 14.3 (H) 4.0 - 10.5 K/uL   RBC 2.45 (L) 4.22 - 5.81 MIL/uL   Hemoglobin 7.6 (L) 13.0 - 17.0 g/dL   HCT 76.8 (L) 60.9 - 47.9 %   MCV 94.3 80.0 - 100.0 fL   MCH 31.0 26.0 - 34.0 pg   MCHC 32.9 30.0 - 36.0 g/dL   RDW 83.3 (H) 88.4 - 84.4 %   Platelets 135 (L) 150 - 400 K/uL   nRBC 2.1 (H) 0.0 - 0.2 %    Comment: Performed at [facility], [address] [place], [place] [address]  Heparin  level (unfractionated)     Status: None   Collection Time: 07/23/24  3:30 AM  Result Value Ref Range   Heparin  Unfractionated 0.54 0.30 - 0.70 IU/mL    Comment: (NOTE) The clinical reportable range upper limit is being lowered to >1.10 to align with the FDA approved guidance for the current  laboratory assay.  If heparin  results are below expected values, and patient dosage has  been confirmed, suggest follow up testing of antithrombin III levels. Performed at [facility], [address] [place], [place] [address]   Renal function panel (daily at 0500)     Status: Abnormal   Collection Time: 07/23/24  3:30 AM  Result Value Ref Range   Sodium 133 (L) 135 - 145 mmol/L   Potassium 5.1 3.5 - 5.1 mmol/L   Chloride 100 98 - 111 mmol/L   CO2 23 22 - 32 mmol/L   Glucose, Bld 291 (H) 70 - 99 mg/dL    Comment: Glucose reference range applies only to samples taken after fasting for at least 8 hours.   BUN 54 (H) 8 - 23 mg/dL   Creatinine, Ser 7.76 (H) 0.61 - 1.24 mg/dL   Calcium 8.0 (L) 8.9 - 10.3 mg/dL   Phosphorus 1.8 (L) 2.5 - 4.6 mg/dL   Albumin 2.0 (L) 3.5 - 5.0 g/dL   GFR, Estimated 30 (L) >60 mL/min    Comment: (NOTE) Calculated using the CKD-EPI Creatinine Equation (2021)    Anion gap 10 5 - 15    Comment: Performed at [facility], [address], [place], [place] [address]  Magnesium     Status: None   Collection Time: 07/23/24  3:30 AM  Result Value Ref Range   Magnesium 2.2 1.7 - 2.4 mg/dL    Comment: Performed at [facility] Lab, [address], [place], [place] [address]  Phosphorus     Status: Abnormal   Collection Time: 07/23/24  3:30 AM  Result Value Ref Range   Phosphorus 2.0 (L) 2.5 - 4.6 mg/dL    Comment: Performed at [facility] Lab, [address], [place], [place] [address]  Glucose, capillary     Status: Abnormal   Collection Time: 07/23/24  7:43 AM  Result Value Ref Range   Glucose-Capillary 204 (H) 70 - 99 mg/dL    Comment: Glucose reference range applies only to samples taken after fasting for at least 8 hours.   No results found.   PMH:   Past Medical History:  Diagnosis Date   Arthritis    Diabetes mellitus without complication (HCC)    GERD (gastroesophageal reflux disease)    Hypertension    Sleep apnea      PSH:   Past Surgical History:  Procedure Laterality Date  ANTERIOR CERVICAL DECOMP/DISCECTOMY FUSION N/A 07/17/2024   Procedure: ANTERIOR CERVICAL DECOMPRESSION/DISCECTOMY FUSION, CERVICAL SEVEN - THORACIC ONE;  Surgeon: Debby Dorn MATSU, MD;  Location: [facility] OR;  Service: Neurosurgery;  Laterality: N/A;  Partial Discecomy Cervical seven- Thoracic one   APPENDECTOMY     78 yo   BACK SURGERY  1975   lumbar diskectomy   done [facility]   COLONOSCOPY N/A 02/21/2015   Procedure: COLONOSCOPY;  Surgeon: Claudis RAYMOND Rivet, MD;  Location: [facility] END[facility] SUITE;  Service: Endoscopy;  Laterality: N/A;  1025   COLONOSCOPY  11/13/2021   COLONOSCOPY WITH PROPOFOL  N/A 11/13/2021   Procedure: COLONOSCOPY WITH PROPOFOL ;  Surgeon: Rivet Claudis RAYMOND, MD;  Location: [facility] SU[facility];  Service: Endoscopy;  Laterality: N/A;  155   CYSTOSCOPY WITH URETHRAL DILATATION N/A 12/09/2022   Procedure: CYSTOSCOPY WITH URETHRAL DILATATION;  Surgeon: Watt Rush, MD;  Location: [facility] ORS;  Service: Urology;  Laterality: N/A;   EYE SURGERY Bilateral    Lasik surgery   IR ANGIOGRAM PULMONARY BILATERAL SELECTIVE  07/18/2024   IR ANGIOGRAM SELECTIVE EACH ADDITIONAL VESSEL  07/18/2024   IR THROMBECT PRIM MECH ADD (INCLU) MOD SED  07/18/2024   IR THROMBECT PRIM MECH ADD (INCLU) MOD SED  07/18/2024   IR THROMBECT PRIM MECH ADD (INCLU) MOD SED  07/18/2024   LEFT HEART CATH AND CORONARY ANGIOGRAPHY N/A 12/23/2020   Procedure: LEFT HEART CATH AND CORONARY ANGIOGRAPHY;  Surgeon: Burnard Debby LABOR, MD;  Location: [facility] INVASIVE CV LAB;  Service: Cardiovascular;  Laterality: N/A;   NECK SURGERY  1999   Work injury, posterior fusion   POLYPECTOMY  11/13/2021   Procedure: POLYPECTOMY INTESTINAL;  Surgeon: Rivet Claudis RAYMOND, MD;  Location: [facility];  Service: Endoscopy;;   SHOULDER SURGERY Bilateral 2000   rotoator cuff repair   [facility] at [facility]   TONSILLECTOMY     TRANSURETHRAL INCISION OF PROSTATE N/A 12/09/2022   Procedure:  TRANSURETHRAL RESECTION OF PROSTATE;  Surgeon: Watt Rush, MD;  Location: [facility] ORS;  Service: Urology;  Laterality: N/A;  1 HR FOR CASE    Allergies:  Allergies  Allergen Reactions   Penicillins Hives   Lucentis [Ranibizumab] Rash    Medications:   Prior to Admission medications   Medication Sig Start Date End Date Taking? Authorizing Provider  Apple Cider Vinegar 600 MG CAPS Take 600 mg by mouth daily.   Yes [provider]  aspirin  EC 81 MG tablet Take 1 tablet (81 mg total) by mouth every other day. 11/14/21  Yes Rehman, Claudis RAYMOND, MD  Barberry-Oreg Grape-Goldenseal (BERBERINE COMPLEX PO) Take 1 capsule by mouth daily.   Yes [provider]  cholecalciferol (VITAMIN D3) 25 MCG (1000 UNIT) tablet Take 1,000 Units by mouth daily.   Yes [provider]  Cinnamon 500 MG capsule Take 500 mg by mouth daily.   Yes [provider]  diphenhydrAMINE HCl (NERVINE PO) Take 1 tablet by mouth daily. Patient states that he takes one a day   Yes [provider]  empagliflozin (JARDIANCE) 10 MG TABS tablet Take 10 mg by mouth daily.   Yes [provider]  ferrous sulfate 325 (65 FE) MG tablet Take 325 mg by mouth daily with breakfast.   Yes [provider]  hydrochlorothiazide  (HYDRODIURIL ) 25 MG tablet Take 25 mg by mouth daily. 07/23/21  Yes [provider]  insulin  degludec (TRESIBA  FLEXTOUCH) 100 UNIT/ML FlexTouch Pen Inject 12 Units into the skin at bedtime. Patient taking differently: Inject  28 Units into the skin 2 (two) times daily. 10/07/22  Yes Reardon, Benton PARAS, NP  Multiple Vitamins-Minerals (OCUVITE ADULT 50+ PO) Take 1 tablet by mouth daily.   Yes [provider]  OVER THE COUNTER MEDICATION Take 1 capsule by mouth daily. Prostagenix   Yes [provider]  OVER THE COUNTER MEDICATION Take 1 capsule by mouth daily. Diabacore (Diabetic Supplement)   Yes [provider]  OVER THE COUNTER MEDICATION  Take 1 tablet by mouth in the morning and at bedtime. Glucoceil   Yes [provider]  tadalafil  (CIALIS ) 5 MG tablet Take 1 tablet (5 mg total) by mouth daily as needed for erectile dysfunction. 12/06/20  Yes Watt Rush, MD  vitamin E 180 MG (400 UNITS) capsule Take 400 Units by mouth daily.   Yes [provider]  Vitamin Mixture (ESTER-C PO) Take 1,000 mg by mouth daily.   Yes [provider]    Discontinued Meds:   Medications Discontinued During This Encounter  Medication Reason   0.9 %  sodium chloride  infusion    lactated ringers  infusion Patient Transfer   insulin  aspart (novoLOG ) injection 0-7 Units Patient Transfer   lidocaine -EPINEPHrine (XYLOCAINE  W/EPI) 1 %-1:100000 (with pres) injection Patient Transfer   0.9 % irrigation (POUR BTL) Patient Transfer   Surgifoam 1 Gm with Thrombin 5,000 units (5 ml) topical solution Patient Transfer   HYDROmorphone  (DILAUDID ) injection 0.25-0.5 mg Patient Transfer   droperidol (INAPSINE) 2.5 MG/ML injection 0.625 mg Patient Transfer   insulin  glargine-yfgn (SEMGLEE) injection 28 Units    midazolam  (VERSED ) 2 MG/2ML injection Returned to ADS   Thrombi-Pad 3X3 pad 1 each    insulin  aspart (novoLOG ) injection 0-6 Units    insulin  glargine-yfgn (SEMGLEE) injection 20 Units    phenylephrine (NEO-SYNEPHRINE) 20mg /NS 250mL premix infusion    heparin  ADULT infusion 100 units/mL (25000 units/250mL)    fentaNYL  (SUBLIMAZE ) injection 25-50 mcg    midazolam  PF (VERSED ) injection 4 mg    norepinephrine (LEVOPHED) 4mg  in 250mL (0.016 mg/mL) premix infusion    heparin  ADULT infusion 100 units/mL (25000 units/250mL)    vasopressin (PITRESSIN) 20 Units in 100 mL (0.2 unit/mL) infusion-*FOR SHOCK*    dexmedetomidine (PRECEDEX) 400 MCG/100ML (4 mcg/mL) infusion    midazolam  (VERSED ) 100 mg/100 mL (1 mg/mL) premix infusion    calcium chloride injection 1 g    Oral care mouth rinse    lactated ringers  infusion    fentaNYL   in NS 250mL (39mcg/ml) infusion-PREMIX    fentaNYL  (SUBLIMAZE ) bolus via infusion 25-100 mcg    EPINEPHrine (ADRENALIN) 5 mg in NS 250 mL (0.02 mg/mL) premix infusion    midazolam  (VERSED ) bolus via infusion 2 mg    insulin  regular, human (MYXREDLIN) 100 units/ 100 mL infusion    dextrose  50 % solution 0-50 mL    propofol  (DIPRIVAN ) 1000 MG/100ML infusion    lidocaine  (XYLOCAINE ) 1 % (with pres) injection 20 mL    glycopyrrolate (ROBINUL) injection 0.1 mg    heparin  injection 1,000-6,000 Units    ceFEPIme (MAXIPIME) 2 g in sodium chloride  0.9 % 100 mL IVPB    ceFEPIme (MAXIPIME) 2 g in sodium chloride  0.9 % 100 mL IVPB    feeding supplement (PROSource TF20) liquid 60 mL    insulin  aspart (novoLOG ) injection 0-9 Units    insulin  aspart (novoLOG ) injection 2 Units    midodrine (PROAMATINE) tablet 5 mg    midodrine (PROAMATINE) tablet 5 mg    pantoprazole  (PROTONIX ) injection 40 mg  polyethylene glycol (MIRALAX / GLYCOLAX) packet 17 g    insulin  aspart (novoLOG ) injection 4 Units    prismasol BGK 4/2.5 infusion    midodrine (PROAMATINE) tablet 5 mg     Social History:  reports that he has never smoked. He has never used smokeless tobacco. He reports that he does not drink alcohol and does not use drugs.  Family History:  History reviewed. No pertinent family history.  Blood pressure (!) 133/47, pulse 83, temperature (!) 97.4 F (36.3 C), temperature source Axillary, resp. rate 19, height 6' 1 (1.854 m), weight 82 kg, SpO2 95%. Physical Exam: General appearance: alert, cooperative, and appears stated age Head: NCAT Neck: no adenopathy, brace Resp: CTA b/l Cardio: regular rate and rhythm GI: SNDNT Extremities: tr edema Pulses: 2+ and symmetric     Colin Norment, LYNWOOD ORN, MD 07/23/2024, 8:40 AM

## 2024-07-23 NOTE — Progress Notes (Signed)
 PHARMACY - ANTICOAGULATION CONSULT NOTE  Pharmacy Consult for heparin  Indication: pulmonary embolus  Allergies  Allergen Reactions   Penicillins Hives   Lucentis [Ranibizumab] Rash    Patient Measurements: Height: 6' 1 (185.4 cm) Weight: 82 kg (180 lb 12.4 oz) IBW/kg (Calculated) : 79.9 HEPARIN  DW (KG): 79.4  Vital Signs: Temp: 97.4 F (36.3 C) (11/01 0700) Temp Source: Axillary (11/01 0700) BP: 130/45 (11/01 1000) Pulse Rate: 76 (11/01 1000)  Labs: Recent Labs    07/21/24 0416 07/21/24 1650 07/22/24 0334 07/22/24 1605 07/23/24 0330  HGB 7.8*  --  6.8*  --  7.6*  HCT 22.2*  --  20.2*  --  23.1*  PLT 131*  --  129*  --  135*  HEPARINUNFRC 0.28*   < > 0.29* 0.47 0.54  CREATININE 4.16*   < > 2.57* 2.36* 2.23*   < > = values in this interval not displayed.    Estimated Creatinine Clearance: 31.8 mL/min (A) (by C-G formula based on SCr of 2.23 mg/dL (H)).   Assessment: JC is a 84 YOM admitted with concern for spinal cord injury. Patient taken to the OR for neurosurgery, procedure complicated by multiple rounds of CPR.   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 S/p IR 10/27 for thrombectomy.  Heparin  level 0.54 is therapeutic with heparin  running at 1200 units/hr. Hgb (7.6) and PLTs (135) are stable. Per RN, no report of pauses, issues with the line. Some stable oozing around his IV line, but nothing concerning.    Goal of Therapy:  Heparin  level 0.3-0.7 units/ml Monitor platelets by anticoagulation protocol: Yes   Plan:  Continue heparin  infusion at 1200 units/hr Monitor daily heparin  level, CBC, and signs/symptoms of bleeding F/u plans for transition to oral [facility]   Thank you for allowing pharmacy to be a part of this patient's care.   Nidia Schaffer, PharmD PGY2 Cardiology Pharmacy Resident  Please check AMION for all [facility] Pharmacy phone numbers After 10:00 PM, call Main Pharmacy [phone] 07/23/2024   11:01 AM

## 2024-07-23 NOTE — Progress Notes (Signed)
   07/23/24 1541  Spiritual Encounters  Type of Visit Initial  Care provided to: Patient;Family  Conversation partners present during encounter Nurse  Reason for visit Advance directives  OnCall Visit No   This chaplain visited the patient and family.  The patient was not awake for conversation at the time but the family had questions about an Proofreader. I gave them a blank document that was provided by the [facility] front desk and explained the purpose and how the process works to implement the document. Their goal is to work with the patient to get the document filled out this weekend and then ask for a chaplain first thing on Monday morning to get it finalized.

## 2024-07-23 DEATH — deceased

## 2024-07-24 LAB — GLUCOSE, CAPILLARY
Glucose-Capillary: 188 mg/dL — ABNORMAL HIGH (ref 70–99)
Glucose-Capillary: 238 mg/dL — ABNORMAL HIGH (ref 70–99)
Glucose-Capillary: 280 mg/dL — ABNORMAL HIGH (ref 70–99)
Glucose-Capillary: 221 mg/dL — ABNORMAL HIGH (ref 70–99)
Glucose-Capillary: 228 mg/dL — ABNORMAL HIGH (ref 70–99)
Glucose-Capillary: 325 mg/dL — ABNORMAL HIGH (ref 70–99)

## 2024-07-24 LAB — CBC
HCT: 23.8 % — ABNORMAL LOW (ref 39.0–52.0)
Hemoglobin: 7.8 g/dL — ABNORMAL LOW (ref 13.0–17.0)
MCH: 31.5 pg (ref 26.0–34.0)
MCHC: 32.8 g/dL (ref 30.0–36.0)
MCV: 96 fL (ref 80.0–100.0)
Platelets: 148 K/uL — ABNORMAL LOW (ref 150–400)
RBC: 2.48 MIL/uL — ABNORMAL LOW (ref 4.22–5.81)
RDW: 16.5 % — ABNORMAL HIGH (ref 11.5–15.5)
WBC: 16.4 K/uL — ABNORMAL HIGH (ref 4.0–10.5)
nRBC: 0.5 % — ABNORMAL HIGH (ref 0.0–0.2)

## 2024-07-24 LAB — RENAL FUNCTION PANEL
Albumin: 2 g/dL — ABNORMAL LOW (ref 3.5–5.0)
Albumin: 2.1 g/dL — ABNORMAL LOW (ref 3.5–5.0)
Anion gap: 11 (ref 5–15)
Anion gap: 11 (ref 5–15)
BUN: 49 mg/dL — ABNORMAL HIGH (ref 8–23)
BUN: 48 mg/dL — ABNORMAL HIGH (ref 8–23)
CO2: 23 mmol/L (ref 22–32)
CO2: 24 mmol/L (ref 22–32)
Calcium: 8.5 mg/dL — ABNORMAL LOW (ref 8.9–10.3)
Calcium: 8.8 mg/dL — ABNORMAL LOW (ref 8.9–10.3)
Chloride: 100 mmol/L (ref 98–111)
Chloride: 100 mmol/L (ref 98–111)
Creatinine, Ser: 2.13 mg/dL — ABNORMAL HIGH (ref 0.61–1.24)
Creatinine, Ser: 1.91 mg/dL — ABNORMAL HIGH (ref 0.61–1.24)
GFR, Estimated: 31 mL/min — ABNORMAL LOW (ref >60)
GFR, Estimated: 36 mL/min — ABNORMAL LOW (ref >60)
Glucose, Bld: 243 mg/dL — ABNORMAL HIGH (ref 70–99)
Glucose, Bld: 235 mg/dL — ABNORMAL HIGH (ref 70–99)
Phosphorus: 3 mg/dL (ref 2.5–4.6)
Phosphorus: 3.1 mg/dL (ref 2.5–4.6)
Potassium: 4.9 mmol/L (ref 3.5–5.1)
Potassium: 4.7 mmol/L (ref 3.5–5.1)
Sodium: 134 mmol/L — ABNORMAL LOW (ref 135–145)
Sodium: 135 mmol/L (ref 135–145)

## 2024-07-24 LAB — MAGNESIUM: Magnesium: 2.1 mg/dL (ref 1.7–2.4)

## 2024-07-24 LAB — PHOSPHORUS: Phosphorus: 3.1 mg/dL (ref 2.5–4.6)

## 2024-07-24 LAB — HEPARIN LEVEL (UNFRACTIONATED): Heparin Unfractionated: 0.55 [IU]/mL (ref 0.30–0.70)

## 2024-07-24 MED ORDER — INSULIN GLARGINE-YFGN 100 UNIT/ML ~~LOC~~ SOLN
30.0000 [IU] | Freq: Every day | SUBCUTANEOUS | Status: DC
  Administered 2024-07-25: 30 [IU] via SUBCUTANEOUS
  Filled 2024-07-24 (×2): qty 0.3

## 2024-07-24 MED ORDER — BISACODYL 10 MG RE SUPP: 10.0000 mg | Freq: Every day | RECTAL | Status: DC | PRN

## 2024-07-24 MED ORDER — OXYCODONE HCL 5 MG PO TABS
2.5000 mg | ORAL_TABLET | Freq: Four times a day (QID) | ORAL | Status: AC | PRN
  Administered 2024-07-24 – 2024-07-25 (×2): 2.5 mg via ORAL
  Filled 2024-07-24 (×2): qty 1

## 2024-07-24 NOTE — Progress Notes (Signed)
 PHARMACY - ANTICOAGULATION CONSULT NOTE  Pharmacy Consult for heparin  Indication: pulmonary embolus  Allergies  Allergen Reactions   Penicillins Hives   Lucentis [Ranibizumab] Rash    Patient Measurements: Height: 6' 1 (185.4 cm) Weight: 81.4 kg (179 lb 7.3 oz) IBW/kg (Calculated) : 79.9 HEPARIN  DW (KG): 81.4  Vital Signs: Temp: 97.6 F (36.4 C) (11/02 0700) Temp Source: Axillary (11/02 0700) BP: 129/96 (11/02 1000) Pulse Rate: 63 (11/02 1000)  Labs: Recent Labs    07/22/24 0334 07/22/24 1605 07/23/24 0330 07/23/24 1600 07/24/24 0445 07/24/24 0732  HGB 6.8*  --  7.6*  --   --  7.8*  HCT 20.2*  --  23.1*  --   --  23.8*  PLT 129*  --  135*  --   --  148*  HEPARINUNFRC 0.29* 0.47 0.54  --  0.55  --   CREATININE 2.57* 2.36* 2.23* 2.33* 2.13*  --     Estimated Creatinine Clearance: 33.3 mL/min (A) (by C-G formula based on SCr of 2.13 mg/dL (H)).   Assessment: JC is a 30 YOM admitted with concern for spinal cord injury. Patient taken to the OR for neurosurgery, procedure complicated by multiple rounds of CPR.   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 S/p IR 10/27 for thrombectomy.  Heparin  level 0.55 is therapeutic with heparin  running at 1200 units/hr. Hgb (7.8) and PLTs (148) are stable. Per RN, no report of pauses, issues with the line. Continues to have stable oozing around his IV line.   Goal of Therapy:  Heparin  level 0.3-0.7 units/ml Monitor platelets by anticoagulation protocol: Yes   Plan:  Continue heparin  infusion at 1200 units/hr Monitor daily heparin  level, CBC, and signs/symptoms of bleeding F/u plans for transition to oral [facility]   Thank you for allowing pharmacy to be a part of this patient's care.   Nidia Schaffer, PharmD PGY2 Cardiology Pharmacy Resident  Please check AMION for all [facility] Pharmacy phone numbers After 10:00 PM, call Main Pharmacy [phone] 07/24/2024  10:11 AM

## 2024-07-24 NOTE — Progress Notes (Signed)
 Speech Language Pathology Treatment: Dysphagia  Patient Details Name: Lawrence [name] MRN: [id number] DOB: 04/11/1946 Today's Date: 07/24/2024 Time: 9099-9081 SLP Time Calculation (min) (ACUTE ONLY): 18 min  Assessment / Plan / Recommendation Clinical Impression  Pt was seen for skilled ST targeting PO readiness.  Pt was encountered lethargic in bed, but he was very pleasant and cooperative throughout this session.  Pt's voice was noted to be reduced in volume, but it was clear and noticeably improved from yesterday per chart review and per patient report. SLP completed oral care prior to PO trials and he was repositioned to a partially elevated position via reverse trendelenburg.  RN stated that pt was in significant pain after being fully upright for BSE yesterday so accommodations were made for this session.  Pt was seen with trials of ice chips x2.  He exhibited facial grimacing with both trials and suspect delayed swallow initiation; however, no overt s/sx of aspiration were observed with either trial which is an improvement from BSE yesterday.  Pt reported mild odynophagia with trials.  Recommend continuation of NPO at this time with frequent oral care; however, hopeful that pt will be appropriate for an instrumental swallow study in the next 24-48 hours if he continues to improve.  SLP will continue to follow closely.     HPI HPI: 78 yo admitted 10/26 s/p fall with onset of numbness in BLE. Neurosurgery consulted, pt taken to OR for partial anterior cervical discectomy but procedure aborted secondary to multiple PEA arrests, intubated. Pt with acute biventricular HF, anuric AKI requiring initiation of CRRT. ETT 10/26-28. Mechanical thrombectory for bilateral PE 10/27. Cortrak 10/29. PMH includes DM, HTN, BPH s/p TURP      SLP Plan  Continue with current plan of care          Recommendations  Diet recommendations: NPO Medication Administration: Via alternative means                   Oral care QID   Other (comment) (TBD) Dysphagia, unspecified (R13.10)     Continue with current plan of care    Lawrence [name], M.S., CCC-SLP Acute Rehabilitation Services Office: [phone]  Lawrence [name] [facility]  07/24/2024, 9:25 AM

## 2024-07-24 NOTE — Progress Notes (Signed)
 Lawrence [name] is an 78 y.o. male diabetes, hypertension, BPH status post TURP initially presenting after a ground-level fall about 5 days prior to admission. Of note over the past few weeks he has had neuropathy in his feet, felt more unsteady and used a cane because of his neuropathy.  Unfortunately he slipped and fell and hit his head on the sink followed by new onset numbness in his legs, inability to sit up and loss of function in his legs as well as urinary incontinence.  He was to have a partial anterior cervical discectomy C7-T1 to stabilize even though he was not likely to regain strength in his lower extremities but during the operative course he developed PEA arrest x 2 with subsequent CT scan showing a massive bilateral PE with poor lung perfusion.  Patient also had successful thrombectomy performed by VIR removing a large clot burden at the right PA bifurcation as well as a large clot burden in the left lower lung lobe. BL cr is approximately 0.9-1.1 (12/02/22) but with his complicated course he has developed renal failure with very poor urine output.  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 - He does not want long-term dialysis but is willing to give a trial with short-term dialysis to give his kidneys a chance to recover -Initially anuria with K trending up --> started CRRT on 10/29 mid afternoon. CVP 8-9 tolerating UF 50-100 ml/hr, no issues with filter clotting. + 3.2  L during hospitalization. Likely will be a prolonged course given significant renal injury, hopefully no cortical necrosis. Updated spouse who was bedside.  Had to change to 2K dialysate bath on 10/31 (K trending up), appears to be stabilizing; will increase UF goal 0-100 .   Phos repleted.  -Monitor Daily I/Os, Daily weight  -Maintain MAP>65 for optimal renal perfusion.  - Avoid nephrotoxic agents  such as IV contrast, NSAIDs, and phosphate containing bowel preps (FLEETS)   PE s/p at least 2 PEA arrests with ROSC was not a candidate for lytics because of surgery status post successful thrombectomy by VIR C7 fracture with paraplegia affecting the lower extremities which may not be reversible. Acute respiratory failure -off sedation and alert, and possible extubation trial pending weaning parameters Cardiogenic shock secondary to pulmonary embolus -off epinephrine, still on Levophed by cardiology  Subjective: Denies fever, chills, nausea, SOB; CVP 8-9    Chemistry and CBC: Creatinine  Date/Time Value Ref Range Status  02/21/2021 12:00 AM 1.2 0.6 - 1.3 Final  10/27/2020 12:00 AM 0.9 0.6 - 1.3 Final   Creatinine, Ser  Date/Time Value Ref Range Status  07/24/2024 04:45 AM 2.13 (H) 0.61 - 1.24 mg/dL Final  88/98/7974 95:99 PM 2.33 (H) 0.61 - 1.24 mg/dL Final  88/98/7974 96:69 AM 2.23 (H) 0.61 - 1.24 mg/dL Final  89/68/7974 95:94 PM 2.36 (H) 0.61 - 1.24 mg/dL Final  89/68/7974 96:65 AM 2.57 (H) 0.61 - 1.24 mg/dL Final  89/69/7974 95:49 PM 3.03 (H) 0.61 - 1.24 mg/dL Final  89/69/7974 95:83 AM 4.16 (H) 0.61 - 1.24 mg/dL Final  89/70/7974 95:60 PM 5.73 (H) 0.61 - 1.24 mg/dL Final  89/70/7974 94:86 AM 6.10 (H) 0.61 - 1.24 mg/dL Final  89/71/7974 90:99 PM 5.41 (H) 0.61 - 1.24 mg/dL Final  89/71/7974 97:46 PM 5.16 (H) 0.61 - 1.24 mg/dL Final  89/71/7974 98:44 AM 4.18 (H) 0.61 - 1.24 mg/dL Final  89/72/7974 95:83  PM 3.69 (H) 0.61 - 1.24 mg/dL Final  89/72/7974 95:82 AM 2.90 (H) 0.61 - 1.24 mg/dL Final  89/73/7974 90:59 PM 2.37 (H) 0.61 - 1.24 mg/dL Final  89/73/7974 97:48 PM 1.50 (H) 0.61 - 1.24 mg/dL Final  89/73/7974 95:74 AM 0.99 0.61 - 1.24 mg/dL Final  89/74/7974 92:40 PM 1.08 0.61 - 1.24 mg/dL Final  96/87/7975 89:41 AM 1.02 0.61 - 1.24 mg/dL Final  97/79/7976 98:95 PM 0.92 0.61 - 1.24 mg/dL Final  95/96/7977 93:65 PM 0.86 0.61 - 1.24 mg/dL Final   Recent Labs  Lab  07/21/24 0416 07/21/24 1650 07/22/24 0334 07/22/24 1605 07/23/24 0330 07/23/24 1600 07/24/24 0445  NA 134* 134* 135 136 133* 133* 134*  K 4.4 5.1 5.3* 4.9 5.1 5.3* 4.9  CL 100 101 100 100 100 100 100  CO2 22 23 24 24 23 23 23   GLUCOSE 263* 358* 283* 276* 291* 262* 243*  BUN 68* 57* 54* 48* 54* 53* 49*  CREATININE 4.16* 3.03* 2.57* 2.36* 2.23* 2.33* 2.13*  CALCIUM 7.5* 7.6* 7.7* 7.7* 8.0* 8.0* 8.5*  PHOS 4.0  4.2 2.4* 1.8*  1.8* 3.5 2.0*  1.8* 4.4 3.1  3.0   Recent Labs  Lab 07/21/24 0416 07/22/24 0334 07/23/24 0330 07/24/24 0732  WBC 10.6* 11.7* 14.3* 16.4*  HGB 7.8* 6.8* 7.6* 7.8*  HCT 22.2* 20.2* 23.1* 23.8*  MCV 91.0 94.4 94.3 96.0  PLT 131* 129* 135* 148*   Liver Function Tests: Recent Labs  Lab 07/18/24 0417 07/19/24 0155 07/19/24 2100 07/23/24 0330 07/23/24 1600 07/24/24 0445  AST 1,476* 622*  --   --   --   --   ALT 978* 673*  --   --   --   --   ALKPHOS 58 50  --   --   --   --   BILITOT 1.3* 2.3*  --   --   --   --   PROT 4.3* 3.9*  --   --   --   --   ALBUMIN 2.1* 1.9*   < > 2.0* 2.0* 2.0*   < > = values in this interval not displayed.   Recent Labs  Lab 07/18/24 0417  LIPASE 29   No results for input(s): AMMONIA in the last 168 hours. Cardiac Enzymes: No results for input(s): CKTOTAL, CKMB, CKMBINDEX, TROPONINI in the last 168 hours.  Iron Studies: No results for input(s): IRON, TIBC, TRANSFERRIN, FERRITIN in the last 72 hours. PT/INR: @LABRCNTIP (inr:5)  Xrays/Other Studies: ) Results for orders placed or performed during the hospital encounter of 07/16/24 (from the past 48 hours)  Glucose, capillary     Status: Abnormal   Collection Time: 07/22/24 11:28 AM  Result Value Ref Range   Glucose-Capillary 201 (H) 70 - 99 mg/dL    Comment: Glucose reference range applies only to samples taken after fasting for at least 8 hours.  Glucose, capillary     Status: Abnormal   Collection Time: 07/22/24  3:49 PM  Result Value  Ref Range   Glucose-Capillary 265 (H) 70 - 99 mg/dL    Comment: Glucose reference range applies only to samples taken after fasting for at least 8 hours.  Renal function panel (daily at 1600)     Status: Abnormal   Collection Time: 07/22/24  4:05 PM  Result Value Ref Range   Sodium 136 135 - 145 mmol/L   Potassium 4.9 3.5 - 5.1 mmol/L   Chloride 100 98 - 111 mmol/L   CO2 24 22 -  32 mmol/L   Glucose, Bld 276 (H) 70 - 99 mg/dL    Comment: Glucose reference range applies only to samples taken after fasting for at least 8 hours.   BUN 48 (H) 8 - 23 mg/dL   Creatinine, Ser 7.63 (H) 0.61 - 1.24 mg/dL   Calcium 7.7 (L) 8.9 - 10.3 mg/dL   Phosphorus 3.5 2.5 - 4.6 mg/dL   Albumin 2.1 (L) 3.5 - 5.0 g/dL   GFR, Estimated 28 (L) >60 mL/min    Comment: (NOTE) Calculated using the CKD-EPI Creatinine Equation (2021)    Anion gap 12 5 - 15    Comment: Performed at [facility], [address], [place], [place] [address]  Heparin  level (unfractionated)     Status: None   Collection Time: 07/22/24  4:05 PM  Result Value Ref Range   Heparin  Unfractionated 0.47 0.30 - 0.70 IU/mL    Comment: (NOTE) The clinical reportable range upper limit is being lowered to >1.10 to align with the FDA approved guidance for the current laboratory assay.  If heparin  results are below expected values, and patient dosage has  been confirmed, suggest follow up testing of antithrombin III levels. Performed at [facility], [address] [place], [place] [address]   Glucose, capillary     Status: Abnormal   Collection Time: 07/22/24  7:20 PM  Result Value Ref Range   Glucose-Capillary 195 (H) 70 - 99 mg/dL    Comment: Glucose reference range applies only to samples taken after fasting for at least 8 hours.  Glucose, capillary     Status: Abnormal   Collection Time: 07/22/24 11:19 PM  Result Value Ref Range   Glucose-Capillary 219 (H) 70 - 99 mg/dL    Comment: Glucose reference range applies only to  samples taken after fasting for at least 8 hours.  Glucose, capillary     Status: Abnormal   Collection Time: 07/23/24  3:11 AM  Result Value Ref Range   Glucose-Capillary 240 (H) 70 - 99 mg/dL    Comment: Glucose reference range applies only to samples taken after fasting for at least 8 hours.  CBC     Status: Abnormal   Collection Time: 07/23/24  3:30 AM  Result Value Ref Range   WBC 14.3 (H) 4.0 - 10.5 K/uL   RBC 2.45 (L) 4.22 - 5.81 MIL/uL   Hemoglobin 7.6 (L) 13.0 - 17.0 g/dL   HCT 76.8 (L) 60.9 - 47.9 %   MCV 94.3 80.0 - 100.0 fL   MCH 31.0 26.0 - 34.0 pg   MCHC 32.9 30.0 - 36.0 g/dL   RDW 83.3 (H) 88.4 - 84.4 %   Platelets 135 (L) 150 - 400 K/uL   nRBC 2.1 (H) 0.0 - 0.2 %    Comment: Performed at [facility], [address], [place], [place] [address]  Heparin  level (unfractionated)     Status: None   Collection Time: 07/23/24  3:30 AM  Result Value Ref Range   Heparin  Unfractionated 0.54 0.30 - 0.70 IU/mL    Comment: (NOTE) The clinical reportable range upper limit is being lowered to >1.10 to align with the FDA approved guidance for the current laboratory assay.  If heparin  results are below expected values, and patient dosage has  been confirmed, suggest follow up testing of antithrombin III levels. Performed at [facility], [address] [place], [place] [address]   Renal function panel (daily at 0500)  Status: Abnormal   Collection Time: 07/23/24  3:30 AM  Result Value Ref Range   Sodium 133 (L) 135 - 145 mmol/L   Potassium 5.1 3.5 - 5.1 mmol/L   Chloride 100 98 - 111 mmol/L   CO2 23 22 - 32 mmol/L   Glucose, Bld 291 (H) 70 - 99 mg/dL    Comment: Glucose reference range applies only to samples taken after fasting for at least 8 hours.   BUN 54 (H) 8 - 23 mg/dL   Creatinine, Ser 7.76 (H) 0.61 - 1.24 mg/dL   Calcium 8.0 (L) 8.9 - 10.3 mg/dL   Phosphorus 1.8 (L) 2.5 - 4.6 mg/dL   Albumin 2.0 (L) 3.5 - 5.0 g/dL   GFR, Estimated 30 (L) >60  mL/min    Comment: (NOTE) Calculated using the CKD-EPI Creatinine Equation (2021)    Anion gap 10 5 - 15    Comment: Performed at [facility], [address] [place], [place] [address]  Magnesium     Status: None   Collection Time: 07/23/24  3:30 AM  Result Value Ref Range   Magnesium 2.2 1.7 - 2.4 mg/dL    Comment: Performed at [facility], [address], [place], [place] [address]  Phosphorus     Status: Abnormal   Collection Time: 07/23/24  3:30 AM  Result Value Ref Range   Phosphorus 2.0 (L) 2.5 - 4.6 mg/dL    Comment: Performed at [facility], [address], [place], [place] [address]  Glucose, capillary     Status: Abnormal   Collection Time: 07/23/24  7:43 AM  Result Value Ref Range   Glucose-Capillary 204 (H) 70 - 99 mg/dL    Comment: Glucose reference range applies only to samples taken after fasting for at least 8 hours.  Glucose, capillary     Status: Abnormal   Collection Time: 07/23/24 11:47 AM  Result Value Ref Range   Glucose-Capillary 260 (H) 70 - 99 mg/dL    Comment: Glucose reference range applies only to samples taken after fasting for at least 8 hours.  Renal function panel (daily at 1600)     Status: Abnormal   Collection Time: 07/23/24  4:00 PM  Result Value Ref Range   Sodium 133 (L) 135 - 145 mmol/L   Potassium 5.3 (H) 3.5 - 5.1 mmol/L   Chloride 100 98 - 111 mmol/L   CO2 23 22 - 32 mmol/L   Glucose, Bld 262 (H) 70 - 99 mg/dL    Comment: Glucose reference range applies only to samples taken after fasting for at least 8 hours.   BUN 53 (H) 8 - 23 mg/dL   Creatinine, Ser 7.66 (H) 0.61 - 1.24 mg/dL   Calcium 8.0 (L) 8.9 - 10.3 mg/dL   Phosphorus 4.4 2.5 - 4.6 mg/dL   Albumin 2.0 (L) 3.5 - 5.0 g/dL   GFR, Estimated 28 (L) >60 mL/min    Comment: (NOTE) Calculated using the CKD-EPI Creatinine Equation (2021)    Anion gap 10 5 - 15    Comment: Performed at [facility], [address], [place], [place] [address]  Glucose,  capillary     Status: Abnormal   Collection Time: 07/23/24  4:46 PM  Result Value Ref Range   Glucose-Capillary 247 (H) 70 - 99 mg/dL    Comment: Glucose reference range applies only to samples taken after fasting for at least 8 hours.  Glucose, capillary  Status: Abnormal   Collection Time: 07/23/24  7:20 PM  Result Value Ref Range   Glucose-Capillary 261 (H) 70 - 99 mg/dL    Comment: Glucose reference range applies only to samples taken after fasting for at least 8 hours.  Glucose, capillary     Status: Abnormal   Collection Time: 07/23/24 11:03 PM  Result Value Ref Range   Glucose-Capillary 293 (H) 70 - 99 mg/dL    Comment: Glucose reference range applies only to samples taken after fasting for at least 8 hours.  Glucose, capillary     Status: Abnormal   Collection Time: 07/24/24  3:12 AM  Result Value Ref Range   Glucose-Capillary 188 (H) 70 - 99 mg/dL    Comment: Glucose reference range applies only to samples taken after fasting for at least 8 hours.  Heparin  level (unfractionated)     Status: None   Collection Time: 07/24/24  4:45 AM  Result Value Ref Range   Heparin  Unfractionated 0.55 0.30 - 0.70 IU/mL    Comment: (NOTE) The clinical reportable range upper limit is being lowered to >1.10 to align with the FDA approved guidance for the current laboratory assay.  If heparin  results are below expected values, and patient dosage has  been confirmed, suggest follow up testing of antithrombin III levels. Performed at [facility], [address] [place], [place] [address]   Renal function panel (daily at 0500)     Status: Abnormal   Collection Time: 07/24/24  4:45 AM  Result Value Ref Range   Sodium 134 (L) 135 - 145 mmol/L   Potassium 4.9 3.5 - 5.1 mmol/L   Chloride 100 98 - 111 mmol/L   CO2 23 22 - 32 mmol/L   Glucose, Bld 243 (H) 70 - 99 mg/dL    Comment: Glucose reference range applies only to samples taken after fasting for at least 8 hours.   BUN 49 (H) 8  - 23 mg/dL   Creatinine, Ser 7.86 (H) 0.61 - 1.24 mg/dL   Calcium 8.5 (L) 8.9 - 10.3 mg/dL   Phosphorus 3.0 2.5 - 4.6 mg/dL   Albumin 2.0 (L) 3.5 - 5.0 g/dL   GFR, Estimated 31 (L) >60 mL/min    Comment: (NOTE) Calculated using the CKD-EPI Creatinine Equation (2021)    Anion gap 11 5 - 15    Comment: Performed at [facility], [address], [place], [place] [address]  Magnesium     Status: None   Collection Time: 07/24/24  4:45 AM  Result Value Ref Range   Magnesium 2.1 1.7 - 2.4 mg/dL    Comment: Performed at [facility], [address] [place], [place] [address]  Phosphorus     Status: None   Collection Time: 07/24/24  4:45 AM  Result Value Ref Range   Phosphorus 3.1 2.5 - 4.6 mg/dL    Comment: Performed at [facility], [address], [place], [place] [address]  Glucose, capillary     Status: Abnormal   Collection Time: 07/24/24  7:17 AM  Result Value Ref Range   Glucose-Capillary 238 (H) 70 - 99 mg/dL    Comment: Glucose reference range applies only to samples taken after fasting for at least 8 hours.  CBC     Status: Abnormal   Collection Time: 07/24/24  7:32 AM  Result Value Ref Range   WBC 16.4 (H) 4.0 - 10.5 K/uL   RBC 2.48 (L) 4.22 - 5.81 MIL/uL  Hemoglobin 7.8 (L) 13.0 - 17.0 g/dL   HCT 76.1 (L) 60.9 - 47.9 %   MCV 96.0 80.0 - 100.0 fL   MCH 31.5 26.0 - 34.0 pg   MCHC 32.8 30.0 - 36.0 g/dL   RDW 83.4 (H) 88.4 - 84.4 %   Platelets 148 (L) 150 - 400 K/uL   nRBC 0.5 (H) 0.0 - 0.2 %    Comment: Performed at [facility], [address] [place], [place] [address]   No results found.   PMH:   Past Medical History:  Diagnosis Date   Arthritis    Diabetes mellitus without complication (HCC)    GERD (gastroesophageal reflux disease)    Hypertension    Sleep apnea     PSH:   Past Surgical History:  Procedure Laterality Date   ANTERIOR CERVICAL DECOMP/DISCECTOMY FUSION N/A 07/17/2024   Procedure: ANTERIOR CERVICAL  DECOMPRESSION/DISCECTOMY FUSION, CERVICAL SEVEN - THORACIC ONE;  Surgeon: Debby Dorn MATSU, MD;  Location: [facility] OR;  Service: Neurosurgery;  Laterality: N/A;  Partial Discecomy Cervical seven- Thoracic one   APPENDECTOMY     78 yo   BACK SURGERY  1975   lumbar diskectomy   done [facility]   COLONOSCOPY N/A 02/21/2015   Procedure: COLONOSCOPY;  Surgeon: Claudis RAYMOND Rivet, MD;  Location: [facility] ENDO SUITE;  Service: Endoscopy;  Laterality: N/A;  1025   COLONOSCOPY  11/13/2021   COLONOSCOPY WITH PROPOFOL  N/A 11/13/2021   Procedure: COLONOSCOPY WITH PROPOFOL ;  Surgeon: Rivet Claudis RAYMOND, MD;  Location: [facility] ENDO SUITE;  Service: Endoscopy;  Laterality: N/A;  155   CYSTOSCOPY WITH URETHRAL DILATATION N/A 12/09/2022   Procedure: CYSTOSCOPY WITH URETHRAL DILATATION;  Surgeon: Watt Rush, MD;  Location: [facility] ORS;  Service: Urology;  Laterality: N/A;   EYE SURGERY Bilateral    Lasik surgery   IR ANGIOGRAM PULMONARY BILATERAL SELECTIVE  07/18/2024   IR ANGIOGRAM SELECTIVE EACH ADDITIONAL VESSEL  07/18/2024   IR THROMBECT PRIM MECH ADD (INCLU) MOD SED  07/18/2024   IR THROMBECT PRIM MECH ADD (INCLU) MOD SED  07/18/2024   IR THROMBECT PRIM MECH ADD (INCLU) MOD SED  07/18/2024   LEFT HEART CATH AND CORONARY ANGIOGRAPHY N/A 12/23/2020   Procedure: LEFT HEART CATH AND CORONARY ANGIOGRAPHY;  Surgeon: Burnard Debby LABOR, MD;  Location: [facility] INVASIVE CV LAB;  Service: Cardiovascular;  Laterality: N/A;   NECK SURGERY  1999   Work injury, posterior fusion   POLYPECTOMY  11/13/2021   Procedure: POLYPECTOMY INTESTINAL;  Surgeon: Rivet Claudis RAYMOND, MD;  Location: [facility] END[facility];  Service: Endoscopy;;   SHOULDER SURGERY Bilateral 2000   rotoator cuff repair   done at [facility]   TONSILLECTOMY     TRANSURETHRAL INCISION OF PROSTATE N/A 12/09/2022   Procedure: TRANSURETHRAL RESECTION OF PROSTATE;  Surgeon: Watt Rush, MD;  Location: [facility] ORS;  Service: Urology;  Laterality: N/A;  1 HR FOR CASE    Allergies:  Allergies   Allergen Reactions   Penicillins Hives   Lucentis [Ranibizumab] Rash    Medications:   Prior to Admission medications   Medication Sig Start Date End Date Taking? Authorizing Provider  Apple Cider Vinegar 600 MG CAPS Take 600 mg by mouth daily.   Yes [provider]  aspirin  EC 81 MG tablet Take 1 tablet (81 mg total) by mouth every other day. 11/14/21  Yes Rehman, Claudis RAYMOND, MD  Barberry-Oreg Grape-Goldenseal (BERBERINE COMPLEX PO) Take 1 capsule by mouth daily.   Yes [provider]  cholecalciferol (VITAMIN D3) 25 MCG (1000 UNIT) tablet Take 1,000 Units by mouth daily.   Yes [provider]  Cinnamon 500 MG capsule Take 500 mg by mouth daily.   Yes [provider]  diphenhydrAMINE HCl (NERVINE PO) Take 1 tablet by mouth daily. Patient states that he takes one a day   Yes [provider]  empagliflozin (JARDIANCE) 10 MG TABS tablet Take 10 mg by mouth daily.   Yes [provider]  ferrous sulfate 325 (65 FE) MG tablet Take 325 mg by mouth daily with breakfast.   Yes [provider]  hydrochlorothiazide  (HYDRODIURIL ) 25 MG tablet Take 25 mg by mouth daily. 07/23/21  Yes [provider]  insulin  degludec (TRESIBA  FLEXTOUCH) 100 UNIT/ML FlexTouch Pen Inject 12 Units into the skin at bedtime. Patient taking differently: Inject 28 Units into the skin 2 (two) times daily. 10/07/22  Yes Reardon, Benton PARAS, NP  Multiple Vitamins-Minerals (OCUVITE ADULT 50+ PO) Take 1 tablet by mouth daily.   Yes [provider]  OVER THE COUNTER MEDICATION Take 1 capsule by mouth daily. Prostagenix   Yes [provider]  OVER THE COUNTER MEDICATION Take 1 capsule by mouth daily. Diabacore (Diabetic Supplement)   Yes [provider]  OVER THE COUNTER MEDICATION Take 1 tablet by mouth in the morning and at bedtime. Glucoceil   Yes [provider]  tadalafil  (CIALIS ) 5 MG tablet Take 1 tablet (5 mg total) by mouth  daily as needed for erectile dysfunction. 12/06/20  Yes Watt Rush, MD  vitamin E 180 MG (400 UNITS) capsule Take 400 Units by mouth daily.   Yes [provider]  Vitamin Mixture (ESTER-C PO) Take 1,000 mg by mouth daily.   Yes [provider]    Discontinued Meds:   Medications Discontinued During This Encounter  Medication Reason   0.9 %  sodium chloride  infusion    lactated ringers  infusion Patient Transfer   insulin  aspart (novoLOG ) injection 0-7 Units Patient Transfer   lidocaine -EPINEPHrine (XYLOCAINE  W/EPI) 1 %-1:100000 (with pres) injection Patient Transfer   0.9 % irrigation (POUR BTL) Patient Transfer   Surgifoam 1 Gm with Thrombin 5,000 units (5 ml) topical solution Patient Transfer   HYDROmorphone  (DILAUDID ) injection 0.25-0.5 mg Patient Transfer   droperidol (INAPSINE) 2.5 MG/ML injection 0.625 mg Patient Transfer   insulin  glargine-yfgn (SEMGLEE) injection 28 Units    midazolam  (VERSED ) 2 MG/2ML injection Returned to ADS   Thrombi-Pad 3X3 pad 1 each    insulin  aspart (novoLOG ) injection 0-6 Units    insulin  glargine-yfgn (SEMGLEE) injection 20 Units    phenylephrine (NEO-SYNEPHRINE) 20mg /NS 250mL premix infusion    heparin  ADULT infusion 100 units/mL (25000 units/250mL)    fentaNYL  (SUBLIMAZE ) injection 25-50 mcg    midazolam  PF (VERSED ) injection 4 mg    norepinephrine (LEVOPHED) 4mg  in 250mL (0.016 mg/mL) premix infusion    heparin  ADULT infusion 100 units/mL (25000 units/250mL)    vasopressin (PITRESSIN) 20 Units in 100 mL (0.2 unit/mL) infusion-*FOR SHOCK*    dexmedetomidine (PRECEDEX) 400 MCG/100ML (4 mcg/mL) infusion    midazolam  (VERSED ) 100 mg/100 mL (1 mg/mL) premix infusion    calcium chloride injection 1 g    Oral care mouth rinse    lactated ringers  infusion    fentaNYL  in NS 250mL (9mcg/ml) infusion-PREMIX    fentaNYL  (SUBLIMAZE ) bolus via infusion 25-100 mcg    EPINEPHrine (ADRENALIN) 5 mg in NS 250 mL (0.02 mg/mL) premix  infusion    midazolam  (  VERSED ) bolus via infusion 2 mg    insulin  regular, human (MYXREDLIN) 100 units/ 100 mL infusion    dextrose  50 % solution 0-50 mL    propofol  (DIPRIVAN ) 1000 MG/100ML infusion    lidocaine  (XYLOCAINE ) 1 % (with pres) injection 20 mL    glycopyrrolate (ROBINUL) injection 0.1 mg    heparin  injection 1,000-6,000 Units    ceFEPIme (MAXIPIME) 2 g in sodium chloride  0.9 % 100 mL IVPB    ceFEPIme (MAXIPIME) 2 g in sodium chloride  0.9 % 100 mL IVPB    feeding supplement (PROSource TF20) liquid 60 mL    insulin  aspart (novoLOG ) injection 0-9 Units    insulin  aspart (novoLOG ) injection 2 Units    midodrine (PROAMATINE) tablet 5 mg    midodrine (PROAMATINE) tablet 5 mg    pantoprazole  (PROTONIX ) injection 40 mg    polyethylene glycol (MIRALAX / GLYCOLAX) packet 17 g    insulin  aspart (novoLOG ) injection 4 Units    prismasol BGK 4/2.5 infusion    midodrine (PROAMATINE) tablet 5 mg    insulin  glargine-yfgn (SEMGLEE) injection 10 Units     Social History:  reports that he has never smoked. He has never used smokeless tobacco. He reports that he does not drink alcohol and does not use drugs.  Family History:  History reviewed. No pertinent family history.  Blood pressure (!) 148/43, pulse (!) 57, temperature 97.6 F (36.4 C), temperature source Axillary, resp. rate 15, height 6' 1 (1.854 m), weight 81.4 kg, SpO2 95%. Physical Exam: General appearance: alert, cooperative, and appears stated age Head: NCAT Neck: no adenopathy, brace Resp: CTA b/l Cardio: regular rate and rhythm GI: SNDNT Extremities: tr edema Pulses: 2+ and symmetric     Rosealynn Mateus, LYNWOOD ORN, MD 07/24/2024, 8:59 AM

## 2024-07-24 NOTE — Progress Notes (Signed)
 NAME:  Lawrence [name], MRN:  [id number], DOB:  1945/11/01, LOS: 7 ADMISSION DATE:  07/16/2024, CONSULTATION DATE: 07/17/2024 REFERRING MD:  Debby Carrier , CHIEF COMPLAINT: Status postcardiac arrest  History of Present Illness:  78 year old male with diabetes, hypertension, BPH status post TURP who presented after having a ground-level fall with recent onset of numbness in his lower extremity- complete loss of sensation to BLE & urinary incontinence after fall.      Neurosurgery was consulted, patient mental OR for partial anterior cervical discectomy C7-T1 but course was complicated with multiple time PEA cardiac arrest in OR, procedure was aborted, TEE was done which showed severe RV dysfunction and dilatation with moderate to severe LV dysfunction, patient was transferred to ICU for close monitoring.  In ICU patient blood pressure started dropping and he went into PEA cardiac arrest again, ROSC was achieved after 2 minutes.  Unfortunately due to surgery, it was recommended no TNK/TPA.  Due to tenuous status and concern for re-arrest it was decided to hold on suction thrombectomy at the time.   Pertinent  Medical History   Past Medical History:  Diagnosis Date   Arthritis    Diabetes mellitus without complication (HCC)    GERD (gastroesophageal reflux disease)    Hypertension    Sleep apnea     Significant Hospital Events: Including procedures, antibiotic start and stop dates in addition to other pertinent events   Admitted and PCCM consulted, coded multiple times in OR (PEA) 07/18/2024 mechanical thrombectomy was done for bilateral pulmonary embolism. 10/28 nephro consult 10/29 on 5 NE, plan for HD cath placement and CRRT  10/30 add midodrine   Interim History / Subjective:  No new issues today.  Continues on CRRT.  Oral secretions are less.  He is alert and oriented.  On room air.  Remains oliguric.  Patient was very somnolent oxycodone  5 mg yesterday.  Dose decreased to 2.5 mg as  needed today.  Objective    Blood pressure (!) 133/47, pulse 64, temperature 97.6 F (36.4 C), temperature source Axillary, resp. rate 17, height 6' 1 (1.854 m), weight 81.4 kg, SpO2 96%. CVP:  [2 mmHg-24 mmHg] 2 mmHg      Intake/Output Summary (Last 24 hours) at 07/24/2024 1546 Last data filed at 07/24/2024 1500 Gross per 24 hour  Intake 2043.74 ml  Output 3939.5 ml  Net -1895.76 ml   Filed Weights   07/22/24 0425 07/23/24 0323 07/24/24 0500  Weight: 84.5 kg 82 kg 81.4 kg    Examination:  Physical exam: General: Alert lying in bed c-collar in place no distress.  On room air. HEENT: [place]/AT, eyes anicteric.c-collar in place  Neuro: Alert, awake following commands does not move lower extremity able to move upper extremities. Chest: Clear lungs bilaterally.  No wheezes or rhonchi Heart: Regular rate and rhythm, no murmurs or gallops S1-S2 heard no S3 no murmur Abdomen: Soft, nontender, nondistended, bowel sounds presen   Resolved problem list  Lactic acidosis  Endotracheally intubated  Assessment and Plan   PEA arrest Massive PE s/p thrombectomy  Acute BiV HF - improving Hypotension  -Status post mechanical thrombectomy on 07/18/2024 P - On midodrine off norepinephrine. - hep gtt   Acute hypoxic hypercarbic resp failure Rib fx, traumatic Bilat PNA OSA  P - On room air goal SpO2 > 92 -pulm hygiene  -status post 5 days of Rocephin.  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 Melia of nephrology plans to continue CRRT until mid next week.  Elevated LFTs, likely shock liver resolved. -PRN LFTs    Anemia - Hemoglobin is 7.8 today.    DM w  Hyperglycemia -incr EN coverage 10/31 cont SSI and basal  - Increase long acting insulin  to 30 units today.  Inadequate PO intake -adv EN per RDN -SLP consult--[place] with SLP today patient to remain NPO.  Labs   CBC: Recent Labs  Lab 07/20/24 0513 07/20/24 1129 07/20/24 1725 07/21/24 0416 07/22/24 0334 07/23/24 0330 07/24/24 0732  WBC 12.4*  --   --  10.6* 11.7* 14.3* 16.4*  HGB 7.0*   < > 7.9* 7.8* 6.8* 7.6* 7.8*  HCT 20.8*   < > 23.3* 22.2* 20.2* 23.1* 23.8*  MCV 95.9  --   --  91.0 94.4 94.3 96.0  PLT 123*  --   --  131* 129* 135* 148*   < > = values in this interval not displayed.    Basic Metabolic Panel: Recent Labs  Lab 07/18/24 1616 07/18/24 1707 07/21/24 0416 07/21/24 1650 07/22/24 0334 07/22/24 1605 07/23/24 0330 07/23/24 1600 07/24/24 0445  NA 138   < > 134*   < > 135 136 133* 133* 134*  K 3.2*   < > 4.4   < > 5.3* 4.9 5.1 5.3* 4.9  CL 103   < > 100   < > 100 100 100 100 100  CO2 19*   < > 22   < > 24 24 23 23 23   GLUCOSE 144*   < > 263*   < > 283* 276* 291* 262* 243*  BUN 50*   < > 68*   < > 54* 48* 54* 53* 49*  CREATININE 3.69*   < > 4.16*   < > 2.57* 2.36* 2.23* 2.33* 2.13*  CALCIUM 8.3*   < > 7.5*   < > 7.7* 7.7* 8.0* 8.0* 8.5*  MG 1.9  --  2.3  --  2.3  --  2.2  --  2.1  PHOS  --    < > 4.0  4.2   < > 1.8*  1.8* 3.5 2.0*  1.8* 4.4 3.1  3.0   < > = values in this interval not displayed.   GFR: Estimated Creatinine Clearance: 33.3 mL/min (A) (by C-G formula based on SCr of 2.13 mg/dL (H)). Recent Labs  Lab 07/17/24 1756 07/17/24 2140 07/18/24 0417 07/18/24 1616 07/19/24 0155 07/20/24 0513 07/21/24 0416 07/22/24 0334 07/23/24 0330 07/24/24 0732  WBC  --    < > 16.1* 16.4* 14.0*   < > 10.6* 11.7* 14.3* 16.4*  LATICACIDVEN 8.6*  --  4.6* 3.2* 1.4  --   --   --   --   --    < > = values in this interval not displayed.    Liver Function Tests: Recent Labs  Lab 07/18/24 0417 07/19/24 0155 07/19/24 2100 07/22/24 0334  07/22/24 1605 07/23/24 0330 07/23/24 1600 07/24/24 0445  AST 1,476* 622*  --   --   --   --   --   --   ALT 978* 673*  --   --   --   --   --   --   ALKPHOS 58 50  --   --   --   --   --   --  BILITOT 1.3* 2.3*  --   --   --   --   --   --   PROT 4.3* 3.9*  --   --   --   --   --   --   ALBUMIN 2.1* 1.9*   < > 1.8* 2.1* 2.0* 2.0* 2.0*   < > = values in this interval not displayed.   Recent Labs  Lab 07/18/24 0417  LIPASE 29   No results for input(s): AMMONIA in the last 168 hours.  ABG    Component Value Date/Time   PHART 7.461 (H) 07/19/2024 0113   PCO2ART 29.2 (L) 07/19/2024 0113   PO2ART 158 (H) 07/19/2024 0113   HCO3 20.8 07/19/2024 0113   TCO2 22 07/19/2024 0113   ACIDBASEDEF 3.0 (H) 07/19/2024 0113   O2SAT 63.1 07/20/2024 1200     Coagulation Profile: Recent Labs  Lab 07/17/24 2140  INR 1.7*    Cardiac Enzymes: No results for input(s): CKTOTAL, CKMB, CKMBINDEX, TROPONINI in the last 168 hours.   HbA1C: Hemoglobin A1C  Date/Time Value Ref Range Status  07/23/2021 12:00 AM 11.9  Final  10/27/2020 12:00 AM 9.3  Final   HbA1c, POC (controlled diabetic range)  Date/Time Value Ref Range Status  11/19/2021 11:44 AM 10.2 (A) 0.0 - 7.0 % Final   HbA1c POC (<> result, manual entry)  Date/Time Value Ref Range Status  03/18/2022 03:21 PM 10.8 4.0 - 5.6 % Final   Hgb A1c MFr Bld  Date/Time Value Ref Range Status  07/17/2024 04:25 AM 12.3 (H) 4.8 - 5.6 % Final    Comment:    (NOTE) Diagnosis of Diabetes The following HbA1c ranges recommended by the American Diabetes Association (ADA) may be used as an aid in the diagnosis of diabetes mellitus.  Hemoglobin             Suggested A1C NGSP%              Diagnosis  <5.7                   Non Diabetic  5.7-6.4                Pre-Diabetic  >6.4                   Diabetic  <7.0                   Glycemic control for                       adults with diabetes.    12/02/2022 11:30 AM 11.9 (H)  4.8 - 5.6 % Final    Comment:    (NOTE)         Prediabetes: 5.7 - 6.4         Diabetes: >6.4         Glycemic control for adults with diabetes: <7.0     CBG: Recent Labs  Lab 07/23/24 1920 07/23/24 2303 07/24/24 0312 07/24/24 0717 07/24/24 1148  GLUCAP 261* 293* 188* 238* 280*    CRITICAL CARE Performed by: Tamela Stakes   Total critical care time: 35 minutes  Tamela Stakes, MD  Attending Physician, Critical Care Medicine [facility] See Amion for pager If no response to pager, please call [phone] until 7pm After 7pm, Please call E-link [phone]

## 2024-07-25 DIAGNOSIS — I469 Cardiac arrest, cause unspecified: Secondary | ICD-10-CM | POA: Diagnosis not present

## 2024-07-25 DIAGNOSIS — R578 Other shock: Secondary | ICD-10-CM | POA: Diagnosis not present

## 2024-07-25 DIAGNOSIS — D649 Anemia, unspecified: Secondary | ICD-10-CM

## 2024-07-25 DIAGNOSIS — R1310 Dysphagia, unspecified: Secondary | ICD-10-CM

## 2024-07-25 DIAGNOSIS — S14107D Unspecified injury at C7 level of cervical spinal cord, subsequent encounter: Secondary | ICD-10-CM | POA: Diagnosis not present

## 2024-07-25 DIAGNOSIS — J9601 Acute respiratory failure with hypoxia: Secondary | ICD-10-CM | POA: Diagnosis not present

## 2024-07-25 LAB — CBC
HCT: 24.8 % — ABNORMAL LOW (ref 39.0–52.0)
Hemoglobin: 8.1 g/dL — ABNORMAL LOW (ref 13.0–17.0)
MCH: 31.6 pg (ref 26.0–34.0)
MCHC: 32.7 g/dL (ref 30.0–36.0)
MCV: 96.9 fL (ref 80.0–100.0)
Platelets: 186 K/uL (ref 150–400)
RBC: 2.56 MIL/uL — ABNORMAL LOW (ref 4.22–5.81)
RDW: 16.4 % — ABNORMAL HIGH (ref 11.5–15.5)
WBC: 15.9 K/uL — ABNORMAL HIGH (ref 4.0–10.5)
nRBC: 0.2 % (ref 0.0–0.2)

## 2024-07-25 LAB — GLUCOSE, CAPILLARY
Glucose-Capillary: 135 mg/dL — ABNORMAL HIGH (ref 70–99)
Glucose-Capillary: 177 mg/dL — ABNORMAL HIGH (ref 70–99)
Glucose-Capillary: 236 mg/dL — ABNORMAL HIGH (ref 70–99)
Glucose-Capillary: 251 mg/dL — ABNORMAL HIGH (ref 70–99)
Glucose-Capillary: 264 mg/dL — ABNORMAL HIGH (ref 70–99)
Glucose-Capillary: 293 mg/dL — ABNORMAL HIGH (ref 70–99)

## 2024-07-25 LAB — RENAL FUNCTION PANEL
Albumin: 2 g/dL — ABNORMAL LOW (ref 3.5–5.0)
Albumin: 2.1 g/dL — ABNORMAL LOW (ref 3.5–5.0)
Anion gap: 13 (ref 5–15)
Anion gap: 12 (ref 5–15)
BUN: 47 mg/dL — ABNORMAL HIGH (ref 8–23)
BUN: 51 mg/dL — ABNORMAL HIGH (ref 8–23)
CO2: 23 mmol/L (ref 22–32)
CO2: 22 mmol/L (ref 22–32)
Calcium: 8.8 mg/dL — ABNORMAL LOW (ref 8.9–10.3)
Calcium: 9 mg/dL (ref 8.9–10.3)
Chloride: 98 mmol/L (ref 98–111)
Chloride: 100 mmol/L (ref 98–111)
Creatinine, Ser: 1.86 mg/dL — ABNORMAL HIGH (ref 0.61–1.24)
Creatinine, Ser: 1.81 mg/dL — ABNORMAL HIGH (ref 0.61–1.24)
GFR, Estimated: 37 mL/min — ABNORMAL LOW (ref >60)
GFR, Estimated: 38 mL/min — ABNORMAL LOW (ref >60)
Glucose, Bld: 256 mg/dL — ABNORMAL HIGH (ref 70–99)
Glucose, Bld: 266 mg/dL — ABNORMAL HIGH (ref 70–99)
Phosphorus: 3.1 mg/dL (ref 2.5–4.6)
Phosphorus: 2.6 mg/dL (ref 2.5–4.6)
Potassium: 4.6 mmol/L (ref 3.5–5.1)
Potassium: 4.5 mmol/L (ref 3.5–5.1)
Sodium: 134 mmol/L — ABNORMAL LOW (ref 135–145)
Sodium: 134 mmol/L — ABNORMAL LOW (ref 135–145)

## 2024-07-25 LAB — HEPATIC FUNCTION PANEL
ALT: 128 U/L — ABNORMAL HIGH (ref 0–44)
AST: 31 U/L (ref 15–41)
Albumin: 2.1 g/dL — ABNORMAL LOW (ref 3.5–5.0)
Alkaline Phosphatase: 79 U/L (ref 38–126)
Bilirubin, Direct: 0.2 mg/dL (ref 0.0–0.2)
Indirect Bilirubin: 0.8 mg/dL (ref 0.3–0.9)
Total Bilirubin: 1 mg/dL (ref 0.0–1.2)
Total Protein: 5.7 g/dL — ABNORMAL LOW (ref 6.5–8.1)

## 2024-07-25 LAB — HEPARIN LEVEL (UNFRACTIONATED): Heparin Unfractionated: 0.56 [IU]/mL (ref 0.30–0.70)

## 2024-07-25 LAB — MAGNESIUM: Magnesium: 2 mg/dL (ref 1.7–2.4)

## 2024-07-25 MED ORDER — GLUCERNA 1.5 CAL PO LIQD
1000.0000 mL | ORAL | Status: DC
  Administered 2024-07-25 – 2024-07-26 (×2): 1000 mL
  Filled 2024-07-25 (×2): qty 1000

## 2024-07-25 MED ORDER — INSULIN ASPART 100 UNIT/ML IJ SOLN
8.0000 [IU] | INTRAMUSCULAR | Status: DC
  Administered 2024-07-25 – 2024-07-31 (×31): 8 [IU] via SUBCUTANEOUS
  Filled 2024-07-25 (×20): qty 8

## 2024-07-25 MED ORDER — INSULIN GLARGINE-YFGN 100 UNIT/ML ~~LOC~~ SOLN
40.0000 [IU] | Freq: Every day | SUBCUTANEOUS | Status: DC
  Administered 2024-07-26: 40 [IU] via SUBCUTANEOUS
  Filled 2024-07-25 (×2): qty 0.4

## 2024-07-25 MED ORDER — MIDODRINE HCL 5 MG PO TABS: 5.0000 mg | ORAL_TABLET | Freq: Three times a day (TID) | ORAL | Status: DC

## 2024-07-25 MED ORDER — INSULIN ASPART 100 UNIT/ML IJ SOLN
0.0000 [IU] | INTRAMUSCULAR | Status: DC
  Administered 2024-07-25: 4 [IU] via SUBCUTANEOUS
  Administered 2024-07-25: 11 [IU] via SUBCUTANEOUS
  Administered 2024-07-25: 3 [IU] via SUBCUTANEOUS
  Administered 2024-07-26 (×2): 4 [IU] via SUBCUTANEOUS
  Administered 2024-07-26: 3 [IU] via SUBCUTANEOUS
  Administered 2024-07-26 (×2): 4 [IU] via SUBCUTANEOUS
  Administered 2024-07-27: 7 [IU] via SUBCUTANEOUS
  Administered 2024-07-27 (×2): 4 [IU] via SUBCUTANEOUS
  Administered 2024-07-27: 7 [IU] via SUBCUTANEOUS
  Administered 2024-07-27: 3 [IU] via SUBCUTANEOUS
  Administered 2024-07-27 – 2024-07-28 (×2): 4 [IU] via SUBCUTANEOUS
  Administered 2024-07-28: 3 [IU] via SUBCUTANEOUS
  Administered 2024-07-28: 4 [IU] via SUBCUTANEOUS
  Administered 2024-07-28: 3 [IU] via SUBCUTANEOUS
  Administered 2024-07-28: 4 [IU] via SUBCUTANEOUS
  Administered 2024-07-29: 3 [IU] via SUBCUTANEOUS
  Administered 2024-07-29: 7 [IU] via SUBCUTANEOUS
  Administered 2024-07-29 (×2): 4 [IU] via SUBCUTANEOUS
  Administered 2024-07-30: 3 [IU] via SUBCUTANEOUS
  Administered 2024-07-30 (×2): 4 [IU] via SUBCUTANEOUS
  Administered 2024-07-30: 3 [IU] via SUBCUTANEOUS
  Administered 2024-07-31: 4 [IU] via SUBCUTANEOUS
  Administered 2024-07-31: 3 [IU] via SUBCUTANEOUS
  Administered 2024-08-01: 4 [IU] via SUBCUTANEOUS
  Administered 2024-08-01: 3 [IU] via SUBCUTANEOUS
  Administered 2024-08-02 (×3): 4 [IU] via SUBCUTANEOUS
  Filled 2024-07-25: qty 4
  Filled 2024-07-25: qty 5
  Filled 2024-07-25: qty 7
  Filled 2024-07-25: qty 4
  Filled 2024-07-25: qty 3
  Filled 2024-07-25: qty 15
  Filled 2024-07-25: qty 7
  Filled 2024-07-25: qty 3
  Filled 2024-07-25 (×4): qty 4
  Filled 2024-07-25: qty 3
  Filled 2024-07-25: qty 12
  Filled 2024-07-25 (×3): qty 4
  Filled 2024-07-25: qty 3
  Filled 2024-07-25: qty 4

## 2024-07-25 MED ORDER — INSULIN GLARGINE-YFGN 100 UNIT/ML ~~LOC~~ SOLN
10.0000 [IU] | Freq: Once | SUBCUTANEOUS | Status: AC
  Administered 2024-07-25: 10 [IU] via SUBCUTANEOUS
  Filled 2024-07-25: qty 0.1

## 2024-07-25 NOTE — Procedures (Signed)
 Objective Swallowing Evaluation: Type of Study: FEES-Fiberoptic Endoscopic Evaluation of Swallow   Patient Details  Name: Lawrence [name] MRN: [id number] Date of Birth: 1946-05-17  Today's Date: 07/25/2024 Time: SLP Start Time (ACUTE ONLY): 0933 -SLP Stop Time (ACUTE ONLY): 1000  SLP Time Calculation (min) (ACUTE ONLY): 27 min   Past Medical History:  Past Medical History:  Diagnosis Date   Arthritis    Diabetes mellitus without complication (HCC)    GERD (gastroesophageal reflux disease)    Hypertension    Sleep apnea    Past Surgical History:  Past Surgical History:  Procedure Laterality Date   ANTERIOR CERVICAL DECOMP/DISCECTOMY FUSION N/A 07/17/2024   Procedure: ANTERIOR CERVICAL DECOMPRESSION/DISCECTOMY FUSION, CERVICAL SEVEN - THORACIC ONE;  Surgeon: Debby Dorn MATSU, MD;  Location: [facility] OR;  Service: Neurosurgery;  Laterality: N/A;  Partial Discecomy Cervical seven- Thoracic one   APPENDECTOMY     78 yo   BACK SURGERY  1975   lumbar diskectomy   done [facility]   COLONOSCOPY N/A 02/21/2015   Procedure: COLONOSCOPY;  Surgeon: Claudis RAYMOND Rivet, MD;  Location: [facility];  Service: Endoscopy;  Laterality: N/A;  1025   COLONOSCOPY  11/13/2021   COLONOSCOPY WITH PROPOFOL  N/A 11/13/2021   Procedure: COLONOSCOPY WITH PROPOFOL ;  Surgeon: Rivet Claudis RAYMOND, MD;  Location: [facility];  Service: Endoscopy;  Laterality: N/A;  155   CYSTOSCOPY WITH URETHRAL DILATATION N/A 12/09/2022   Procedure: CYSTOSCOPY WITH URETHRAL DILATATION;  Surgeon: Watt Rush, MD;  Location: [facility] ORS;  Service: Urology;  Laterality: N/A;   EYE SURGERY Bilateral    Lasik surgery   IR ANGIOGRAM PULMONARY BILATERAL SELECTIVE  07/18/2024   IR ANGIOGRAM SELECTIVE EACH ADDITIONAL VESSEL  07/18/2024   IR THROMBECT PRIM MECH ADD (INCLU) MOD SED  07/18/2024   IR THROMBECT PRIM MECH ADD (INCLU) MOD SED  07/18/2024   IR THROMBECT PRIM MECH ADD (INCLU) MOD SED  07/18/2024   LEFT HEART CATH AND CORONARY  ANGIOGRAPHY N/A 12/23/2020   Procedure: LEFT HEART CATH AND CORONARY ANGIOGRAPHY;  Surgeon: Burnard Debby LABOR, MD;  Location: [facility] INVAS[facility];  Service: Cardiovascular;  Laterality: N/A;   NECK SURGERY  1999   Work injury, posterior fusion   POLYPECTOMY  11/13/2021   Procedure: POLYPECTOMY INTESTINAL;  Surgeon: Rivet Claudis RAYMOND, MD;  Location: [facility];  Service: Endoscopy;;   SHOULDER SURGERY Bilateral 2000   rotoator cuff repair   done at [facility]   TONSILLECTOMY     TRANSURETHRAL INCISION OF PROSTATE N/A 12/09/2022   Procedure: TRANSURETHRAL RESECTION OF PROSTATE;  Surgeon: Watt Rush, MD;  Location: [facility] OR[facility];  Service: Urology;  Laterality: N/A;  1 HR FOR CASE   HPI: 78 yo admitted 10/26 s/p fall with onset of numbness in BLE. Neurosurgery consulted, pt taken to OR for partial anterior cervical discectomy but procedure aborted secondary to multiple PEA arrests, intubated. Pt with acute biventricular HF, anuric AKI requiring initiation of CRRT. ETT 10/26-28. Mechanical thrombectory for bilateral PE 10/27. Cortrak 10/29. PMH includes DM, HTN, BPH s/p TURP   Subjective: awake but weak and drowsy, mother in room    Assessment / Plan / Recommendation     07/25/2024   11:22 AM  Clinical Impressions  Clinical Impression Patient participated fully in FEES at bedside with SLP graduated student feeding patient. His voice is very low in intensity, cough is very weak but he was able to expectorate a small amount of secretions (clearish-white). FEES scope was passed  transnasally through right nare and positioned in superior view of pharynx, larynx and trachea.Clear, frothy secretions observed in pyriform sinuses bilaterally, as well as in interarytenoid space. Right arytenoid appeared edematous, suspect hematoma of left vocal cord, purple bruising in left side of laryngeal vestibule. Appearance of vocal cord bowing with approximation bilaterally of vocal process of arytenoid cartilage but with  incomplete glottic closure. Persistent penetration to the vocal cords occured with secretions coming from pyriform sinuses as well as interarytenoid space and secretions mixed with PO's (ice chips and nectar thick liquids via spoon were the only PO's attempted). No green dyed PO's or secretions visualized below vocal cords at level of first rings of the trachea. Patient's weak cough only effective to expel secetions from vocal cords but ineffective to clear laryngeal vestibule. SLP is recommending continue NPO but allow small amounts of ice chips PRN.  SLP Visit Diagnosis --  Attention and concentration deficit following --  Frontal lobe and executive function deficit following --  Impact on safety and function --         07/25/2024   10:53 AM  Treatment Recommendations  Treatment Recommendations Therapy as outlined in treatment plan below        07/25/2024   11:43 AM  Prognosis  Prognosis for improved oropharyngeal function Fair  Barriers to Reach Goals Severity of deficits  Barriers/Prognosis Comment --       07/25/2024   10:53 AM  Diet Recommendations  SLP Diet Recommendations Ice chips PRN after oral care  Liquid Administration via --  Medication Administration Via alternative means  Compensations --  Postural Changes --         07/25/2024   10:53 AM  Other Recommendations  Recommended Consults --  Oral Care Recommendations Oral care QID;Oral care before and after PO;Staff/trained caregiver to provide oral care  Caregiver Recommendations Have oral suction available  Follow Up Recommendations Acute inpatient rehab (3hours/day)  Assistance recommended at discharge --  Functional Status Assessment Patient has had a recent decline in their functional status and demonstrates the ability to make significant improvements in function in a reasonable and predictable amount of time.       07/25/2024   10:53 AM  Frequency and Duration   Speech Therapy Frequency (ACUTE ONLY) min  3x week  Treatment Duration 2 weeks         07/25/2024   10:51 AM  Oral Phase  Oral Phase Impaired  Oral - Pudding Teaspoon --  Oral - Pudding Cup --  Oral - Honey Teaspoon --  Oral - Honey Cup --  Oral - Nectar Teaspoon Weak lingual manipulation;Reduced posterior propulsion;Delayed oral transit       07/25/2024   10:52 AM  Pharyngeal Phase  Pharyngeal Phase Impaired  Pharyngeal- Nectar Teaspoon Delayed swallow initiation-vallecula;Reduced epiglottic inversion;Reduced airway/laryngeal closure;Reduced tongue base retraction;Penetration/Aspiration during swallow;Penetration/Apiration after swallow;Pharyngeal residue - pyriform;Pharyngeal residue - posterior pharnyx;Inter-arytenoid space residue  Pharyngeal Material enters airway, CONTACTS cords and not ejected out;Material enters airway, CONTACTS cords and then ejected out         No data to display          Norleen IVAR Blase, MA, CCC-SLP Speech Therapy

## 2024-07-25 NOTE — Progress Notes (Signed)
{  CHL NUTRITION NOTE UBEZ:76535}  DOCUMENTATION CODES:   Not applicable  INTERVENTION:  ***   NUTRITION DIAGNOSIS:   Inadequate oral intake related to acute illness as evidenced by NPO status.  ***  GOAL:   Patient will meet greater than or equal to 90% of their needs  ***  MONITOR:   Vent status, TF tolerance, Labs, Weight trends, Skin  REASON FOR ASSESSMENT:   Consult Assessment of nutrition requirement/status (CRRT)  ASSESSMENT:   78 yo admitted post fall with onset of numbness in BLE. Neurosurgery consulted, pt taken to OR for partial anterior cervical discectomy but procedure aborted secondary to multiple PEA arrests, intubated. Pt with acute biventricular HF, anuric AKI requiring initiation of CRRT PMH includes DM, HTN, BPH s/p TURP  ***   NUTRITION - FOCUSED PHYSICAL EXAM:  {RD Focused Exam List:21252}  Diet Order:   Diet Order             Diet NPO time specified Except for: Ice Chips, Sips with Meds  Diet effective now                   EDUCATION NEEDS:   Not appropriate for education at this time  Skin:  Skin Assessment: Reviewed RN Assessment  Last BM:  10/30 - type 6/7 x1 (small)  Height:   Ht Readings from Last 1 Encounters:  07/25/24 6' 1 (1.854 m)    Weight:   Wt Readings from Last 1 Encounters:  07/25/24 81.8 kg    Ideal Body Weight:  83.6 kg  BMI:  Body mass index is 23.79 kg/m.  Estimated Nutritional Needs:   Kcal:  2100-2300 kcals  Protein:  145-165 g  Fluid:  1.5 L    ***

## 2024-07-25 NOTE — Progress Notes (Signed)
 Occupational Therapy Treatment Patient Details Name: Lawrence [name] MRN: [id number] DOB: 1945-10-22 Today's Date: 07/25/2024   History of present illness Pt is a 77 y.o. male who presented 07/16/24 s/p fall in which he hit his neck and head. MRI showed cord signal change C7-T1 concerning for spinal cord infarct or possibly transverse myelitis. Imaging also showed hairline fracture of the T1 body, mild PLC edema without disruption. S/p aborted partial anterior cervical discectomy C7-T1 due to pt going into PEA cardiac arrest 10/26. Went into PEA cardiac arrest again in ICU. Pt found to have large bil PE. S/p mechanical thrombectomy 10/27. ETT 10/26-10/28. Started CRRT 10/29. PMH: arthritis, DM, GERD, HTN, sleep apnea, BPH status post TURP   OT comments  Pt progressing toward goals, pt cleared for EOB mobility by RN/MD. Pt total A +2 for all aspects of bed mobility, tolerates sitting EOB ~25 mins for oral care, seated therex, and bathing tasks. Pt with BP stable, able to bring hand to mouth with support at elbow, able to touch thumb to 2nd digit, but unable to grasp suction tubing without HOH assist. Pt presenting with impairments listed below, will follow acutely. Patient will benefit from intensive inpatient follow-up therapy, >3 hours/day to maximize safety/ind with ADL/functional mobility.       If plan is discharge home, recommend the following:  Two people to help with walking and/or transfers;Two people to help with bathing/dressing/bathroom;Assistance with cooking/housework;Assistance with feeding;Direct supervision/assist for medications management;Direct supervision/assist for financial management;Assist for transportation;Help with stairs or ramp for entrance   Equipment Recommendations  Wheelchair (measurements OT);Wheelchair cushion (measurements OT);Hospital bed;Hoyer lift    Recommendations for Other Services      Precautions / Restrictions Precautions Precautions:  Fall;Cervical Precaution/Restrictions Comments: CRRT, A-line, MAP > 65, cardiac arrest x2 this admission Required Braces or Orthoses: Cervical Brace Cervical Brace: Hard collar;At all times Restrictions Weight Bearing Restrictions Per Provider Order: No       Mobility Bed Mobility Overal bed mobility: Needs Assistance Bed Mobility: Sidelying to Sit, Sit to Supine Rolling: Total assist, +2 for physical assistance Sidelying to sit: Total assist, +2 for physical assistance   Sit to supine: Total assist, +2 for physical assistance        Transfers                   General transfer comment: deferred     Balance                                           ADL either performed or assessed with clinical judgement   ADL                                         General ADL Comments: can bring hand to mouth with support at elbow    Extremity/Trunk Assessment Upper Extremity Assessment Upper Extremity Assessment: Right hand dominant RUE Deficits / Details: 2/5 shoulder, 3/5 elbow, grasp intermittent/reflexive at tiems RUE Coordination: decreased fine motor;decreased gross motor LUE Deficits / Details: 2/5 shoulder with tightness > 90 degrees FF, 2+/5 elbow, can bring thumb to 2nd digit but unable to perform full grasp LUE Coordination: decreased fine motor;decreased gross motor   Lower Extremity Assessment Lower Extremity Assessment: Defer to PT evaluation  Vision   Vision Assessment?: No apparent visual deficits   Perception Perception Perception: Not tested   Praxis Praxis Praxis: Not tested   Communication Communication Communication: Impaired Factors Affecting Communication: Reduced clarity of speech   Cognition Arousal: Lethargic, Alert Behavior During Therapy: Flat affect                                 Following commands: Impaired Following commands impaired: Follows one step commands with  increased time, Follows one step commands inconsistently      Cueing   Cueing Techniques: Verbal cues, Tactile cues  Exercises Exercises: Other exercises Other Exercises Other Exercises: AAROM bil elbow flex ext Other Exercises: bil shoudler shrugs    Shoulder Instructions       General Comments VSS on RA    Pertinent Vitals/ Pain       Pain Assessment Pain Assessment: Faces  Home Living                                          Prior Functioning/Environment              Frequency  Min 2X/week        Progress Toward Goals  OT Goals(current goals can now be found in the care plan section)  Progress towards OT goals: Progressing toward goals  Acute Rehab OT Goals OT Goal Formulation: With patient Time For Goal Achievement: 08/05/24 Potential to Achieve Goals: Good ADL Goals Pt Will Perform Eating: bed level;sitting;with min assist Pt Will Perform Grooming: with min assist;sitting;bed level Pt/caregiver will Perform Home Exercise Program: Increased strength;Both right and left upper extremity;Increased ROM;With minimal assist Additional ADL Goal #1: Pt will activate soft touch call button to request assistance. Additional ADL Goal #2: Pt will roll with moderate assistance for positioning and pericare.  Plan      Co-evaluation    PT/OT/SLP Co-Evaluation/Treatment: Yes Reason for Co-Treatment: Complexity of the patient's impairments (multi-system involvement) PT goals addressed during session: Mobility/safety with mobility OT goals addressed during session: ADL's and self-care;Strengthening/ROM      AM-PAC OT 6 Clicks Daily Activity     Outcome Measure   Help from another person eating meals?: Total Help from another person taking care of personal grooming?: Total Help from another person toileting, which includes using toliet, bedpan, or urinal?: Total Help from another person bathing (including washing, rinsing, drying)?:  Total Help from another person to put on and taking off regular upper body clothing?: Total Help from another person to put on and taking off regular lower body clothing?: Total 6 Click Score: 6    End of Session Equipment Utilized During Treatment: Cervical collar  OT Visit Diagnosis: Muscle weakness (generalized) (M62.81);Pain;Other symptoms and signs involving cognitive function Pain - part of body:  (neck)   Activity Tolerance Patient tolerated treatment well   Patient Left in bed;with call bell/phone within reach;with bed alarm set;with nursing/sitter in room   Nurse Communication Mobility status        Time: 8944-8857 OT Time Calculation (min): 47 min  Charges: OT General Charges $OT Visit: 1 Visit OT Treatments $Self Care/Home Management : 8-22 mins $Therapeutic Activity: 8-22 mins  Lawrence [name], OTD, OTR/L SecureChat Preferred Acute Rehab [phone]   Lawrence [name] 07/25/2024, 12:26 PM

## 2024-07-25 NOTE — Progress Notes (Signed)
 Lawrence [name] is an 78 y.o. male diabetes, hypertension, BPH status post TURP initially presenting after a ground-level fall about 5 days prior to admission. Of note over the past few weeks he has had neuropathy in his feet, felt more unsteady and used a cane because of his neuropathy.  Unfortunately he slipped and fell and hit his head on the sink followed by new onset numbness in his legs, inability to sit up and loss of function in his legs as well as urinary incontinence.  He was to have a partial anterior cervical discectomy C7-T1 to stabilize even though he was not likely to regain strength in his lower extremities but during the operative course he developed PEA arrest x 2 with subsequent CT scan showing a massive bilateral PE with poor lung perfusion.  Patient also had successful thrombectomy performed by VIR removing a large clot burden at the right PA bifurcation as well as a large clot burden in the left lower lung lobe. BL cr is approximately 0.9-1.1 (12/02/22) but with his complicated course he has developed renal failure with very poor urine output.  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 - He does not want long-term dialysis but is willing to give a trial with short-term dialysis to give his kidneys a chance to recover -Initially anuric with K trending up --> started CRRT on 10/29 mid afternoon. CVP 8 tolerating UF 50 ml/hr, no issues with filter clotting. + 1.3  L during hospitalization. Likely will be a prolonged course given significant renal injury, hopefully no cortical necrosis. Updated spouse and son who were bedside.  Had to change to 2K dialysate bath on 10/31 (K trending up), appears to be stabilizing; UF goal 0-100 -> 6ml/hr as we are likely getting closer. UF still 8. Cont 2K for dialysate  Phos repleted.  -Monitor Daily I/Os, Daily weight  -Maintain  MAP>65 for optimal renal perfusion.  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 Acute respiratory failure -off sedation and alert, and possible extubation trial pending weaning parameters Cardiogenic shock secondary to pulmonary embolus -off epinephrine, still on Levophed by cardiology  Subjective: Denies fever, chills, nausea, SOB; CVP 8. Spouse and son bedside updated.   Chemistry and CBC: Creatinine  Date/Time Value Ref Range Status  02/21/2021 12:00 AM 1.2 0.6 - 1.3 Final  10/27/2020 12:00 AM 0.9 0.6 - 1.3 Final   Creatinine, Ser  Date/Time Value Ref Range Status  07/25/2024 04:01 AM 1.86 (H) 0.61 - 1.24 mg/dL Final  88/97/7974 96:49 PM 1.91 (H) 0.61 - 1.24 mg/dL Final  88/97/7974 95:54 AM 2.13 (H) 0.61 - 1.24 mg/dL Final  88/98/7974 95:99 PM 2.33 (H) 0.61 - 1.24 mg/dL Final  88/98/7974 96:69 AM 2.23 (H) 0.61 - 1.24 mg/dL Final  89/68/7974 95:94 PM 2.36 (H) 0.61 - 1.24 mg/dL Final  89/68/7974 96:65 AM 2.57 (H) 0.61 - 1.24 mg/dL Final  89/69/7974 95:49 PM 3.03 (H) 0.61 - 1.24 mg/dL Final  89/69/7974 95:83 AM 4.16 (H) 0.61 - 1.24 mg/dL Final  89/70/7974 95:60 PM 5.73 (H) 0.61 - 1.24 mg/dL Final  89/70/7974 94:86 AM 6.10 (H) 0.61 -  1.24 mg/dL Final  89/71/7974 90:99 PM 5.41 (H) 0.61 - 1.24 mg/dL Final  89/71/7974 97:46 PM 5.16 (H) 0.61 - 1.24 mg/dL Final  89/71/7974 98:44 AM 4.18 (H) 0.61 - 1.24 mg/dL Final  89/72/7974 95:83 PM 3.69 (H) 0.61 - 1.24 mg/dL Final  89/72/7974 95:82 AM 2.90 (H) 0.61 - 1.24 mg/dL Final  89/73/7974 90:59 PM 2.37 (H) 0.61 - 1.24 mg/dL Final  89/73/7974 97:48 PM 1.50 (H) 0.61 - 1.24 mg/dL Final  89/73/7974 95:74 AM 0.99 0.61 - 1.24 mg/dL Final  89/74/7974 92:40 PM 1.08 0.61 -  1.24 mg/dL Final  96/87/7975 89:41 AM 1.02 0.61 - 1.24 mg/dL Final  97/79/7976 98:95 PM 0.92 0.61 - 1.24 mg/dL Final  95/96/7977 93:65 PM 0.86 0.61 - 1.24 mg/dL Final   Recent Labs  Lab 07/22/24 0334 07/22/24 1605 07/23/24 0330 07/23/24 1600 07/24/24 0445 07/24/24 1550 07/25/24 0401  NA 135 136 133* 133* 134* 135 134*  K 5.3* 4.9 5.1 5.3* 4.9 4.7 4.6  CL 100 100 100 100 100 100 98  CO2 24 24 23 23 23 24 23   GLUCOSE 283* 276* 291* 262* 243* 235* 256*  BUN 54* 48* 54* 53* 49* 48* 47*  CREATININE 2.57* 2.36* 2.23* 2.33* 2.13* 1.91* 1.86*  CALCIUM 7.7* 7.7* 8.0* 8.0* 8.5* 8.8* 8.8*  PHOS 1.8*  1.8* 3.5 2.0*  1.8* 4.4 3.1  3.0 3.1 3.1   Recent Labs  Lab 07/22/24 0334 07/23/24 0330 07/24/24 0732 07/25/24 0401  WBC 11.7* 14.3* 16.4* 15.9*  HGB 6.8* 7.6* 7.8* 8.1*  HCT 20.2* 23.1* 23.8* 24.8*  MCV 94.4 94.3 96.0 96.9  PLT 129* 135* 148* 186   Liver Function Tests: Recent Labs  Lab 07/19/24 0155 07/19/24 2100 07/24/24 0445 07/24/24 1550 07/25/24 0401  AST 622*  --   --   --   --   ALT 673*  --   --   --   --   ALKPHOS 50  --   --   --   --   BILITOT 2.3*  --   --   --   --   PROT 3.9*  --   --   --   --   ALBUMIN 1.9*   < > 2.0* 2.1* 2.0*   < > = values in this interval not displayed.   No results for input(s): LIPASE, AMYLASE in the last 168 hours.  No results for input(s): AMMONIA in the last 168 hours. Cardiac Enzymes: No results for input(s): CKTOTAL, CKMB, CKMBINDEX, TROPONINI in the last 168 hours.  Iron Studies: No results for input(s): IRON, TIBC, TRANSFERRIN, FERRITIN in the last 72 hours. PT/INR: @LABRCNTIP (inr:5)  Xrays/Other Studies: ) Results for orders placed or performed during the hospital encounter of 07/16/24 (from the past 48 hours)  Glucose, capillary     Status: Abnormal   Collection Time: 07/23/24 11:47 AM  Result Value Ref Range   Glucose-Capillary 260 (H) 70 - 99 mg/dL    Comment: Glucose reference range  applies only to samples taken after fasting for at least 8 hours.  Renal function panel (daily at 1600)     Status: Abnormal   Collection Time: 07/23/24  4:00 PM  Result Value Ref Range   Sodium 133 (L) 135 - 145 mmol/L   Potassium 5.3 (H) 3.5 - 5.1 mmol/L   Chloride 100 98 - 111 mmol/L   CO2 23 22 - 32 mmol/L   Glucose, Bld 262 (H) 70 - 99 mg/dL    Comment: Glucose  reference range applies only to samples taken after fasting for at least 8 hours.   BUN 53 (H) 8 - 23 mg/dL   Creatinine, Ser 7.66 (H) 0.61 - 1.24 mg/dL   Calcium 8.0 (L) 8.9 - 10.3 mg/dL   Phosphorus 4.4 2.5 - 4.6 mg/dL   Albumin 2.0 (L) 3.5 - 5.0 g/dL   GFR, Estimated 28 (L) >60 mL/min    Comment: (NOTE) Calculated using the CKD-EPI Creatinine Equation (2021)    Anion gap 10 5 - 15    Comment: Performed at [facility], [address], [place], [place] [address]  Glucose, capillary     Status: Abnormal   Collection Time: 07/23/24  4:46 PM  Result Value Ref Range   Glucose-Capillary 247 (H) 70 - 99 mg/dL    Comment: Glucose reference range applies only to samples taken after fasting for at least 8 hours.  Glucose, capillary     Status: Abnormal   Collection Time: 07/23/24  7:20 PM  Result Value Ref Range   Glucose-Capillary 261 (H) 70 - 99 mg/dL    Comment: Glucose reference range applies only to samples taken after fasting for at least 8 hours.  Glucose, capillary     Status: Abnormal   Collection Time: 07/23/24 11:03 PM  Result Value Ref Range   Glucose-Capillary 293 (H) 70 - 99 mg/dL    Comment: Glucose reference range applies only to samples taken after fasting for at least 8 hours.  Glucose, capillary     Status: Abnormal   Collection Time: 07/24/24  3:12 AM  Result Value Ref Range   Glucose-Capillary 188 (H) 70 - 99 mg/dL    Comment: Glucose reference range applies only to samples taken after fasting for at least 8 hours.  Heparin  level (unfractionated)     Status: None   Collection Time: 07/24/24   4:45 AM  Result Value Ref Range   Heparin  Unfractionated 0.55 0.30 - 0.70 IU/mL    Comment: (NOTE) The clinical reportable range upper limit is being lowered to >1.10 to align with the FDA approved guidance for the current laboratory assay.  If heparin  results are below expected values, and patient dosage has  been confirmed, suggest follow up testing of antithrombin III levels. Performed at [facility], [address] [place], [place] [address]   Renal function panel (daily at 0500)     Status: Abnormal   Collection Time: 07/24/24  4:45 AM  Result Value Ref Range   Sodium 134 (L) 135 - 145 mmol/L   Potassium 4.9 3.5 - 5.1 mmol/L   Chloride 100 98 - 111 mmol/L   CO2 23 22 - 32 mmol/L   Glucose, Bld 243 (H) 70 - 99 mg/dL    Comment: Glucose reference range applies only to samples taken after fasting for at least 8 hours.   BUN 49 (H) 8 - 23 mg/dL   Creatinine, Ser 7.86 (H) 0.61 - 1.24 mg/dL   Calcium 8.5 (L) 8.9 - 10.3 mg/dL   Phosphorus 3.0 2.5 - 4.6 mg/dL   Albumin 2.0 (L) 3.5 - 5.0 g/dL   GFR, Estimated 31 (L) >60 mL/min    Comment: (NOTE) Calculated using the CKD-EPI Creatinine Equation (2021)    Anion gap 11 5 - 15    Comment: Performed at [facility], [address], [place], [place] [address]  Magnesium     Status: None   Collection Time: 07/24/24  4:45 AM  Result Value  Ref Range   Magnesium 2.1 1.7 - 2.4 mg/dL    Comment: Performed at [facility], [address] [place], [place] [address]  Phosphorus     Status: None   Collection Time: 07/24/24  4:45 AM  Result Value Ref Range   Phosphorus 3.1 2.5 - 4.6 mg/dL    Comment: Performed at [facility], [address], [place], [place] [address]  Glucose, capillary     Status: Abnormal   Collection Time: 07/24/24  7:17 AM  Result Value Ref Range   Glucose-Capillary 238 (H) 70 - 99 mg/dL    Comment: Glucose reference range applies only to samples taken after fasting for at least 8 hours.   CBC     Status: Abnormal   Collection Time: 07/24/24  7:32 AM  Result Value Ref Range   WBC 16.4 (H) 4.0 - 10.5 K/uL   RBC 2.48 (L) 4.22 - 5.81 MIL/uL   Hemoglobin 7.8 (L) 13.0 - 17.0 g/dL   HCT 76.1 (L) 60.9 - 47.9 %   MCV 96.0 80.0 - 100.0 fL   MCH 31.5 26.0 - 34.0 pg   MCHC 32.8 30.0 - 36.0 g/dL   RDW 83.4 (H) 88.4 - 84.4 %   Platelets 148 (L) 150 - 400 K/uL   nRBC 0.5 (H) 0.0 - 0.2 %    Comment: Performed at [facility], [address] [place], [place] [address]  Glucose, capillary     Status: Abnormal   Collection Time: 07/24/24 11:48 AM  Result Value Ref Range   Glucose-Capillary 280 (H) 70 - 99 mg/dL    Comment: Glucose reference range applies only to samples taken after fasting for at least 8 hours.  Renal function panel (daily at 1600)     Status: Abnormal   Collection Time: 07/24/24  3:50 PM  Result Value Ref Range   Sodium 135 135 - 145 mmol/L   Potassium 4.7 3.5 - 5.1 mmol/L   Chloride 100 98 - 111 mmol/L   CO2 24 22 - 32 mmol/L   Glucose, Bld 235 (H) 70 - 99 mg/dL    Comment: Glucose reference range applies only to samples taken after fasting for at least 8 hours.   BUN 48 (H) 8 - 23 mg/dL   Creatinine, Ser 8.08 (H) 0.61 - 1.24 mg/dL   Calcium 8.8 (L) 8.9 - 10.3 mg/dL   Phosphorus 3.1 2.5 - 4.6 mg/dL   Albumin 2.1 (L) 3.5 - 5.0 g/dL   GFR, Estimated 36 (L) >60 mL/min    Comment: (NOTE) Calculated using the CKD-EPI Creatinine Equation (2021)    Anion gap 11 5 - 15    Comment: Performed at [facility], [address], [place], [place] [address]  Glucose, capillary     Status: Abnormal   Collection Time: 07/24/24  3:50 PM  Result Value Ref Range   Glucose-Capillary 221 (H) 70 - 99 mg/dL    Comment: Glucose reference range applies only to samples taken after fasting for at least 8 hours.  Glucose, capillary     Status: Abnormal   Collection Time: 07/24/24  7:10 PM  Result Value Ref Range   Glucose-Capillary 228 (H) 70 - 99 mg/dL    Comment:  Glucose reference range applies only to samples taken after fasting for at least 8 hours.  Glucose, capillary     Status: Abnormal   Collection Time: 07/24/24 11:06 PM  Result Value Ref Range  Glucose-Capillary 325 (H) 70 - 99 mg/dL    Comment: Glucose reference range applies only to samples taken after fasting for at least 8 hours.  Glucose, capillary     Status: Abnormal   Collection Time: 07/25/24  3:08 AM  Result Value Ref Range   Glucose-Capillary 236 (H) 70 - 99 mg/dL    Comment: Glucose reference range applies only to samples taken after fasting for at least 8 hours.  Heparin  level (unfractionated)     Status: None   Collection Time: 07/25/24  4:01 AM  Result Value Ref Range   Heparin  Unfractionated 0.56 0.30 - 0.70 IU/mL    Comment: (NOTE) The clinical reportable range upper limit is being lowered to >1.10 to align with the FDA approved guidance for the current laboratory assay.  If heparin  results are below expected values, and patient dosage has  been confirmed, suggest follow up testing of antithrombin III levels. Performed at [facility], [address], [place], [place] [address]   Renal function panel (daily at 0500)     Status: Abnormal   Collection Time: 07/25/24  4:01 AM  Result Value Ref Range   Sodium 134 (L) 135 - 145 mmol/L   Potassium 4.6 3.5 - 5.1 mmol/L   Chloride 98 98 - 111 mmol/L   CO2 23 22 - 32 mmol/L   Glucose, Bld 256 (H) 70 - 99 mg/dL    Comment: Glucose reference range applies only to samples taken after fasting for at least 8 hours.   BUN 47 (H) 8 - 23 mg/dL   Creatinine, Ser 8.13 (H) 0.61 - 1.24 mg/dL   Calcium 8.8 (L) 8.9 - 10.3 mg/dL   Phosphorus 3.1 2.5 - 4.6 mg/dL   Albumin 2.0 (L) 3.5 - 5.0 g/dL   GFR, Estimated 37 (L) >60 mL/min    Comment: (NOTE) Calculated using the CKD-EPI Creatinine Equation (2021)    Anion gap 13 5 - 15    Comment: Performed at [facility], [address] [place], [place] [address]  Magnesium      Status: None   Collection Time: 07/25/24  4:01 AM  Result Value Ref Range   Magnesium 2.0 1.7 - 2.4 mg/dL    Comment: Performed at [facility], [address], [place], [place] [address]  CBC     Status: Abnormal   Collection Time: 07/25/24  4:01 AM  Result Value Ref Range   WBC 15.9 (H) 4.0 - 10.5 K/uL   RBC 2.56 (L) 4.22 - 5.81 MIL/uL   Hemoglobin 8.1 (L) 13.0 - 17.0 g/dL   HCT 75.1 (L) 60.9 - 47.9 %   MCV 96.9 80.0 - 100.0 fL   MCH 31.6 26.0 - 34.0 pg   MCHC 32.7 30.0 - 36.0 g/dL   RDW 83.5 (H) 88.4 - 84.4 %   Platelets 186 150 - 400 K/uL   nRBC 0.2 0.0 - 0.2 %    Comment: Performed at [facility], [address], [place], [place] [address]  Glucose, capillary     Status: Abnormal   Collection Time: 07/25/24  7:52 AM  Result Value Ref Range   Glucose-Capillary 293 (H) 70 - 99 mg/dL    Comment: Glucose reference range applies only to samples taken after fasting for at least 8 hours.   No results found.   PMH:   Past Medical History:  Diagnosis Date   Arthritis    Diabetes mellitus without complication (HCC)  GERD (gastroesophageal reflux disease)    Hypertension    Sleep apnea     PSH:   Past Surgical History:  Procedure Laterality Date   ANTERIOR CERVICAL DECOMP/DISCECTOMY FUSION N/A 07/17/2024   Procedure: ANTERIOR CERVICAL DECOMPRESSION/DISCECTOMY FUSION, CERVICAL SEVEN - THORACIC ONE;  Surgeon: Debby Dorn MATSU, MD;  Location: [facility] OR;  Service: Neurosurgery;  Laterality: N/A;  Partial Discecomy Cervical seven- Thoracic one   APPENDECTOMY     78 yo   BACK SURGERY  1975   lumbar diskectomy   done [facility]   COLONOSCOPY N/A 02/21/2015   Procedure: COLONOSCOPY;  Surgeon: Claudis RAYMOND Rivet, MD;  Location: [facility] ENDO SUITE;  Service: Endoscopy;  Laterality: N/A;  1025   COLONOSCOPY  11/13/2021   COLONOSCOPY WITH PROPOFOL  N/A 11/13/2021   Procedure: COLONOSCOPY WITH PROPOFOL ;  Surgeon: Rivet Claudis RAYMOND, MD;  Location: [facility] ENDO SUITE;  Service:  Endoscopy;  Laterality: N/A;  155   CYSTOSCOPY WITH URETHRAL DILATATION N/A 12/09/2022   Procedure: CYSTOSCOPY WITH URETHRAL DILATATION;  Surgeon: Watt Rush, MD;  Location: [facility] ORS;  Service: Urology;  Laterality: N/A;   EYE SURGERY Bilateral    Lasik surgery   IR ANGIOGRAM PULMONARY BILATERAL SELECTIVE  07/18/2024   IR ANGIOGRAM SELECTIVE EACH ADDITIONAL VESSEL  07/18/2024   IR THROMBECT PRIM MECH ADD (INCLU) MOD SED  07/18/2024   IR THROMBECT PRIM MECH ADD (INCLU) MOD SED  07/18/2024   IR THROMBECT PRIM MECH ADD (INCLU) MOD SED  07/18/2024   LEFT HEART CATH AND CORONARY ANGIOGRAPHY N/A 12/23/2020   Procedure: LEFT HEART CATH AND CORONARY ANGIOGRAPHY;  Surgeon: Burnard Debby LABOR, MD;  Location: [facility] INVASIVE CV LAB;  Service: Cardiovascular;  Laterality: N/A;   NECK SURGERY  1999   Work injury, posterior fusion   POLYPECTOMY  11/13/2021   Procedure: POLYPECTOMY INTESTINAL;  Surgeon: Rivet Claudis RAYMOND, MD;  Location: [facility] END[facility];  Service: Endoscopy;;   SHOULDER SURGERY Bilateral 2000   rotoator cuff repair   done at [facility]   TONSILLECTOMY     TRANSURETHRAL INCISION OF PROSTATE N/A 12/09/2022   Procedure: TRANSURETHRAL RESECTION OF PROSTATE;  Surgeon: Watt Rush, MD;  Location: [facility] ORS;  Service: Urology;  Laterality: N/A;  1 HR FOR CASE    Allergies:  Allergies  Allergen Reactions   Penicillins Hives   Lucentis [Ranibizumab] Rash    Medications:   Prior to Admission medications   Medication Sig Start Date End Date Taking? Authorizing Provider  Apple Cider Vinegar 600 MG CAPS Take 600 mg by mouth daily.   Yes [provider]  aspirin  EC 81 MG tablet Take 1 tablet (81 mg total) by mouth every other day. 11/14/21  Yes Rehman, Claudis RAYMOND, MD  Barberry-Oreg Grape-Goldenseal (BERBERINE COMPLEX PO) Take 1 capsule by mouth daily.   Yes [provider]  cholecalciferol (VITAMIN D3) 25 MCG (1000 UNIT) tablet Take 1,000 Units by mouth daily.   Yes [provider]   Cinnamon 500 MG capsule Take 500 mg by mouth daily.   Yes [provider]  diphenhydrAMINE HCl (NERVINE PO) Take 1 tablet by mouth daily. Patient states that he takes one a day   Yes [provider]  empagliflozin (JARDIANCE) 10 MG TABS tablet Take 10 mg by mouth daily.   Yes [provider]  ferrous sulfate 325 (65 FE) MG tablet Take 325 mg by mouth daily with breakfast.   Yes [provider]  hydrochlorothiazide  (HYDRODIURIL ) 25 MG tablet Take 25 mg  by mouth daily. 07/23/21  Yes [provider]  insulin  degludec (TRESIBA  FLEXTOUCH) 100 UNIT/ML FlexTouch Pen Inject 12 Units into the skin at bedtime. Patient taking differently: Inject 28 Units into the skin 2 (two) times daily. 10/07/22  Yes Reardon, Benton PARAS, NP  Multiple Vitamins-Minerals (OCUVITE ADULT 50+ PO) Take 1 tablet by mouth daily.   Yes [provider]  OVER THE COUNTER MEDICATION Take 1 capsule by mouth daily. Prostagenix   Yes [provider]  OVER THE COUNTER MEDICATION Take 1 capsule by mouth daily. Diabacore (Diabetic Supplement)   Yes [provider]  OVER THE COUNTER MEDICATION Take 1 tablet by mouth in the morning and at bedtime. Glucoceil   Yes [provider]  tadalafil  (CIALIS ) 5 MG tablet Take 1 tablet (5 mg total) by mouth daily as needed for erectile dysfunction. 12/06/20  Yes Watt Rush, MD  vitamin E 180 MG (400 UNITS) capsule Take 400 Units by mouth daily.   Yes [provider]  Vitamin Mixture (ESTER-C PO) Take 1,000 mg by mouth daily.   Yes [provider]    Discontinued Meds:   Medications Discontinued During This Encounter  Medication Reason   0.9 %  sodium chloride  infusion    lactated ringers  infusion Patient Transfer   insulin  aspart (novoLOG ) injection 0-7 Units Patient Transfer   lidocaine -EPINEPHrine (XYLOCAINE  W/EPI) 1 %-1:100000 (with pres) injection Patient Transfer   0.9 % irrigation (POUR BTL) Patient  Transfer   Surgifoam 1 Gm with Thrombin 5,000 units (5 ml) topical solution Patient Transfer   HYDROmorphone  (DILAUDID ) injection 0.25-0.5 mg Patient Transfer   droperidol (INAPSINE) 2.5 MG/ML injection 0.625 mg Patient Transfer   insulin  glargine-yfgn (SEMGLEE) injection 28 Units    midazolam  (VERSED ) 2 MG/2ML injection Returned to ADS   Thrombi-Pad 3X3 pad 1 each    insulin  aspart (novoLOG ) injection 0-6 Units    insulin  glargine-yfgn (SEMGLEE) injection 20 Units    phenylephrine (NEO-SYNEPHRINE) 20mg /NS 250mL premix infusion    heparin  ADULT infusion 100 units/mL (25000 units/250mL)    fentaNYL  (SUBLIMAZE ) injection 25-50 mcg    midazolam  PF (VERSED ) injection 4 mg    norepinephrine (LEVOPHED) 4mg  in 250mL (0.016 mg/mL) premix infusion    heparin  ADULT infusion 100 units/mL (25000 units/250mL)    vasopressin (PITRESSIN) 20 Units in 100 mL (0.2 unit/mL) infusion-*FOR SHOCK*    dexmedetomidine (PRECEDEX) 400 MCG/100ML (4 mcg/mL) infusion    midazolam  (VERSED ) 100 mg/100 mL (1 mg/mL) premix infusion    calcium chloride injection 1 g    Oral care mouth rinse    lactated ringers  infusion    fentaNYL  in NS 250mL (33mcg/ml) infusion-PREMIX    fentaNYL  (SUBLIMAZE ) bolus via infusion 25-100 mcg    EPINEPHrine (ADRENALIN) 5 mg in NS 250 mL (0.02 mg/mL) premix infusion    midazolam  (VERSED ) bolus via infusion 2 mg    insulin  regular, human (MYXREDLIN) 100 units/ 100 mL infusion    dextrose  50 % solution 0-50 mL    propofol  (DIPRIVAN ) 1000 MG/100ML infusion    lidocaine  (XYLOCAINE ) 1 % (with pres) injection 20 mL    glycopyrrolate (ROBINUL) injection 0.1 mg    heparin  injection 1,000-6,000 Units    ceFEPIme (MAXIPIME) 2 g in sodium chloride  0.9 % 100 mL IVPB    ceFEPIme (MAXIPIME) 2 g in sodium chloride  0.9 % 100 mL IVPB    feeding supplement (PROSource TF20) liquid 60 mL    insulin  aspart (novoLOG ) injection 0-9 Units    insulin   aspart (novoLOG ) injection 2 Units    midodrine  (PROAMATINE) tablet 5 mg    midodrine (PROAMATINE) tablet 5 mg    pantoprazole  (PROTONIX ) injection 40 mg    polyethylene glycol (MIRALAX / GLYCOLAX) packet 17 g    insulin  aspart (novoLOG ) injection 4 Units    prismasol BGK 4/2.5 infusion    midodrine (PROAMATINE) tablet 5 mg    insulin  glargine-yfgn (SEMGLEE) injection 10 Units    bisacodyl  (DULCOLAX) suppository 10 mg    oxyCODONE  (Oxy IR/ROXICODONE ) immediate release tablet 5 mg    norepinephrine (LEVOPHED) 16 mg in 250mL (0.064 mg/mL) premix infusion    insulin  glargine-yfgn (SEMGLEE) injection 20 Units     Social History:  reports that he has never smoked. He has never used smokeless tobacco. He reports that he does not drink alcohol and does not use drugs.  Family History:  History reviewed. No pertinent family history.  Blood pressure 126/68, pulse 76, temperature 97.8 F (36.6 C), temperature source Oral, resp. rate 19, height 6' 1 (1.854 m), weight 81.8 kg, SpO2 96%. Physical Exam: General appearance: alert, cooperative, and appears stated age Head: NCAT Neck: no adenopathy, brace Resp: CTA b/l Cardio: regular rate and rhythm GI: SNDNT Extremities: tr edema Pulses: 2+ and symmetric     Juliyah Mergen, LYNWOOD ORN, MD 07/25/2024, 8:53 AM

## 2024-07-25 NOTE — Inpatient Diabetes Management (Signed)
 Inpatient Diabetes Program Recommendations  AACE/ADA: New Consensus Statement on Inpatient Glycemic Control (2015)  Target Ranges:  Prepandial:   less than 140 mg/dL      Peak postprandial:   less than 180 mg/dL (1-2 hours)      Critically ill patients:  140 - 180 mg/dL   Lab Results  Component Value Date   GLUCAP 293 (H) 07/25/2024   HGBA1C 12.3 (H) 07/17/2024    Latest Reference Range & Units 07/24/24 07:17 07/24/24 11:48 07/24/24 15:50 07/24/24 19:10 07/24/24 23:06 07/25/24 03:08 07/25/24 07:52  Glucose-Capillary 70 - 99 mg/dL 761 (H) 719 (H) 778 (H) 228 (H) 325 (H) 236 (H) 293 (H)  (H): Data is abnormally high  Inpatient Diabetes Program Recommendations:   Please consider: -Increase Novolog  tube feed coverage to 8 units q 4 hrs.  Thank you, Shanita Kanan E. Anne-Marie Genson, RN, MSN, CNS, CDCES  Diabetes Coordinator Inpatient Glycemic Control Team Team Pager [phone] (8am-5pm) 07/25/2024 8:22 AM

## 2024-07-25 NOTE — Progress Notes (Signed)
 NAME:  Lawrence [name], MRN:  [id number], DOB:  Aug 11, 1946, LOS: 8 ADMISSION DATE:  07/16/2024, CONSULTATION DATE: 07/17/2024 REFERRING MD:  Debby Carrier , CHIEF COMPLAINT: Status postcardiac arrest  History of Present Illness:  78 year old male with diabetes, hypertension, BPH status post TURP who presented 10/25 after having a ground-level fall with recent onset of numbness in his lower extremity- complete loss of sensation to BLE & urinary incontinence after fall.  Neurosurgery was consulted, patient taken to OR for partial anterior cervical discectomy C7-T1 but course was complicated with multiple time PEA cardiac arrest in OR, procedure was aborted, TEE was done which showed severe RV dysfunction and dilatation with moderate to severe LV dysfunction, patient was transferred to ICU for close monitoring.    In ICU patient blood pressure started dropping and he went into PEA cardiac arrest again, ROSC was achieved after 2 minutes.  CTA PE study with extensive pulmonary artery thrombus bilaterally with evidence of right heart strain. Unfortunately due to surgery, it was recommended no TNK/TPA. 10/27 taken for thrombectomy.   Pertinent  Medical History   Past Medical History:  Diagnosis Date   Arthritis    Diabetes mellitus without complication (HCC)    GERD (gastroesophageal reflux disease)    Hypertension    Sleep apnea     Significant Hospital Events: Including procedures, antibiotic start and stop dates in addition to other pertinent events   Admitted and PCCM consulted, coded multiple times in OR (PEA) 07/18/2024 mechanical thrombectomy was done for bilateral pulmonary embolism. 10/28 nephro consult 10/29 on 5 NE, plan for HD cath placement and CRRT  10/30 add midodrine   Interim History / Subjective:  No events overnight. Remains on CRRT. On room air. Off vasopressors. NSY to write formal note today with updated plans.   Objective    Blood pressure (!) 134/50, pulse 72,  temperature 97.8 F (36.6 C), temperature source Oral, resp. rate 17, height 6' 1 (1.854 m), weight 81.8 kg, SpO2 98%. CVP:  [2 mmHg-17 mmHg] 7 mmHg      Intake/Output Summary (Last 24 hours) at 07/25/2024 1212 Last data filed at 07/25/2024 1100 Gross per 24 hour  Intake 1810.52 ml  Output 3620.7 ml  Net -1810.18 ml   Filed Weights   07/23/24 0323 07/24/24 0500 07/25/24 0500  Weight: 82 kg 81.4 kg 81.8 kg    Examination: Physical exam: General: adult male, lying in bed, no distress  HEENT: C-Collar in place, RIJ CVC noted  Neuro: Alert, awake following commands in bilateral upper extremities, no motor movement bilateral lower, reports sensations to bilateral lower Chest: Rhonchi upper  Heart: Regular rate and rhythm, no murmurs or gallops S1-S2 heard no S3 no murmur Abdomen: Soft, nontender, nondistended, bowel sounds presen   Resolved problem list  Lactic acidosis  Endotracheally intubated  Assessment and Plan   PEA arrest in setting of massive PE s/p thrombectomy  Acute BiV HF - improving Hypotension, question in setting of cardiogenic vs neurogenic shock  -Status post mechanical thrombectomy on 07/18/2024 Plan - Cardiac Monitoring - Maintain MAP >65 - Decrease midodrine to 5 mg q8h (systolic maintains 130-150)   Acute hypoxic hypercarbic resp failure in setting of spinal injury and diaphragmatic weakness  Traumatic Rib fx Concern for bilateral PNA - s/p Rocephin for 5 days Hx OSA  Plan  - Maintain Oxygen Saturation >92 (currently on room air)  - Encourage good pulmonary hygiene   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  -s/p partial C7-T1 anterior discectomy,  aborted 2/2 intra-op PEA arrest  Plan -NSGY following -Continue C collar at all times, NSY okay for OOB as tolerated as long as C-Collar is maintained  -Discussed possible surgical timing with NSY, they will evaluate today and get back with us  -PT/OT/SLP   Acute Renal Injury in setting of multiple  cardiac arrest, ischemic ATN, hypotension, contrast  Plan -Nephrology following -Trend BMP, replace electrolytes as indicated  -Continues CRRT > plan pending per nephrology, patient had previously stated would not want longer term dialysis   Liver Shock > improving Plan -Repeat LFT 1600   Anemia Plan -Trend CBC -Hemoglobin stable today at 8.1  DM w Hyperglycemia Plan -Glucose management following  -Discuss with nutrition changing to Glucerna  -Semglee increased to 30, TF coverage increased to 8 units q8h -Continue SSI   Inadequate PO intake Dysphagia  -adv EN per RDN -Speech Following, patient failed evaluation today   Labs   CBC: Recent Labs  Lab 07/21/24 0416 07/22/24 0334 07/23/24 0330 07/24/24 0732 07/25/24 0401  WBC 10.6* 11.7* 14.3* 16.4* 15.9*  HGB 7.8* 6.8* 7.6* 7.8* 8.1*  HCT 22.2* 20.2* 23.1* 23.8* 24.8*  MCV 91.0 94.4 94.3 96.0 96.9  PLT 131* 129* 135* 148* 186    Basic Metabolic Panel: Recent Labs  Lab 07/21/24 0416 07/21/24 1650 07/22/24 0334 07/22/24 1605 07/23/24 0330 07/23/24 1600 07/24/24 0445 07/24/24 1550 07/25/24 0401  NA 134*   < > 135   < > 133* 133* 134* 135 134*  K 4.4   < > 5.3*   < > 5.1 5.3* 4.9 4.7 4.6  CL 100   < > 100   < > 100 100 100 100 98  CO2 22   < > 24   < > 23 23 23 24 23   GLUCOSE 263*   < > 283*   < > 291* 262* 243* 235* 256*  BUN 68*   < > 54*   < > 54* 53* 49* 48* 47*  CREATININE 4.16*   < > 2.57*   < > 2.23* 2.33* 2.13* 1.91* 1.86*  CALCIUM 7.5*   < > 7.7*   < > 8.0* 8.0* 8.5* 8.8* 8.8*  MG 2.3  --  2.3  --  2.2  --  2.1  --  2.0  PHOS 4.0  4.2   < > 1.8*  1.8*   < > 2.0*  1.8* 4.4 3.1  3.0 3.1 3.1   < > = values in this interval not displayed.   GFR: Estimated Creatinine Clearance: 38.2 mL/min (A) (by C-G formula based on SCr of 1.86 mg/dL (H)). Recent Labs  Lab 07/18/24 1616 07/19/24 0155 07/20/24 0513 07/22/24 0334 07/23/24 0330 07/24/24 0732 07/25/24 0401  WBC 16.4* 14.0*   < > 11.7* 14.3*  16.4* 15.9*  LATICACIDVEN 3.2* 1.4  --   --   --   --   --    < > = values in this interval not displayed.    Liver Function Tests: Recent Labs  Lab 07/19/24 0155 07/19/24 2100 07/23/24 0330 07/23/24 1600 07/24/24 0445 07/24/24 1550 07/25/24 0401  AST 622*  --   --   --   --   --   --   ALT 673*  --   --   --   --   --   --   ALKPHOS 50  --   --   --   --   --   --   BILITOT 2.3*  --   --   --   --   --   --  PROT 3.9*  --   --   --   --   --   --   ALBUMIN 1.9*   < > 2.0* 2.0* 2.0* 2.1* 2.0*   < > = values in this interval not displayed.   No results for input(s): LIPASE, AMYLASE in the last 168 hours.  No results for input(s): AMMONIA in the last 168 hours.  ABG    Component Value Date/Time   PHART 7.461 (H) 07/19/2024 0113   PCO2ART 29.2 (L) 07/19/2024 0113   PO2ART 158 (H) 07/19/2024 0113   HCO3 20.8 07/19/2024 0113   TCO2 22 07/19/2024 0113   ACIDBASEDEF 3.0 (H) 07/19/2024 0113   O2SAT 63.1 07/20/2024 1200     Coagulation Profile: No results for input(s): INR, PROTIME in the last 168 hours.   Cardiac Enzymes: No results for input(s): CKTOTAL, CKMB, CKMBINDEX, TROPONINI in the last 168 hours.   HbA1C: Hemoglobin A1C  Date/Time Value Ref Range Status  07/23/2021 12:00 AM 11.9  Final  10/27/2020 12:00 AM 9.3  Final   HbA1c, POC (controlled diabetic range)  Date/Time Value Ref Range Status  11/19/2021 11:44 AM 10.2 (A) 0.0 - 7.0 % Final   HbA1c POC (<> result, manual entry)  Date/Time Value Ref Range Status  03/18/2022 03:21 PM 10.8 4.0 - 5.6 % Final   Hgb A1c MFr Bld  Date/Time Value Ref Range Status  07/17/2024 04:25 AM 12.3 (H) 4.8 - 5.6 % Final    Comment:    (NOTE) Diagnosis of Diabetes The following HbA1c ranges recommended by the American Diabetes Association (ADA) may be used as an aid in the diagnosis of diabetes mellitus.  Hemoglobin             Suggested A1C NGSP%              Diagnosis  <5.7                    Non Diabetic  5.7-6.4                Pre-Diabetic  >6.4                   Diabetic  <7.0                   Glycemic control for                       adults with diabetes.    12/02/2022 11:30 AM 11.9 (H) 4.8 - 5.6 % Final    Comment:    (NOTE)         Prediabetes: 5.7 - 6.4         Diabetes: >6.4         Glycemic control for adults with diabetes: <7.0     CBG: Recent Labs  Lab 07/24/24 1910 07/24/24 2306 07/25/24 0308 07/25/24 0752 07/25/24 1140  GLUCAP 228* 325* 236* 293* 264*    CRITICAL CARE Performed by: Dennis CHRISTELLA An   Total critical care time: 35 minutes  CRITICAL CARE Performed by: Dennis CHRISTELLA An   Total critical care time: 35 minutes  Critical care time was exclusive of separately billable procedures and treating other patients.  Critical care was necessary to treat or prevent imminent or life-threatening deterioration.  Critical care was time spent personally by me on the following activities: development of treatment plan with patient and/or surrogate as well as nursing, discussions with consultants, evaluation  of patient's response to treatment, examination of patient, obtaining history from patient or surrogate, ordering and performing treatments and interventions, ordering and review of laboratory studies, ordering and review of radiographic studies, pulse oximetry and re-evaluation of patient's condition.  Dennis An, AGACNP-BC [facility]  PCCM Pgr: [phone]

## 2024-07-25 NOTE — Progress Notes (Signed)
    Providing Compassionate, Quality Care - Together   NEUROSURGERY PROGRESS NOTE     S: Remains on CRRT. Off pressors. C/o some neck pain.    O: EXAM:  BP (!) 150/48   Pulse 65   Temp 97.8 F (36.6 C) (Oral)   Resp 16   Ht 6' 1 (1.854 m)   Wt 81.8 kg   SpO2 98%   BMI 23.79 kg/m   Speech fluent.  Dressing c/d/i  No movement in legs, insensate below trunk.  Hand strength, currently 2/5 strength. Sensation grossly intact    ASSESSMENT:  78 y.o. s/p partial C7-T1 discectomy for SCI, aborted due to arrest/PEA.     PLAN: -CT cervical spine pending.  -Dr. Debby to discuss surgical options with patient pending CT results -Ok to work with therapies as tolerated up to bedside chair -Continue C collar at all times -Pending surgical plans, staples from operative site will need to be removed at some point this week.    Camie Pickle, [facility]

## 2024-07-25 NOTE — Progress Notes (Signed)
 PHARMACY - ANTICOAGULATION CONSULT NOTE  Pharmacy Consult for heparin  Indication: pulmonary embolus  Allergies  Allergen Reactions   Penicillins Hives   Lucentis [Ranibizumab] Rash    Patient Measurements: Height: 6' 1 (185.4 cm) Weight: 81.8 kg (180 lb 5.4 oz) IBW/kg (Calculated) : 79.9 HEPARIN  DW (KG): 81.8  Vital Signs: Temp: 97.8 F (36.6 C) (11/03 0750) Temp Source: Oral (11/03 0750) BP: 126/68 (11/03 0702) Pulse Rate: 76 (11/03 0700)  Labs: Recent Labs    07/23/24 0330 07/23/24 1600 07/24/24 0445 07/24/24 0732 07/24/24 1550 07/25/24 0401  HGB 7.6*  --   --  7.8*  --  8.1*  HCT 23.1*  --   --  23.8*  --  24.8*  PLT 135*  --   --  148*  --  186  HEPARINUNFRC 0.54  --  0.55  --   --  0.56  CREATININE 2.23*   < > 2.13*  --  1.91* 1.86*   < > = values in this interval not displayed.    Estimated Creatinine Clearance: 38.2 mL/min (A) (by C-G formula based on SCr of 1.86 mg/dL (H)).   Assessment: Lawrence [name] is a 52 YOM admitted with concern for spinal cord injury. Patient taken to the OR for neurosurgery, procedure complicated by multiple rounds of CPR.   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 S/p IR 10/27 for thrombectomy.  Heparin  level 0.56 is therapeutic with heparin  running at 1200 units/hr. Hgb (8.1) and PLTs 186) are stable.    Goal of Therapy:  Heparin  level 0.3-0.7 units/ml Monitor platelets by anticoagulation protocol: Yes   Plan:  Continue heparin  infusion at 1200 units/hr Monitor daily heparin  level, CBC, and signs/symptoms of bleeding F/u plans for transition to oral [facility]   Thank you for allowing pharmacy to be a part of this patient's care.  Prentice Poisson, PharmD Clinical Pharmacist **Pharmacist phone directory can now be found on [email or web address] ([email or web address]).  Listed under [facility].    07/25/2024  8:01 AM

## 2024-07-25 NOTE — Progress Notes (Signed)
 Orthopedic Tech Progress Note Patient Details:  Lawrence [name] 05/04/1946 [id number]  Called in order to [employer] for [employer] HAND [employer]PLINTS   Patient ID: Lawrence [name], male   DOB: [birth year], 78 y.o.   MRN: [id number]  Delanna LITTIE Pac 07/25/2024, 12:49 PM

## 2024-07-25 NOTE — Progress Notes (Signed)
 Inpatient Rehab Admissions Coordinator:   Chart reviewed.  Pt admitted after a fall at home w/ immediate onset inability to move LEs.  Workup revealed C7-T1 signal change concerned for spinal cord infarct or transverse myelitis with fracture of T1 body.  Neurosurgery attempted ACDF of C7-T1 which was aborted when pt had PEA arrest.  He has had multiple arrests this hospitalization due to massive PE with right heart strain, for which he underwent a mechanical thrombectomy with IR.  Hospital course further complicated by renal failure and pt currently on CRRT.  Neurosurgery to see again today and weigh in on surgery. Not quite ready for rehab consideration yet, but will ask Dr. Cornelio to see tomorrow for SCI management and I will f/u at the bedside once off CRRT.    Reche Lowers, PT, DPT Admissions Coordinator [phone] 07/25/24 1:46 PM

## 2024-07-25 NOTE — Evaluation (Signed)
 Physical Therapy Evaluation Patient Details Name: Lawrence [name] MRN: [id number] DOB: 1945-12-23 Today's Date: 07/25/2024  History of Present Illness  Pt is a 78 y.o. male who presented 07/16/24 s/p fall in which he hit his neck and head. MRI showed cord signal change C7-T1 concerning for spinal cord infarct or possibly transverse myelitis. Imaging also showed hairline fracture of the T1 body, mild PLC edema without disruption. S/p aborted partial anterior cervical discectomy C7-T1 due to pt going into PEA cardiac arrest 10/26. Went into PEA cardiac arrest again in ICU. Pt found to have large bil PE. S/p mechanical thrombectomy 10/27. ETT 10/26-10/28. Started CRRT 10/29. PMH: arthritis, DM, GERD, HTN, sleep apnea, BPH status post TURP  Clinical Impression  Up to total assist +2 for bed mobility. Able to sit EOB majority of session working on seated balance, leaning, and midline control. Max assist for seated balance with UEs  on bed to assist but cannot control on his own yet. LE appreciable movement in LEs today. PROM to RLE, limited with Lt due to CRRT line. Moves scapulae well. Patient will continue to benefit from skilled physical therapy services to further improve independence with functional mobility.         If plan is discharge home, recommend the following: Two people to help with walking and/or transfers;Two people to help with bathing/dressing/bathroom;Assistance with cooking/housework;Direct supervision/assist for medications management;Direct supervision/assist for financial management;Assistance with feeding;Assist for transportation;Help with stairs or ramp for entrance;Supervision due to cognitive status   Can travel by private vehicle        Equipment Recommendations Hospital bed;Wheelchair (measurements PT);Wheelchair cushion (measurements PT);BSC/3in1;Hoyer lift (air mattress; roho cushion; custom w/c; slide board)  Recommendations for Other Services  Rehab consult     Functional Status Assessment Patient has had a recent decline in their functional status and demonstrates the ability to make significant improvements in function in a reasonable and predictable amount of time.     Precautions / Restrictions Precautions Precautions: Fall;Cervical Precaution Booklet Issued: No Precaution/Restrictions Comments: CRRT Lt fem., A-line, MAP > 65, cardiac arrest x2 this admission Required Braces or Orthoses: Cervical Brace Cervical Brace: Hard collar;At all times Restrictions Weight Bearing Restrictions Per Provider Order: No      Mobility  Bed Mobility Overal bed mobility: Needs Assistance Bed Mobility: Sidelying to Sit, Sit to Supine Rolling: Total assist, +2 for physical assistance Sidelying to sit: Total assist, +2 for physical assistance   Sit to supine: Total assist, +2 for physical assistance   General bed mobility comments: Total assist +2 for rolling and with trunk and LE support to rise and lower back into bed. Cues throughout, pt able to move UEs but no appreciable movement in LEs with functional tasks.    Transfers                   General transfer comment: deferred    Ambulation/Gait               General Gait Details: unable, cannot move legs  Stairs            Wheelchair Mobility     Tilt Bed    Modified Rankin (Stroke Patients Only)       Balance Overall balance assessment: History of Falls, Needs assistance Sitting-balance support: Bilateral upper extremity supported, Feet supported Sitting balance-Leahy Scale: Zero Sitting balance - Comments: Sat EOB majority of session working on seated balance, leaning, midline control. Unable to stabilize himself with BIL UEs at this time.  Pertinent Vitals/Pain Pain Assessment Pain Assessment: Faces Faces Pain Scale: Hurts little more Pain Location: neck Pain Descriptors / Indicators: Discomfort,  Grimacing Pain Intervention(s): Limited activity within patient's tolerance, Monitored during session, Repositioned    Home Living Family/patient expects to be discharged to:: Private residence Living Arrangements: Spouse/significant other Available Help at Discharge: Family;Available 24 hours/day (wife uses a cane) Type of Home: House Home Access: Stairs to enter   [employer] of Steps: 4   Home Layout: Two level;Bed/bath upstairs;Able to live on main level with bedroom/bathroom;Full bath on main level Home Equipment: Cane - single point;BSC/3in1;Shower seat - built in;Grab bars - toilet;Grab bars - tub/shower;Hand held shower head;Wheelchair - manual Additional Comments: unsure of accuracy of information as pt is unreliable historian and no family present    Prior Function Prior Level of Function : Independent/Modified Independent;Patient poor historian/Family not available;History of Falls (last six months)             Mobility Comments: Used cane, several recent falls       Extremity/Trunk Assessment   Upper Extremity Assessment Upper Extremity Assessment: Right hand dominant RUE Deficits / Details: 2/5 shoulder, 3/5 elbow, grasp intermittent/reflexive at tiems RUE Coordination: decreased fine motor;decreased gross motor LUE Deficits / Details: 2/5 shoulder with tightness > 90 degrees FF, 2+/5 elbow, can bring thumb to 2nd digit but unable to perform full grasp LUE Coordination: decreased fine motor;decreased gross motor    Lower Extremity Assessment Lower Extremity Assessment: Defer to PT evaluation RLE Deficits / Details: no clonus or hypertonicity noted, R leg flaccid with no active movement and with WFL PROM throughout; no withdrawal to noxious stimuli, likely no sensation but pt is an unreliable reporter when assessing sensation throughout body LLE Deficits / Details: no clonus or hypertonicity noted, R leg flaccid with no intentional active movement and  with WFL PROM at ankle (deferred moving hip and knee per RN request due to CRRT groin line); did dorsiflex ankle in response to deep noxious stimuli but not light noxious stimuli; pt is an unreliable reporter when assessing sensation throughout body    Cervical / Trunk Assessment Cervical / Trunk Assessment: Neck Surgery  Communication   Communication Communication: Impaired Factors Affecting Communication: Reduced clarity of speech    Cognition Arousal: Alert Behavior During Therapy: Flat affect   PT - Cognitive impairments: Difficult to assess Difficult to assess due to: Impaired communication                       Following commands: Impaired Following commands impaired: Follows one step commands with increased time, Follows one step commands inconsistently     Cueing Cueing Techniques: Verbal cues, Tactile cues, Gestural cues     General Comments General comments (skin integrity, edema, etc.): MAP 70s-80s at edge of bed. HR 70s, SpO2 98% on RA.    Exercises Other Exercises Other Exercises: PROM RLE knee and hip. Other Exercises: seated balance   Assessment/Plan    PT Assessment Patient needs continued PT services  PT Problem List Decreased strength;Decreased activity tolerance;Decreased balance;Decreased mobility;Decreased cognition;Decreased coordination;Decreased knowledge of use of DME;Decreased knowledge of precautions;Cardiopulmonary status limiting activity;Impaired sensation       PT Treatment Interventions DME instruction;Functional mobility training;Therapeutic activities;Therapeutic exercise;Balance training;Cognitive remediation;Neuromuscular re-education;Patient/family education;Wheelchair mobility training    PT Goals (Current goals can be found in the Care Plan section)  Acute Rehab PT Goals Patient Stated Goal: did not state, agreeable to session PT Goal Formulation: With patient Time For  Goal Achievement: 08/05/24 Potential to Achieve Goals:  Good    Frequency Min 3X/week     Co-evaluation PT/OT/SLP Co-Evaluation/Treatment: Yes Reason for Co-Treatment: Complexity of the patient's impairments (multi-system involvement);For patient/therapist safety PT goals addressed during session: Mobility/safety with mobility;Balance OT goals addressed during session: ADL's and self-care;Strengthening/ROM       AM-PAC PT 6 Clicks Mobility  Outcome Measure Help needed turning from your back to your side while in a flat bed without using bedrails?: Total Help needed moving from lying on your back to sitting on the side of a flat bed without using bedrails?: Total Help needed moving to and from a bed to a chair (including a wheelchair)?: Total Help needed standing up from a chair using your arms (e.g., wheelchair or bedside chair)?: Total Help needed to walk in hospital room?: Total Help needed climbing 3-5 steps with a railing? : Total 6 Click Score: 6    End of Session Equipment Utilized During Treatment: Cervical collar Activity Tolerance: Patient tolerated treatment well Patient left: in bed;with bed alarm set;with call bell/phone within reach;with SCD's reapplied;with nursing/sitter in room Nurse Communication: Mobility status PT Visit Diagnosis: Muscle weakness (generalized) (M62.81);History of falling (Z91.81);Difficulty in walking, not elsewhere classified (R26.2);Other symptoms and signs involving the nervous system (R29.898);Other abnormalities of gait and mobility (R26.89);Pain Pain - part of body:  (Neck)    Time: 8944-8855 PT Time Calculation (min) (ACUTE ONLY): 49 min   Charges:     PT Treatments $Therapeutic Activity: 8-22 mins PT General Charges $$ ACUTE PT VISIT: 1 Visit         Leontine Roads, PT, DPT [facility]  Rehabilitation Services Physical Therapist Office: [phone] Website: [email or web address]   Leontine GORMAN Roads 07/25/2024, 1:32 PM

## 2024-07-26 ENCOUNTER — Inpatient Hospital Stay (HOSPITAL_COMMUNITY)

## 2024-07-26 DIAGNOSIS — I2699 Other pulmonary embolism without acute cor pulmonale: Secondary | ICD-10-CM | POA: Diagnosis not present

## 2024-07-26 DIAGNOSIS — G8253 Quadriplegia, C5-C7 complete: Secondary | ICD-10-CM | POA: Diagnosis not present

## 2024-07-26 DIAGNOSIS — G931 Anoxic brain damage, not elsewhere classified: Secondary | ICD-10-CM | POA: Diagnosis not present

## 2024-07-26 DIAGNOSIS — G4733 Obstructive sleep apnea (adult) (pediatric): Secondary | ICD-10-CM

## 2024-07-26 DIAGNOSIS — J9601 Acute respiratory failure with hypoxia: Secondary | ICD-10-CM | POA: Diagnosis not present

## 2024-07-26 DIAGNOSIS — R1310 Dysphagia, unspecified: Secondary | ICD-10-CM | POA: Diagnosis not present

## 2024-07-26 DIAGNOSIS — S14109A Unspecified injury at unspecified level of cervical spinal cord, initial encounter: Secondary | ICD-10-CM

## 2024-07-26 DIAGNOSIS — N179 Acute kidney failure, unspecified: Secondary | ICD-10-CM | POA: Diagnosis not present

## 2024-07-26 DIAGNOSIS — K7290 Hepatic failure, unspecified without coma: Secondary | ICD-10-CM

## 2024-07-26 LAB — GLUCOSE, CAPILLARY
Glucose-Capillary: 140 mg/dL — ABNORMAL HIGH (ref 70–99)
Glucose-Capillary: 154 mg/dL — ABNORMAL HIGH (ref 70–99)
Glucose-Capillary: 157 mg/dL — ABNORMAL HIGH (ref 70–99)
Glucose-Capillary: 159 mg/dL — ABNORMAL HIGH (ref 70–99)
Glucose-Capillary: 178 mg/dL — ABNORMAL HIGH (ref 70–99)
Glucose-Capillary: 203 mg/dL — ABNORMAL HIGH (ref 70–99)

## 2024-07-26 LAB — RENAL FUNCTION PANEL
Albumin: 2.2 g/dL — ABNORMAL LOW (ref 3.5–5.0)
Albumin: 2.1 g/dL — ABNORMAL LOW (ref 3.5–5.0)
Anion gap: 11 (ref 5–15)
Anion gap: 12 (ref 5–15)
BUN: 50 mg/dL — ABNORMAL HIGH (ref 8–23)
BUN: 72 mg/dL — ABNORMAL HIGH (ref 8–23)
CO2: 25 mmol/L (ref 22–32)
CO2: 22 mmol/L (ref 22–32)
Calcium: 9.2 mg/dL (ref 8.9–10.3)
Calcium: 8.9 mg/dL (ref 8.9–10.3)
Chloride: 99 mmol/L (ref 98–111)
Chloride: 101 mmol/L (ref 98–111)
Creatinine, Ser: 1.85 mg/dL — ABNORMAL HIGH (ref 0.61–1.24)
Creatinine, Ser: 2.56 mg/dL — ABNORMAL HIGH (ref 0.61–1.24)
GFR, Estimated: 37 mL/min — ABNORMAL LOW (ref >60)
GFR, Estimated: 25 mL/min — ABNORMAL LOW
Glucose, Bld: 156 mg/dL — ABNORMAL HIGH (ref 70–99)
Glucose, Bld: 189 mg/dL — ABNORMAL HIGH (ref 70–99)
Phosphorus: 2.7 mg/dL (ref 2.5–4.6)
Phosphorus: 3.6 mg/dL (ref 2.5–4.6)
Potassium: 4.4 mmol/L (ref 3.5–5.1)
Potassium: 5 mmol/L (ref 3.5–5.1)
Sodium: 135 mmol/L (ref 135–145)
Sodium: 135 mmol/L (ref 135–145)

## 2024-07-26 LAB — CBC
HCT: 25.6 % — ABNORMAL LOW (ref 39.0–52.0)
Hemoglobin: 8.2 g/dL — ABNORMAL LOW (ref 13.0–17.0)
MCH: 31.5 pg (ref 26.0–34.0)
MCHC: 32 g/dL (ref 30.0–36.0)
MCV: 98.5 fL (ref 80.0–100.0)
Platelets: 197 K/uL (ref 150–400)
RBC: 2.6 MIL/uL — ABNORMAL LOW (ref 4.22–5.81)
RDW: 17.1 % — ABNORMAL HIGH (ref 11.5–15.5)
WBC: 15.9 K/uL — ABNORMAL HIGH (ref 4.0–10.5)
nRBC: 0.4 % — ABNORMAL HIGH (ref 0.0–0.2)

## 2024-07-26 LAB — HEPARIN LEVEL (UNFRACTIONATED): Heparin Unfractionated: 0.48 [IU]/mL (ref 0.30–0.70)

## 2024-07-26 LAB — MAGNESIUM: Magnesium: 2.3 mg/dL (ref 1.7–2.4)

## 2024-07-26 MED ORDER — GLUCERNA 1.5 CAL PO LIQD
1000.0000 mL | ORAL | Status: DC
  Administered 2024-07-27 – 2024-07-29 (×4): 1000 mL
  Filled 2024-07-26 (×6): qty 1000

## 2024-07-26 MED ORDER — PROSOURCE TF20 ENFIT COMPATIBL EN LIQD: 60.0000 mL | Freq: Every day | ENTERAL | Status: DC

## 2024-07-26 NOTE — Consult Note (Signed)
 Physical Medicine and Rehabilitation Consult Reason for Consult:Spinal cord injury Referring Physician:  Dr Leita Gaskins   HPI: Lawrence [name] is a 78 y.o.  R handed male  with hx of uncontrolled DM A1c 12.3 (wife said used to be well controlled), GERD, HTN, OSA, BPH s/p TURP admitted after multiple falls at home over weeks, this last fall brought pt in on 10/25-  went for C7/T1 diskectomy and fusion, however pt sustained a PEA arrest in OR on 10/26- and then again when in ICU- pt found to have B/L large pulmonary emboli and taken for thrombectomy on 10/27.  Pt was on ventilator from 10/26 to 10/28- on CRRT since 10/29 when Cr maxxed out at 6.10. Is off CRRT currently and Cr down to 1.85.   Pt also still on Levophed due to cardiogenic shock- Is NPO due to dysphagia. 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   Of note, also has ALT of 128.      Review of Systems  Constitutional:  Positive for malaise/fatigue.  HENT: Negative.    Eyes: Negative.   Respiratory:  Negative for cough and shortness of breath.   Cardiovascular:        B/L PE's  Gastrointestinal:  Negative for constipation, diarrhea, nausea and vomiting.  Genitourinary:        Has foley-   Musculoskeletal:  Positive for back pain, falls and neck pain.  Skin: Negative.   Neurological:  Positive for tingling, sensory change, speech change, focal weakness and weakness.  Endo/Heme/Allergies: Negative.   Psychiatric/Behavioral:  Positive for depression and memory loss.   All other systems reviewed and are negative.  Past Medical History:  Diagnosis Date   Arthritis    Diabetes mellitus without complication (HCC)    GERD (gastroesophageal reflux disease)    Hypertension    Sleep apnea    Past Surgical History:  Procedure Laterality Date   ANTERIOR CERVICAL DECOMP/DISCECTOMY FUSION N/A 07/17/2024   Procedure: ANTERIOR CERVICAL DECOMPRESSION/DISCECTOMY FUSION, CERVICAL  SEVEN - THORACIC ONE;  Surgeon: Debby Dorn MATSU, MD;  Location: [facility] OR;  Service: Neurosurgery;  Laterality: N/A;  Partial Discecomy Cervical seven- Thoracic one   APPENDECTOMY     78 yo   BACK SURGERY  1975   lumbar diskectomy   done [facility]   COLONOSCOPY N/A 02/21/2015   Procedure: COLONOSCOPY;  Surgeon: Claudis RAYMOND Rivet, MD;  Location: [facility];  Service: Endoscopy;  Laterality: N/A;  1025   COLONOSCOPY  11/13/2021   COLONOSCOPY WITH PROPOFOL  N/A 11/13/2021   Procedure: COLONOSCOPY WITH PROPOFOL ;  Surgeon: Rivet Claudis RAYMOND, MD;  Location: [facility];  Service: Endoscopy;  Laterality: N/A;  155   CYSTOSCOPY WITH URETHRAL DILATATION N/A 12/09/2022   Procedure: CYSTOSCOPY WITH URETHRAL DILATATION;  Surgeon: Watt Rush, MD;  Location: [facility] OR[facility];  Service: Urology;  Laterality: N/A;   EYE SURGERY Bilateral    Lasik surgery   IR ANGIOGRAM PULMONARY BILATERAL SELECTIVE  07/18/2024   IR ANGIOGRAM SELECTIVE EACH ADDITIONAL VESSEL  07/18/2024   IR THROMBECT PRIM MECH ADD (INCLU) MOD SED  07/18/2024   IR THROMBECT PRIM MECH ADD (INCLU) MOD SED  07/18/2024   IR THROMBECT PRIM MECH ADD (INCLU) MOD SED  07/18/2024   LEFT HEART CATH AND CORONARY ANGIOGRAPHY N/A 12/23/2020   Procedure: LEFT HEART CATH AND CORONARY ANGIOGRAPHY;  Surgeon: Burnard Debby LABOR, MD;  Location: [facility] INV[facility];  Service:  Cardiovascular;  Laterality: N/A;   NECK SURGERY  1999   Work injury, posterior fusion   POLYPECTOMY  11/13/2021   Procedure: POLYPECTOMY INTESTINAL;  Surgeon: Golda Claudis PENNER, MD;  Location: [facility];  Service: Endoscopy;;   SHOULDER SURGERY Bilateral 2000   rotoator cuff repair   done at [facility]   TONSILLECTOMY     TRANSURETHRAL INCISION OF PROSTATE N/A 12/09/2022   Procedure: TRANSURETHRAL RESECTION OF PROSTATE;  Surgeon: Watt Rush, MD;  Location: [facility] ORS;  Service: Urology;  Laterality: N/A;  1 HR FOR CASE   History reviewed. No pertinent family history. Social History:   reports that he has never smoked. He has never used smokeless tobacco. He reports that he does not drink alcohol and does not use drugs. Allergies:  Allergies  Allergen Reactions   Penicillins Hives   Lucentis [Ranibizumab] Rash   Medications Prior to Admission  Medication Sig Dispense Refill   Apple Cider Vinegar 600 MG CAPS Take 600 mg by mouth daily.     aspirin  EC 81 MG tablet Take 1 tablet (81 mg total) by mouth every other day. 30 tablet 11   Barberry-Oreg Grape-Goldenseal (BERBERINE COMPLEX PO) Take 1 capsule by mouth daily.     cholecalciferol (VITAMIN D3) 25 MCG (1000 UNIT) tablet Take 1,000 Units by mouth daily.     Cinnamon 500 MG capsule Take 500 mg by mouth daily.     diphenhydrAMINE HCl (NERVINE PO) Take 1 tablet by mouth daily. Patient states that he takes one a day     empagliflozin (JARDIANCE) 10 MG TABS tablet Take 10 mg by mouth daily.     ferrous sulfate 325 (65 FE) MG tablet Take 325 mg by mouth daily with breakfast.     hydrochlorothiazide  (HYDRODIURIL ) 25 MG tablet Take 25 mg by mouth daily.     insulin  degludec (TRESIBA  FLEXTOUCH) 100 UNIT/ML FlexTouch Pen Inject 12 Units into the skin at bedtime. (Patient taking differently: Inject 28 Units into the skin 2 (two) times daily.) 9 mL 3   Multiple Vitamins-Minerals (OCUVITE ADULT 50+ PO) Take 1 tablet by mouth daily.     OVER THE COUNTER MEDICATION Take 1 capsule by mouth daily. Prostagenix     OVER THE COUNTER MEDICATION Take 1 capsule by mouth daily. Diabacore (Diabetic Supplement)     OVER THE COUNTER MEDICATION Take 1 tablet by mouth in the morning and at bedtime. Glucoceil     tadalafil  (CIALIS ) 5 MG tablet Take 1 tablet (5 mg total) by mouth daily as needed for erectile dysfunction. 30 tablet 11   vitamin E 180 MG (400 UNITS) capsule Take 400 Units by mouth daily.     Vitamin Mixture (ESTER-C PO) Take 1,000 mg by mouth daily.      Home: Home Living Family/patient expects to be discharged to:: Private  residence Living Arrangements: Spouse/significant other Available Help at Discharge: Family, Available 24 hours/day (wife uses a cane) Type of Home: House Home Access: Stairs to enter Secretary/administrator of Steps: 4 Home Layout: Two level, Bed/bath upstairs, Able to live on main level with bedroom/bathroom, Full bath on main level Bathroom Shower/Tub: Health Visitor: Standard Home Equipment: [employer] - single point, BSC/3in1, Shower seat - built in, [employer] - toilet, Grab bars - tub/shower, Hand held shower head, Wheelchair - manual Additional Comments: unsure of accuracy of information as pt is unreliable historian and no family present  Functional History: Prior Function Prior Level of Function : Independent/Modified Independent, Patient  poor historian/Family not available, History of Falls (last six months) Mobility Comments: Used cane, several recent falls Functional Status:  Mobility: Bed Mobility Overal bed mobility: Needs Assistance Bed Mobility: Sidelying to Sit, Sit to Supine Rolling: Total assist, +2 for physical assistance Sidelying to sit: Total assist, +2 for physical assistance Sit to supine: Total assist, +2 for physical assistance General bed mobility comments: pt tolerating bed/chair positiong x25 mins Transfers General transfer comment: deferred Ambulation/Gait General Gait Details: unable, cannot move legs    ADL: ADL General ADL Comments: dependent at this time, but potential to work toward use of AE for self-feeding/grooming tasks  Cognition: Cognition Orientation Level: Oriented to person, Oriented to place, Oriented to situation Cognition Arousal: Alert Behavior During Therapy: Flat affect  Blood pressure (!) 136/46, pulse 80, temperature 97.8 F (36.6 C), temperature source Axillary, resp. rate 16, height 6' 1 (1.854 m), weight 73.8 kg, SpO2 99%. Physical Exam Vitals and nursing note reviewed. Exam conducted with a chaperone  present.  Constitutional:      Comments: Pt awake, alert, vague; son, mother and daughter at bedside; pt in Heart ICU, no acute distress  HENT:     Head: Normocephalic and atraumatic.     Comments: Smile equal Tongue midline    Right Ear: External ear normal.     Left Ear: External ear normal.     Nose: Nose normal. No congestion.     Comments: Has cortrak in place- is NPO    Mouth/Throat:     Mouth: Mucous membranes are dry.     Pharynx: Oropharynx is clear. No oropharyngeal exudate or posterior oropharyngeal erythema.     Comments: Due to NPO- is dry Eyes:     General:        Right eye: No discharge.        Left eye: No discharge.     Extraocular Movements: Extraocular movements intact.  Neck:     Comments: Large bandage on L anterior neck- with honeycomb- in cervical collar- appears to have SubQ hematoma- has associated L chest heavy purple yellow bruising  Cardiovascular:     Rate and Rhythm: Normal rate and regular rhythm.     Heart sounds: Normal heart sounds. No murmur heard.    No gallop.  Pulmonary:     Effort: Pulmonary effort is normal. No respiratory distress.     Breath sounds: Normal breath sounds. No wheezing, rhonchi or rales.  Abdominal:     General: Bowel sounds are normal. There is no distension.     Palpations: Abdomen is soft.     Tenderness: There is no abdominal tenderness.  Genitourinary:    Comments: Has foley- medium amber urine Musculoskeletal:     Comments:  RUE- biceps 3-/5; WE 3-/5; Triceps 2/5; Grip 1/5 and FA 1/5 LUE- Biceps 4/5; WE 3-/5; Triceps 4-/5; Grip 2-/5 and FA 2-/5 0/5 in LE's  Skin:    General: Skin is warm and dry.     Comments: B/L UE IV's- B/L forearms- look OK L chest bruising  Neurological:     Mental Status: He is alert.     Comments: Flat affect Very vague in answering questions Thinks May 2025- but was able to say next holiday is Thanksgiving Knows at [facility]- but per family didn't know earlier  When naming-  was able to get pen; however when asked to name watch, named it red octagon; did name hair No hoffman's no Clonus; no increased tone Sensation- absent from T10 downwards,  however decreased from C7 downwards  Psychiatric:     Comments: Flat vague affect, depressed     Results for orders placed or performed during the hospital encounter of 07/16/24 (from the past 24 hours)  Glucose, capillary     Status: Abnormal   Collection Time: 07/25/24  8:00 PM  Result Value Ref Range   Glucose-Capillary 177 (H) 70 - 99 mg/dL  Glucose, capillary     Status: Abnormal   Collection Time: 07/25/24 11:43 PM  Result Value Ref Range   Glucose-Capillary 135 (H) 70 - 99 mg/dL  Heparin  level (unfractionated)     Status: None   Collection Time: 07/26/24  3:40 AM  Result Value Ref Range   Heparin  Unfractionated 0.48 0.30 - 0.70 IU/mL  Glucose, capillary     Status: Abnormal   Collection Time: 07/26/24  3:52 AM  Result Value Ref Range   Glucose-Capillary 159 (H) 70 - 99 mg/dL  Renal function panel (daily at 0500)     Status: Abnormal   Collection Time: 07/26/24  3:54 AM  Result Value Ref Range   Sodium 135 135 - 145 mmol/L   Potassium 4.4 3.5 - 5.1 mmol/L   Chloride 99 98 - 111 mmol/L   CO2 25 22 - 32 mmol/L   Glucose, Bld 156 (H) 70 - 99 mg/dL   BUN 50 (H) 8 - 23 mg/dL   Creatinine, Ser 8.14 (H) 0.61 - 1.24 mg/dL   Calcium 9.2 8.9 - 89.6 mg/dL   Phosphorus 2.7 2.5 - 4.6 mg/dL   Albumin 2.2 (L) 3.5 - 5.0 g/dL   GFR, Estimated 37 (L) >60 mL/min   Anion gap 11 5 - 15  Magnesium     Status: None   Collection Time: 07/26/24  3:54 AM  Result Value Ref Range   Magnesium 2.3 1.7 - 2.4 mg/dL  CBC     Status: Abnormal   Collection Time: 07/26/24  3:54 AM  Result Value Ref Range   WBC 15.9 (H) 4.0 - 10.5 K/uL   RBC 2.60 (L) 4.22 - 5.81 MIL/uL   Hemoglobin 8.2 (L) 13.0 - 17.0 g/dL   HCT 74.3 (L) 60.9 - 47.9 %   MCV 98.5 80.0 - 100.0 fL   MCH 31.5 26.0 - 34.0 pg   MCHC 32.0 30.0 - 36.0 g/dL   RDW 82.8  (H) 88.4 - 15.5 %   Platelets 197 150 - 400 K/uL   nRBC 0.4 (H) 0.0 - 0.2 %  Glucose, capillary     Status: Abnormal   Collection Time: 07/26/24  7:26 AM  Result Value Ref Range   Glucose-Capillary 157 (H) 70 - 99 mg/dL  Glucose, capillary     Status: Abnormal   Collection Time: 07/26/24 11:46 AM  Result Value Ref Range   Glucose-Capillary 140 (H) 70 - 99 mg/dL  Glucose, capillary     Status: Abnormal   Collection Time: 07/26/24  3:32 PM  Result Value Ref Range   Glucose-Capillary 154 (H) 70 - 99 mg/dL  Renal function panel (daily at 1600)     Status: Abnormal   Collection Time: 07/26/24  4:26 PM  Result Value Ref Range   Sodium 135 135 - 145 mmol/L   Potassium 5.0 3.5 - 5.1 mmol/L   Chloride 101 98 - 111 mmol/L   CO2 22 22 - 32 mmol/L   Glucose, Bld 189 (H) 70 - 99 mg/dL   BUN 72 (H) 8 - 23 mg/dL   Creatinine, Ser 7.43 (  H) 0.61 - 1.24 mg/dL   Calcium 8.9 8.9 - 89.6 mg/dL   Phosphorus 3.6 2.5 - 4.6 mg/dL   Albumin 2.1 (L) 3.5 - 5.0 g/dL   GFR, Estimated 25 (L) >60 mL/min   Anion gap 12 5 - 15   CT CERVICAL SPINE WO CONTRAST Addendum Date: 07/26/2024 ADDENDUM REPORT: 07/26/2024 17:11 ADDENDUM: These results were called by telephone at the time of interpretation on 07/26/2024 at 3:27 p.m. to provider Leita Gleason, PA, who verbally acknowledged these results. Electronically Signed   By: Toribio Agreste M.D.   On: 07/26/2024 17:11   Result Date: 07/26/2024 CLINICAL DATA:  Cervical trauma. Known left C7 transverse process fracture. EXAM: CT CERVICAL SPINE WITHOUT CONTRAST TECHNIQUE: Multidetector CT imaging of the cervical spine was performed without intravenous contrast. Multiplanar CT image reconstructions were also generated. RADIATION DOSE REDUCTION: This exam was performed according to the departmental dose-optimization program which includes automated exposure control, adjustment of the mA and/or kV according to patient size and/or use of iterative reconstruction technique.  COMPARISON:  None Available. FINDINGS: Alignment: Normal. Skull base and vertebrae: Vertebral body heights are normal. There is moderate spondylosis throughout the cervical spine to include uncovertebral joint spurring and facet arthropathy. Evidence of patient's known displaced left C7 transverse process fracture. Right-sided neural foraminal narrowing at the C3-4 level and C4-5 levels. Minimal left-sided neural from narrowing at the C6-7 level. Soft tissues and spinal canal: Prevertebral soft tissues are normal. Mild narrowing of the AP diameter of the spinal canal the C5-6 level. Disc levels: Minimal disc space narrowing at multiple levels from the C4-5 level to the C6-7 level. Upper chest: Partially visualized right IJ central venous catheter as well as enteric tube. No acute findings over the visualized upper chest and lungs. Other: There is a new 3.5 x 5.8 cm slightly hyperdense collection over the left anterior neck at the level of the thyroid gland suggesting a contained hematoma. IMPRESSION: 1. Evidence of patient's known displaced left C7 transverse process fracture. 2. Moderate spondylosis throughout the cervical spine with minimal disc disease at multiple levels. Mild narrowing of the AP diameter of the spinal canal at the C5-6 level. 3. New 3.5 x 5.8 cm slightly hyperdense collection over the left anterior neck at the level of the thyroid gland suggesting a contained hematoma. Currently paging provider. Electronically Signed: By: Toribio Agreste M.D. On: 07/26/2024 14:57     Assessment/Plan: Diagnosis: C5 Complete quadriplegia (doesn't fit injury level?) with zone of partial perseveration from C6-C7 Does the need for close, 24 hr/day medical supervision in concert with the patient's rehab needs make it unreasonable for this patient to be served in a less intensive setting? 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 Yes Does the patient require coordinated care of a physician, rehab nurse, therapy disciplines of PT, OT and SLP to address physical and functional deficits in the context of the above medical diagnosis(es)? Yes Addressing deficits in the following areas: balance, endurance, locomotion, strength, transferring, bowel/bladder control, bathing, dressing, feeding, grooming, toileting, cognition, and swallowing Can the patient actively participate in an intensive therapy program of at least 3 hrs of therapy per day at  least 5 days per week? Yes The potential for patient to make measurable gains while on inpatient rehab is good and fair Anticipated functional outcomes upon discharge from inpatient rehab are min assist and mod assist  with PT, min assist and mod assist with OT, min assist with SLP. Estimated rehab length of stay to reach the above functional goals is: 4 weeks Anticipated discharge destination: Home Overall Rehab/Functional Prognosis: good and fair  RECOMMENDATIONS: This patient's condition is appropriate for continued rehabilitative care in the following setting: CIR Patient has agreed to participate in recommended program. Yes Note that insurance prior authorization may be required for reimbursement for recommended care.  Comment:  Pt needs a daily suppository - I suggest after lunch within ~ 1 hour to start to train neurogenic bowel  If possible, suggest an air mattress and continue to turn pt q2 hours to prevent pressure ulcers.  Pt very depressed- son uses Lexapro- so  genetically, suggest 10 mg daily.   I did not place orders- without consent Of  note, noted pt's ALT is 128- isolated. Also strongly suggest Strattera (start ~ 20 mg daily) for pt- once he is able to swallow- it works really well for SCI related hypotension.     Thank you for this consult- will d/w admissions coordinators.    I spent a total of 93   minutes on total care today- >50% coordination of care- due to  D/w pt, as well as wife/son and daughter, however also had another d/w son in hallway- reviewed chart, examined and interviewed pt and family; and documentation- also discussed possible prognosis- with Anoxic brain injury as well as Quadriplegia- explained legs less likely to come back, but we will see- esp if is able to have another surgery.    Uziel Covault, MD 07/26/2024

## 2024-07-26 NOTE — Progress Notes (Addendum)
 Occupational Therapy Treatment Patient Details Name: Lawrence [name] MRN: [id number] DOB: 31-May-1946 Today's Date: 07/26/2024   History of present illness Pt is a 78 y.o. male who presented 07/16/24 s/p fall in which he hit his neck and head. MRI showed cord signal change C7-T1 concerning for spinal cord infarct or possibly transverse myelitis. Imaging also showed hairline fracture of the T1 body, mild PLC edema without disruption. S/p aborted partial anterior cervical discectomy C7-T1 due to pt going into PEA cardiac arrest 10/26. Went into PEA cardiac arrest again in ICU. Pt found to have large bil PE. S/p mechanical thrombectomy 10/27. ETT 10/26-10/28. Started CRRT 10/29-11/4. PMH: arthritis, DM, GERD, HTN, sleep apnea, BPH status post TURP   OT comments  Pt progressing toward goals, tolerates bed/chair position x25 mins for BUE therex, family present and educated on splint wear (night time use only) and positioning, along with RN. Pt at times ?hallucinating or talking about ?events on TV. Following commands, but oriented to self and DOB at this time. Pt presenting with impairments listed below, will follow acutely. Patient will benefit from intensive inpatient follow-up therapy, >3 hours/day to maximize safety/ind with ADL/functional mobility.       If plan is discharge home, recommend the following:  Two people to help with walking and/or transfers;Two people to help with bathing/dressing/bathroom;Assistance with cooking/housework;Assistance with feeding;Direct supervision/assist for medications management;Direct supervision/assist for financial management;Assist for transportation;Help with stairs or ramp for entrance   Equipment Recommendations  Wheelchair (measurements OT);Wheelchair cushion (measurements OT);Hospital bed;Hoyer lift    Recommendations for Other Services PT consult;Rehab consult;Speech consult    Precautions / Restrictions Precautions Precautions: Fall;Cervical Precaution  Booklet Issued: No Precaution/Restrictions Comments: A-line, MAP > 65, cardiac arrest x2 this admission Required Braces or Orthoses: Cervical Brace Cervical Brace: Hard collar;At all times Restrictions Weight Bearing Restrictions Per Provider Order: No       Mobility Bed Mobility               General bed mobility comments: pt tolerating bed/chair positiong x25 mins    Transfers                   General transfer comment: deferred     Balance                                           ADL either performed or assessed with clinical judgement   ADL                                         General ADL Comments: dependent at this time, but potential to work toward use of AE for self-feeding/grooming tasks    Extremity/Trunk Assessment Upper Extremity Assessment Upper Extremity Assessment: Right hand dominant RUE Deficits / Details: 2/5 shoulder, 3/5 elbow, grasp intermittent/reflexive at tiems RUE Coordination: decreased fine motor;decreased gross motor LUE Deficits / Details: 2/5 shoulder with tightness > 90 degrees FF, 2+/5 elbow, can bring thumb to 2nd digit but unable to perform full grasp LUE Coordination: decreased fine motor;decreased gross motor   Lower Extremity Assessment Lower Extremity Assessment: Defer to PT evaluation        Vision   Vision Assessment?: No apparent visual deficits   Perception Perception Perception: Not tested   Praxis Praxis Praxis: Not tested  Communication Communication Communication: Impaired Factors Affecting Communication: Reduced clarity of speech   Cognition Arousal: Alert Behavior During Therapy: Flat affect Cognition: Cognition impaired   Orientation impairments: Time, Place (states it is May and he is in [place])   Memory impairment (select all impairments): Declarative long-term memory, Short-term memory Attention impairment (select first level of impairment):  Sustained attention (often distracted by TV during session) Executive functioning impairment (select all impairments): Sequencing OT - Cognition Comments: at times appears to ?hallucinate or recalling events from TV show, talking about elevators blowing up                 Following commands: Impaired Following commands impaired: Follows one step commands with increased time, Follows one step commands inconsistently      Cueing   Cueing Techniques: Verbal cues, Tactile cues, Gestural cues  Exercises Other Exercises Other Exercises: AAROM bil shoulder flex Other Exercises: AAROM bil elbow flex/ext Other Exercises: AAROM bil wrist flex/ext Other Exercises: AAROM bil digit composite flex/ext    Shoulder Instructions       General Comments VSS on RA; educated family and RN on splint wear for positioning/night time use only, sign hung up in room    Pertinent Vitals/ Pain       Pain Assessment Pain Assessment: Faces Pain Score: 4  Faces Pain Scale: Hurts little more Pain Location: R hand with passive digit extension Pain Descriptors / Indicators: Discomfort, Grimacing Pain Intervention(s): Limited activity within patient's tolerance, Monitored during session, Repositioned  Home Living                                          Prior Functioning/Environment              Frequency  Min 2X/week        Progress Toward Goals  OT Goals(current goals can now be found in the care plan section)  Progress towards OT goals: Progressing toward goals  Acute Rehab OT Goals OT Goal Formulation: With patient Time For Goal Achievement: 08/05/24 Potential to Achieve Goals: Good ADL Goals Pt Will Perform Eating: bed level;sitting;with min assist Pt Will Perform Grooming: with min assist;sitting;bed level Pt/caregiver will Perform Home Exercise Program: Increased strength;Both right and left upper extremity;Increased ROM;With minimal assist Additional ADL  Goal #1: Pt will activate soft touch call button to request assistance. Additional ADL Goal #2: Pt will roll with moderate assistance for positioning and pericare.  Plan      Co-evaluation                 AM-PAC OT 6 Clicks Daily Activity     Outcome Measure   Help from another person eating meals?: Total Help from another person taking care of personal grooming?: Total Help from another person toileting, which includes using toliet, bedpan, or urinal?: Total Help from another person bathing (including washing, rinsing, drying)?: Total Help from another person to put on and taking off regular upper body clothing?: Total Help from another person to put on and taking off regular lower body clothing?: Total 6 Click Score: 6    End of Session Equipment Utilized During Treatment: Cervical collar  OT Visit Diagnosis: Muscle weakness (generalized) (M62.81);Pain;Other symptoms and signs involving cognitive function   Activity Tolerance Patient tolerated treatment well   Patient Left in bed;with call bell/phone within reach;with bed alarm set;with nursing/sitter in room   Nurse  Communication Mobility status        Time: 8499-8461 OT Time Calculation (min): 38 min  Charges: OT General Charges $OT Visit: 1 Visit OT Treatments $Therapeutic Activity: 38-52 mins  Lawrence [name], OTD, OTR/L SecureChat Preferred Acute Rehab [phone]   Lawrence [name] 07/26/2024, 5:38 PM

## 2024-07-26 NOTE — Progress Notes (Signed)
 Inpatient Rehab Admissions Coordinator:   Per nephrology holding CRRT and monitoring for renal recovery today.  Dr. Lovorn to consult for PMR today.  I will f/u at bedside tomorrow.   Reche Lowers, PT, DPT Admissions Coordinator [phone] 07/26/24 12:03 PM

## 2024-07-26 NOTE — Progress Notes (Signed)
 Speech Language Pathology Treatment: Dysphagia  Patient Details Name: Maximilliano Kersh MRN: [id number] DOB: 12-15-1945 Today's Date: 07/26/2024 Time: 8294-8277 SLP Time Calculation (min) (ACUTE ONLY): 17 min  Assessment / Plan / Recommendation Clinical Impression  Patient was awake and alert. Patient's wife and daughter were at bedside, patient correctly identified them. Patient talked about scenes from TV throughout the session. Today's session focused on family education, working on breath support, and ice chips trials. Patient's voiced was increasingly louder when working with SLP taking a deep breath before vocalizing. Patient practiced this 10 different times throughout the session. SLP worked on breath support by practicing taking deep breaths and blowing out. Patient was successful in 2 out of 4 trials of this exercise. Towards the end of the session, patient had two ice chips. An immediate cough and delayed throat clear was observed. SLP recommending continue NPO at this time. SLP educated family on mouth moisturizer to help patient's dry mouth. SLP will continue to follow for PO trials and breathing exercises.    HPI HPI: 78 yo admitted 10/26 s/p fall with onset of numbness in BLE. Neurosurgery consulted, pt taken to OR for partial anterior cervical discectomy but procedure aborted secondary to multiple PEA arrests, intubated. Pt with acute biventricular HF, anuric AKI requiring initiation of CRRT. ETT 10/26-28. Mechanical thrombectory for bilateral PE 10/27. Cortrak 10/29. PMH includes DM, HTN, BPH s/p TURP      SLP Plan  Continue with current plan of care          Recommendations  Diet recommendations: NPO Medication Administration: Via alternative means                  Oral care QID   Other (comment) Dysphagia, oropharyngeal phase (R13.12)     Continue with current plan of care     Damien Hy  Graduate SLP Clinican

## 2024-07-26 NOTE — Progress Notes (Signed)
 PHARMACY - ANTICOAGULATION CONSULT NOTE  Pharmacy Consult for heparin  Indication: pulmonary embolus  Allergies  Allergen Reactions   Penicillins Hives   Lucentis [Ranibizumab] Rash    Patient Measurements: Height: 6' 1 (185.4 cm) Weight: 73.8 kg (162 lb 11.2 oz) IBW/kg (Calculated) : 79.9 HEPARIN  DW (KG): 81.8  Vital Signs: Temp: 97.8 F (36.6 C) (11/04 0700) Temp Source: Axillary (11/04 0700) BP: 138/46 (11/04 1300) Pulse Rate: 74 (11/04 1330)  Labs: Recent Labs     0000 07/24/24 0445 07/24/24 0732 07/24/24 1550 07/25/24 0401 07/25/24 1635 07/26/24 0340 07/26/24 0354  HGB   < >  --  7.8*  --  8.1*  --   --  8.2*  HCT  --   --  23.8*  --  24.8*  --   --  25.6*  PLT  --   --  148*  --  186  --   --  197  HEPARINUNFRC  --  0.55  --   --  0.56  --  0.48  --   CREATININE  --  2.13*  --    < > 1.86* 1.81*  --  1.85*   < > = values in this interval not displayed.    Estimated Creatinine Clearance: 35.5 mL/min (A) (by C-G formula based on SCr of 1.85 mg/dL (H)).   Assessment: JC is a 16 YOM admitted with concern for spinal cord injury. Patient taken to the OR for neurosurgery, procedure complicated by multiple rounds of CPR.   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 S/p IR 10/27 for thrombectomy.  Heparin  level 0.48 is therapeutic with heparin  running at 1200 units/hr. Hgb stable 8 and PLTs 200 stable.  No bleeding noted    Goal of Therapy:  Heparin  level 0.3-0.7 units/ml Monitor platelets by anticoagulation protocol: Yes   Plan:  Continue heparin  infusion at 1200 units/hr Monitor daily heparin  level, CBC, and signs/symptoms of bleeding F/u plans for transition to oral [facility] when procedures complete     Olam Chalk Pharm.D. CPP, BCPS Clinical Pharmacist [phone] 07/26/2024 2:47 PM   **Pharmacist phone directory can now be found on [email or web address] ([email or web address]).  Listed under [facility].    07/26/2024   2:45 PM

## 2024-07-26 NOTE — Progress Notes (Signed)
 Lawrence [name] is an 78 y.o. male diabetes, hypertension, BPH status post TURP initially presenting after a ground-level fall about 5 days prior to admission. Of note over the past few weeks he has had neuropathy in his feet, felt more unsteady and used a cane because of his neuropathy.  Unfortunately he slipped and fell and hit his head on the sink followed by new onset numbness in his legs, inability to sit up and loss of function in his legs as well as urinary incontinence.  He was to have a partial anterior cervical discectomy C7-T1 to stabilize even though he was not likely to regain strength in his lower extremities but during the operative course he developed PEA arrest x 2 with subsequent CT scan showing a massive bilateral PE with poor lung perfusion.  Patient also had successful thrombectomy performed by VIR removing a large clot burden at the right PA bifurcation as well as a large clot burden in the left lower lung lobe. BL cr is approximately 0.9-1.1 (12/02/22) but with his complicated course he has developed renal failure with very poor urine output.  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 - He does not want long-term dialysis but is willing to give a trial with short-term dialysis to give his kidneys a chance to recover -Initially anuric with K trending up --> started CRRT on 10/29 mid afternoon. CVP 6 tolerating UF 50 ml/hr, no issues with filter clotting. Neg during hospitalization. Likely will be a prolonged course given significant renal injury, hopefully no cortical necrosis.   He's going for a CT today; hold CRRT afterwards and will monitor for any signs of renal recovery.  Had to change to 2K dialysate bath on 10/31 (K trending up), appears to be stabilizing; UF goal 70ml/hr as he is a little before what he came in as. Cont 2K for dialysate  Phos  repleted.  -Monitor Daily I/Os, Daily weight  -Maintain MAP>65 for optimal renal perfusion.  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 Acute respiratory failure -off sedation and alert, and possible extubation trial pending weaning parameters Cardiogenic shock secondary to pulmonary embolus -off epinephrine, still on Levophed by cardiology  Subjective: Denies fever, chills, nausea, SOB; CVP 6. Spouse and children updated.   Chemistry and CBC: Creatinine  Date/Time Value Ref Range Status  02/21/2021 12:00 AM 1.2 0.6 - 1.3 Final  10/27/2020 12:00 AM 0.9 0.6 - 1.3 Final   Creatinine, Ser  Date/Time Value Ref Range Status  07/26/2024 03:54 AM 1.85 (H) 0.61 - 1.24 mg/dL Final  88/96/7974 95:64 PM 1.81 (H) 0.61 - 1.24 mg/dL Final  88/96/7974 95:98 AM 1.86 (H) 0.61 - 1.24 mg/dL Final  88/97/7974 96:49 PM 1.91 (H) 0.61 - 1.24 mg/dL Final  88/97/7974 95:54 AM 2.13 (H) 0.61 - 1.24 mg/dL Final  88/98/7974 95:99 PM 2.33 (H) 0.61 - 1.24 mg/dL Final  88/98/7974 96:69 AM 2.23 (H) 0.61 - 1.24 mg/dL Final  89/68/7974 95:94 PM 2.36 (H) 0.61 - 1.24 mg/dL Final  89/68/7974 96:65 AM 2.57 (H) 0.61 - 1.24 mg/dL Final  89/69/7974 95:49 PM 3.03 (H) 0.61 - 1.24  mg/dL Final  89/69/7974 95:83 AM 4.16 (H) 0.61 - 1.24 mg/dL Final  89/70/7974 95:60 PM 5.73 (H) 0.61 - 1.24 mg/dL Final  89/70/7974 94:86 AM 6.10 (H) 0.61 - 1.24 mg/dL Final  89/71/7974 90:99 PM 5.41 (H) 0.61 - 1.24 mg/dL Final  89/71/7974 97:46 PM 5.16 (H) 0.61 - 1.24 mg/dL Final  89/71/7974 98:44 AM 4.18 (H) 0.61 - 1.24 mg/dL Final  89/72/7974 95:83 PM 3.69 (H) 0.61 - 1.24 mg/dL Final  89/72/7974 95:82 AM 2.90 (H) 0.61 - 1.24 mg/dL Final  89/73/7974 90:59 PM 2.37  (H) 0.61 - 1.24 mg/dL Final  89/73/7974 97:48 PM 1.50 (H) 0.61 - 1.24 mg/dL Final  89/73/7974 95:74 AM 0.99 0.61 - 1.24 mg/dL Final  89/74/7974 92:40 PM 1.08 0.61 - 1.24 mg/dL Final  96/87/7975 89:41 AM 1.02 0.61 - 1.24 mg/dL Final  97/79/7976 98:95 PM 0.92 0.61 - 1.24 mg/dL Final  95/96/7977 93:65 PM 0.86 0.61 - 1.24 mg/dL Final   Recent Labs  Lab 07/23/24 0330 07/23/24 1600 07/24/24 0445 07/24/24 1550 07/25/24 0401 07/25/24 1635 07/26/24 0354  NA 133* 133* 134* 135 134* 134* 135  K 5.1 5.3* 4.9 4.7 4.6 4.5 4.4  CL 100 100 100 100 98 100 99  CO2 23 23 23 24 23 22 25   GLUCOSE 291* 262* 243* 235* 256* 266* 156*  BUN 54* 53* 49* 48* 47* 51* 50*  CREATININE 2.23* 2.33* 2.13* 1.91* 1.86* 1.81* 1.85*  CALCIUM 8.0* 8.0* 8.5* 8.8* 8.8* 9.0 9.2  PHOS 2.0*  1.8* 4.4 3.1  3.0 3.1 3.1 2.6 2.7   Recent Labs  Lab 07/23/24 0330 07/24/24 0732 07/25/24 0401 07/26/24 0354  WBC 14.3* 16.4* 15.9* 15.9*  HGB 7.6* 7.8* 8.1* 8.2*  HCT 23.1* 23.8* 24.8* 25.6*  MCV 94.3 96.0 96.9 98.5  PLT 135* 148* 186 197   Liver Function Tests: Recent Labs  Lab 07/25/24 0401 07/25/24 1635 07/26/24 0354  AST  --  31  --   ALT  --  128*  --   ALKPHOS  --  79  --   BILITOT  --  1.0  --   PROT  --  5.7*  --   ALBUMIN 2.0* 2.1*  2.1* 2.2*   No results for input(s): LIPASE, AMYLASE in the last 168 hours.  No results for input(s): AMMONIA in the last 168 hours. Cardiac Enzymes: No results for input(s): CKTOTAL, CKMB, CKMBINDEX, TROPONINI in the last 168 hours.  Iron Studies: No results for input(s): IRON, TIBC, TRANSFERRIN, FERRITIN in the last 72 hours. PT/INR: @LABRCNTIP (inr:5)  Xrays/Other Studies: ) Results for orders placed or performed during the hospital encounter of 07/16/24 (from the past 48 hours)  Glucose, capillary     Status: Abnormal   Collection Time: 07/24/24 11:48 AM  Result Value Ref Range   Glucose-Capillary 280 (H) 70 - 99 mg/dL    Comment: Glucose  reference range applies only to samples taken after fasting for at least 8 hours.  Renal function panel (daily at 1600)     Status: Abnormal   Collection Time: 07/24/24  3:50 PM  Result Value Ref Range   Sodium 135 135 - 145 mmol/L   Potassium 4.7 3.5 - 5.1 mmol/L   Chloride 100 98 - 111 mmol/L   CO2 24 22 - 32 mmol/L   Glucose, Bld 235 (H) 70 - 99 mg/dL    Comment: Glucose reference range applies only to samples taken after fasting for at least 8 hours.   BUN 48 (H) 8 -  23 mg/dL   Creatinine, Ser 8.08 (H) 0.61 - 1.24 mg/dL   Calcium 8.8 (L) 8.9 - 10.3 mg/dL   Phosphorus 3.1 2.5 - 4.6 mg/dL   Albumin 2.1 (L) 3.5 - 5.0 g/dL   GFR, Estimated 36 (L) >60 mL/min    Comment: (NOTE) Calculated using the CKD-EPI Creatinine Equation (2021)    Anion gap 11 5 - 15    Comment: Performed at [facility] Lab, [address] [place], [place] [address]  Glucose, capillary     Status: Abnormal   Collection Time: 07/24/24  3:50 PM  Result Value Ref Range   Glucose-Capillary 221 (H) 70 - 99 mg/dL    Comment: Glucose reference range applies only to samples taken after fasting for at least 8 hours.  Glucose, capillary     Status: Abnormal   Collection Time: 07/24/24  7:10 PM  Result Value Ref Range   Glucose-Capillary 228 (H) 70 - 99 mg/dL    Comment: Glucose reference range applies only to samples taken after fasting for at least 8 hours.  Glucose, capillary     Status: Abnormal   Collection Time: 07/24/24 11:06 PM  Result Value Ref Range   Glucose-Capillary 325 (H) 70 - 99 mg/dL    Comment: Glucose reference range applies only to samples taken after fasting for at least 8 hours.  Glucose, capillary     Status: Abnormal   Collection Time: 07/25/24  3:08 AM  Result Value Ref Range   Glucose-Capillary 236 (H) 70 - 99 mg/dL    Comment: Glucose reference range applies only to samples taken after fasting for at least 8 hours.  Heparin  level (unfractionated)     Status: None   Collection Time:  07/25/24  4:01 AM  Result Value Ref Range   Heparin  Unfractionated 0.56 0.30 - 0.70 IU/mL    Comment: (NOTE) The clinical reportable range upper limit is being lowered to >1.10 to align with the FDA approved guidance for the current laboratory assay.  If heparin  results are below expected values, and patient dosage has  been confirmed, suggest follow up testing of antithrombin III levels. Performed at [facility] Lab, [address] [place], [place] [address]   Renal function panel (daily at 0500)     Status: Abnormal   Collection Time: 07/25/24  4:01 AM  Result Value Ref Range   Sodium 134 (L) 135 - 145 mmol/L   Potassium 4.6 3.5 - 5.1 mmol/L   Chloride 98 98 - 111 mmol/L   CO2 23 22 - 32 mmol/L   Glucose, Bld 256 (H) 70 - 99 mg/dL    Comment: Glucose reference range applies only to samples taken after fasting for at least 8 hours.   BUN 47 (H) 8 - 23 mg/dL   Creatinine, Ser 8.13 (H) 0.61 - 1.24 mg/dL   Calcium 8.8 (L) 8.9 - 10.3 mg/dL   Phosphorus 3.1 2.5 - 4.6 mg/dL   Albumin 2.0 (L) 3.5 - 5.0 g/dL   GFR, Estimated 37 (L) >60 mL/min    Comment: (NOTE) Calculated using the CKD-EPI Creatinine Equation (2021)    Anion gap 13 5 - 15    Comment: Performed at [facility] Lab, [address] [place], [place] [address]  Magnesium     Status: None   Collection Time: 07/25/24  4:01 AM  Result Value Ref Range   Magnesium 2.0 1.7 - 2.4 mg/dL    Comment: Performed at [facility], [address] [place], [place] [address]  CBC     Status: Abnormal   Collection Time: 07/25/24  4:01 AM  Result Value Ref Range   WBC 15.9 (H) 4.0 - 10.5 K/uL   RBC 2.56 (L) 4.22 - 5.81 MIL/uL   Hemoglobin 8.1 (L) 13.0 - 17.0 g/dL   HCT 75.1 (L) 60.9 - 47.9 %   MCV 96.9 80.0 - 100.0 fL   MCH 31.6 26.0 - 34.0 pg   MCHC 32.7 30.0 - 36.0 g/dL   RDW 83.5 (H) 88.4 - 84.4 %   Platelets 186 150 - 400 K/uL   nRBC 0.2 0.0 - 0.2 %    Comment: Performed at [facility] Lab, [address], [place], [place] [address]  Glucose, capillary     Status: Abnormal   Collection Time: 07/25/24  7:52 AM  Result Value Ref Range   Glucose-Capillary 293 (H) 70 - 99 mg/dL    Comment: Glucose reference range applies only to samples taken after fasting for at least 8 hours.  Glucose, capillary     Status: Abnormal   Collection Time: 07/25/24 11:40 AM  Result Value Ref Range   Glucose-Capillary 264 (H) 70 - 99 mg/dL    Comment: Glucose reference range applies only to samples taken after fasting for at least 8 hours.  Glucose, capillary     Status: Abnormal   Collection Time: 07/25/24  4:00 PM  Result Value Ref Range   Glucose-Capillary 251 (H) 70 - 99 mg/dL    Comment: Glucose reference range applies only to samples taken after fasting for at least 8 hours.  Renal function panel (daily at 1600)     Status: Abnormal   Collection Time: 07/25/24  4:35 PM  Result Value Ref Range   Sodium 134 (L) 135 - 145 mmol/L   Potassium 4.5 3.5 - 5.1 mmol/L   Chloride 100 98 - 111 mmol/L   CO2 22 22 - 32 mmol/L   Glucose, Bld 266 (H) 70 - 99 mg/dL    Comment: Glucose reference range applies only to samples taken after fasting for at least 8 hours.   BUN 51 (H) 8 - 23 mg/dL   Creatinine, Ser 8.18 (H) 0.61 - 1.24 mg/dL   Calcium 9.0 8.9 - 89.6 mg/dL   Phosphorus 2.6 2.5 - 4.6 mg/dL   Albumin 2.1 (L) 3.5 - 5.0 g/dL   GFR, Estimated 38 (L) >60 mL/min    Comment: (NOTE) Calculated using the CKD-EPI Creatinine Equation (2021)    Anion gap 12 5 - 15    Comment: Performed at [facility] Lab, [address] [place], [place] [address]  Hepatic function panel     Status: Abnormal   Collection Time: 07/25/24  4:35 PM  Result Value Ref Range   Total Protein 5.7 (L) 6.5 - 8.1 g/dL   Albumin 2.1 (L) 3.5 - 5.0 g/dL   AST 31 15 - 41 U/L   ALT 128 (H) 0 - 44 U/L   Alkaline Phosphatase 79 38 - 126 U/L   Total Bilirubin 1.0 0.0 - 1.2 mg/dL   Bilirubin, Direct 0.2 0.0 - 0.2 mg/dL   Indirect Bilirubin 0.8 0.3  - 0.9 mg/dL    Comment: Performed at [facility] Lab, [address] [place], [place] [address]  Glucose, capillary     Status: Abnormal   Collection Time: 07/25/24  8:00 PM  Result Value Ref Range   Glucose-Capillary 177 (H) 70 - 99  mg/dL    Comment: Glucose reference range applies only to samples taken after fasting for at least 8 hours.  Glucose, capillary     Status: Abnormal   Collection Time: 07/25/24 11:43 PM  Result Value Ref Range   Glucose-Capillary 135 (H) 70 - 99 mg/dL    Comment: Glucose reference range applies only to samples taken after fasting for at least 8 hours.  Heparin  level (unfractionated)     Status: None   Collection Time: 07/26/24  3:40 AM  Result Value Ref Range   Heparin  Unfractionated 0.48 0.30 - 0.70 IU/mL    Comment: (NOTE) The clinical reportable range upper limit is being lowered to >1.10 to align with the FDA approved guidance for the current laboratory assay.  If heparin  results are below expected values, and patient dosage has  been confirmed, suggest follow up testing of antithrombin III levels. Performed at [facility] Lab, [address] [place], [place] [address]   Glucose, capillary     Status: Abnormal   Collection Time: 07/26/24  3:52 AM  Result Value Ref Range   Glucose-Capillary 159 (H) 70 - 99 mg/dL    Comment: Glucose reference range applies only to samples taken after fasting for at least 8 hours.  Renal function panel (daily at 0500)     Status: Abnormal   Collection Time: 07/26/24  3:54 AM  Result Value Ref Range   Sodium 135 135 - 145 mmol/L   Potassium 4.4 3.5 - 5.1 mmol/L   Chloride 99 98 - 111 mmol/L   CO2 25 22 - 32 mmol/L   Glucose, Bld 156 (H) 70 - 99 mg/dL    Comment: Glucose reference range applies only to samples taken after fasting for at least 8 hours.   BUN 50 (H) 8 - 23 mg/dL   Creatinine, Ser 8.14 (H) 0.61 - 1.24 mg/dL   Calcium 9.2 8.9 - 89.6 mg/dL   Phosphorus 2.7 2.5 - 4.6 mg/dL   Albumin 2.2 (L)  3.5 - 5.0 g/dL   GFR, Estimated 37 (L) >60 mL/min    Comment: (NOTE) Calculated using the CKD-EPI Creatinine Equation (2021)    Anion gap 11 5 - 15    Comment: Performed at [facility] Lab, [address], [place], [place] [address]  Magnesium     Status: None   Collection Time: 07/26/24  3:54 AM  Result Value Ref Range   Magnesium 2.3 1.7 - 2.4 mg/dL    Comment: Performed at [facility] Lab, [address] [place], [place] [address]  CBC     Status: Abnormal   Collection Time: 07/26/24  3:54 AM  Result Value Ref Range   WBC 15.9 (H) 4.0 - 10.5 K/uL   RBC 2.60 (L) 4.22 - 5.81 MIL/uL   Hemoglobin 8.2 (L) 13.0 - 17.0 g/dL   HCT 74.3 (L) 60.9 - 47.9 %   MCV 98.5 80.0 - 100.0 fL   MCH 31.5 26.0 - 34.0 pg   MCHC 32.0 30.0 - 36.0 g/dL   RDW 82.8 (H) 88.4 - 84.4 %   Platelets 197 150 - 400 K/uL   nRBC 0.4 (H) 0.0 - 0.2 %    Comment: Performed at [facility], [address] [place], [place] [address]  Glucose, capillary     Status: Abnormal   Collection Time: 07/26/24  7:26 AM  Result Value Ref Range   Glucose-Capillary 157 (H) 70 - 99 mg/dL    Comment: Glucose  reference range applies only to samples taken after fasting for at least 8 hours.   No results found.   PMH:   Past Medical History:  Diagnosis Date   Arthritis    Diabetes mellitus without complication (HCC)    GERD (gastroesophageal reflux disease)    Hypertension    Sleep apnea     PSH:   Past Surgical History:  Procedure Laterality Date   ANTERIOR CERVICAL DECOMP/DISCECTOMY FUSION N/A 07/17/2024   Procedure: ANTERIOR CERVICAL DECOMPRESSION/DISCECTOMY FUSION, CERVICAL SEVEN - THORACIC ONE;  Surgeon: Debby Dorn MATSU, MD;  Location: [facility] OR;  Service: Neurosurgery;  Laterality: N/A;  Partial Discecomy Cervical seven- Thoracic one   APPENDECTOMY     78 yo   BACK SURGERY  1975   lumbar diskectomy   done [facility]   COLONOSCOPY N/A 02/21/2015   Procedure: COLONOSCOPY;  Surgeon: Claudis RAYMOND Rivet, MD;  Location: [facility] ENDO SUITE;  Service: Endoscopy;  Laterality: N/A;  1025   COLONOSCOPY  11/13/2021   COLONOSCOPY WITH PROPOFOL  N/A 11/13/2021   Procedure: COLONOSCOPY WITH PROPOFOL ;  Surgeon: Rivet Claudis RAYMOND, MD;  Location: [facility] END[facility];  Service: Endoscopy;  Laterality: N/A;  155   CYSTOSCOPY WITH URETHRAL DILATATION N/A 12/09/2022   Procedure: CYSTOSCOPY WITH URETHRAL DILATATION;  Surgeon: Watt Rush, MD;  Location: [facility] ORS;  Service: Urology;  Laterality: N/A;   EYE SURGERY Bilateral    Lasik surgery   IR ANGIOGRAM PULMONARY BILATERAL SELECTIVE  07/18/2024   IR ANGIOGRAM SELECTIVE EACH ADDITIONAL VESSEL  07/18/2024   IR THROMBECT PRIM MECH ADD (INCLU) MOD SED  07/18/2024   IR THROMBECT PRIM MECH ADD (INCLU) MOD SED  07/18/2024   IR THROMBECT PRIM MECH ADD (INCLU) MOD SED  07/18/2024   LEFT HEART CATH AND CORONARY ANGIOGRAPHY N/A 12/23/2020   Procedure: LEFT HEART CATH AND CORONARY ANGIOGRAPHY;  Surgeon: Burnard Debby LABOR, MD;  Location: [facility] INVASIVE CV LAB;  Service: Cardiovascular;  Laterality: N/A;   NECK SURGERY  1999   Work injury, posterior fusion   POLYPECTOMY  11/13/2021   Procedure: POLYPECTOMY INTESTINAL;  Surgeon: Rivet Claudis RAYMOND, MD;  Location: [facility];  Service: Endoscopy;;   SHOULDER SURGERY Bilateral 2000   rotoator cuff repair   done at [facility]   TONSILLECTOMY     TRANSURETHRAL INCISION OF PROSTATE N/A 12/09/2022   Procedure: TRANSURETHRAL RESECTION OF PROSTATE;  Surgeon: Watt Rush, MD;  Location: [facility] ORS;  Service: Urology;  Laterality: N/A;  1 HR FOR CASE    Allergies:  Allergies  Allergen Reactions   Penicillins Hives   Lucentis [Ranibizumab] Rash    Medications:   Prior to Admission medications   Medication Sig Start Date End Date Taking? Authorizing Provider  Apple Cider Vinegar 600 MG CAPS Take 600 mg by mouth daily.   Yes [provider]  aspirin  EC 81 MG tablet Take 1 tablet (81 mg total) by mouth every other day. 11/14/21  Yes  Rehman, Claudis RAYMOND, MD  Barberry-Oreg Grape-Goldenseal (BERBERINE COMPLEX PO) Take 1 capsule by mouth daily.   Yes [provider]  cholecalciferol (VITAMIN D3) 25 MCG (1000 UNIT) tablet Take 1,000 Units by mouth daily.   Yes [provider]  Cinnamon 500 MG capsule Take 500 mg by mouth daily.   Yes [provider]  diphenhydrAMINE HCl (NERVINE PO) Take 1 tablet by mouth daily. Patient states that he takes one a day   Yes [provider]  empagliflozin (JARDIANCE) 10 MG TABS  tablet Take 10 mg by mouth daily.   Yes [provider]  ferrous sulfate 325 (65 FE) MG tablet Take 325 mg by mouth daily with breakfast.   Yes [provider]  hydrochlorothiazide  (HYDRODIURIL ) 25 MG tablet Take 25 mg by mouth daily. 07/23/21  Yes [provider]  insulin  degludec (TRESIBA  FLEXTOUCH) 100 UNIT/ML FlexTouch Pen Inject 12 Units into the skin at bedtime. Patient taking differently: Inject 28 Units into the skin 2 (two) times daily. 10/07/22  Yes Reardon, Benton PARAS, NP  Multiple Vitamins-Minerals (OCUVITE ADULT 50+ PO) Take 1 tablet by mouth daily.   Yes [provider]  OVER THE COUNTER MEDICATION Take 1 capsule by mouth daily. Prostagenix   Yes [provider]  OVER THE COUNTER MEDICATION Take 1 capsule by mouth daily. Diabacore (Diabetic Supplement)   Yes [provider]  OVER THE COUNTER MEDICATION Take 1 tablet by mouth in the morning and at bedtime. Glucoceil   Yes [provider]  tadalafil  (CIALIS ) 5 MG tablet Take 1 tablet (5 mg total) by mouth daily as needed for erectile dysfunction. 12/06/20  Yes Watt Rush, MD  vitamin E 180 MG (400 UNITS) capsule Take 400 Units by mouth daily.   Yes [provider]  Vitamin Mixture (ESTER-C PO) Take 1,000 mg by mouth daily.   Yes [provider]    Discontinued Meds:   Medications Discontinued During This Encounter  Medication Reason   0.9 %  sodium  chloride infusion    lactated ringers  infusion Patient Transfer   insulin  aspart (novoLOG ) injection 0-7 Units Patient Transfer   lidocaine -EPINEPHrine (XYLOCAINE  W/EPI) 1 %-1:100000 (with pres) injection Patient Transfer   0.9 % irrigation (POUR BTL) Patient Transfer   Surgifoam 1 Gm with Thrombin 5,000 units (5 ml) topical solution Patient Transfer   HYDROmorphone  (DILAUDID ) injection 0.25-0.5 mg Patient Transfer   droperidol (INAPSINE) 2.5 MG/ML injection 0.625 mg Patient Transfer   insulin  glargine-yfgn (SEMGLEE) injection 28 Units    midazolam  (VERSED ) 2 MG/2ML injection Returned to ADS   Thrombi-Pad 3X3 pad 1 each    insulin  aspart (novoLOG ) injection 0-6 Units    insulin  glargine-yfgn (SEMGLEE) injection 20 Units    phenylephrine (NEO-SYNEPHRINE) 20mg /NS 250mL premix infusion    heparin  ADULT infusion 100 units/mL (25000 units/250mL)    fentaNYL  (SUBLIMAZE ) injection 25-50 mcg    midazolam  PF (VERSED ) injection 4 mg    norepinephrine (LEVOPHED) 4mg  in (0.016 mg/mL) premix infusion    heparin  ADULT infusion 100 units/mL (25000 units/250mL)    vasopressin (PITRESSIN) 20 Units in 100 mL (0.2 unit/mL) infusion-*FOR SHOCK*    dexmedetomidine (PRECEDEX) 400 MCG/100ML (4 mcg/mL) infusion    midazolam  (VERSED ) 100 mg/100 mL (1 mg/mL) premix infusion    calcium chloride injection 1 g    Oral care mouth rinse    lactated ringers  infusion    fentaNYL  in NS 250mL (34mcg/ml) infusion-PREMIX    fentaNYL  (SUBLIMAZE ) bolus via infusion 25-100 mcg    EPINEPHrine (ADRENALIN) 5 mg in NS 250 mL (0.02 mg/mL) premix infusion    midazolam  (VERSED ) bolus via infusion 2 mg    insulin  regular, human (MYXREDLIN) 100 units/ 100 mL infusion    dextrose  50 % solution 0-50 mL    propofol  (DIPRIVAN ) 1000 MG/100ML infusion    lidocaine  (XYLOCAINE ) 1 % (with pres) injection 20 mL    glycopyrrolate (ROBINUL) injection 0.1 mg    heparin  injection 1,000-6,000 Units    ceFEPIme (MAXIPIME) 2 g in  sodium chloride  0.9 % 100 mL IVPB    ceFEPIme (MAXIPIME) 2 g in sodium chloride  0.9 % 100 mL IVPB    feeding supplement (PROSource TF20) liquid 60 mL    insulin  aspart (novoLOG ) injection 0-9 Units    insulin  aspart (novoLOG ) injection 2 Units    midodrine (PROAMATINE) tablet 5 mg    midodrine (PROAMATINE) tablet 5 mg    pantoprazole  (PROTONIX ) injection 40 mg    polyethylene glycol (MIRALAX / GLYCOLAX) packet 17 g    insulin  aspart (novoLOG ) injection 4 Units    prismasol BGK 4/2.5 infusion    midodrine (PROAMATINE) tablet 5 mg    insulin  glargine-yfgn (SEMGLEE) injection 10 Units    bisacodyl  (DULCOLAX) suppository 10 mg    oxyCODONE  (Oxy IR/ROXICODONE ) immediate release tablet 5 mg    norepinephrine (LEVOPHED) 16 mg in 250mL (0.064 mg/mL) premix infusion    insulin  glargine-yfgn (SEMGLEE) injection 20 Units    insulin  aspart (novoLOG ) injection 6 Units    midodrine (PROAMATINE) tablet 10 mg    insulin  aspart (novoLOG ) injection 0-15 Units    feeding supplement (VITAL 1.5 CAL) liquid 1,000 mL    midodrine (PROAMATINE) tablet 5 mg    insulin  glargine-yfgn (SEMGLEE) injection 30 Units     Social History:  reports that he has never smoked. He has never used smokeless tobacco. He reports that he does not drink alcohol and does not use drugs.  Family History:  History reviewed. No pertinent family history.  Blood pressure (!) 131/46, pulse 77, temperature 97.8 F (36.6 C), temperature source Axillary, resp. rate 18, height 6' 1 (1.854 m), weight 73.8 kg, SpO2 100%. Physical Exam: General appearance: alert, cooperative, and appears stated age Head: NCAT Neck: no adenopathy, brace Resp: CTA b/l Cardio: regular rate and rhythm GI: SNDNT Extremities: tr edema Pulses: 2+ and symmetric     Candida Vetter, LYNWOOD ORN, MD 07/26/2024, 9:38 AM

## 2024-07-26 NOTE — Progress Notes (Signed)
 SLP Cancellation Note  Patient Details Name: Lawrence [name] MRN: [id number] DOB: 1946-05-27   Cancelled treatment:       Reason Eval/Treat Not Completed: Other (comment) Patient and family having discussion with CIR MD (Dr. Cornelio). SLP will continue to follow.  Norleen IVAR Blase, MA, CCC-SLP Speech Therapy  07/26/2024, 4:06 PM

## 2024-07-27 DIAGNOSIS — E871 Hypo-osmolality and hyponatremia: Secondary | ICD-10-CM

## 2024-07-27 DIAGNOSIS — E875 Hyperkalemia: Secondary | ICD-10-CM

## 2024-07-27 DIAGNOSIS — I2602 Saddle embolus of pulmonary artery with acute cor pulmonale: Secondary | ICD-10-CM

## 2024-07-27 DIAGNOSIS — G8220 Paraplegia, unspecified: Secondary | ICD-10-CM | POA: Diagnosis not present

## 2024-07-27 DIAGNOSIS — I469 Cardiac arrest, cause unspecified: Secondary | ICD-10-CM | POA: Diagnosis not present

## 2024-07-27 DIAGNOSIS — S14107D Unspecified injury at C7 level of cervical spinal cord, subsequent encounter: Secondary | ICD-10-CM | POA: Diagnosis not present

## 2024-07-27 LAB — RENAL FUNCTION PANEL
Albumin: 2.3 g/dL — ABNORMAL LOW (ref 3.5–5.0)
Albumin: 2.5 g/dL — ABNORMAL LOW (ref 3.5–5.0)
Anion gap: 15 (ref 5–15)
Anion gap: 12 (ref 5–15)
BUN: 105 mg/dL — ABNORMAL HIGH (ref 8–23)
BUN: 42 mg/dL — ABNORMAL HIGH (ref 8–23)
CO2: 22 mmol/L (ref 22–32)
CO2: 25 mmol/L (ref 22–32)
Calcium: 8.9 mg/dL (ref 8.9–10.3)
Calcium: 8.1 mg/dL — ABNORMAL LOW (ref 8.9–10.3)
Chloride: 97 mmol/L — ABNORMAL LOW (ref 98–111)
Chloride: 98 mmol/L (ref 98–111)
Creatinine, Ser: 3.95 mg/dL — ABNORMAL HIGH (ref 0.61–1.24)
Creatinine, Ser: 1.77 mg/dL — ABNORMAL HIGH (ref 0.61–1.24)
GFR, Estimated: 15 mL/min — ABNORMAL LOW (ref >60)
GFR, Estimated: 39 mL/min — ABNORMAL LOW (ref >60)
Glucose, Bld: 258 mg/dL — ABNORMAL HIGH (ref 70–99)
Glucose, Bld: 110 mg/dL — ABNORMAL HIGH (ref 70–99)
Phosphorus: 3.6 mg/dL (ref 2.5–4.6)
Phosphorus: 1.6 mg/dL — ABNORMAL LOW (ref 2.5–4.6)
Potassium: 5.6 mmol/L — ABNORMAL HIGH (ref 3.5–5.1)
Potassium: 3.3 mmol/L — ABNORMAL LOW (ref 3.5–5.1)
Sodium: 134 mmol/L — ABNORMAL LOW (ref 135–145)
Sodium: 135 mmol/L (ref 135–145)

## 2024-07-27 LAB — GLUCOSE, CAPILLARY
Glucose-Capillary: 209 mg/dL — ABNORMAL HIGH (ref 70–99)
Glucose-Capillary: 181 mg/dL — ABNORMAL HIGH (ref 70–99)
Glucose-Capillary: 157 mg/dL — ABNORMAL HIGH (ref 70–99)
Glucose-Capillary: 174 mg/dL — ABNORMAL HIGH (ref 70–99)
Glucose-Capillary: 125 mg/dL — ABNORMAL HIGH (ref 70–99)

## 2024-07-27 LAB — CBC
HCT: 24.6 % — ABNORMAL LOW (ref 39.0–52.0)
Hemoglobin: 7.7 g/dL — ABNORMAL LOW (ref 13.0–17.0)
MCH: 31.4 pg (ref 26.0–34.0)
MCHC: 31.3 g/dL (ref 30.0–36.0)
MCV: 100.4 fL — ABNORMAL HIGH (ref 80.0–100.0)
Platelets: 214 K/uL (ref 150–400)
RBC: 2.45 MIL/uL — ABNORMAL LOW (ref 4.22–5.81)
RDW: 17.7 % — ABNORMAL HIGH (ref 11.5–15.5)
WBC: 16 K/uL — ABNORMAL HIGH (ref 4.0–10.5)
nRBC: 0.6 % — ABNORMAL HIGH (ref 0.0–0.2)

## 2024-07-27 LAB — HEPATITIS B SURFACE ANTIGEN: Hepatitis B Surface Ag: NONREACTIVE

## 2024-07-27 LAB — MAGNESIUM: Magnesium: 2.6 mg/dL — ABNORMAL HIGH (ref 1.7–2.4)

## 2024-07-27 LAB — HEPARIN LEVEL (UNFRACTIONATED): Heparin Unfractionated: 0.49 [IU]/mL (ref 0.30–0.70)

## 2024-07-27 MED ORDER — CHLORHEXIDINE GLUCONATE CLOTH 2 % EX PADS
6.0000 | MEDICATED_PAD | Freq: Every day | CUTANEOUS | Status: DC
  Administered 2024-07-29: 6 via TOPICAL

## 2024-07-27 MED ORDER — INSULIN GLARGINE-YFGN 100 UNIT/ML ~~LOC~~ SOLN
30.0000 [IU] | Freq: Two times a day (BID) | SUBCUTANEOUS | Status: DC
  Filled 2024-07-27 (×2): qty 0.3

## 2024-07-27 MED ORDER — LIDOCAINE HCL (PF) 1 % IJ SOLN
5.0000 mL | INTRAMUSCULAR | Status: DC | PRN
Start: 2024-07-27 — End: 2024-07-29

## 2024-07-27 MED ORDER — HEPARIN SODIUM (PORCINE) 1000 UNIT/ML DIALYSIS
1000.0000 [IU] | INTRAMUSCULAR | Status: DC | PRN
  Administered 2024-07-27: 1000 [IU]
  Administered 2024-07-29: 2800 [IU]
  Filled 2024-07-27 (×2): qty 1

## 2024-07-27 MED ORDER — ALTEPLASE 2 MG IJ SOLR: 2.0000 mg | Freq: Once | INTRAMUSCULAR | Status: DC | PRN

## 2024-07-27 MED ORDER — PENTAFLUOROPROP-TETRAFLUOROETH EX AERO: 1.0000 | INHALATION_SPRAY | CUTANEOUS | Status: DC | PRN

## 2024-07-27 MED ORDER — HEPARIN SODIUM (PORCINE) 1000 UNIT/ML IJ SOLN
INTRAMUSCULAR | Status: AC
  Filled 2024-07-27: qty 4

## 2024-07-27 MED ORDER — ACETAMINOPHEN 325 MG PO TABS
650.0000 mg | ORAL_TABLET | Freq: Four times a day (QID) | ORAL | Status: DC | PRN
  Administered 2024-07-27 – 2024-08-02 (×6): 650 mg
  Filled 2024-07-27 (×6): qty 2

## 2024-07-27 MED ORDER — LIDOCAINE-PRILOCAINE 2.5-2.5 % EX CREA: 1.0000 | TOPICAL_CREAM | CUTANEOUS | Status: DC | PRN

## 2024-07-27 MED ORDER — INSULIN GLARGINE-YFGN 100 UNIT/ML ~~LOC~~ SOLN
50.0000 [IU] | Freq: Two times a day (BID) | SUBCUTANEOUS | Status: DC
  Administered 2024-07-27 – 2024-07-30 (×8): 50 [IU] via SUBCUTANEOUS
  Filled 2024-07-27 (×11): qty 0.5

## 2024-07-27 MED ORDER — ANTICOAGULANT SODIUM CITRATE 4% (200MG/5ML) IV SOLN: 5.0000 mL | Status: DC | PRN

## 2024-07-27 MED ORDER — ACETAMINOPHEN 500 MG PO TABS
1000.0000 mg | ORAL_TABLET | Freq: Four times a day (QID) | ORAL | Status: DC | PRN
  Administered 2024-07-27: 1000 mg
  Filled 2024-07-27: qty 2

## 2024-07-27 MED ORDER — OXYCODONE HCL 5 MG PO TABS
2.5000 mg | ORAL_TABLET | Freq: Four times a day (QID) | ORAL | Status: DC | PRN
Start: 2024-07-27 — End: 2024-08-02
  Administered 2024-07-27 – 2024-07-28 (×2): 2.5 mg
  Filled 2024-07-27 (×2): qty 1

## 2024-07-27 NOTE — Progress Notes (Signed)
 E-link notified that patient's MAP on arterial line is holding below 65. Cuff MAP is above 65.

## 2024-07-27 NOTE — TOC Progression Note (Signed)
 Transition of Care [facility]) - Progression Note    Patient Details  Name: Lawrence [name] MRN: [id number] Date of Birth: 11/24/1945  Transition [facility] CM/SW Contact  Inocente GORMAN Kindle, LCSW Phone Number: 07/27/2024, 1:55 PM  Clinical Narrative:    Inpatient Care Management continuing to follow for needs with CIR.    Expected Discharge Plan: IP Rehab Facility Barriers to Discharge: Continued Medical Work up               Expected Discharge Plan and Services   Discharge Planning Services: CM Consult Post Acute Care Choice: IP Rehab Living arrangements for the past 2 months: Single Family Home                                       Social Drivers of Health (SDOH) Interventions SDOH Screenings   Food Insecurity: No Food Insecurity (07/17/2024)  Housing: Low Risk  (07/17/2024)  Transportation Needs: No Transportation Needs (07/17/2024)  Utilities: Not At Risk (07/17/2024)  Social Connections: Moderately Isolated (07/17/2024)  Tobacco Use: Low Risk  (07/17/2024)    Readmission Risk Interventions     No data to display

## 2024-07-27 NOTE — Progress Notes (Signed)
 Lawrence [name] is an 78 y.o. male diabetes, hypertension, BPH status post TURP initially presenting after a ground-level fall about 5 days prior to admission. Of note over the past few weeks he has had neuropathy in his feet, felt more unsteady and used a cane because of his neuropathy.  Unfortunately he slipped and fell and hit his head on the sink followed by new onset numbness in his legs, inability to sit up and loss of function in his legs as well as urinary incontinence.  He was to have a partial anterior cervical discectomy C7-T1 to stabilize even though he was not likely to regain strength in his lower extremities but during the operative course he developed PEA arrest x 2 with subsequent CT scan showing a massive bilateral PE with poor lung perfusion.  Patient also had successful thrombectomy performed by VIR removing a large clot burden at the right PA bifurcation as well as a large clot burden in the left lower lung lobe. BL cr is approximately 0.9-1.1 (12/02/22) but with his complicated course he has developed renal failure with very poor urine output.  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 - He does not want long-term dialysis but is willing to give a trial with short-term dialysis to give his kidneys a chance to recover  -Initially anuric with K trending up --> CRRT on 10/29-11/4 -> still oliguric.  Likely will be a prolonged course given significant renal injury, hopefully no cortical necrosis.   No e/o recovery; plan on iHD today. Eventually will need TC if no e/o recovery in short term, warned him it can take over a mth to recover given substantial renal insults. Daughter bedside updated as well.  -Monitor Daily I/Os, Daily weight  -Maintain MAP>65 for optimal renal perfusion.  - Avoid nephrotoxic agents such as IV contrast, NSAIDs, and phosphate containing  bowel preps (FLEETS)   PE s/p at least 2 PEA arrests with ROSC was not a candidate for lytics because of surgery status post successful thrombectomy by VIR C7 fracture with paraplegia affecting the lower extremities which may not be reversible. Acute respiratory failure -off sedation and alert, and possible extubation trial pending weaning parameters Cardiogenic shock secondary to pulmonary embolus -off epinephrine, still on Levophed by cardiology Anemia: transfuse if Hb <7  Subjective: Denies fever, chills, nausea, SOB; daughter updated.   Chemistry and CBC: Creatinine  Date/Time Value Ref Range Status  02/21/2021 12:00 AM 1.2 0.6 - 1.3 Final  10/27/2020 12:00 AM 0.9 0.6 - 1.3 Final   Creatinine, Ser  Date/Time Value Ref Range Status  07/27/2024 05:25 AM 3.95 (H) 0.61 - 1.24 mg/dL Final    Comment:    DELTA CHECK NOTED  07/26/2024 04:26 PM 2.56 (H) 0.61 - 1.24 mg/dL Final  88/95/7974 96:45 AM 1.85 (H) 0.61 - 1.24 mg/dL Final  88/96/7974 95:64 PM 1.81 (H) 0.61 - 1.24 mg/dL Final  88/96/7974 95:98 AM 1.86 (H) 0.61 - 1.24 mg/dL Final  88/97/7974 96:49 PM 1.91 (H) 0.61 - 1.24 mg/dL Final  88/97/7974 95:54 AM 2.13 (H) 0.61 - 1.24 mg/dL Final  88/98/7974 95:99 PM 2.33 (H) 0.61 - 1.24 mg/dL Final  88/98/7974 96:69 AM 2.23 (H) 0.61 - 1.24 mg/dL Final  89/68/7974 95:94 PM 2.36 (H) 0.61 - 1.24 mg/dL Final  89/68/7974 96:65 AM 2.57 (H) 0.61 - 1.24 mg/dL Final  89/69/7974 95:49 PM 3.03 (H) 0.61 -  1.24 mg/dL Final  89/69/7974 95:83 AM 4.16 (H) 0.61 - 1.24 mg/dL Final  89/70/7974 95:60 PM 5.73 (H) 0.61 - 1.24 mg/dL Final  89/70/7974 94:86 AM 6.10 (H) 0.61 - 1.24 mg/dL Final  89/71/7974 90:99 PM 5.41 (H) 0.61 - 1.24 mg/dL Final  89/71/7974 97:46 PM 5.16 (H) 0.61 - 1.24 mg/dL Final  89/71/7974 98:44 AM 4.18 (H) 0.61 - 1.24 mg/dL Final  89/72/7974 95:83 PM 3.69 (H) 0.61 - 1.24 mg/dL Final  89/72/7974 95:82 AM 2.90 (H) 0.61 - 1.24 mg/dL Final  89/73/7974 90:59 PM 2.37 (H) 0.61 - 1.24 mg/dL  Final  89/73/7974 97:48 PM 1.50 (H) 0.61 - 1.24 mg/dL Final  89/73/7974 95:74 AM 0.99 0.61 - 1.24 mg/dL Final  89/74/7974 92:40 PM 1.08 0.61 - 1.24 mg/dL Final  96/87/7975 89:41 AM 1.02 0.61 - 1.24 mg/dL Final  97/79/7976 98:95 PM 0.92 0.61 - 1.24 mg/dL Final  95/96/7977 93:65 PM 0.86 0.61 - 1.24 mg/dL Final   Recent Labs  Lab 07/24/24 0445 07/24/24 1550 07/25/24 0401 07/25/24 1635 07/26/24 0354 07/26/24 1626 07/27/24 0525  NA 134* 135 134* 134* 135 135 134*  K 4.9 4.7 4.6 4.5 4.4 5.0 5.6*  CL 100 100 98 100 99 101 97*  CO2 23 24 23 22 25 22 22   GLUCOSE 243* 235* 256* 266* 156* 189* 258*  BUN 49* 48* 47* 51* 50* 72* 105*  CREATININE 2.13* 1.91* 1.86* 1.81* 1.85* 2.56* 3.95*  CALCIUM 8.5* 8.8* 8.8* 9.0 9.2 8.9 8.9  PHOS 3.1  3.0 3.1 3.1 2.6 2.7 3.6 3.6   Recent Labs  Lab 07/24/24 0732 07/25/24 0401 07/26/24 0354 07/27/24 0525  WBC 16.4* 15.9* 15.9* 16.0*  HGB 7.8* 8.1* 8.2* 7.7*  HCT 23.8* 24.8* 25.6* 24.6*  MCV 96.0 96.9 98.5 100.4*  PLT 148* 186 197 214   Liver Function Tests: Recent Labs  Lab 07/25/24 1635 07/26/24 0354 07/26/24 1626 07/27/24 0525  AST 31  --   --   --   ALT 128*  --   --   --   ALKPHOS 79  --   --   --   BILITOT 1.0  --   --   --   PROT 5.7*  --   --   --   ALBUMIN 2.1*  2.1* 2.2* 2.1* 2.3*   No results for input(s): LIPASE, AMYLASE in the last 168 hours.  No results for input(s): AMMONIA in the last 168 hours. Cardiac Enzymes: No results for input(s): CKTOTAL, CKMB, CKMBINDEX, TROPONINI in the last 168 hours.  Iron Studies: No results for input(s): IRON, TIBC, TRANSFERRIN, FERRITIN in the last 72 hours. PT/INR: @LABRCNTIP (inr:5)  Xrays/Other Studies: ) Results for orders placed or performed during the hospital encounter of 07/16/24 (from the past 48 hours)  Glucose, capillary     Status: Abnormal   Collection Time: 07/25/24  4:00 PM  Result Value Ref Range   Glucose-Capillary 251 (H) 70 - 99 mg/dL     Comment: Glucose reference range applies only to samples taken after fasting for at least 8 hours.  Renal function panel (daily at 1600)     Status: Abnormal   Collection Time: 07/25/24  4:35 PM  Result Value Ref Range   Sodium 134 (L) 135 - 145 mmol/L   Potassium 4.5 3.5 - 5.1 mmol/L   Chloride 100 98 - 111 mmol/L   CO2 22 22 - 32 mmol/L   Glucose, Bld 266 (H) 70 - 99 mg/dL    Comment: Glucose reference  range applies only to samples taken after fasting for at least 8 hours.   BUN 51 (H) 8 - 23 mg/dL   Creatinine, Ser 8.18 (H) 0.61 - 1.24 mg/dL   Calcium 9.0 8.9 - 89.6 mg/dL   Phosphorus 2.6 2.5 - 4.6 mg/dL   Albumin 2.1 (L) 3.5 - 5.0 g/dL   GFR, Estimated 38 (L) >60 mL/min    Comment: (NOTE) Calculated using the CKD-EPI Creatinine Equation (2021)    Anion gap 12 5 - 15    Comment: Performed at [facility], [address], [place], [place] [address]  Hepatic function panel     Status: Abnormal   Collection Time: 07/25/24  4:35 PM  Result Value Ref Range   Total Protein 5.7 (L) 6.5 - 8.1 g/dL   Albumin 2.1 (L) 3.5 - 5.0 g/dL   AST 31 15 - 41 U/L   ALT 128 (H) 0 - 44 U/L   Alkaline Phosphatase 79 38 - 126 U/L   Total Bilirubin 1.0 0.0 - 1.2 mg/dL   Bilirubin, Direct 0.2 0.0 - 0.2 mg/dL   Indirect Bilirubin 0.8 0.3 - 0.9 mg/dL    Comment: Performed at [facility], [address] [place], [place] [address]  Glucose, capillary     Status: Abnormal   Collection Time: 07/25/24  8:00 PM  Result Value Ref Range   Glucose-Capillary 177 (H) 70 - 99 mg/dL    Comment: Glucose reference range applies only to samples taken after fasting for at least 8 hours.  Glucose, capillary     Status: Abnormal   Collection Time: 07/25/24 11:43 PM  Result Value Ref Range   Glucose-Capillary 135 (H) 70 - 99 mg/dL    Comment: Glucose reference range applies only to samples taken after fasting for at least 8 hours.  Heparin  level (unfractionated)     Status: None   Collection Time:  07/26/24  3:40 AM  Result Value Ref Range   Heparin  Unfractionated 0.48 0.30 - 0.70 IU/mL    Comment: (NOTE) The clinical reportable range upper limit is being lowered to >1.10 to align with the FDA approved guidance for the current laboratory assay.  If heparin  results are below expected values, and patient dosage has  been confirmed, suggest follow up testing of antithrombin III levels. Performed at [facility], [address], [place], [place] [address]   Glucose, capillary     Status: Abnormal   Collection Time: 07/26/24  3:52 AM  Result Value Ref Range   Glucose-Capillary 159 (H) 70 - 99 mg/dL    Comment: Glucose reference range applies only to samples taken after fasting for at least 8 hours.  Renal function panel (daily at 0500)     Status: Abnormal   Collection Time: 07/26/24  3:54 AM  Result Value Ref Range   Sodium 135 135 - 145 mmol/L   Potassium 4.4 3.5 - 5.1 mmol/L   Chloride 99 98 - 111 mmol/L   CO2 25 22 - 32 mmol/L   Glucose, Bld 156 (H) 70 - 99 mg/dL    Comment: Glucose reference range applies only to samples taken after fasting for at least 8 hours.   BUN 50 (H) 8 - 23 mg/dL   Creatinine, Ser 8.14 (H) 0.61 - 1.24 mg/dL   Calcium 9.2 8.9 - 89.6 mg/dL   Phosphorus 2.7 2.5 - 4.6 mg/dL   Albumin 2.2 (L) 3.5 - 5.0 g/dL   GFR, Estimated 37 (L) >60  mL/min    Comment: (NOTE) Calculated using the CKD-EPI Creatinine Equation (2021)    Anion gap 11 5 - 15    Comment: Performed at [facility], [address] [place], [place] [address]  Magnesium     Status: None   Collection Time: 07/26/24  3:54 AM  Result Value Ref Range   Magnesium 2.3 1.7 - 2.4 mg/dL    Comment: Performed at [facility], [address], [place], [place] [address]  CBC     Status: Abnormal   Collection Time: 07/26/24  3:54 AM  Result Value Ref Range   WBC 15.9 (H) 4.0 - 10.5 K/uL   RBC 2.60 (L) 4.22 - 5.81 MIL/uL   Hemoglobin 8.2 (L) 13.0 - 17.0 g/dL   HCT 74.3 (L) 60.9 -  52.0 %   MCV 98.5 80.0 - 100.0 fL   MCH 31.5 26.0 - 34.0 pg   MCHC 32.0 30.0 - 36.0 g/dL   RDW 82.8 (H) 88.4 - 84.4 %   Platelets 197 150 - 400 K/uL   nRBC 0.4 (H) 0.0 - 0.2 %    Comment: Performed at [facility], [address], [place], [place] [address]  Glucose, capillary     Status: Abnormal   Collection Time: 07/26/24  7:26 AM  Result Value Ref Range   Glucose-Capillary 157 (H) 70 - 99 mg/dL    Comment: Glucose reference range applies only to samples taken after fasting for at least 8 hours.  Glucose, capillary     Status: Abnormal   Collection Time: 07/26/24 11:46 AM  Result Value Ref Range   Glucose-Capillary 140 (H) 70 - 99 mg/dL    Comment: Glucose reference range applies only to samples taken after fasting for at least 8 hours.  Glucose, capillary     Status: Abnormal   Collection Time: 07/26/24  3:32 PM  Result Value Ref Range   Glucose-Capillary 154 (H) 70 - 99 mg/dL    Comment: Glucose reference range applies only to samples taken after fasting for at least 8 hours.  Renal function panel (daily at 1600)     Status: Abnormal   Collection Time: 07/26/24  4:26 PM  Result Value Ref Range   Sodium 135 135 - 145 mmol/L   Potassium 5.0 3.5 - 5.1 mmol/L   Chloride 101 98 - 111 mmol/L   CO2 22 22 - 32 mmol/L   Glucose, Bld 189 (H) 70 - 99 mg/dL    Comment: Glucose reference range applies only to samples taken after fasting for at least 8 hours.   BUN 72 (H) 8 - 23 mg/dL   Creatinine, Ser 7.43 (H) 0.61 - 1.24 mg/dL   Calcium 8.9 8.9 - 89.6 mg/dL   Phosphorus 3.6 2.5 - 4.6 mg/dL   Albumin 2.1 (L) 3.5 - 5.0 g/dL   GFR, Estimated 25 (L) >60 mL/min    Comment: (NOTE) Calculated using the CKD-EPI Creatinine Equation (2021)    Anion gap 12 5 - 15    Comment: Performed at [facility], [address], [place], [place] [address]  Glucose, capillary     Status: Abnormal   Collection Time: 07/26/24  8:03 PM  Result Value Ref Range   Glucose-Capillary 178 (H) 70 -  99 mg/dL    Comment: Glucose reference range applies only to samples taken after fasting for at least 8 hours.  Glucose, capillary     Status: Abnormal   Collection Time:  07/26/24 11:47 PM  Result Value Ref Range   Glucose-Capillary 203 (H) 70 - 99 mg/dL    Comment: Glucose reference range applies only to samples taken after fasting for at least 8 hours.  Glucose, capillary     Status: Abnormal   Collection Time: 07/27/24  4:09 AM  Result Value Ref Range   Glucose-Capillary 209 (H) 70 - 99 mg/dL    Comment: Glucose reference range applies only to samples taken after fasting for at least 8 hours.  Heparin  level (unfractionated)     Status: None   Collection Time: 07/27/24  5:25 AM  Result Value Ref Range   Heparin  Unfractionated 0.49 0.30 - 0.70 IU/mL    Comment: (NOTE) The clinical reportable range upper limit is being lowered to >1.10 to align with the FDA approved guidance for the current laboratory assay.  If heparin  results are below expected values, and patient dosage has  been confirmed, suggest follow up testing of antithrombin III levels. Performed at [facility], [address] [place], [place] [address]   Renal function panel (daily at 0500)     Status: Abnormal   Collection Time: 07/27/24  5:25 AM  Result Value Ref Range   Sodium 134 (L) 135 - 145 mmol/L   Potassium 5.6 (H) 3.5 - 5.1 mmol/L   Chloride 97 (L) 98 - 111 mmol/L   CO2 22 22 - 32 mmol/L   Glucose, Bld 258 (H) 70 - 99 mg/dL    Comment: Glucose reference range applies only to samples taken after fasting for at least 8 hours.   BUN 105 (H) 8 - 23 mg/dL   Creatinine, Ser 6.04 (H) 0.61 - 1.24 mg/dL    Comment: DELTA CHECK NOTED   Calcium 8.9 8.9 - 10.3 mg/dL   Phosphorus 3.6 2.5 - 4.6 mg/dL   Albumin 2.3 (L) 3.5 - 5.0 g/dL   GFR, Estimated 15 (L) >60 mL/min    Comment: (NOTE) Calculated using the CKD-EPI Creatinine Equation (2021)    Anion gap 15 5 - 15    Comment: Performed at [facility], [address] [place], [place] [address]  Magnesium     Status: Abnormal   Collection Time: 07/27/24  5:25 AM  Result Value Ref Range   Magnesium 2.6 (H) 1.7 - 2.4 mg/dL    Comment: Performed at [facility], [address], [place], [place] [address]  CBC     Status: Abnormal   Collection Time: 07/27/24  5:25 AM  Result Value Ref Range   WBC 16.0 (H) 4.0 - 10.5 K/uL   RBC 2.45 (L) 4.22 - 5.81 MIL/uL   Hemoglobin 7.7 (L) 13.0 - 17.0 g/dL   HCT 75.3 (L) 60.9 - 47.9 %   MCV 100.4 (H) 80.0 - 100.0 fL   MCH 31.4 26.0 - 34.0 pg   MCHC 31.3 30.0 - 36.0 g/dL   RDW 82.2 (H) 88.4 - 84.4 %   Platelets 214 150 - 400 K/uL   nRBC 0.6 (H) 0.0 - 0.2 %    Comment: Performed at [facility], [address] [place], [place] [address]  Glucose, capillary     Status: Abnormal   Collection Time: 07/27/24  7:41 AM  Result Value Ref Range   Glucose-Capillary 181 (H) 70 - 99 mg/dL    Comment: Glucose reference range applies only to samples taken after fasting for at least 8 hours.   Comment 1 Notify RN  Comment 2 Document in Chart   Glucose, capillary     Status: Abnormal   Collection Time: 07/27/24 11:30 AM  Result Value Ref Range   Glucose-Capillary 157 (H) 70 - 99 mg/dL    Comment: Glucose reference range applies only to samples taken after fasting for at least 8 hours.   Comment 1 Notify RN    Comment 2 Document in Chart    CT CERVICAL SPINE WO CONTRAST Addendum Date: 07/26/2024 ADDENDUM REPORT: 07/26/2024 17:11 ADDENDUM: These results were called by telephone at the time of interpretation on 07/26/2024 at 3:27 p.m. to provider Leita Gleason, PA, who verbally acknowledged these results. Electronically Signed   By: Toribio Agreste M.D.   On: 07/26/2024 17:11   Result Date: 07/26/2024 CLINICAL DATA:  Cervical trauma. Known left C7 transverse process fracture. EXAM: CT CERVICAL SPINE WITHOUT CONTRAST TECHNIQUE: Multidetector CT imaging of the cervical spine was performed without  intravenous contrast. Multiplanar CT image reconstructions were also generated. RADIATION DOSE REDUCTION: This exam was performed according to the departmental dose-optimization program which includes automated exposure control, adjustment of the mA and/or kV according to patient size and/or use of iterative reconstruction technique. COMPARISON:  None Available. FINDINGS: Alignment: Normal. Skull base and vertebrae: Vertebral body heights are normal. There is moderate spondylosis throughout the cervical spine to include uncovertebral joint spurring and facet arthropathy. Evidence of patient's known displaced left C7 transverse process fracture. Right-sided neural foraminal narrowing at the C3-4 level and C4-5 levels. Minimal left-sided neural from narrowing at the C6-7 level. Soft tissues and spinal canal: Prevertebral soft tissues are normal. Mild narrowing of the AP diameter of the spinal canal the C5-6 level. Disc levels: Minimal disc space narrowing at multiple levels from the C4-5 level to the C6-7 level. Upper chest: Partially visualized right IJ central venous catheter as well as enteric tube. No acute findings over the visualized upper chest and lungs. Other: There is a new 3.5 x 5.8 cm slightly hyperdense collection over the left anterior neck at the level of the thyroid gland suggesting a contained hematoma. IMPRESSION: 1. Evidence of patient's known displaced left C7 transverse process fracture. 2. Moderate spondylosis throughout the cervical spine with minimal disc disease at multiple levels. Mild narrowing of the AP diameter of the spinal canal at the C5-6 level. 3. New 3.5 x 5.8 cm slightly hyperdense collection over the left anterior neck at the level of the thyroid gland suggesting a contained hematoma. Currently paging provider. Electronically Signed: By: Toribio Agreste M.D. On: 07/26/2024 14:57     PMH:   Past Medical History:  Diagnosis Date   Arthritis    Diabetes mellitus without  complication (HCC)    GERD (gastroesophageal reflux disease)    Hypertension    Sleep apnea     PSH:   Past Surgical History:  Procedure Laterality Date   ANTERIOR CERVICAL DECOMP/DISCECTOMY FUSION N/A 07/17/2024   Procedure: ANTERIOR CERVICAL DECOMPRESSION/DISCECTOMY FUSION, CERVICAL SEVEN - THORACIC ONE;  Surgeon: Debby Dorn MATSU, MD;  Location: [facility] OR;  Service: Neurosurgery;  Laterality: N/A;  Partial Discecomy Cervical seven- Thoracic one   APPENDECTOMY     78 yo   BACK SURGERY  1975   lumbar diskectomy   done [facility]   COLONOSCOPY N/A 02/21/2015   Procedure: COLONOSCOPY;  Surgeon: Claudis RAYMOND Rivet, MD;  Location: [facility] ENDO SUITE;  Service: Endoscopy;  Laterality: N/A;  1025   COLONOSCOPY  11/13/2021   COLONOSCOPY WITH PROPOFOL  N/A 11/13/2021   Procedure:  COLONOSCOPY WITH PROPOFOL ;  Surgeon: Golda Claudis PENNER, MD;  Location: [facility] END[facility] SUITE;  Service: Endoscopy;  Laterality: N/A;  155   CYSTOSCOPY WITH URETHRAL DILATATION N/A 12/09/2022   Procedure: CYSTOSCOPY WITH URETHRAL DILATATION;  Surgeon: Watt Rush, MD;  Location: [facility] ORS;  Service: Urology;  Laterality: N/A;   EYE SURGERY Bilateral    Lasik surgery   IR ANGIOGRAM PULMONARY BILATERAL SELECTIVE  07/18/2024   IR ANGIOGRAM SELECTIVE EACH ADDITIONAL VESSEL  07/18/2024   IR THROMBECT PRIM MECH ADD (INCLU) MOD SED  07/18/2024   IR THROMBECT PRIM MECH ADD (INCLU) MOD SED  07/18/2024   IR THROMBECT PRIM MECH ADD (INCLU) MOD SED  07/18/2024   LEFT HEART CATH AND CORONARY ANGIOGRAPHY N/A 12/23/2020   Procedure: LEFT HEART CATH AND CORONARY ANGIOGRAPHY;  Surgeon: Burnard Debby LABOR, MD;  Location: [facility] INVASIVE CV LAB;  Service: Cardiovascular;  Laterality: N/A;   NECK SURGERY  1999   Work injury, posterior fusion   POLYPECTOMY  11/13/2021   Procedure: POLYPECTOMY INTESTINAL;  Surgeon: Golda Claudis PENNER, MD;  Location: [facility] END[facility];  Service: Endoscopy;;   SHOULDER SURGERY Bilateral 2000   rotoator cuff repair   done at [facility]   TONSILLECTOMY     TRANSURETHRAL INCISION OF PROSTATE N/A 12/09/2022   Procedure: TRANSURETHRAL RESECTION OF PROSTATE;  Surgeon: Watt Rush, MD;  Location: [facility] ORS;  Service: Urology;  Laterality: N/A;  1 HR FOR CASE    Allergies:  Allergies  Allergen Reactions   Penicillins Hives   Lucentis [Ranibizumab] Rash    Medications:   Prior to Admission medications   Medication Sig Start Date End Date Taking? Authorizing Provider  Apple Cider Vinegar 600 MG CAPS Take 600 mg by mouth daily.   Yes [provider]  aspirin  EC 81 MG tablet Take 1 tablet (81 mg total) by mouth every other day. 11/14/21  Yes Rehman, Claudis PENNER, MD  Barberry-Oreg Grape-Goldenseal (BERBERINE COMPLEX PO) Take 1 capsule by mouth daily.   Yes [provider]  cholecalciferol (VITAMIN D3) 25 MCG (1000 UNIT) tablet Take 1,000 Units by mouth daily.   Yes [provider]  Cinnamon 500 MG capsule Take 500 mg by mouth daily.   Yes [provider]  diphenhydrAMINE HCl (NERVINE PO) Take 1 tablet by mouth daily. Patient states that he takes one a day   Yes [provider]  empagliflozin (JARDIANCE) 10 MG TABS tablet Take 10 mg by mouth daily.   Yes [provider]  ferrous sulfate 325 (65 FE) MG tablet Take 325 mg by mouth daily with breakfast.   Yes [provider]  hydrochlorothiazide  (HYDRODIURIL ) 25 MG tablet Take 25 mg by mouth daily. 07/23/21  Yes [provider]  insulin  degludec (TRESIBA  FLEXTOUCH) 100 UNIT/ML FlexTouch Pen Inject 12 Units into the skin at bedtime. Patient taking differently: Inject 28 Units into the skin 2 (two) times daily. 10/07/22  Yes Reardon, Benton PARAS, NP  Multiple Vitamins-Minerals (OCUVITE ADULT 50+ PO) Take 1 tablet by mouth daily.   Yes [provider]  OVER THE COUNTER MEDICATION Take 1 capsule by mouth daily. Prostagenix   Yes [provider]  OVER THE COUNTER MEDICATION Take 1 capsule by mouth daily.  Diabacore (Diabetic Supplement)   Yes [provider]  OVER THE COUNTER MEDICATION Take 1 tablet by mouth in the morning and at bedtime. Glucoceil   Yes [provider]  tadalafil  (CIALIS ) 5 MG tablet Take 1 tablet (5 mg total)  by mouth daily as needed for erectile dysfunction. 12/06/20  Yes Watt Rush, MD  vitamin E 180 MG (400 UNITS) capsule Take 400 Units by mouth daily.   Yes [provider]  Vitamin Mixture (ESTER-C PO) Take 1,000 mg by mouth daily.   Yes [provider]    Discontinued Meds:   Medications Discontinued During This Encounter  Medication Reason   0.9 %  sodium chloride  infusion    lactated ringers  infusion Patient Transfer   insulin  aspart (novoLOG ) injection 0-7 Units Patient Transfer   lidocaine -EPINEPHrine (XYLOCAINE  W/EPI) 1 %-1:100000 (with pres) injection Patient Transfer   0.9 % irrigation (POUR BTL) Patient Transfer   Surgifoam 1 Gm with Thrombin 5,000 units (5 ml) topical solution Patient Transfer   HYDROmorphone  (DILAUDID ) injection 0.25-0.5 mg Patient Transfer   droperidol (INAPSINE) 2.5 MG/ML injection 0.625 mg Patient Transfer   insulin  glargine-yfgn (SEMGLEE) injection 28 Units    midazolam  (VERSED ) 2 MG/2ML injection Returned to ADS   Thrombi-Pad 3X3 pad 1 each    insulin  aspart (novoLOG ) injection 0-6 Units    insulin  glargine-yfgn (SEMGLEE) injection 20 Units    phenylephrine (NEO-SYNEPHRINE) 20mg /NS 250mL premix infusion    heparin  ADULT infusion 100 units/mL (25000 units/250mL)    fentaNYL  (SUBLIMAZE ) injection 25-50 mcg    midazolam  PF (VERSED ) injection 4 mg    norepinephrine (LEVOPHED) 4mg  in 250mL (0.016 mg/mL) premix infusion    heparin  ADULT infusion 100 units/mL (25000 units/250mL)    vasopressin (PITRESSIN) 20 Units in 100 mL (0.2 unit/mL) infusion-*FOR SHOCK*    dexmedetomidine (PRECEDEX) 400 MCG/100ML (4 mcg/mL) infusion    midazolam  (VERSED ) 100 mg/100 mL (1 mg/mL) premix infusion    calcium  chloride injection 1 g    Oral care mouth rinse    lactated ringers  infusion    fentaNYL  in NS (63mcg/ml) infusion-PREMIX    fentaNYL  (SUBLIMAZE ) bolus via infusion 25-100 mcg    EPINEPHrine (ADRENALIN) 5 mg in NS 250 mL (0.02 mg/mL) premix infusion    midazolam  (VERSED ) bolus via infusion 2 mg    insulin  regular, human (MYXREDLIN) 100 units/ 100 mL infusion    dextrose  50 % solution 0-50 mL    propofol  (DIPRIVAN ) 1000 MG/100ML infusion    lidocaine  (XYLOCAINE ) 1 % (with pres) injection 20 mL    glycopyrrolate (ROBINUL) injection 0.1 mg    heparin  injection 1,000-6,000 Units    ceFEPIme (MAXIPIME) 2 g in sodium chloride  0.9 % 100 mL IVPB    ceFEPIme (MAXIPIME) 2 g in sodium chloride  0.9 % 100 mL IVPB    feeding supplement (PROSource TF20) liquid 60 mL    insulin  aspart (novoLOG ) injection 0-9 Units    insulin  aspart (novoLOG ) injection 2 Units    midodrine (PROAMATINE) tablet 5 mg    midodrine (PROAMATINE) tablet 5 mg    pantoprazole  (PROTONIX ) injection 40 mg    polyethylene glycol (MIRALAX / GLYCOLAX) packet 17 g    insulin  aspart (novoLOG ) injection 4 Units    prismasol BGK 4/2.5 infusion    midodrine (PROAMATINE) tablet 5 mg    insulin  glargine-yfgn (SEMGLEE) injection 10 Units    bisacodyl  (DULCOLAX) suppository 10 mg    oxyCODONE  (Oxy IR/ROXICODONE ) immediate release tablet 5 mg    norepinephrine (LEVOPHED) 16 mg in 250mL (0.064 mg/mL) premix infusion    insulin  glargine-yfgn (SEMGLEE) injection 20 Units    insulin  aspart (novoLOG ) injection 6 Units    midodrine (PROAMATINE) tablet 10 mg    insulin  aspart (novoLOG ) injection  0-15 Units    feeding supplement (VITAL 1.5 CAL) liquid 1,000 mL    midodrine (PROAMATINE) tablet 5 mg    insulin  glargine-yfgn (SEMGLEE) injection 30 Units    feeding supplement (PROSource TF20) liquid 60 mL    feeding supplement (GLUCERNA 1.5 CAL) liquid 1,000 mL    feeding supplement (PROSource TF20) liquid 60 mL    acetaminophen   (TYLENOL ) tablet 1,000 mg    insulin  glargine-yfgn (SEMGLEE) injection 40 Units    acetaminophen  (TYLENOL ) tablet 1,000 mg    insulin  glargine-yfgn (SEMGLEE) injection 30 Units     Social History:  reports that he has never smoked. He has never used smokeless tobacco. He reports that he does not drink alcohol and does not use drugs.  Family History:  History reviewed. No pertinent family history.  Blood pressure (!) 131/55, pulse 78, temperature 99.2 F (37.3 C), temperature source Axillary, resp. rate (!) 23, height 6' 1 (1.854 m), weight 78 kg, SpO2 99%. Physical Exam: General appearance: alert, cooperative, and appears stated age Head: NCAT Neck: no adenopathy, brace Resp: CTA b/l Cardio: regular rate and rhythm GI: SNDNT Extremities: tr edema Pulses: 2+ and symmetric     Temisha Murley, [facility] ORN, MD 07/27/2024, 1:02 PM

## 2024-07-27 NOTE — Progress Notes (Addendum)
 eLink Physician-Brief Progress Note Patient Name: Lawrence [name] DOB: 11/28/1945 MRN: [id number]   Date of Service  07/27/2024  HPI/Events of Note  78 y/o gentleman with a history of DM, HTN who presented with ground level fall causing numbness in his lower extremities and urinary incontinence.    Art Line and Cuff pressures that don't match, been going off the cuff, transferred units tonights and no clear orders about goals  eICU Interventions  Use cuff to titrate meds   0446 -now febrile, change Tylenol  signature to include fevers  Intervention Category Intermediate Interventions: Hypotension - evaluation and management  Markasia Carrol 07/27/2024, 2:04 AM

## 2024-07-27 NOTE — Progress Notes (Signed)
 Speech Language Pathology Treatment: Dysphagia  Patient Details Name: Lawrence [name] MRN: [id number] DOB: 1946-09-14 Today's Date: 07/27/2024 Time: 9067-9054 SLP Time Calculation (min) (ACUTE ONLY): 13 min  Assessment / Plan / Recommendation Clinical Impression  Patient was alert and awake. There was no family at bedside. Today's session focused on training and trial use of Expiratory Muscle Strength Training (EMST). Patient completed one set of 5 exhales with 20 seconds of rest inbetween each exhale. Patient was receptive to trying EMST and worked hard throughout session. Patient asked for further instructions with EMST at the end of session. SLP will continue to follow and practice EMST with patient.    HPI HPI: 78 yo admitted 10/26 s/p fall with onset of numbness in BLE. Neurosurgery consulted, pt taken to OR for partial anterior cervical discectomy but procedure aborted secondary to multiple PEA arrests, intubated. Pt with acute biventricular HF, anuric AKI requiring initiation of CRRT. ETT 10/26-28. Mechanical thrombectory for bilateral PE 10/27. Cortrak 10/29. PMH includes DM, HTN, BPH s/p TURP      SLP Plan  Continue with current plan of care          Recommendations  Diet recommendations: Regular Medication Administration: Via alternative means                  Oral care QID   Other (comment)       Continue with current plan of care   Damien Hy  Graduate SLP Clinican

## 2024-07-27 NOTE — Progress Notes (Signed)
 Inpatient Rehab Coordinator Note:  I met with patient and his daughter at bedside to discuss CIR recommendations and goals/expectations of CIR stay.  We reviewed 3 hrs/day of therapy, physician follow up, and average length of stay 2 weeks (dependent upon progress) with goals of min to mod assist.  Pt sleepy throughout but does answer questions when asked.  Daughter reports plan to try and get pt back to his own home with 24/7 care at home.  We discussed needing a ramp.  Barrier to CIR currently decision on dialysis needs, neurosurgical plan, and also question increasing leukocytosis with low grade temps.  We will continue to follow.   Reche Lowers, PT, DPT Admissions Coordinator [phone] 07/27/24 1:02 PM

## 2024-07-27 NOTE — Evaluation (Addendum)
 Speech Language Pathology Evaluation Patient Details Name: Lawrence [name] MRN: [id number] DOB: 10/06/1945 Today's Date: 07/27/2024 Time: 0945-1000 SLP Time Calculation (min) (ACUTE ONLY): 15 min  Problem List:  Patient Active Problem List   Diagnosis Date Noted   AKI (acute kidney injury) 07/20/2024   Elevated CK 07/18/2024   History of sensory changes 07/18/2024   Weakness 07/18/2024   C7 spinal cord injury (HCC) 07/17/2024   Status post surgery 07/17/2024   Paraplegia (HCC) 07/17/2024   BPH with obstruction/lower urinary tract symptoms 12/09/2022   Precordial chest pain 12/23/2020   Chest pain    Abnormal ECG    Past Medical History:  Past Medical History:  Diagnosis Date   Arthritis    Diabetes mellitus without complication (HCC)    GERD (gastroesophageal reflux disease)    Hypertension    Sleep apnea    Past Surgical History:  Past Surgical History:  Procedure Laterality Date   ANTERIOR CERVICAL DECOMP/DISCECTOMY FUSION N/A 07/17/2024   Procedure: ANTERIOR CERVICAL DECOMPRESSION/DISCECTOMY FUSION, CERVICAL SEVEN - THORACIC ONE;  Surgeon: Debby Dorn MATSU, MD;  Location: [facility] OR;  Service: Neurosurgery;  Laterality: N/A;  Partial Discecomy Cervical seven- Thoracic one   APPENDECTOMY     78 yo   BACK SURGERY  1975   lumbar diskectomy   done [facility]   COLONOSCOPY N/A 02/21/2015   Procedure: COLONOSCOPY;  Surgeon: Claudis RAYMOND Rivet, MD;  Location: [facility];  Service: Endoscopy;  Laterality: N/A;  1025   COLONOSCOPY  11/13/2021   COLONOSCOPY WITH PROPOFOL  N/A 11/13/2021   Procedure: COLONOSCOPY WITH PROPOFOL ;  Surgeon: Rivet Claudis RAYMOND, MD;  Location: [facility];  Service: Endoscopy;  Laterality: N/A;  155   CYSTOSCOPY WITH URETHRAL DILATATION N/A 12/09/2022   Procedure: CYSTOSCOPY WITH URETHRAL DILATATION;  Surgeon: Watt Rush, MD;  Location: [facility] ORS;  Service: Urology;  Laterality: N/A;   EYE SURGERY Bilateral    Lasik surgery   IR ANGIOGRAM PULMONARY  BILATERAL SELECTIVE  07/18/2024   IR ANGIOGRAM SELECTIVE EACH ADDITIONAL VESSEL  07/18/2024   IR THROMBECT PRIM MECH ADD (INCLU) MOD SED  07/18/2024   IR THROMBECT PRIM MECH ADD (INCLU) MOD SED  07/18/2024   IR THROMBECT PRIM MECH ADD (INCLU) MOD SED  07/18/2024   LEFT HEART CATH AND CORONARY ANGIOGRAPHY N/A 12/23/2020   Procedure: LEFT HEART CATH AND CORONARY ANGIOGRAPHY;  Surgeon: Burnard Debby LABOR, MD;  Location: [facility] INVASIVE CV [facility];  Service: Cardiovascular;  Laterality: N/A;   NECK SURGERY  1999   Work injury, posterior fusion   POLYPECTOMY  11/13/2021   Procedure: POLYPECTOMY INTESTINAL;  Surgeon: Rivet Claudis RAYMOND, MD;  Location: [facility];  Service: Endoscopy;;   SHOULDER SURGERY Bilateral 2000   rotoator cuff repair   done at [facility]   TONSILLECTOMY     TRANSURETHRAL INCISION OF PROSTATE N/A 12/09/2022   Procedure: TRANSURETHRAL RESECTION OF PROSTATE;  Surgeon: Watt Rush, MD;  Location: [facility] ORS;  Service: Urology;  Laterality: N/A;  1 HR FOR CASE   HPI:  78 yo admitted 10/26 s/p fall with onset of numbness in BLE. Neurosurgery consulted, pt taken to OR for partial anterior cervical discectomy but procedure aborted secondary to multiple PEA arrests, intubated. Pt with acute biventricular HF, anuric AKI requiring initiation of CRRT. ETT 10/26-28. Mechanical thrombectory for bilateral PE 10/27. Cortrak 10/29. PMH includes DM, HTN, BPH s/p TURP   Assessment / Plan / Recommendation Clinical Impression  Patient was alert and awake but appeared a  little drowsy. Patient's voice has noticiably improved since yesterday's session. His voice is still breathy and low but noticiable improvement compared to yesterday's session. Patient was oriented to person, place, and situation but stated that it was May. Patient said this twice throughout the session. Patient repeated 3 numbers/words but experienced difficulty when asked to repeat 4-5 numbers or words. Patient read and followed 4 one step  commands with 100% accuracy. Patient was less than 50% accurate on two step commands. Patient answered all basic yes/no questions with 100% accuracy. SLP showed patient a calendar of November and asked him to identify days of the week and dates. Patient read the month and date but experienced difficulty in identifying specific days of the week. SLP will continue to follow.    SLP Assessment  SLP Recommendation/Assessment: Patient needs continued Speech Language Pathology Services SLP Visit Diagnosis: Cognitive communication deficit (R41.841)     Assistance Recommended at Discharge  Other (comment)  Functional Status Assessment Patient has had a recent decline in their functional status and demonstrates the ability to make significant improvements in function in a reasonable and predictable amount of time.  Frequency and Duration min 2x/week  1 week      SLP Evaluation Cognition  Overall Cognitive Status: Impaired/Different from baseline Arousal/Alertness: Awake/alert Orientation Level: Oriented to person;Oriented to place;Disoriented to time;Oriented to situation Year: 2025 Month: May Attention: Focused Focused Attention: Appears intact Memory: Impaired Memory Impairment: Storage deficit Executive Function: Sequencing Sequencing: Impaired Sequencing Impairment: Verbal basic       Comprehension  Auditory Comprehension Overall Auditory Comprehension: Appears within functional limits for tasks assessed Yes/No Questions: Within Functional Limits Commands: Impaired One Step Basic Commands: 75-100% accurate Two Step Basic Commands: 25-49% accurate Conversation: Simple EffectiveTechniques: Extra processing time Visual Recognition/Discrimination Discrimination: Within Function Limits Reading Comprehension Reading Status: Within funtional limits    Expression Expression Primary Mode of Expression: Verbal Verbal Expression Overall Verbal Expression: Appears within functional  limits for tasks assessed Repetition: Impaired Level of Impairment: Phrase level;Sentence level Pragmatics: No impairment   Oral / Motor  Motor Speech Overall Motor Speech: Appears within functional limits for tasks assessed Phonation: Breathy;Low vocal intensity Resonance: Within functional limits Articulation: Within functional limitis Intelligibility: Intelligible Motor Planning: Within functional limits Motor Speech Errors: Not applicable Effective Techniques: Increased vocal intensity           Damien Hy  Graduate SLP Clinican

## 2024-07-27 NOTE — Progress Notes (Signed)
 NAME:  Lawrence [name], MRN:  [id number], DOB:  [birth year], LOS: 10 ADMISSION DATE:  07/16/2024, CONSULTATION DATE: 07/17/2024 REFERRING MD:  Debby Carrier , CHIEF COMPLAINT: Status postcardiac arrest  History of Present Illness:  78 year old male with diabetes, hypertension, BPH status post TURP who presented 10/25 after having a ground-level fall with recent onset of numbness in his lower extremity- complete loss of sensation to BLE & urinary incontinence after fall.  Neurosurgery was consulted, patient taken to OR for partial anterior cervical discectomy C7-T1 but course was complicated with multiple time PEA cardiac arrest in OR, procedure was aborted, TEE was done which showed severe RV dysfunction and dilatation with moderate to severe LV dysfunction, patient was transferred to ICU for close monitoring.    In ICU patient blood pressure started dropping and he went into PEA cardiac arrest again, ROSC was achieved after 2 minutes.  CTA PE study with extensive pulmonary artery thrombus bilaterally with evidence of right heart strain. Unfortunately due to surgery, it was recommended no TNK/TPA. 10/27 taken for thrombectomy.   Pertinent  Medical History   Past Medical History:  Diagnosis Date   Arthritis    Diabetes mellitus without complication (HCC)    GERD (gastroesophageal reflux disease)    Hypertension    Sleep apnea     Significant Hospital Events: Including procedures, antibiotic start and stop dates in addition to other pertinent events   Admitted and PCCM consulted, coded multiple times in OR (PEA) 07/18/2024 mechanical thrombectomy was done for bilateral pulmonary embolism. 10/28 nephro consult 10/29 on 5 NE, plan for HD cath placement and CRRT  10/30 add midodrine   Interim History / Subjective:  Late entry- patient seen yesterday on rounds with family at bedside.   No change in sensation.  No SOB. Planning for CT once CRRT clots.    Objective    Blood pressure (!)  131/55, pulse 65, temperature 100 F (37.8 C), temperature source Axillary, resp. rate 14, height 6' 1 (1.854 m), weight 78 kg, SpO2 100%. CVP:  [0 mmHg-10 mmHg] 1 mmHg      Intake/Output Summary (Last 24 hours) at 07/27/2024 1116 Last data filed at 07/27/2024 0700 Gross per 24 hour  Intake 1604.85 ml  Output 50 ml  Net 1554.85 ml   Filed Weights   07/25/24 0500 07/26/24 0600 07/27/24 0500  Weight: 81.8 kg 73.8 kg 78 kg    Examination: Physical exam: General: ill appearing man sitting up in bed in NAD HEENT: C-collar, dressing on anterior neck Neuro: awake, alert, weak and symmetric grip strength, more intact strength in proximal UE, not able to move lowers. Intact sensation LE.  Chest: Weaker voice today, no conversational dyspnea. CTA anteriorly, reduced basilar breath sounds. Heart: S1S2, RRR Abdomen: soft, NT   Resolved problem list  Lactic acidosis  Endotracheally intubated Shock liver  Assessment and Plan   PEA arrest in setting of massive PE s/p thrombectomy  Acute BiV HF - improving Hypotension, question in setting of cardiogenic vs neurogenic shock  -Status post mechanical thrombectomy on 07/18/2024 Plan -tele monitoring -not requiring inotropic support anymore  Acute hypoxic hypercarbic respiratory failure in setting of spinal injury and diaphragmatic weakness  Traumatic Rib fx Concern for bilateral PNA - s/p Rocephin for 5 days Hx OSA  Plan  - pulmonary hygiene -supplemental O2 to maintain SpO2 >92% for SCI  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  -s/p partial C7-T1 anterior discectomy, aborted 2/2 intra-op PEA arrest  Plan -Appreciate NS's management -CT  today: showed hematoma in anterior neck beneath incision.  -defer to NS timing of future surgeries  Acute renal injury in setting of multiple cardiac arrest, ischemic ATN, hypotension, contrast  Plan -appreciate nephrology's management -off CRRT once it clots -strict  I/O  Anemia Plan -transfuse for Hb <7 or hemodynamically significant bleeding\  DM w hyperglycemia- controlled Plan -glargine 50 units BID -SSI PRN -TF coverage 8units q4h  Inadequate PO intake Dysphagia  -cortrak, TF -SLP consulted  Family updated at bedside during rounds with patient.   Labs   CBC: Recent Labs  Lab 07/23/24 0330 07/24/24 0732 07/25/24 0401 07/26/24 0354 07/27/24 0525  WBC 14.3* 16.4* 15.9* 15.9* 16.0*  HGB 7.6* 7.8* 8.1* 8.2* 7.7*  HCT 23.1* 23.8* 24.8* 25.6* 24.6*  MCV 94.3 96.0 96.9 98.5 100.4*  PLT 135* 148* 186 197 214    Basic Metabolic Panel: Recent Labs  Lab 07/23/24 0330 07/23/24 1600 07/24/24 0445 07/24/24 1550 07/25/24 0401 07/25/24 1635 07/26/24 0354 07/26/24 1626 07/27/24 0525  NA 133*   < > 134*   < > 134* 134* 135 135 134*  K 5.1   < > 4.9   < > 4.6 4.5 4.4 5.0 5.6*  CL 100   < > 100   < > 98 100 99 101 97*  CO2 23   < > 23   < > 23 22 25 22 22   GLUCOSE 291*   < > 243*   < > 256* 266* 156* 189* 258*  BUN 54*   < > 49*   < > 47* 51* 50* 72* 105*  CREATININE 2.23*   < > 2.13*   < > 1.86* 1.81* 1.85* 2.56* 3.95*  CALCIUM 8.0*   < > 8.5*   < > 8.8* 9.0 9.2 8.9 8.9  MG 2.2  --  2.1  --  2.0  --  2.3  --  2.6*  PHOS 2.0*  1.8*   < > 3.1  3.0   < > 3.1 2.6 2.7 3.6 3.6   < > = values in this interval not displayed.   GFR: Estimated Creatinine Clearance: 17.6 mL/min (A) (by C-G formula based on SCr of 3.95 mg/dL (H)). Recent Labs  Lab 07/24/24 0732 07/25/24 0401 07/26/24 0354 07/27/24 0525  WBC 16.4* 15.9* 15.9* 16.0*    Liver Function Tests: Recent Labs  Lab 07/25/24 0401 07/25/24 1635 07/26/24 0354 07/26/24 1626 07/27/24 0525  AST  --  31  --   --   --   ALT  --  128*  --   --   --   ALKPHOS  --  79  --   --   --   BILITOT  --  1.0  --   --   --   PROT  --  5.7*  --   --   --   ALBUMIN 2.0* 2.1*  2.1* 2.2* 2.1* 2.3*    Leita SHAUNNA Gaskins, DO 07/27/24 11:28 AM [facility]  For contact  information, see Amion. If no response to pager, please call PCCM consult pager. After hours, 7PM- 7AM, please call Elink.

## 2024-07-27 NOTE — Progress Notes (Signed)
 The patient has completed dialysis treatment and goal met.  07/27/24 1933  Vitals  Temp 97.7 F (36.5 C)  Temp Source Axillary  BP (!) 130/56  BP Location Right Arm  BP Method Automatic  Oxygen Therapy  O2 Device Room Air  During Treatment Monitoring  Intra-Hemodialysis Comments Tx completed  Post Treatment  Dialyzer Clearance Lightly streaked  Hemodialysis Intake (mL) 0 mL  Liters Processed 80  Fluid Removed (mL) 2000 mL  Tolerated HD Treatment Yes  Post-Hemodialysis Comments goal met.  Hemodialysis Catheter Left Femoral vein Triple lumen Temporary (Non-Tunneled)  Placement Date/Time: 07/20/24 (c) 1900   Orientation: Left  Access Location: Femoral vein  Hemodialysis Catheter Type: Triple lumen Temporary (Non-Tunneled)  Blue Lumen Status Heparin  locked  Red Lumen Status Heparin  locked  Catheter fill solution Heparin  1000 units/ml  Catheter fill volume (Arterial) 1.4 cc  Catheter fill volume (Venous) 1.4  Dressing Type Transparent  Dressing Status Clean, Dry, Intact;Antimicrobial disc/dressing in place  Interventions New dressing  Drainage Description None  Dressing Change Due 07/30/24

## 2024-07-27 NOTE — Progress Notes (Signed)
 eLink Physician-Brief Progress Note Patient Name: Lawrence [name] DOB: 03-Apr-1946 MRN: [id number]   Date of Service  07/27/2024  HPI/Events of Note  78 y/o gentleman with a history of DM, HTN who presented with ground level fall causing numbness in his lower extremities and urinary incontinence.   Notified by staff that the patient has stopped talking.  He is nodding appropriately following commands, and is otherwise neurologically intact, but now refuses to talk despite talking all day prior to HD  eICU Interventions  Appears that the patient is fatigued, unclear that this is CVA syndrome.  Will request a ground team evaluation for better neurological examination   0309 - CT head reviewed, no intervention  Intervention Category Intermediate Interventions: Communication with other healthcare providers and/or family  Ria Hemming 07/27/2024, 11:20 PM

## 2024-07-27 NOTE — Progress Notes (Signed)
 Physical Therapy Treatment Patient Details Name: Lawrence [name] MRN: [id number] DOB: Jan 22, 1946 Today's Date: 07/27/2024   History of Present Illness Pt is a 78 y.o. male who presented 07/16/24 s/p fall in which he hit his neck and head. MRI showed cord signal change C7-T1 concerning for spinal cord infarct or possibly transverse myelitis. Imaging also showed hairline fracture of the T1 body, mild PLC edema without disruption. S/p aborted partial anterior cervical discectomy C7-T1 due to pt going into PEA cardiac arrest 10/26. Went into PEA cardiac arrest again in ICU. Pt found to have large bil PE. S/p mechanical thrombectomy 10/27. ETT 10/26-10/28. Started CRRT 10/29-11/4. PMH: arthritis, DM, GERD, HTN, sleep apnea, BPH status post TURP    PT Comments  Pt more alert and interactive this date despite lethargy/sleepiness from recent pain medication. Pt tolerated transfer via maxisky this date oob to chair. Pt continues with flaccidity in bilat LEs but demo's good PROM t/o. Pt with noted active tricep and bicep bilaterally but elbow extension stronger. Pt to continue to benefit from aggressive inpatient rehab program > 3 hours a day as patient with significant change in function that will result in lifestyle change in which both patient and family will need extensive training for mobility and care. Acute PT to cont to follow.    If plan is discharge home, recommend the following: Two people to help with walking and/or transfers;Two people to help with bathing/dressing/bathroom;Assistance with cooking/housework;Direct supervision/assist for medications management;Direct supervision/assist for financial management;Assistance with feeding;Assist for transportation;Help with stairs or ramp for entrance;Supervision due to cognitive status   Can travel by private vehicle        Equipment Recommendations  Hospital bed;Wheelchair (measurements PT);Wheelchair cushion (measurements PT);BSC/3in1;Hoyer lift (air  mattress; roho cushion; custom w/c; slide board)    Recommendations for Other Services Rehab consult     Precautions / Restrictions Precautions Precautions: Fall;Cervical Precaution Booklet Issued: No Precaution/Restrictions Comments: A-line, MAP > 65, cardiac arrest x2 this admission Required Braces or Orthoses: Cervical Brace Cervical Brace: Hard collar;At all times Restrictions Weight Bearing Restrictions Per Provider Order: No     Mobility  Bed Mobility Overal bed mobility: Needs Assistance Bed Mobility: Rolling Rolling: Total assist, +2 for physical assistance         General bed mobility comments: pt with no initiation of rolling either L/R but tolerated well. rolled L/R to don maxisky lift pad    Transfers                   General transfer comment: transferred OOB to chair via maxisky, pt tolerated well    Ambulation/Gait               General Gait Details: unable, cannot move legs   Stairs             Wheelchair Mobility     Tilt Bed    Modified Rankin (Stroke Patients Only)       Balance                                            Communication Communication Communication: Impaired Factors Affecting Communication: Reduced clarity of speech  Cognition Arousal: Alert, Lethargic (sleepy, RN reports giving him pain medication prior to PT session) Behavior During Therapy: Flat affect   PT - Cognitive impairments: Difficult to assess Difficult to assess due to: Impaired communication  PT - Cognition Comments: pt with difficulty staying awake but did initiate conversation at times and try to vocalize several times t/o session Following commands: Impaired Following commands impaired: Follows one step commands with increased time, Follows one step commands inconsistently    Cueing Cueing Techniques: Verbal cues, Tactile cues, Gestural cues  Exercises Other Exercises Other  Exercises: AAROM bil shoulder flex Other Exercises: AAROM bil elbow flex/ext Other Exercises: AAROM bil wrist flex/ext Other Exercises: AAROM bil digit composite flex/ext Other Exercises: bilat LE ROM to hip/knee/ankle    General Comments General comments (skin integrity, edema, etc.): VSS on RA      Pertinent Vitals/Pain Pain Assessment Pain Assessment: Faces Faces Pain Scale: Hurts a little bit Pain Location: grimacing with movement in general Pain Descriptors / Indicators: Discomfort, Grimacing Pain Intervention(s): Monitored during session    Home Living                          Prior Function            PT Goals (current goals can now be found in the care plan section) Acute Rehab PT Goals PT Goal Formulation: With patient Time For Goal Achievement: 08/05/24 Potential to Achieve Goals: Good Progress towards PT goals: Progressing toward goals    Frequency    Min 3X/week      PT Plan      Co-evaluation              AM-PAC PT 6 Clicks Mobility   Outcome Measure  Help needed turning from your back to your side while in a flat bed without using bedrails?: Total Help needed moving from lying on your back to sitting on the side of a flat bed without using bedrails?: Total Help needed moving to and from a bed to a chair (including a wheelchair)?: Total Help needed standing up from a chair using your arms (e.g., wheelchair or bedside chair)?: Total Help needed to walk in hospital room?: Total Help needed climbing 3-5 steps with a railing? : Total 6 Click Score: 6    End of Session Equipment Utilized During Treatment: Cervical collar Activity Tolerance: Patient tolerated treatment well Patient left: with call bell/phone within reach;with nursing/sitter in room;in chair;with family/visitor present (daughter present) Nurse Communication: Mobility status PT Visit Diagnosis: Muscle weakness (generalized) (M62.81);History of falling  (Z91.81);Difficulty in walking, not elsewhere classified (R26.2);Other symptoms and signs involving the nervous system (R29.898);Other abnormalities of gait and mobility (R26.89);Pain Pain - part of body:  (Neck)     Time: 8960-8873 PT Time Calculation (min) (ACUTE ONLY): 47 min  Charges:    $Therapeutic Exercise: 23-37 mins $Therapeutic Activity: 8-22 mins PT General Charges $$ ACUTE PT VISIT: 1 Visit                     Norene Ames, PT, DPT Acute Rehabilitation Services Secure chat preferred Office #: [phone]    Norene CHRISTELLA Ames 07/27/2024, 11:38 AM

## 2024-07-27 NOTE — Progress Notes (Signed)
 NAME:  Lawrence [name], MRN:  [id number], DOB:  11-10-1945, LOS: 10 ADMISSION DATE:  07/16/2024, CONSULTATION DATE: 07/17/2024 REFERRING MD:  Debby Carrier , CHIEF COMPLAINT: Status postcardiac arrest  History of Present Illness:  78 year old male with diabetes, hypertension, BPH status post TURP who presented 10/25 after having a ground-level fall with recent onset of numbness in his lower extremity- complete loss of sensation to BLE & urinary incontinence after fall.  Neurosurgery was consulted, patient taken to OR for partial anterior cervical discectomy C7-T1 but course was complicated with multiple time PEA cardiac arrest in OR, procedure was aborted, TEE was done which showed severe RV dysfunction and dilatation with moderate to severe LV dysfunction, patient was transferred to ICU for close monitoring.    In ICU patient blood pressure started dropping and he went into PEA cardiac arrest again, ROSC was achieved after 2 minutes.  CTA PE study with extensive pulmonary artery thrombus bilaterally with evidence of right heart strain. Unfortunately due to surgery, it was recommended no TNK/TPA. 10/27 taken for thrombectomy.   Pertinent  Medical History   Past Medical History:  Diagnosis Date   Arthritis    Diabetes mellitus without complication (HCC)    GERD (gastroesophageal reflux disease)    Hypertension    Sleep apnea     Significant Hospital Events: Including procedures, antibiotic start and stop dates in addition to other pertinent events   Admitted and PCCM consulted, coded multiple times in OR (PEA) 07/18/2024 mechanical thrombectomy was done for bilateral pulmonary embolism. 10/28 nephro consult 10/29 on 5 NE, plan for HD cath placement and CRRT  10/30 add midodrine  11/3 remain on CRRT, came off of vasopressors and inotropic support. 11/4 CRRT was stopped, not making much urine  Interim History / Subjective:  Patient was transferred from to heart to hold out  overnight No overnight issues, remain off CRRT Made only 50 cc urine in last 24 hours, serum creatinine is trending up with potassium 5.6  Objective    Blood pressure (!) 131/55, pulse 65, temperature 100 F (37.8 C), temperature source Axillary, resp. rate 14, height 6' 1 (1.854 m), weight 78 kg, SpO2 100%. CVP:  [0 mmHg-11 mmHg] 1 mmHg      Intake/Output Summary (Last 24 hours) at 07/27/2024 9090 Last data filed at 07/27/2024 0700 Gross per 24 hour  Intake 1768.75 ml  Output 50 ml  Net 1718.75 ml   Filed Weights   07/25/24 0500 07/26/24 0600 07/27/24 0500  Weight: 81.8 kg 73.8 kg 78 kg    Examination: General: Acutely ill-appearing elderly male, lying in the bed HEENT: [place]/AT, c-collar in place, eyes anicteric.  Severely dry mucus membranes Neuro: Alert, awake following commands, antigravity in both upper extremities, weaker on left side, plegic bilateral lower extremities Chest: Coarse breath sounds, no wheezes or rhonchi Heart: Regular rate and rhythm, no murmurs or gallops Abdomen: Soft, nontender, nondistended, bowel sounds present  Labs and images reviewed  Patient Lines/Drains/Airways Status     Active Line/Drains/Airways     Name Placement date Placement time Site Days   Arterial Line 07/17/24 Left Radial 07/17/24  1340  Radial  10   Peripheral IV 07/17/24 18 G Right Forearm 07/17/24  1227  Forearm  10   CVC Double Lumen 07/17/24 Right Internal jugular 07/17/24  --  -- 10   Hemodialysis Catheter Left Femoral vein Triple lumen Temporary (Non-Tunneled) 07/20/24  1900  Femoral vein  7   Urethral Catheter Warren Danforth RN Latex 16  Fr. 07/17/24  1230  Latex  10   Small Bore Feeding Tube 10 Fr. Left nare Marking at nare/corner of mouth 69 cm 07/20/24  1055  Left nare  7   Wound 07/17/24 1413 Surgical Closed Surgical Incision Neck 07/17/24  1413  Neck  10   Wound 07/18/24 1420 Other (Comment) Thigh Anterior;Proximal;Right 07/18/24  1420  Thigh  9   Wound 07/23/24 1550  Pressure Injury Buttocks Left;Medial Stage 2 -  Partial thickness loss of dermis presenting as a shallow open injury with a red, pink wound bed without slough. 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 AKI due to ischemic ATN in setting of cardiac arrest Hyperkalemia/hypervolemic hyponatremia Shock liver, improving Anemia of critical illness Poorly controlled diabetes with hyperglycemia Dysphagia  Continue supportive care Patient is on IV heparin  infusion Repeat echocardiogram showing low normal RV function with grade 1 diastolic dysfunction Remain off inotropic and vasopressor support Shock is resolved Remain on room air CT spine was done yesterday, showing C7 transverse process displaced fracture Also showing hematoma on left side of cervical vertebra Will await neurosurgery input Serum creatinine continue to rise, patient is not making much urine, remain anuric Nephrology is following LFTs continue to improve Monitor H&H and transfuse if less than 7 Blood sugars are not well-controlled Increase Lantus to 50 units twice daily Continue sliding scale and tube feed coverage insulin  with CBG goal 140-180 Continue tube feeds   Labs   CBC: Recent Labs  Lab 07/23/24 0330 07/24/24 0732 07/25/24 0401 07/26/24 0354 07/27/24 0525  WBC 14.3* 16.4* 15.9* 15.9* 16.0*  HGB 7.6* 7.8* 8.1* 8.2* 7.7*  HCT 23.1* 23.8* 24.8* 25.6* 24.6*  MCV 94.3 96.0 96.9 98.5 100.4*  PLT 135* 148* 186 197 214     Basic Metabolic Panel: Recent Labs  Lab 07/23/24 0330 07/23/24 1600 07/24/24 0445 07/24/24 1550 07/25/24 0401 07/25/24 1635 07/26/24 0354 07/26/24 1626 07/27/24 0525  NA 133*   < > 134*   < > 134* 134* 135 135 134*  K 5.1   < > 4.9   < > 4.6 4.5 4.4 5.0 5.6*  CL 100   < > 100   < > 98 100 99 101 97*  CO2 23   < > 23   < > 23 22 25 22 22   GLUCOSE 291*   < > 243*   < > 256* 266* 156* 189* 258*  BUN 54*   < > 49*   < > 47* 51* 50* 72* 105*  CREATININE 2.23*   < > 2.13*   < > 1.86* 1.81* 1.85* 2.56* 3.95*  CALCIUM 8.0*   < > 8.5*   < > 8.8* 9.0 9.2 8.9 8.9  MG 2.2  --  2.1  --  2.0  --  2.3  --  2.6*  PHOS 2.0*  1.8*   < > 3.1  3.0   < > 3.1 2.6 2.7 3.6 3.6   < > = values in this interval not displayed.  GFR: Estimated Creatinine Clearance: 17.6 mL/min (A) (by C-G formula based on SCr of 3.95 mg/dL (H)). Recent Labs  Lab 07/24/24 0732 07/25/24 0401 07/26/24 0354 07/27/24 0525  WBC 16.4* 15.9* 15.9* 16.0*    Liver Function Tests: Recent Labs  Lab 07/25/24 0401 07/25/24 1635 07/26/24 0354 07/26/24 1626 07/27/24 0525  AST  --  31  --   --   --   ALT  --  128*  --   --   --   ALKPHOS  --  79  --   --   --   BILITOT  --  1.0  --   --   --   PROT  --  5.7*  --   --   --   ALBUMIN 2.0* 2.1*  2.1* 2.2* 2.1* 2.3*   No results for input(s): LIPASE, AMYLASE in the last 168 hours.  No results for input(s): AMMONIA in the last 168 hours.  ABG    Component Value Date/Time   PHART 7.461 (H) 07/19/2024 0113   PCO2ART 29.2 (L) 07/19/2024 0113   PO2ART 158 (H) 07/19/2024 0113   HCO3 20.8 07/19/2024 0113   TCO2 22 07/19/2024 0113   ACIDBASEDEF 3.0 (H) 07/19/2024 0113   O2SAT 63.1 07/20/2024 1200     Coagulation Profile: No results for input(s): INR, PROTIME in the last 168 hours.   Cardiac Enzymes: No results for input(s): CKTOTAL, CKMB, CKMBINDEX, TROPONINI in the last 168 hours.   HbA1C: Hemoglobin A1C  Date/Time Value Ref Range  Status  07/23/2021 12:00 AM 11.9  Final  10/27/2020 12:00 AM 9.3  Final   HbA1c, POC (controlled diabetic range)  Date/Time Value Ref Range Status  11/19/2021 11:44 AM 10.2 (A) 0.0 - 7.0 % Final   HbA1c POC (<> result, manual entry)  Date/Time Value Ref Range Status  03/18/2022 03:21 PM 10.8 4.0 - 5.6 % Final   Hgb A1c MFr Bld  Date/Time Value Ref Range Status  07/17/2024 04:25 AM 12.3 (H) 4.8 - 5.6 % Final    Comment:    (NOTE) Diagnosis of Diabetes The following HbA1c ranges recommended by the American Diabetes Association (ADA) may be used as an aid in the diagnosis of diabetes mellitus.  Hemoglobin             Suggested A1C NGSP%              Diagnosis  <5.7                   Non Diabetic  5.7-6.4                Pre-Diabetic  >6.4                   Diabetic  <7.0                   Glycemic control for                       adults with diabetes.    12/02/2022 11:30 AM 11.9 (H) 4.8 - 5.6 % Final    Comment:    (NOTE)         Prediabetes: 5.7 - 6.4         Diabetes: >6.4         Glycemic control for adults with diabetes: <7.0     CBG: Recent Labs  Lab 07/26/24 1532 07/26/24 2003 07/26/24 2347 07/27/24 0409 07/27/24 0741  GLUCAP 154* 178* 203* 209* 181*    The patient is critically ill due to status post fall with cervical spinal cord injury leading to paraplegia, AKI in the setting of PEA cardiac arrest requiring renal replacement therapy, bilateral massive PE status post mechanical thrombectomy.  Critical care was necessary to treat or prevent imminent or life-threatening deterioration.  Critical care was time spent personally by me on the following activities: development of treatment plan with patient and/or surrogate as well as nursing, discussions with consultants, evaluation of patient's response to treatment, examination of patient, obtaining history from patient or surrogate, ordering and performing treatments and interventions, ordering and review of  laboratory studies, ordering and review of radiographic studies, pulse oximetry, re-evaluation of patient's condition and participation in multidisciplinary rounds.   During this encounter critical care time was devoted to patient care services described in this note for 40 minutes.     Valinda Novas, MD [facility] See Amion for pager If no response to pager, please call [phone] until 7pm After 7pm, Please call E-link [phone]

## 2024-07-27 NOTE — Progress Notes (Signed)
 PHARMACY - ANTICOAGULATION CONSULT NOTE  Pharmacy Consult for heparin  Indication: pulmonary embolus  Allergies  Allergen Reactions   Penicillins Hives   Lucentis [Ranibizumab] Rash    Patient Measurements: Height: 6' 1 (185.4 cm) Weight: 78 kg (171 lb 15.3 oz) IBW/kg (Calculated) : 79.9 HEPARIN  DW (KG): 81.8  Vital Signs: Temp: 100 F (37.8 C) (11/05 0754) Temp Source: Axillary (11/05 0754) BP: 131/55 (11/05 0713) Pulse Rate: 65 (11/05 0754)  Labs: Recent Labs    07/25/24 0401 07/25/24 1635 07/26/24 0340 07/26/24 0354 07/26/24 1626 07/27/24 0525  HGB 8.1*  --   --  8.2*  --  7.7*  HCT 24.8*  --   --  25.6*  --  24.6*  PLT 186  --   --  197  --  214  HEPARINUNFRC 0.56  --  0.48  --   --  0.49  CREATININE 1.86*   < >  --  1.85* 2.56* 3.95*   < > = values in this interval not displayed.    Estimated Creatinine Clearance: 17.6 mL/min (A) (by C-G formula based on SCr of 3.95 mg/dL (H)).   Assessment: JC is a 35 YOM admitted with concern for spinal cord injury. Patient taken to the OR for neurosurgery, procedure complicated by multiple rounds of CPR.   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 S/p IR 10/27 for thrombectomy.  Heparin  level 0.49 is therapeutic with heparin  running at 1200 units/hr.  Hgb stable (7.7-8.2), Platelets are within normal limits.  No bleeding noted    Goal of Therapy:  Heparin  level 0.3-0.7 units/ml Monitor platelets by anticoagulation protocol: Yes   Plan:  Continue heparin  infusion at 1200 units/hr Monitor daily heparin  level, CBC, and signs/symptoms of bleeding F/u plans for transition to oral AC when procedures complete  Harlene Boga, PharmD, BCPS, BCCCP Clinical Pharmacist Please refer to [facility] for [facility] Pharmacy numbers 07/27/2024 9:25 AM

## 2024-07-28 ENCOUNTER — Inpatient Hospital Stay (HOSPITAL_COMMUNITY)

## 2024-07-28 DIAGNOSIS — I469 Cardiac arrest, cause unspecified: Secondary | ICD-10-CM | POA: Diagnosis not present

## 2024-07-28 DIAGNOSIS — S14107D Unspecified injury at C7 level of cervical spinal cord, subsequent encounter: Secondary | ICD-10-CM | POA: Diagnosis not present

## 2024-07-28 DIAGNOSIS — I2602 Saddle embolus of pulmonary artery with acute cor pulmonale: Secondary | ICD-10-CM | POA: Diagnosis not present

## 2024-07-28 DIAGNOSIS — G8220 Paraplegia, unspecified: Secondary | ICD-10-CM | POA: Diagnosis not present

## 2024-07-28 LAB — POCT I-STAT 7, (LYTES, BLD GAS, ICA,H+H)
Acid-Base Excess: 1 mmol/L (ref 0.0–2.0)
Bicarbonate: 24 mmol/L (ref 20.0–28.0)
Calcium, Ion: 1.15 mmol/L (ref 1.15–1.40)
HCT: 23 % — ABNORMAL LOW (ref 39.0–52.0)
Hemoglobin: 7.8 g/dL — ABNORMAL LOW (ref 13.0–17.0)
O2 Saturation: 98 %
Potassium: 5.1 mmol/L (ref 3.5–5.1)
Sodium: 132 mmol/L — ABNORMAL LOW (ref 135–145)
TCO2: 25 mmol/L (ref 22–32)
pCO2 arterial: 31 mmHg — ABNORMAL LOW (ref 32–48)
pH, Arterial: 7.498 — ABNORMAL HIGH (ref 7.35–7.45)
pO2, Arterial: 87 mmHg (ref 83–108)

## 2024-07-28 LAB — RENAL FUNCTION PANEL
Albumin: 2.3 g/dL — ABNORMAL LOW (ref 3.5–5.0)
Albumin: 2.4 g/dL — ABNORMAL LOW (ref 3.5–5.0)
Anion gap: 16 — ABNORMAL HIGH (ref 5–15)
Anion gap: 16 — ABNORMAL HIGH (ref 5–15)
BUN: 93 mg/dL — ABNORMAL HIGH (ref 8–23)
BUN: 116 mg/dL — ABNORMAL HIGH (ref 8–23)
CO2: 22 mmol/L (ref 22–32)
CO2: 21 mmol/L — ABNORMAL LOW (ref 22–32)
Calcium: 8.5 mg/dL — ABNORMAL LOW (ref 8.9–10.3)
Calcium: 8.3 mg/dL — ABNORMAL LOW (ref 8.9–10.3)
Chloride: 95 mmol/L — ABNORMAL LOW (ref 98–111)
Chloride: 96 mmol/L — ABNORMAL LOW (ref 98–111)
Creatinine, Ser: 3.88 mg/dL — ABNORMAL HIGH (ref 0.61–1.24)
Creatinine, Ser: 4.78 mg/dL — ABNORMAL HIGH (ref 0.61–1.24)
GFR, Estimated: 15 mL/min — ABNORMAL LOW (ref >60)
GFR, Estimated: 12 mL/min — ABNORMAL LOW (ref >60)
Glucose, Bld: 160 mg/dL — ABNORMAL HIGH (ref 70–99)
Glucose, Bld: 100 mg/dL — ABNORMAL HIGH (ref 70–99)
Phosphorus: 5.9 mg/dL — ABNORMAL HIGH (ref 2.5–4.6)
Phosphorus: 5.8 mg/dL — ABNORMAL HIGH (ref 2.5–4.6)
Potassium: 5.4 mmol/L — ABNORMAL HIGH (ref 3.5–5.1)
Potassium: 5.3 mmol/L — ABNORMAL HIGH (ref 3.5–5.1)
Sodium: 133 mmol/L — ABNORMAL LOW (ref 135–145)
Sodium: 133 mmol/L — ABNORMAL LOW (ref 135–145)

## 2024-07-28 LAB — HEPARIN LEVEL (UNFRACTIONATED): Heparin Unfractionated: 0.65 [IU]/mL (ref 0.30–0.70)

## 2024-07-28 LAB — CBC
HCT: 23.5 % — ABNORMAL LOW (ref 39.0–52.0)
Hemoglobin: 7.6 g/dL — ABNORMAL LOW (ref 13.0–17.0)
MCH: 32.1 pg (ref 26.0–34.0)
MCHC: 32.3 g/dL (ref 30.0–36.0)
MCV: 99.2 fL (ref 80.0–100.0)
Platelets: 226 K/uL (ref 150–400)
RBC: 2.37 MIL/uL — ABNORMAL LOW (ref 4.22–5.81)
RDW: 18.1 % — ABNORMAL HIGH (ref 11.5–15.5)
WBC: 17.7 K/uL — ABNORMAL HIGH (ref 4.0–10.5)
nRBC: 0.2 % (ref 0.0–0.2)

## 2024-07-28 LAB — GLUCOSE, CAPILLARY
Glucose-Capillary: 104 mg/dL — ABNORMAL HIGH (ref 70–99)
Glucose-Capillary: 142 mg/dL — ABNORMAL HIGH (ref 70–99)
Glucose-Capillary: 148 mg/dL — ABNORMAL HIGH (ref 70–99)
Glucose-Capillary: 153 mg/dL — ABNORMAL HIGH (ref 70–99)
Glucose-Capillary: 157 mg/dL — ABNORMAL HIGH (ref 70–99)
Glucose-Capillary: 162 mg/dL — ABNORMAL HIGH (ref 70–99)
Glucose-Capillary: 94 mg/dL (ref 70–99)

## 2024-07-28 LAB — AMMONIA: Ammonia: 19 umol/L (ref 9–35)

## 2024-07-28 LAB — PROCALCITONIN: Procalcitonin: 0.65 ng/mL

## 2024-07-28 LAB — HEPATITIS B SURFACE ANTIBODY, QUANTITATIVE: Hep B S AB Quant (Post): <3.5 m[IU]/mL — ABNORMAL LOW

## 2024-07-28 LAB — MAGNESIUM: Magnesium: 2.5 mg/dL — ABNORMAL HIGH (ref 1.7–2.4)

## 2024-07-28 MED ORDER — SODIUM ZIRCONIUM CYCLOSILICATE 10 G PO PACK
10.0000 g | PACK | Freq: Once | ORAL | Status: AC
  Administered 2024-07-28: 10 g
  Filled 2024-07-28: qty 1

## 2024-07-28 MED ORDER — SERTRALINE HCL 50 MG PO TABS
50.0000 mg | ORAL_TABLET | Freq: Every day | ORAL | Status: DC
  Administered 2024-07-28 – 2024-07-30 (×3): 50 mg
  Filled 2024-07-28 (×4): qty 1

## 2024-07-28 NOTE — Progress Notes (Signed)
 NAME:  Lawrence [name], MRN:  [id number], DOB:  Jun 08, 1946, LOS: 11 ADMISSION DATE:  07/16/2024, CONSULTATION DATE: 07/17/2024 REFERRING MD:  Debby Carrier , CHIEF COMPLAINT: Status postcardiac arrest  History of Present Illness:  78 year old male with diabetes, hypertension, BPH status post TURP who presented 10/25 after having a ground-level fall with recent onset of numbness in his lower extremity- complete loss of sensation to BLE & urinary incontinence after fall.  Neurosurgery was consulted, patient taken to OR for partial anterior cervical discectomy C7-T1 but course was complicated with multiple time PEA cardiac arrest in OR, procedure was aborted, TEE was done which showed severe RV dysfunction and dilatation with moderate to severe LV dysfunction, patient was transferred to ICU for close monitoring.    In ICU patient blood pressure started dropping and he went into PEA cardiac arrest again, ROSC was achieved after 2 minutes.  CTA PE study with extensive pulmonary artery thrombus bilaterally with evidence of right heart strain. Unfortunately due to surgery, it was recommended no TNK/TPA. 10/27 taken for thrombectomy.   Pertinent  Medical History   Past Medical History:  Diagnosis Date   Arthritis    Diabetes mellitus without complication (HCC)    GERD (gastroesophageal reflux disease)    Hypertension    Sleep apnea     Significant Hospital Events: Including procedures, antibiotic start and stop dates in addition to other pertinent events   Admitted and PCCM consulted, coded multiple times in OR (PEA) 07/18/2024 mechanical thrombectomy was done for bilateral pulmonary embolism. 10/28 nephro consult 10/29 on 5 NE, plan for HD cath placement and CRRT  10/30 add midodrine  11/3 remain on CRRT, came off of vasopressors and inotropic support. 11/4 CRRT was stopped, not making much urine 11/5  Made 50 cc of urine, serum creatinine and potassium trended up  Interim History /  Subjective:  Received hemodialysis session overnight, tolerated well, 2 L of fluid was taken out Serum potassium remains at 5.6 Overnight he became aphasic probably because he was tired and did not want me to talk, [facility] was done which showed no acute abnormalities  Objective    Blood pressure (!) 119/51, pulse 66, temperature 97.7 F (36.5 C), temperature source Axillary, resp. rate 14, height 6' 1 (1.854 m), weight 75.2 kg, SpO2 100%. CVP:  [1 mmHg-4 mmHg] 1 mmHg      Intake/Output Summary (Last 24 hours) at 07/28/2024 0823 Last data filed at 07/28/2024 0700 Gross per 24 hour  Intake 1780.78 ml  Output 2110 ml  Net -329.22 ml   Filed Weights   07/27/24 1557 07/27/24 1936 07/28/24 0500  Weight: 78 kg 76 kg 75.2 kg    Examination: General: Elderly male, lying on the bed HEENT: [place]/AT, eyes anicteric.  moist mucus membranes, few ulcers noted on lips.  C-collar in place, anterior neck is swollen where ACDF incision was made Neuro: Alert, awake, following commands consistently, paraplegic Chest: Coarse breath sounds, no wheezes or rhonchi Heart: Regular rate and rhythm, no murmurs or gallops Abdomen: Soft, nontender, nondistended, bowel sounds present  Labs and images reviewed  Patient Lines/Drains/Airways Status     Active Line/Drains/Airways     Name Placement date Placement time Site Days   Arterial Line 07/17/24 Left Radial 07/17/24  1340  Radial  11   Peripheral IV 07/17/24 18 G Right Forearm 07/17/24  1227  Forearm  11   CVC Double Lumen 07/17/24 Right Internal jugular 07/17/24  --  -- 11   Hemodialysis Catheter Left  Femoral vein Triple lumen Temporary (Non-Tunneled) 07/20/24  1900  Femoral vein  8   Urethral Catheter Jamie Aydelette RN Latex 16 Fr. 07/17/24  1230  Latex  11   Small Bore Feeding Tube 10 Fr. Left nare Marking at nare/corner of mouth 69 cm 07/20/24  1055  Left nare  8   Wound 07/17/24 1413 Surgical Closed Surgical Incision Neck 07/17/24  1413  Neck  11    Wound 07/18/24 1420 Other (Comment) Thigh Anterior;Proximal;Right 07/18/24  1420  Thigh  10   Wound 07/23/24 1550 Pressure Injury Buttocks Left;Medial Stage 2 -  Partial thickness loss of dermis presenting as a shallow open injury with a red, pink wound bed without slough. 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 AKI due to ischemic ATN in setting of cardiac arrest Hyperkalemia/hypervolemic hyponatremia Shock liver, improving Anemia of critical illness Poorly controlled diabetes with hyperglycemia Dysphagia Situational depression  Continue supportive care Continue IV heparin  infusion for now until decision from neurosurgery if they would like to do neck surgery, will reach out today Echocardiogram showing grade 1 diastolic dysfunction and low normal RV function Patient is on room air Remain paraplegic with c-collar in place Made 110 cc of urine in last 24 hours, received hemodialysis session Will wait for renal recovery Currently he has temporary dialysis catheter in femoral vein, once cleared by neurosurgery, will place it in right IJ versus tunneled catheter as patient will likely require short-term hemodialysis to wait for renal recovery Will give him 1 dose of Lokelma considering serum potassium is 5.6 next LFTs are improving Monitor H&H Blood sugars are better controlled, continue Lantus 50 units twice daily, tube feed coverage and sliding scale with CBG goal 140-180 Patient does not have much  cough or gag, high risk of aspiration, he started spiking low-grade fever and white count is trending up though he just completed antibiotic therapy, will hold off on antibiotics Will send procalcitonin He is feeling depressed considering what is going on, will start Zoloft 50 mg daily per tube   Labs   CBC: Recent Labs  Lab 07/24/24 0732 07/25/24 0401 07/26/24 0354 07/27/24 0525 07/28/24 0152 07/28/24 0434  WBC 16.4* 15.9* 15.9* 16.0*  --  17.7*  HGB 7.8* 8.1* 8.2* 7.7* 7.8* 7.6*  HCT 23.8* 24.8* 25.6* 24.6* 23.0* 23.5*  MCV 96.0 96.9 98.5 100.4*  --  99.2  PLT 148* 186 197 214  --  226    Basic Metabolic Panel: Recent Labs  Lab 07/24/24 0445 07/24/24 1550 07/25/24 0401 07/25/24 1635 07/26/24 0354 07/26/24 1626 07/27/24 0525 07/27/24 1815 07/28/24 0152 07/28/24 0434  NA 134*   < > 134*   < > 135 135 134* 135 132* 133*  K 4.9   < > 4.6   < > 4.4 5.0 5.6* 3.3* 5.1 5.4*  CL 100   < > 98   < > 99 101 97* 98  --  95*  CO2 23   < > 23   < > 25 22 22 25   --  22  GLUCOSE 243*   < > 256*   < > 156* 189* 258* 110*  --  160*  BUN 49*   < > 47*   < > 50* 72* 105* 42*  --  93*  CREATININE 2.13*   < > 1.86*   < > 1.85* 2.56* 3.95* 1.77*  --  3.88*  CALCIUM 8.5*   < > 8.8*   < > 9.2 8.9 8.9 8.1*  --  8.5*  MG 2.1  --  2.0  --  2.3  --  2.6*  --   --  2.5*  PHOS 3.1  3.0   < > 3.1   < > 2.7 3.6 3.6 1.6*  --  5.9*   < > = values in this interval not displayed.   GFR: Estimated Creatinine Clearance: 17.2 mL/min (A) (by C-G formula based on SCr of 3.88 mg/dL (H)). Recent Labs  Lab 07/25/24 0401 07/26/24 0354 07/27/24 0525 07/28/24 0434  WBC 15.9* 15.9* 16.0* 17.7*    Liver Function Tests: Recent Labs  Lab 07/25/24 1635 07/26/24 0354 07/26/24 1626 07/27/24 0525 07/27/24 1815 07/28/24 0434  AST 31  --   --   --   --   --   ALT 128*  --   --   --   --   --   ALKPHOS 79  --   --   --   --   --   BILITOT 1.0  --   --   --   --   --   PROT 5.7*  --   --   --   --   --    ALBUMIN 2.1*  2.1* 2.2* 2.1* 2.3* 2.5* 2.3*   No results for input(s): LIPASE, AMYLASE in the last 168 hours.  Recent Labs  Lab 07/28/24 0155  AMMONIA 19    ABG    Component Value Date/Time   PHART 7.498 (H) 07/28/2024 0152   PCO2ART 31.0 (L) 07/28/2024 0152   PO2ART 87 07/28/2024 0152   HCO3 24.0 07/28/2024 0152   TCO2 25 07/28/2024 0152   ACIDBASEDEF 3.0 (H) 07/19/2024 0113   O2SAT 98 07/28/2024 0152     Coagulation Profile: No results for input(s): INR, PROTIME in the last 168 hours.   Cardiac Enzymes: No results for input(s): CKTOTAL, CKMB, CKMBINDEX, TROPONINI in the last 168 hours.   HbA1C: Hemoglobin A1C  Date/Time Value Ref Range Status  07/23/2021 12:00 AM 11.9  Final  10/27/2020 12:00 AM 9.3  Final   HbA1c, POC (controlled diabetic range)  Date/Time Value Ref Range Status  11/19/2021 11:44 AM 10.2 (A) 0.0 - 7.0 % Final   HbA1c POC (<> result, manual entry)  Date/Time Value Ref Range Status  03/18/2022 03:21 PM 10.8 4.0 - 5.6 % Final   Hgb A1c MFr Bld  Date/Time Value Ref Range Status  07/17/2024 04:25 AM 12.3 (H) 4.8 - 5.6 % Final    Comment:    (NOTE) Diagnosis of Diabetes The following HbA1c ranges recommended by the American Diabetes Association (ADA) may be used as an aid in the diagnosis of diabetes mellitus.  Hemoglobin             Suggested A1C NGSP%              Diagnosis  <5.7                   Non Diabetic  5.7-6.4  Pre-Diabetic  >6.4                   Diabetic  <7.0                   Glycemic control for                       adults with diabetes.    12/02/2022 11:30 AM 11.9 (H) 4.8 - 5.6 % Final    Comment:    (NOTE)         Prediabetes: 5.7 - 6.4         Diabetes: >6.4         Glycemic control for adults with diabetes: <7.0     CBG: Recent Labs  Lab 07/27/24 1517 07/27/24 1958 07/28/24 0003 07/28/24 0400 07/28/24 0726  GLUCAP 174* 125* 157* 162* 142*      Valinda Novas,  MD [facility] See Amion for pager If no response to pager, please call [phone] until 7pm After 7pm, Please call E-link [phone]

## 2024-07-28 NOTE — Progress Notes (Signed)
 Patient ID: Hurshell Dino, male   DOB: [birth year], 78 y.o.   MRN: [id number] S: No events overnight.  Tolerated HD well yesterday with UF of 2 liters.  K up this morning.  Family at bedside and discussed plan for ongoing IHD for now. O:BP (!) 119/51   Pulse 66   Temp 98.2 F (36.8 C) (Oral)   Resp 14   Ht 6' 1 (1.854 m)   Wt 75.2 kg   SpO2 100%   BMI 21.87 kg/m   Intake/Output Summary (Last 24 hours) at 07/28/2024 1259 Last data filed at 07/28/2024 0700 Gross per 24 hour  Intake 1472.78 ml  Output 2085 ml  Net -612.22 ml   Intake/Output: I/O last 3 completed shifts: In: 2901.2 [I.V.:441.2; NG/GT:2460] Out: 2160 [Urine:160; Other:2000]  Intake/Output this shift:  No intake/output data recorded. Weight change: 0 kg Gen: sitting in chair in NAD with c-collar CVS: RRR Resp: CTA Abd: +BS, soft, NT/ND Ext: no edema  Recent Labs  Lab 07/25/24 0401 07/25/24 1635 07/26/24 0354 07/26/24 1626 07/27/24 0525 07/27/24 1815 07/28/24 0152 07/28/24 0434  NA 134* 134* 135 135 134* 135 132* 133*  K 4.6 4.5 4.4 5.0 5.6* 3.3* 5.1 5.4*  CL 98 100 99 101 97* 98  --  95*  CO2 23 22 25 22 22 25   --  22  GLUCOSE 256* 266* 156* 189* 258* 110*  --  160*  BUN 47* 51* 50* 72* 105* 42*  --  93*  CREATININE 1.86* 1.81* 1.85* 2.56* 3.95* 1.77*  --  3.88*  ALBUMIN 2.0* 2.1*  2.1* 2.2* 2.1* 2.3* 2.5*  --  2.3*  CALCIUM 8.8* 9.0 9.2 8.9 8.9 8.1*  --  8.5*  PHOS 3.1 2.6 2.7 3.6 3.6 1.6*  --  5.9*  AST  --  31  --   --   --   --   --   --   ALT  --  128*  --   --   --   --   --   --    Liver Function Tests: Recent Labs  Lab 07/25/24 1635 07/26/24 0354 07/27/24 0525 07/27/24 1815 07/28/24 0434  AST 31  --   --   --   --   ALT 128*  --   --   --   --   ALKPHOS 79  --   --   --   --   BILITOT 1.0  --   --   --   --   PROT 5.7*  --   --   --   --   ALBUMIN 2.1*  2.1*   < > 2.3* 2.5* 2.3*   < > = values in this interval not displayed.   No results for input(s): LIPASE, AMYLASE in the  last 168 hours. Recent Labs  Lab 07/28/24 0155  AMMONIA 19   CBC: Recent Labs  Lab 07/24/24 0732 07/25/24 0401 07/26/24 0354 07/27/24 0525 07/28/24 0152 07/28/24 0434  WBC 16.4* 15.9* 15.9* 16.0*  --  17.7*  HGB 7.8* 8.1* 8.2* 7.7* 7.8* 7.6*  HCT 23.8* 24.8* 25.6* 24.6* 23.0* 23.5*  MCV 96.0 96.9 98.5 100.4*  --  99.2  PLT 148* 186 197 214  --  226   Cardiac Enzymes: No results for input(s): CKTOTAL, CKMB, CKMBINDEX, TROPONINI in the last 168 hours. CBG: Recent Labs  Lab 07/27/24 1958 07/28/24 0003 07/28/24 0400 07/28/24 0726 07/28/24 1142  GLUCAP 125* 157* 162* 142* 148*  Iron Studies: No results for input(s): IRON, TIBC, TRANSFERRIN, FERRITIN in the last 72 hours. Studies/Results: CT HEAD WO CONTRAST ( ) Result Date: 07/28/2024 EXAM: CT HEAD WITHOUT CONTRAST 07/28/2024 12:57:58 AM TECHNIQUE: CT of the head was performed without the administration of intravenous contrast. Automated exposure control, iterative reconstruction, and/or weight based adjustment of the mA/kV was utilized to reduce the radiation dose to as low as reasonably achievable. COMPARISON: CT head 07/17/2024 CLINICAL HISTORY: Mental status change, persistent or worsening. FINDINGS: BRAIN AND VENTRICLES: No acute hemorrhage. No evidence of acute infarct. No hydrocephalus. No extra-axial collection. No mass effect or midline shift. Similar cerebral atrophy. ORBITS: No acute abnormality. SINUSES: No acute abnormality. SOFT TISSUES AND SKULL: No acute soft tissue abnormality. No skull fracture. IMPRESSION: 1. No acute intracranial abnormality. Electronically signed by: Gilmore Molt MD 07/28/2024 01:19 AM EST RP Workstation: [id number]   CT CERVICAL SPINE WO CONTRAST Addendum Date: 07/26/2024 ADDENDUM REPORT: 07/26/2024 17:11 ADDENDUM: These results were called by telephone at the time of interpretation on 07/26/2024 at 3:27 p.m. to provider Leita Gleason, PA, who verbally acknowledged these  results. Electronically Signed   By: Toribio Agreste M.D.   On: 07/26/2024 17:11   Result Date: 07/26/2024 CLINICAL DATA:  Cervical trauma. Known left C7 transverse process fracture. EXAM: CT CERVICAL SPINE WITHOUT CONTRAST TECHNIQUE: Multidetector CT imaging of the cervical spine was performed without intravenous contrast. Multiplanar CT image reconstructions were also generated. RADIATION DOSE REDUCTION: This exam was performed according to the departmental dose-optimization program which includes automated exposure control, adjustment of the mA and/or kV according to patient size and/or use of iterative reconstruction technique. COMPARISON:  None Available. FINDINGS: Alignment: Normal. Skull base and vertebrae: Vertebral body heights are normal. There is moderate spondylosis throughout the cervical spine to include uncovertebral joint spurring and facet arthropathy. Evidence of patient's known displaced left C7 transverse process fracture. Right-sided neural foraminal narrowing at the C3-4 level and C4-5 levels. Minimal left-sided neural from narrowing at the C6-7 level. Soft tissues and spinal canal: Prevertebral soft tissues are normal. Mild narrowing of the AP diameter of the spinal canal the C5-6 level. Disc levels: Minimal disc space narrowing at multiple levels from the C4-5 level to the C6-7 level. Upper chest: Partially visualized right IJ central venous catheter as well as enteric tube. No acute findings over the visualized upper chest and lungs. Other: There is a new 3.5 x 5.8 cm slightly hyperdense collection over the left anterior neck at the level of the thyroid gland suggesting a contained hematoma. IMPRESSION: 1. Evidence of patient's known displaced left C7 transverse process fracture. 2. Moderate spondylosis throughout the cervical spine with minimal disc disease at multiple levels. Mild narrowing of the AP diameter of the spinal canal at the C5-6 level. 3. New 3.5 x 5.8 cm slightly hyperdense  collection over the left anterior neck at the level of the thyroid gland suggesting a contained hematoma. Currently paging provider. Electronically Signed: By: Toribio Agreste M.D. On: 07/26/2024 14:57    Chlorhexidine  Gluconate Cloth  6 each Topical Daily   Chlorhexidine  Gluconate Cloth  6 each Topical Q0600   insulin  aspart  0-20 Units Subcutaneous Q4H   insulin  aspart  8 Units Subcutaneous Q4H   insulin  glargine-yfgn  50 Units Subcutaneous BID   multivitamin  1 tablet Per Tube QHS   polyethylene glycol  17 g Per Tube BID   senna-docusate  2 tablet Per Tube QHS   sertraline  50 mg Per Tube Daily   sodium  chloride flush  3 mL Intravenous Q12H    BMET    Component Value Date/Time   NA 133 (L) 07/28/2024 0434   K 5.4 (H) 07/28/2024 0434   CL 95 (L) 07/28/2024 0434   CO2 22 07/28/2024 0434   GLUCOSE 160 (H) 07/28/2024 0434   BUN 93 (H) 07/28/2024 0434   BUN 18 02/21/2021 0000   CREATININE 3.88 (H) 07/28/2024 0434   CALCIUM 8.5 (L) 07/28/2024 0434   GFRNONAA 15 (L) 07/28/2024 0434   GFRAA 65 02/21/2021 0000   CBC    Component Value Date/Time   WBC 17.7 (H) 07/28/2024 0434   RBC 2.37 (L) 07/28/2024 0434   HGB 7.6 (L) 07/28/2024 0434   HCT 23.5 (L) 07/28/2024 0434   PLT 226 07/28/2024 0434   MCV 99.2 07/28/2024 0434   MCH 32.1 07/28/2024 0434   MCHC 32.3 07/28/2024 0434   RDW 18.1 (H) 07/28/2024 0434   LYMPHSABS 0.8 07/16/2024 1959   MONOABS 0.8 07/16/2024 1959   EOSABS 0.0 07/16/2024 1959   BASOSABS 0.0 07/16/2024 1959    Kase Shughart is an 78 y.o. male diabetes, hypertension, BPH status post TURP initially presenting after a ground-level fall about 5 days prior to admission. Of note over the past few weeks he has had neuropathy in his feet, felt more unsteady and used a cane because of his neuropathy.  Unfortunately he slipped and fell and hit his head on the sink followed by new onset numbness in his legs, inability to sit up and loss of function in his legs as well as  urinary incontinence.  He was to have a partial anterior cervical discectomy C7-T1 to stabilize even though he was not likely to regain strength in his lower extremities but during the operative course he developed PEA arrest x 2 with subsequent CT scan showing a massive bilateral PE with poor lung perfusion.  Patient also had successful thrombectomy performed by VIR removing a large clot burden at the right PA bifurcation as well as a large clot burden in the left lower lung lobe. BL cr is approximately 0.9-1.1 (12/02/22) but with his complicated course he has developed renal failure with very poor urine output.   Assessment/Plan:   Renal failure -secondary to ischemic ATN + CIN anuric at this current time with hypotension, PEA arrest x 2, CTA as well as successful thrombectomy. - He does not want long-term dialysis but is willing to give a trial with short-term dialysis to give his kidneys a chance to recover.  This is reasonable, however he is a poor long term HD candidate given poor functional and nutritional status.   -Initially anuric with K trending up --> CRRT on 10/29-11/4 -> still oliguric.   Likely will be a prolonged course given multiple, significant renal injuries, hopefully no cortical necrosis.    No e/o recovery; plan for ongoing iHD on MWF schedule. Eventually will need TC if no e/o recovery in short term, warned him it can take over a mth to recover given substantial renal insults. Family at bedside updated as well.   -Monitor Daily I/Os, Daily weight  -Maintain MAP>65 for optimal renal perfusion.  - Avoid nephrotoxic agents such as IV contrast, NSAIDs, and phosphate containing bowel preps (FLEETS)   Hyperkalemia - despite HD yesterday.  Possibly related to tube feeds with glucerna.  Would recommend nepro if possible.  Given Lokelma today and plan for HD tomorrow.  PE s/p at least 2 PEA arrests with ROSC was not a  candidate for lytics because of surgery status post successful  thrombectomy by VIR C7 fracture with paraplegia affecting the lower extremities which may not be reversible. Acute respiratory failure -off sedation and alert, and possible extubation trial pending weaning parameters Cardiogenic shock secondary to pulmonary embolus -off epinephrine, still on Levophed by cardiology Anemia: transfuse if Hb <7  Fairy RONAL Sellar, MD [facility]

## 2024-07-28 NOTE — Progress Notes (Signed)
 Inpatient Rehab Admissions Coordinator:   Note without signs of renal recovery and will plan HD MWF for the time being.  May need TDC. NS has no further plans for intervention given high risk for low benefit.  Fever curve looks better but still with uptrending leukocytosis.  Will continue to follow.   Reche Lowers, PT, DPT Admissions Coordinator [phone] 07/28/24 4:23 PM

## 2024-07-28 NOTE — Progress Notes (Signed)
 Subjective: No acute events  Objective: Vital signs in last 24 hours: Temp:  [97.2 F (36.2 C)-99.2 F (37.3 C)] 97.7 F (36.5 C) (11/06 0800) Pulse Rate:  [59-80] 66 (11/06 0700) Resp:  [9-25] 14 (11/06 0700) BP: (98-147)/(35-71) 119/51 (11/06 0700) SpO2:  [95 %-100 %] 100 % (11/06 0700) Arterial Line BP: (114-156)/(34-47) 156/42 (11/06 0700) Weight:  [75.2 kg-78 kg] 75.2 kg (11/06 0500)  Intake/Output from previous day: 11/05 0701 - 11/06 0700 In: 1857.8 [I.V.:297.8; NG/GT:1560] Out: 2110 [Urine:110] Intake/Output this shift: No intake/output data recorded.  No acute distress Deconditioned Dysphonia Left neck hematoma stable in size No movement in lower extremities Antigravity in proximal upper extremities  Lab Results: Recent Labs    07/27/24 0525 07/28/24 0152 07/28/24 0434  WBC 16.0*  --  17.7*  HGB 7.7* 7.8* 7.6*  HCT 24.6* 23.0* 23.5*  PLT 214  --  226   BMET Recent Labs    07/27/24 1815 07/28/24 0152 07/28/24 0434  NA 135 132* 133*  K 3.3* 5.1 5.4*  CL 98  --  95*  CO2 25  --  22  GLUCOSE 110*  --  160*  BUN 42*  --  93*  CREATININE 1.77*  --  3.88*  CALCIUM 8.1*  --  8.5*    Studies/Results: CT HEAD WO CONTRAST ( ) Result Date: 07/28/2024 EXAM: CT HEAD WITHOUT CONTRAST 07/28/2024 12:57:58 AM TECHNIQUE: CT of the head was performed without the administration of intravenous contrast. Automated exposure control, iterative reconstruction, and/or weight based adjustment of the mA/kV was utilized to reduce the radiation dose to as low as reasonably achievable. COMPARISON: CT head 07/17/2024 CLINICAL HISTORY: Mental status change, persistent or worsening. FINDINGS: BRAIN AND VENTRICLES: No acute hemorrhage. No evidence of acute infarct. No hydrocephalus. No extra-axial collection. No mass effect or midline shift. Similar cerebral atrophy. ORBITS: No acute abnormality. SINUSES: No acute abnormality. SOFT TISSUES AND SKULL: No acute soft tissue  abnormality. No skull fracture. IMPRESSION: 1. No acute intracranial abnormality. Electronically signed by: Gilmore Molt MD 07/28/2024 01:19 AM EST RP Workstation: [id number]   CT CERVICAL SPINE WO CONTRAST Addendum Date: 07/26/2024 ADDENDUM REPORT: 07/26/2024 17:11 ADDENDUM: These results were called by telephone at the time of interpretation on 07/26/2024 at 3:27 p.m. to provider Leita Gleason, PA, who verbally acknowledged these results. Electronically Signed   By: Toribio Agreste M.D.   On: 07/26/2024 17:11   Result Date: 07/26/2024 CLINICAL DATA:  Cervical trauma. Known left C7 transverse process fracture. EXAM: CT CERVICAL SPINE WITHOUT CONTRAST TECHNIQUE: Multidetector CT imaging of the cervical spine was performed without intravenous contrast. Multiplanar CT image reconstructions were also generated. RADIATION DOSE REDUCTION: This exam was performed according to the departmental dose-optimization program which includes automated exposure control, adjustment of the mA and/or kV according to patient size and/or use of iterative reconstruction technique. COMPARISON:  None Available. FINDINGS: Alignment: Normal. Skull base and vertebrae: Vertebral body heights are normal. There is moderate spondylosis throughout the cervical spine to include uncovertebral joint spurring and facet arthropathy. Evidence of patient's known displaced left C7 transverse process fracture. Right-sided neural foraminal narrowing at the C3-4 level and C4-5 levels. Minimal left-sided neural from narrowing at the C6-7 level. Soft tissues and spinal canal: Prevertebral soft tissues are normal. Mild narrowing of the AP diameter of the spinal canal the C5-6 level. Disc levels: Minimal disc space narrowing at multiple levels from the C4-5 level to the C6-7 level. Upper chest: Partially visualized right IJ central venous catheter as well  as enteric tube. No acute findings over the visualized upper chest and lungs. Other: There is a new 3.5  x 5.8 cm slightly hyperdense collection over the left anterior neck at the level of the thyroid gland suggesting a contained hematoma. IMPRESSION: 1. Evidence of patient's known displaced left C7 transverse process fracture. 2. Moderate spondylosis throughout the cervical spine with minimal disc disease at multiple levels. Mild narrowing of the AP diameter of the spinal canal at the C5-6 level. 3. New 3.5 x 5.8 cm slightly hyperdense collection over the left anterior neck at the level of the thyroid gland suggesting a contained hematoma. Currently paging provider. Electronically Signed: By: Toribio Agreste M.D. 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 - As his neck hematoma is stable and he is not having airway issues, anticoagulation is continuing.   - I had a long discussion with the patient's daughter.  Based on his CT C-spine recently performed, he does not have gross mechanical instability of his spine.  At this point, completing his C7-T1 ACDF would provide minimal neurologic benefit.  With his neck swelling, kidney failure, need for anticoagulation,  and recent PEA arrest, having him undergo further neck surgery would have high risk of mortality.  As such, I do not recommend further cervical spine surgery and I recommend continued supportive care and continuing with cervical collar for 6 weeks.  All questions and concerns were answered.  Dorn KANDICE Ned 07/28/2024, 11:13 AM

## 2024-07-28 NOTE — Progress Notes (Signed)
 PHARMACY - ANTICOAGULATION CONSULT NOTE  Pharmacy Consult for heparin  Indication: pulmonary embolus  Allergies  Allergen Reactions   Penicillins Hives   Lucentis [Ranibizumab] Rash    Patient Measurements: Height: 6' 1 (185.4 cm) Weight: 75.2 kg (165 lb 12.6 oz) IBW/kg (Calculated) : 79.9 HEPARIN  DW (KG): 81.8  Vital Signs: Temp: 97.7 F (36.5 C) (11/06 0800) Temp Source: Axillary (11/06 0800) BP: 119/51 (11/06 0700) Pulse Rate: 66 (11/06 0700)  Labs: Recent Labs    07/25/24 1635 07/26/24 0340 07/26/24 0354 07/26/24 1626 07/27/24 0525 07/27/24 1815 07/28/24 0152 07/28/24 0434  HGB   < >  --  8.2*  --  7.7*  --  7.8* 7.6*  HCT   < >  --  25.6*  --  24.6*  --  23.0* 23.5*  PLT  --   --  197  --  214  --   --  226  HEPARINUNFRC  --  0.48  --   --  0.49  --   --  0.65  CREATININE  --   --  1.85*   < > 3.95* 1.77*  --  3.88*   < > = values in this interval not displayed.    Estimated Creatinine Clearance: 17.2 mL/min (A) (by C-G formula based on SCr of 3.88 mg/dL (H)).   Assessment: JC is a 36 YOM admitted with concern for spinal cord injury. Patient taken to the OR for neurosurgery, procedure complicated by multiple rounds of CPR.   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 S/p IR 10/27 for thrombectomy.  Heparin  level 0.65 is therapeutic with heparin  running at 1200 units/hr.  Hgb stable but slight downtrend to 7.6.Platelets are within normal limits.  No bleeding noted    Goal of Therapy:  Heparin  level 0.3-0.7 units/ml Monitor platelets by anticoagulation protocol: Yes   Plan:  Continue heparin  infusion at 1200 units/hr Monitor daily heparin  level, CBC, and signs/symptoms of bleeding F/u plans for transition to oral AC when procedures complete  Powell Blush, PharmD, BCCCP  Clinical Pharmacist Please refer to [facility] for [facility] numbers 07/28/2024 9:04 AM

## 2024-07-28 NOTE — Progress Notes (Signed)
 Speech Language Pathology Treatment: Cognitive-Linguistic  Patient Details Name: Lawrence [name] MRN: [id number] DOB: 11/17/1945 Today's Date: 07/28/2024 Time: 8371-8359 SLP Time Calculation (min) (ACUTE ONLY): 12 min  Assessment / Plan / Recommendation Clinical Impression  Patient seen by SLP for for skilled treatment focused on cognitive-linguistic and speech goals. Prior to entering room, SLP spoke with patient's RN who reported that patient demonstrated ability to speak in a loud, clear voice earlier today. Patient was awake in room, spouse and son were both present. Per son, he had recently been given some pain medication. Despite moderate level of cues and encouragement, patient only able to achieve a very low intensity voice with responses limited to one-word. In addition, voice sounded wet (suspect secretions).  He was not able to exhibit a throat clear or cough even with modeling and maximal cues. SLP spent portion of session discussing recommendations for the time being including promoting wakefulness during the day and sleep at night, interacting with patient and encouraging him to verbally respond and interact as well. SLP explained plan for repeat swallow evaluation when patient is more consistently alert and interactive and do not anticipate him being ready for this until at least next week. Family in agreement with plan. SLP will continue to follow.  HPI        SLP Plan  Continue with current plan of care          Recommendations  Diet recommendations: NPO Medication Administration: Via alternative means                  Oral care QID   Frequent or constant Supervision/Assistance Cognitive communication deficit (M58.158)     Continue with current plan of care    Norleen IVAR Blase, MA, CCC-SLP Speech Therapy

## 2024-07-28 NOTE — Progress Notes (Signed)
   07/28/24 1855  Vitals  Temp 98 F (36.7 C)  Temp Source Oral  BP (!) 116/49  MAP (mmHg) 66  BP Location Right Arm  BP Method Automatic  Patient Position (if appropriate) Lying  Pulse Rate 70  Pulse Rate Source Monitor  ECG Heart Rate 70  Resp 18  Level of Consciousness  Level of Consciousness Responds to Voice  MEWS COLOR  MEWS Score Color Green  Oxygen Therapy  SpO2 96 %  O2 Device Room Air  MEWS Score  MEWS Temp 0  MEWS Systolic 0  MEWS Pulse 0  MEWS RR 0  MEWS LOC 1  MEWS Score 1   Pt transferred to unit from 4N ICU with all personal belongings. Pt A&Ox2, moves bilateral upper extremities without resistance. Pt responds to voice. Skin assessed with second RN per hospital policy with an existing pressure injury to sacrum. Surgical incision to anterior neck with honeycomb drg c/d/I and swollen. C-Collar on and aligned. Pt in bed with alarm on, call bell within reach, and bed in lowest position. Pt and pt's family oriented to unit, room, and call light system. Pt's wife and son at bedside. Report given to oncoming shift.

## 2024-07-28 NOTE — Progress Notes (Addendum)
 Cross covering ICU physician  Called to bedside to assess pt in regards to his new non-verbal state. Pt awakens but is quite drowsy. He does not vocalize but seemingly nods to simple questions. He is not consistently following commands, but unsure of baseline.   Pupils are equal and reactive and all vss. Pt is protecting airway.   Have requested that bedside RN notify the surgical team should they have any recommendations   I will check ammonia. Abg and if correctable cause will provide therapy. BUN is corrected post dialysis today from 105 and this could be sequelae, lingering effect vs just sheer exhaustion.   However as pt is on heparin  and full neuro exam difficult considering his paraplegia and drowsy state will send for cth out of an over abundance of caution.   Updated bedside RN and family at bedside.

## 2024-07-29 DIAGNOSIS — R570 Cardiogenic shock: Secondary | ICD-10-CM

## 2024-07-29 DIAGNOSIS — N179 Acute kidney failure, unspecified: Secondary | ICD-10-CM | POA: Diagnosis not present

## 2024-07-29 DIAGNOSIS — E1165 Type 2 diabetes mellitus with hyperglycemia: Secondary | ICD-10-CM | POA: Diagnosis not present

## 2024-07-29 DIAGNOSIS — Z794 Long term (current) use of insulin: Secondary | ICD-10-CM

## 2024-07-29 DIAGNOSIS — G8220 Paraplegia, unspecified: Secondary | ICD-10-CM | POA: Diagnosis not present

## 2024-07-29 DIAGNOSIS — I2699 Other pulmonary embolism without acute cor pulmonale: Secondary | ICD-10-CM

## 2024-07-29 DIAGNOSIS — S14107A Unspecified injury at C7 level of cervical spinal cord, initial encounter: Secondary | ICD-10-CM | POA: Diagnosis not present

## 2024-07-29 LAB — CBC WITH DIFFERENTIAL/PLATELET
Abs Immature Granulocytes: 0.28 K/uL — ABNORMAL HIGH (ref 0.00–0.07)
Basophils Absolute: 0 K/uL (ref 0.0–0.1)
Basophils Relative: 0 %
Eosinophils Absolute: 0 K/uL (ref 0.0–0.5)
Eosinophils Relative: 0 %
HCT: 26.7 % — ABNORMAL LOW (ref 39.0–52.0)
Hemoglobin: 8.7 g/dL — ABNORMAL LOW (ref 13.0–17.0)
Immature Granulocytes: 1 %
Lymphocytes Relative: 1 %
Lymphs Abs: 0.2 K/uL — ABNORMAL LOW (ref 0.7–4.0)
MCH: 31.8 pg (ref 26.0–34.0)
MCHC: 32.6 g/dL (ref 30.0–36.0)
MCV: 97.4 fL (ref 80.0–100.0)
Monocytes Absolute: 1.3 K/uL — ABNORMAL HIGH (ref 0.1–1.0)
Monocytes Relative: 6 %
Neutro Abs: 21.7 K/uL — ABNORMAL HIGH (ref 1.7–7.7)
Neutrophils Relative %: 92 %
Platelets: 252 K/uL (ref 150–400)
RBC: 2.74 MIL/uL — ABNORMAL LOW (ref 4.22–5.81)
RDW: 18.9 % — ABNORMAL HIGH (ref 11.5–15.5)
WBC: 23.5 K/uL — ABNORMAL HIGH (ref 4.0–10.5)
nRBC: 0.1 % (ref 0.0–0.2)

## 2024-07-29 LAB — CBC
HCT: 25.5 % — ABNORMAL LOW (ref 39.0–52.0)
Hemoglobin: 8.1 g/dL — ABNORMAL LOW (ref 13.0–17.0)
MCH: 31.4 pg (ref 26.0–34.0)
MCHC: 31.8 g/dL (ref 30.0–36.0)
MCV: 98.8 fL (ref 80.0–100.0)
Platelets: 234 K/uL (ref 150–400)
RBC: 2.58 MIL/uL — ABNORMAL LOW (ref 4.22–5.81)
RDW: 18.3 % — ABNORMAL HIGH (ref 11.5–15.5)
WBC: 23.3 K/uL — ABNORMAL HIGH (ref 4.0–10.5)
nRBC: 0.1 % (ref 0.0–0.2)

## 2024-07-29 LAB — RENAL FUNCTION PANEL
Albumin: 2.4 g/dL — ABNORMAL LOW (ref 3.5–5.0)
Albumin: 2.3 g/dL — ABNORMAL LOW (ref 3.5–5.0)
Anion gap: 17 — ABNORMAL HIGH (ref 5–15)
Anion gap: 17 — ABNORMAL HIGH (ref 5–15)
BUN: 143 mg/dL — ABNORMAL HIGH (ref 8–23)
BUN: 81 mg/dL — ABNORMAL HIGH (ref 8–23)
CO2: 20 mmol/L — ABNORMAL LOW (ref 22–32)
CO2: 21 mmol/L — ABNORMAL LOW (ref 22–32)
Calcium: 8.6 mg/dL — ABNORMAL LOW (ref 8.9–10.3)
Calcium: 8.2 mg/dL — ABNORMAL LOW (ref 8.9–10.3)
Chloride: 95 mmol/L — ABNORMAL LOW (ref 98–111)
Chloride: 95 mmol/L — ABNORMAL LOW (ref 98–111)
Creatinine, Ser: 5.07 mg/dL — ABNORMAL HIGH (ref 0.61–1.24)
Creatinine, Ser: 3.66 mg/dL — ABNORMAL HIGH (ref 0.61–1.24)
GFR, Estimated: 11 mL/min — ABNORMAL LOW (ref >60)
GFR, Estimated: 16 mL/min — ABNORMAL LOW (ref >60)
Glucose, Bld: 156 mg/dL — ABNORMAL HIGH (ref 70–99)
Glucose, Bld: 216 mg/dL — ABNORMAL HIGH (ref 70–99)
Phosphorus: 6.8 mg/dL — ABNORMAL HIGH (ref 2.5–4.6)
Phosphorus: 5.9 mg/dL — ABNORMAL HIGH (ref 2.5–4.6)
Potassium: 6.4 mmol/L (ref 3.5–5.1)
Potassium: 5.7 mmol/L — ABNORMAL HIGH (ref 3.5–5.1)
Sodium: 132 mmol/L — ABNORMAL LOW (ref 135–145)
Sodium: 133 mmol/L — ABNORMAL LOW (ref 135–145)

## 2024-07-29 LAB — BASIC METABOLIC PANEL WITH GFR
Anion gap: 18 — ABNORMAL HIGH (ref 5–15)
BUN: 148 mg/dL — ABNORMAL HIGH (ref 8–23)
CO2: 22 mmol/L (ref 22–32)
Calcium: 8.6 mg/dL — ABNORMAL LOW (ref 8.9–10.3)
Chloride: 93 mmol/L — ABNORMAL LOW (ref 98–111)
Creatinine, Ser: 5.33 mg/dL — ABNORMAL HIGH (ref 0.61–1.24)
GFR, Estimated: 10 mL/min — ABNORMAL LOW (ref >60)
Glucose, Bld: 166 mg/dL — ABNORMAL HIGH (ref 70–99)
Potassium: 6.1 mmol/L — ABNORMAL HIGH (ref 3.5–5.1)
Sodium: 133 mmol/L — ABNORMAL LOW (ref 135–145)

## 2024-07-29 LAB — GLUCOSE, CAPILLARY
Glucose-Capillary: 143 mg/dL — ABNORMAL HIGH (ref 70–99)
Glucose-Capillary: 154 mg/dL — ABNORMAL HIGH (ref 70–99)
Glucose-Capillary: 190 mg/dL — ABNORMAL HIGH (ref 70–99)
Glucose-Capillary: 211 mg/dL — ABNORMAL HIGH (ref 70–99)
Glucose-Capillary: 155 mg/dL — ABNORMAL HIGH (ref 70–99)

## 2024-07-29 LAB — POTASSIUM
Potassium: 5.5 mmol/L — ABNORMAL HIGH (ref 3.5–5.1)
Potassium: 5.7 mmol/L — ABNORMAL HIGH (ref 3.5–5.1)
Potassium: 5.9 mmol/L — ABNORMAL HIGH (ref 3.5–5.1)

## 2024-07-29 LAB — HEPARIN LEVEL (UNFRACTIONATED): Heparin Unfractionated: 0.51 [IU]/mL (ref 0.30–0.70)

## 2024-07-29 LAB — MAGNESIUM: Magnesium: 2.7 mg/dL — ABNORMAL HIGH (ref 1.7–2.4)

## 2024-07-29 MED ORDER — SODIUM BICARBONATE 8.4 % IV SOLN
50.0000 meq | Freq: Once | INTRAVENOUS | Status: AC
  Administered 2024-07-29: 50 meq via INTRAVENOUS
  Filled 2024-07-29: qty 50

## 2024-07-29 MED ORDER — DEXTROSE 50 % IV SOLN
1.0000 | Freq: Once | INTRAVENOUS | Status: AC
  Administered 2024-07-29: 50 mL via INTRAVENOUS
  Filled 2024-07-29: qty 50

## 2024-07-29 MED ORDER — SODIUM ZIRCONIUM CYCLOSILICATE 10 G PO PACK
10.0000 g | PACK | Freq: Once | ORAL | Status: AC
  Administered 2024-07-29: 10 g via ORAL
  Filled 2024-07-29: qty 1

## 2024-07-29 MED ORDER — CHLORHEXIDINE GLUCONATE CLOTH 2 % EX PADS
6.0000 | MEDICATED_PAD | Freq: Every day | CUTANEOUS | Status: DC
  Administered 2024-07-30 – 2024-08-01 (×3): 6 via TOPICAL

## 2024-07-29 MED ORDER — CALCIUM GLUCONATE-NACL 1-0.675 GM/50ML-% IV SOLN
1.0000 g | Freq: Once | INTRAVENOUS | Status: AC
Start: 2024-07-29 — End: 2024-07-29
  Administered 2024-07-29: 1000 mg via INTRAVENOUS
  Filled 2024-07-29: qty 50

## 2024-07-29 MED ORDER — HEPARIN SODIUM (PORCINE) 1000 UNIT/ML IJ SOLN
INTRAMUSCULAR | Status: AC
  Filled 2024-07-29: qty 3

## 2024-07-29 MED ORDER — NEPRO/CARBSTEADY PO LIQD
1000.0000 mL | ORAL | Status: DC
  Administered 2024-07-29 – 2024-08-01 (×2): 1000 mL
  Filled 2024-07-29 (×3): qty 1000

## 2024-07-29 MED ORDER — ALBUTEROL SULFATE (2.5 MG/3ML) 0.083% IN NEBU
10.0000 mg | INHALATION_SOLUTION | Freq: Once | RESPIRATORY_TRACT | Status: AC
  Administered 2024-07-29: 10 mg via RESPIRATORY_TRACT
  Filled 2024-07-29: qty 12

## 2024-07-29 MED ORDER — GUAIFENESIN 100 MG/5ML PO LIQD
5.0000 mL | Freq: Four times a day (QID) | ORAL | Status: DC
  Administered 2024-07-29 – 2024-07-31 (×6): 5 mL via ORAL
  Filled 2024-07-29 (×6): qty 10

## 2024-07-29 MED ORDER — INSULIN ASPART 100 UNIT/ML IV SOLN
10.0000 [IU] | Freq: Once | INTRAVENOUS | Status: AC
  Administered 2024-07-29: 10 [IU] via INTRAVENOUS
  Filled 2024-07-29: qty 10

## 2024-07-29 NOTE — Progress Notes (Signed)
 Date and time results received: 07/29/24 0415 (use smartphrase .now to insert current time)  Test: K Critical Value: 6.4  Name of Provider Notified: E-Link  Orders Received? Or Actions Taken?: see new orders

## 2024-07-29 NOTE — Progress Notes (Signed)
 PROGRESS NOTE    Lawrence [name]  FMW:[id number] DOB: [birth year] DOA: 07/16/2024 PCP: Viviana Candyce HERO, MD   Brief Narrative: Lawrence [name] is a 78 y.o. male with a history of hypertension, diabetes, BPH.  Patient presented secondary to fall after lower extremity weakness with complete loss of sensation in with associated urinary incontinence.  Imaging on admission significant for traumatic cord injury with cord contusion and edema and acute fracture of C7-T1.  Neurosurgery took patient to the OR for anterior cervical discectomy of C7-T1, however this was aborted secondary to recurrent PEA cardiac arrest.  Patient found to have severe right ventricle dysfunction in addition to moderate to severe left ventricular dysfunction and transferred to ICU.  Patient with recurrent PEA arrest while in ICU with subsequent CTA chest significant for bilateral PE.  TNK/tPA deferred secondary to recent neurosurgical procedure and instead patient underwent mechanical thrombectomy.  During hospitalization, patient's course was complicated by development of anuric AKI requiring CRRT with transition to intermittent hemodialysis.   Assessment and Plan:  Bilateral pulmonary embolism Noted on CTA chest after patient's suffered multiple PEA arrests. Patient was not a candidate for TNK/TPA secondary to neurosurgical surgery. IR was consulted and performed a mechanical thrombectomy on 10/27. Anticoagulation held at this time.  S/p PEA cardiac arrest Secondary to bilateral PE and resultant associated cardiogenic shock.  Cardiogenic shock Acute RV failure Secondary to bilateral PE. Cardiology consulted for consideration of ECMO. Per cardiology, patient not a candidate.  Acute respiratory failure with hypoxia Secondary to cardiac arrest requiring intubation and mechanical ventilation.  AKI Secondary to ischemic ATN and CIN. Anuric. A left femoral vein hemodialysis catheter was placed on 10/29 and patient started on CRRT  while in ICU. Patient transitioned to intermittent hemodialysis on 11/5. -Nephrology recommendations: Continue hemodialysis  Hyperkalemia Secondary to renal impairment. Management with hemodialysis and Lokelma. -Continue telemetry  Rib fractures Secondary to chest compressions during CPR -Continue supportive care  C7/T1 spinal cord injury Displaced C7 vertebral fracture Patient evaluated by neurosurgery who attempted to performed a partial anterior cervical discectomy of C7-T1 on 10/26, which was aborted.  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 -Continue therapy  Leukocytosis Initially secondary to pneumonia, which was treated. Leukocytosis acutely worsening and is up to 23,500 today. Procalcitonin elevated, but may not be accurate secondary to renal failure. Last fever of 101.9 F on 11/5. Antibiotics not restarted. -Trend CBC  Shock liver Related to cardiogenic shock. AST/ALT as high as 1,476/978 respectively. Resolved.  Bilateral multifocal pneumonia Patient completed antibiotic therapy with Vancomycin, Cefepime and Ceftriaxone.  Anemia of critical illness Acute blood loss anemia Baseline hemoglobin of about 14 on admission. Patient with acute drop in hemoglobin presumed related to CRRT, in addition to overall critical illness.  Diabetes mellitus type 2 Poorly controlled with hyperglycemia based on hemoglobin A1C of 12.3%. Patient is managed on Jardiance and Tresiba  as an outpatient. -Continue Semglee -Continue Novolog  TID with meals -Continue SSI  Dysphagia Complicated by critical illness and recent intubation, in addition to cognitive impairments. Cortrak feeding tube placed and tube feeds started. Speech therapy continues to recommend NPO at this time.  Situational depression Patient started on Zoloft this admission. -Continue Zoloft  Pressure injury Left buttocks. Unclear if present on admission.   DVT prophylaxis: SCDs Code Status:   Code Status: Full  Code Family Communication: Patient seen during hemodialysis. Wife via telephone, however no response. Disposition Plan: Discharge pending ongoing specialist recommendations, decision on nutrition intake, stable leukocytosis, ongoing therapy recommendations   Consultants:  Neurosurgery [facility] Cardiology Interventional radiology  Procedures:  CPR Intubation/extubation Cortrak feeding tube placement Partial anterior cervical discectomy, C7-T1, aborted  Antimicrobials: Ceftriaxone Cefepime Vancomycin    Subjective: Patient reports no specific concerns.  Objective: BP (!) 131/44 (BP Location: Right Arm)   Pulse 71   Temp 98.7 F (37.1 C) (Oral)   Resp 18   Ht 6' 1 (1.854 m)   Wt 67.1 kg   SpO2 97%   BMI 19.52 kg/m   Examination:  General exam: Appears calm and comfortable. Currently receiving hemodialysis. Respiratory system: Rhonchi on auscultation. Cardiovascular system: S1 & S2 heard, RRR. Gastrointestinal system: Abdomen is nondistended, soft and nontender. Normal bowel sounds heard. Central nervous system: Alert and oriented. No focal neurological deficits. Musculoskeletal: No edema. No calf tenderness   Data Reviewed: I have personally reviewed following labs and imaging studies  CBC Lab Results  Component Value Date   WBC 23.3 (H) 07/29/2024   RBC 2.58 (L) 07/29/2024   HGB 8.1 (L) 07/29/2024   HCT 25.5 (L) 07/29/2024   MCV 98.8 07/29/2024   MCH 31.4 07/29/2024   PLT 234 07/29/2024   MCHC 31.8 07/29/2024   RDW 18.3 (H) 07/29/2024   LYMPHSABS 0.8 07/16/2024   MONOABS 0.8 07/16/2024   EOSABS 0.0 07/16/2024   BASOSABS 0.0 07/16/2024     Last metabolic panel Lab Results  Component Value Date   NA 132 (L) 07/29/2024   K 6.4 (HH) 07/29/2024   CL 95 (L) 07/29/2024   CO2 20 (L) 07/29/2024   BUN 143 (H) 07/29/2024   CREATININE 5.07 (H) 07/29/2024   GLUCOSE 156 (H) 07/29/2024   GFRNONAA 11 (L) 07/29/2024   GFRAA 65 02/21/2021   CALCIUM 8.6 (L)  07/29/2024   PHOS 6.8 (H) 07/29/2024   PROT 5.7 (L) 07/25/2024   ALBUMIN 2.4 (L) 07/29/2024   BILITOT 1.0 07/25/2024   ALKPHOS 79 07/25/2024   AST 31 07/25/2024   ALT 128 (H) 07/25/2024   ANIONGAP 17 (H) 07/29/2024    GFR: Estimated Creatinine Clearance: 11.8 mL/min (A) (by C-G formula based on SCr of 5.07 mg/dL (H)).  No results found for this or any previous visit (from the past 240 hours).    Radiology Studies: CT HEAD WO CONTRAST ( ) Result Date: 07/28/2024 EXAM: CT HEAD WITHOUT CONTRAST 07/28/2024 12:57:58 AM TECHNIQUE: CT of the head was performed without the administration of intravenous contrast. Automated exposure control, iterative reconstruction, and/or weight based adjustment of the mA/kV was utilized to reduce the radiation dose to as low as reasonably achievable. COMPARISON: CT head 07/17/2024 CLINICAL HISTORY: Mental status change, persistent or worsening. FINDINGS: BRAIN AND VENTRICLES: No acute hemorrhage. No evidence of acute infarct. No hydrocephalus. No extra-axial collection. No mass effect or midline shift. Similar cerebral atrophy. ORBITS: No acute abnormality. SINUSES: No acute abnormality. SOFT TISSUES AND SKULL: No acute soft tissue abnormality. No skull fracture. IMPRESSION: 1. No acute intracranial abnormality. Electronically signed by: Gilmore Molt MD 07/28/2024 01:19 AM EST RP Workstation: [id number]      LOS: 12 days    Elgin Lam, MD [facility] 07/29/2024, 7:15 AM   If 7PM-7AM, please contact night-coverage [email or web address]

## 2024-07-29 NOTE — Progress Notes (Signed)
   07/29/24 1244  Vitals  Temp 98.8 F (37.1 C)  Temp Source Oral  BP (!) 124/43  MAP (mmHg) (!) 63  BP Location Right Arm  BP Method Automatic  Patient Position (if appropriate) Lying  Pulse Rate 83  Pulse Rate Source Monitor  ECG Heart Rate 84  Resp 16  Level of Consciousness  Level of Consciousness Responds to Voice  MEWS COLOR  MEWS Score Color Green  Oxygen Therapy  SpO2 97 %  O2 Device Room Air  MEWS Score  MEWS Temp 0  MEWS Systolic 0  MEWS Pulse 0  MEWS RR 0  MEWS LOC 1  MEWS Score 1   Pt return to unit from HD. Pt's daughter at bedside.

## 2024-07-29 NOTE — Progress Notes (Signed)
 Pt off unit to HD. Pt's family at bedside.

## 2024-07-29 NOTE — Hospital Course (Addendum)
 Patient's chart was being reviewed this AM and Palliative Care Consultation was placed this AM. Was on the way to see and evaluate the patient but notified by the PCCM Physician Dr. Claudene that the patient rapidly decompensated approximately 15 minutes prior and had another PEA arrest/Asystole. PCCM Dr. Claudene was called to and at the bedside when this occurred and he received CPR and high quality chest compressions and intubated. The patient Subsequently achieved ROSC and was transferred to the ICU. The Hospitalist team was notified about the decompensation and PEA arrest after ROSC was achieved and after he was stabilized and placed on a Propofol  gtt. Abx were escalated from IV Unasyn to IV Meropenem and IV Vancomycin. Current plan is to place the patient on CRRT and Nephrology provider has been notified as well. At this time TRH will sign off the case as he remains intubated and will resume primary management of this patient once he is stable enough to be extubated and transferred out of the ICU.

## 2024-07-29 NOTE — Progress Notes (Signed)
 eLink Physician-Brief Progress Note Patient Name: Arick Mareno DOB: Dec 30, 1945 MRN: [id number]   Date of Service  07/29/2024  HPI/Events of Note  K6.4, uptrending along with creatinine and BUN.  Consistent with ongoing renal failure  eICU Interventions  Hyperkalemia protocol in place, nephrology following     Intervention Category Intermediate Interventions: Electrolyte abnormality - evaluation and management  Siriyah Ambrosius 07/29/2024, 5:36 AM

## 2024-07-29 NOTE — Progress Notes (Signed)
 PT Cancellation Note  Patient Details Name: Lawrence [name] MRN: [id number] DOB: May 13, 1946   Cancelled Treatment:    Reason Eval/Treat Not Completed: Patient at procedure or test/unavailable - at HD  Johana RAMAN, PT DPT Acute Rehabilitation Services Secure Chat Preferred  Office [phone]    Puneet Masoner FORBES [place] 07/29/2024, 8:55 AM

## 2024-07-29 NOTE — Progress Notes (Signed)
 Notified MD Coladonato concerning pt K and medications given to treat. Ordered to receive 1K 2.5Ca bath for one hour followed by 2K 2.5Ca bath for remainder of treatment.

## 2024-07-29 NOTE — Procedures (Signed)
 I was present at this dialysis session. I have reviewed the session and made appropriate changes.   HD as an AKI with poor prognosis after spinal cord injury.  Unclear how much neurological and renal recovery to expect.    High K pre HD, started on 1K and will transition to 2K bath.  Using [facility].  Hb stable at 8.1.  Also sig azotemia proba from enteral nurtrition + low GFR.    UF goal of 2L.  Pt w/o c/o or needs.  Might req HD again tomorrow, will review labs in AM to decide.    Filed Weights   07/28/24 0500 07/29/24 0500 07/29/24 0814  Weight: 75.2 kg 67.1 kg 67.1 kg    Recent Labs  Lab 07/29/24 0231 07/29/24 0659  NA 132* 133*  K 6.4* 6.1*  CL 95* 93*  CO2 20* 22  GLUCOSE 156* 166*  BUN 143* 148*  CREATININE 5.07* 5.33*  CALCIUM 8.6* 8.6*  PHOS 6.8*  --     Recent Labs  Lab 07/27/24 0525 07/28/24 0152 07/28/24 0434 07/29/24 0231  WBC 16.0*  --  17.7* 23.3*  HGB 7.7* 7.8* 7.6* 8.1*  HCT 24.6* 23.0* 23.5* 25.5*  MCV 100.4*  --  99.2 98.8  PLT 214  --  226 234    Scheduled Meds:  Chlorhexidine  Gluconate Cloth  6 each Topical Daily   Chlorhexidine  Gluconate Cloth  6 each Topical Q0600   insulin  aspart  0-20 Units Subcutaneous Q4H   insulin  aspart  8 Units Subcutaneous Q4H   insulin  glargine-yfgn  50 Units Subcutaneous BID   multivitamin  1 tablet Per Tube QHS   polyethylene glycol  17 g Per Tube BID   senna-docusate  2 tablet Per Tube QHS   sertraline  50 mg Per Tube Daily   sodium chloride  flush  3 mL Intravenous Q12H   Continuous Infusions:  anticoagulant sodium citrate     feeding supplement (GLUCERNA 1.5 CAL) 1,000 mL (07/28/24 2022)   heparin  1,200 Units/hr (07/28/24 2036)   PRN Meds:.acetaminophen , albuterol, alteplase, anticoagulant sodium citrate, artificial tears, bisacodyl , guaiFENesin, heparin , heparin , iohexol , lidocaine  (PF), lidocaine -prilocaine, melatonin, ondansetron  (ZOFRAN ) IV, mouth rinse, oxyCODONE , pentafluoroprop-tetrafluoroeth, phenol,  polyethylene glycol, sodium chloride    Bernardino Gasman  MD 07/29/2024, 9:11 AM

## 2024-07-29 NOTE — Progress Notes (Signed)
 OT Cancellation Note  Patient Details Name: Lawrence [name] MRN: [id number] DOB: Jun 05, 1946   Cancelled Treatment:    Reason Eval/Treat Not Completed: Patient at procedure or test/ unavailable- pt in HD. Will follow and see as able.  Etta NOVAK, OT Acute Rehabilitation Services Office [phone] Secure Chat Preferred    Etta GORMAN [place] 07/29/2024, 8:16 AM

## 2024-07-29 NOTE — Progress Notes (Signed)
 NAEs o/n  Patient in hemodialysis, exam deferred  - cont supportive care

## 2024-07-29 NOTE — Progress Notes (Signed)
 PHARMACY - ANTICOAGULATION CONSULT NOTE  Pharmacy Consult for heparin  Indication: pulmonary embolus  Allergies  Allergen Reactions   Penicillins Hives   Lucentis [Ranibizumab] Rash    Patient Measurements: Height: 6' 1 (185.4 cm) Weight:  (bed scale weight inaccurate.) IBW/kg (Calculated) : 79.9 HEPARIN  DW (KG): 81.8  Vital Signs: Temp: 98.8 F (37.1 C) (11/07 1244) Temp Source: Oral (11/07 1244) BP: 124/43 (11/07 1244) Pulse Rate: 83 (11/07 1244)  Labs: Recent Labs    07/27/24 0525 07/27/24 1815 07/28/24 0152 07/28/24 0434 07/28/24 1517 07/29/24 0231 07/29/24 0659  HGB 7.7*  --  7.8* 7.6*  --  8.1*  --   HCT 24.6*  --  23.0* 23.5*  --  25.5*  --   PLT 214  --   --  226  --  234  --   HEPARINUNFRC 0.49  --   --  0.65  --  0.51  --   CREATININE 3.95*   < >  --  3.88* 4.78* 5.07* 5.33*   < > = values in this interval not displayed.    Estimated Creatinine Clearance: 11.2 mL/min (A) (by C-G formula based on SCr of 5.33 mg/dL (H)).   Assessment: Lawrence [name] is a 41 YOM admitted with concern for spinal cord injury. Patient taken to the OR for neurosurgery, procedure complicated by multiple rounds of CPR.   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 S/p IR 10/27 for thrombectomy.  Heparin  level 0.51 is therapeutic with heparin  running at 1200 units/hr.  Hgb stable (7-8).Platelets are within normal limits.  No bleeding noted    Goal of Therapy:  Heparin  level 0.3-0.7 units/ml Monitor platelets by anticoagulation protocol: Yes   Plan:  Continue heparin  infusion at 1200 units/hr Monitor daily heparin  level, CBC, and signs/symptoms of bleeding F/u plans for transition to oral [facility] when procedures complete, potential need for [facility]   Powell Blush, PharmD, BCCCP  Clinical Pharmacist Please refer to [facility] for [facility] Pharmacy numbers 07/29/2024 12:58 PM

## 2024-07-30 ENCOUNTER — Inpatient Hospital Stay (HOSPITAL_COMMUNITY)

## 2024-07-30 DIAGNOSIS — G8220 Paraplegia, unspecified: Secondary | ICD-10-CM | POA: Diagnosis not present

## 2024-07-30 DIAGNOSIS — S14107A Unspecified injury at C7 level of cervical spinal cord, initial encounter: Secondary | ICD-10-CM | POA: Diagnosis not present

## 2024-07-30 DIAGNOSIS — E1165 Type 2 diabetes mellitus with hyperglycemia: Secondary | ICD-10-CM | POA: Diagnosis not present

## 2024-07-30 DIAGNOSIS — R6510 Systemic inflammatory response syndrome (SIRS) of non-infectious origin without acute organ dysfunction: Secondary | ICD-10-CM

## 2024-07-30 DIAGNOSIS — N179 Acute kidney failure, unspecified: Secondary | ICD-10-CM | POA: Diagnosis not present

## 2024-07-30 LAB — POTASSIUM
Potassium: 5.8 mmol/L — ABNORMAL HIGH (ref 3.5–5.1)
Potassium: 5.7 mmol/L — ABNORMAL HIGH (ref 3.5–5.1)
Potassium: 5.8 mmol/L — ABNORMAL HIGH (ref 3.5–5.1)

## 2024-07-30 LAB — GLUCOSE, CAPILLARY
Glucose-Capillary: 123 mg/dL — ABNORMAL HIGH (ref 70–99)
Glucose-Capillary: 126 mg/dL — ABNORMAL HIGH (ref 70–99)
Glucose-Capillary: 132 mg/dL — ABNORMAL HIGH (ref 70–99)
Glucose-Capillary: 136 mg/dL — ABNORMAL HIGH (ref 70–99)
Glucose-Capillary: 137 mg/dL — ABNORMAL HIGH (ref 70–99)
Glucose-Capillary: 155 mg/dL — ABNORMAL HIGH (ref 70–99)
Glucose-Capillary: 158 mg/dL — ABNORMAL HIGH (ref 70–99)
Glucose-Capillary: 160 mg/dL — ABNORMAL HIGH (ref 70–99)

## 2024-07-30 LAB — RENAL FUNCTION PANEL
Albumin: 2.1 g/dL — ABNORMAL LOW (ref 3.5–5.0)
Anion gap: 18 — ABNORMAL HIGH (ref 5–15)
BUN: 113 mg/dL — ABNORMAL HIGH (ref 8–23)
CO2: 25 mmol/L (ref 22–32)
Calcium: 8.6 mg/dL — ABNORMAL LOW (ref 8.9–10.3)
Chloride: 93 mmol/L — ABNORMAL LOW (ref 98–111)
Creatinine, Ser: 4.72 mg/dL — ABNORMAL HIGH (ref 0.61–1.24)
GFR, Estimated: 12 mL/min — ABNORMAL LOW (ref >60)
Glucose, Bld: 131 mg/dL — ABNORMAL HIGH (ref 70–99)
Phosphorus: 6.3 mg/dL — ABNORMAL HIGH (ref 2.5–4.6)
Potassium: 5.8 mmol/L — ABNORMAL HIGH (ref 3.5–5.1)
Sodium: 136 mmol/L (ref 135–145)

## 2024-07-30 LAB — URINALYSIS, ROUTINE W REFLEX MICROSCOPIC
Glucose, UA: 50 mg/dL — AB
Ketones, ur: 5 mg/dL — AB
Leukocytes,Ua: NEGATIVE
Nitrite: NEGATIVE
Protein, ur: 30 mg/dL — AB
Specific Gravity, Urine: 1.014 (ref 1.005–1.030)
pH: 5 (ref 5.0–8.0)

## 2024-07-30 LAB — CBC
HCT: 23 % — ABNORMAL LOW (ref 39.0–52.0)
Hemoglobin: 7.4 g/dL — ABNORMAL LOW (ref 13.0–17.0)
MCH: 31.8 pg (ref 26.0–34.0)
MCHC: 32.2 g/dL (ref 30.0–36.0)
MCV: 98.7 fL (ref 80.0–100.0)
Platelets: 250 K/uL (ref 150–400)
RBC: 2.33 MIL/uL — ABNORMAL LOW (ref 4.22–5.81)
RDW: 19.6 % — ABNORMAL HIGH (ref 11.5–15.5)
WBC: 16.2 K/uL — ABNORMAL HIGH (ref 4.0–10.5)
nRBC: 0.3 % — ABNORMAL HIGH (ref 0.0–0.2)

## 2024-07-30 LAB — BLOOD GAS, VENOUS
Acid-Base Excess: 5.2 mmol/L — ABNORMAL HIGH (ref 0.0–2.0)
Bicarbonate: 29 mmol/L — ABNORMAL HIGH (ref 20.0–28.0)
O2 Saturation: 95.2 %
Patient temperature: 37
pCO2, Ven: 39 mmHg — ABNORMAL LOW (ref 44–60)
pH, Ven: 7.48 — ABNORMAL HIGH (ref 7.25–7.43)
pO2, Ven: 64 mmHg — ABNORMAL HIGH (ref 32–45)

## 2024-07-30 LAB — MAGNESIUM: Magnesium: 2.3 mg/dL (ref 1.7–2.4)

## 2024-07-30 LAB — HEPARIN LEVEL (UNFRACTIONATED): Heparin Unfractionated: 0.3 [IU]/mL (ref 0.30–0.70)

## 2024-07-30 MED ORDER — LIDOCAINE-PRILOCAINE 2.5-2.5 % EX CREA: 1.0000 | TOPICAL_CREAM | CUTANEOUS | Status: DC | PRN

## 2024-07-30 MED ORDER — HEPARIN SODIUM (PORCINE) 1000 UNIT/ML IJ SOLN
2800.0000 [IU] | Freq: Once | INTRAMUSCULAR | Status: AC
  Administered 2024-07-30: 2800 [IU]

## 2024-07-30 MED ORDER — ORAL CARE MOUTH RINSE: 15.0000 mL | OROMUCOSAL | Status: DC | PRN

## 2024-07-30 MED ORDER — PENTAFLUOROPROP-TETRAFLUOROETH EX AERO
1.0000 | INHALATION_SPRAY | CUTANEOUS | Status: DC | PRN
Start: 2024-07-30 — End: 2024-07-30

## 2024-07-30 MED ORDER — ORAL CARE MOUTH RINSE
15.0000 mL | OROMUCOSAL | Status: DC
  Administered 2024-07-30 – 2024-07-31 (×4): 15 mL via OROMUCOSAL

## 2024-07-30 MED ORDER — ALTEPLASE 2 MG IJ SOLR: 2.0000 mg | Freq: Once | INTRAMUSCULAR | Status: DC | PRN

## 2024-07-30 MED ORDER — ANTICOAGULANT SODIUM CITRATE 4% (200MG/5ML) IV SOLN: 5.0000 mL | Status: DC | PRN

## 2024-07-30 MED ORDER — SODIUM CHLORIDE 0.9 % IV SOLN
2.0000 g | INTRAVENOUS | Status: DC
  Administered 2024-07-30: 2 g via INTRAVENOUS
  Filled 2024-07-30: qty 20

## 2024-07-30 MED ORDER — HEPARIN SODIUM (PORCINE) 1000 UNIT/ML IJ SOLN
INTRAMUSCULAR | Status: AC
  Filled 2024-07-30: qty 3

## 2024-07-30 MED ORDER — METRONIDAZOLE 500 MG/100ML IV SOLN
500.0000 mg | Freq: Two times a day (BID) | INTRAVENOUS | Status: DC
  Administered 2024-07-30 – 2024-07-31 (×2): 500 mg via INTRAVENOUS
  Filled 2024-07-30 (×4): qty 100

## 2024-07-30 MED ORDER — LIDOCAINE HCL (PF) 1 % IJ SOLN: 5.0000 mL | INTRAMUSCULAR | Status: DC | PRN

## 2024-07-30 MED ORDER — HEPARIN SODIUM (PORCINE) 1000 UNIT/ML DIALYSIS: 1000.0000 [IU] | INTRAMUSCULAR | Status: DC | PRN

## 2024-07-30 NOTE — Progress Notes (Signed)
 [facility] Progress Note  Name: Lawrence [name] MRN: [id number] DOB: 1946-03-13   Subjective:  Seen and examined on dialysis.  Procedure supervised.  Blood pressure 132/44 and HR 81.  Left femoral tunneled catheter in use.  He had 300 mL UOP over 11/7.  Had HD again on 11/7.     Review of systems:  Unable to obtain secondary to AMS ----------------  Background on consult:  Lawrence [name] is an 78 y.o. male diabetes, hypertension, BPH status post TURP initially presenting after a ground-level fall about 5 days prior to admission. Of note over the past few weeks he has had neuropathy in his feet, felt more unsteady and used a cane because of his neuropathy.  Unfortunately he slipped and fell and hit his head on the sink followed by new onset numbness in his legs, inability to sit up and loss of function in his legs as well as urinary incontinence.  He was to have a partial anterior cervical discectomy C7-T1 to stabilize even though he was not likely to regain strength in his lower extremities but during the operative course he developed PEA arrest x 2 with subsequent CT scan showing a massive bilateral PE with poor lung perfusion.  Patient also had successful thrombectomy performed by VIR removing a large clot burden at the right PA bifurcation as well as a large clot burden in the left lower lung lobe. BL cr is approximately 0.9-1.1 (12/02/22) but with his complicated course he has developed renal failure with very poor urine output.    Intake/Output Summary (Last 24 hours) at 07/30/2024 1152 Last data filed at 07/30/2024 0600 Gross per 24 hour  Intake 1650.48 ml  Output 2300 ml  Net -649.52 ml    Vitals:  Vitals:   07/30/24 1015 07/30/24 1030 07/30/24 1100 07/30/24 1130  BP: (!) 136/38 (!) 136/39 (!) 127/42 (!) 120/41  Pulse: 82 81 82 81  Resp: 18 17 15 15   Temp: 99.3 F (37.4 C)     TempSrc:      SpO2: 99% 98% 97% 97%  Weight: 76.5 kg     Height:         Physical  Exam:  General adult male in bed in no acute distress Neck patient in C-collar  Lungs clear to auscultation bilaterally anteriorly normal work of breathing at rest; on room air Heart S1S2 no rub Abdomen soft nontender nondistended Extremities no edema  Neuro patient does not respond to voice for me Access left femoral tunneled catheter in use    Medications reviewed   Labs:     Latest Ref Rng & Units 07/30/2024    8:39 AM 07/30/2024    5:53 AM 07/30/2024    5:50 AM  BMP  Glucose 70 - 99 mg/dL   868   BUN 8 - 23 mg/dL   886   Creatinine 9.38 - 1.24 mg/dL   5.27   Sodium 864 - 854 mmol/L   136   Potassium 3.5 - 5.1 mmol/L 5.8  5.7  5.8   Chloride 98 - 111 mmol/L   93   CO2 22 - 32 mmol/L   25   Calcium 8.9 - 10.3 mg/dL   8.6      Assessment/Plan:     # AKI  Secondary to ischemic ATN + CIN anuric at this current time with hypotension, PEA arrest x 2, CTA as well as successful thrombectomy.  S/p CRRT 10/29 - 11/4 and now on iHD He does not  want long-term dialysis but is willing to give a trial with short-term dialysis to see if he has any recovery - Note that he is a poor long term HD candidate given poor functional and nutritional status. Assess dialysis needs daily.  Additional HD today for hyperkalemia     Hyperkalemia - despite HD yesterday.  Secondary to AKI and his feeds I transitioned him to nepro  S/p intermittent lokelma HD today as above  Renal panel daily PE s/p at least 2 PEA arrests with ROSC was not a candidate for lytics because of surgery status post successful thrombectomy by VIR C7 fracture with paraplegia affecting the lower extremities which may not be reversible. Per primary team and neurosurgery  Acute respiratory failure -off sedation and alert, and possible extubation trial pending weaning parameters Cardiogenic shock secondary to pulmonary embolus  - improved Anemia Macrocytic: transfusions per primary team parameters    Disposition - continue  inpatient monitoring    Lawrence JAYSON Saba, MD 07/30/2024 12:06 PM

## 2024-07-30 NOTE — Progress Notes (Signed)
 Assessment 78 y/o who presented with C7-T1 SCI. Underwent attempted C7-T1 ACDF with intra-op coding event in which they had to abort the case early.  LOS: 13 days    Plan: No plans to reattempt surgery Continue supportive cares Heparin  ***  Subjective: ***  Objective: Vital signs in last 24 hours: Temp:  [97.9 F (36.6 C)-101.8 F (38.8 C)] 101.3 F (38.5 C) (11/08 0600) Pulse Rate:  [77-93] 85 (11/08 0600) Resp:  [14-25] 18 (11/08 0600) BP: (106-143)/(38-64) 133/41 (11/08 0600) SpO2:  [91 %-98 %] 94 % (11/08 0600)  Intake/Output from previous day: 11/07 0701 - 11/08 0700 In: 1650.5 [I.V.:318.7; NG/GT:1331.8] Out: 2300 [Urine:300] Intake/Output this shift: No intake/output data recorded.  Exam: ***  Lab Results: Recent Labs    07/29/24 0231 07/29/24 1357  WBC 23.3* 23.5*  HGB 8.1* 8.7*  HCT 25.5* 26.7*  PLT 234 252   BMET Recent Labs    07/29/24 1554 07/29/24 1835 07/30/24 0550 07/30/24 0553  NA 133*  --  136  --   K 5.7*   < > 5.8* 5.7*  CL 95*  --  93*  --   CO2 21*  --  25  --   GLUCOSE 216*  --  131*  --   BUN 81*  --  113*  --   CREATININE 3.66*  --  4.72*  --   CALCIUM 8.2*  --  8.6*  --    < > = values in this interval not displayed.    Studies/Results: No results found.    Lawrence [name] 07/30/2024, 8:15 AM

## 2024-07-30 NOTE — Progress Notes (Signed)
Patient is off unit to dialysis

## 2024-07-30 NOTE — Progress Notes (Signed)
 Received patient in bed to unit.  Alert and oriented.  Informed consent signed and in chart.   TX duration:3.5 hours  Patient tolerated well.  Transported back to the room  Alert, without acute distress.  Hand-off given to patient's nurse.   Access used: Left femoralHD cath, changed dressing Access issues: none  Total UF removed: 1.5L Medication(s) given: Rocephin   07/30/24 1408  Vitals  Temp 100.2 F (37.9 C)  BP (!) 127/48  Pulse Rate 85  Resp 13  Oxygen Therapy  SpO2 97 %  O2 Device Room Air  Patient Activity (if Appropriate) In bed  Pulse Oximetry Type Continuous  During Treatment Monitoring  Dialysate Potassium Concentration 2  Dialysate Calcium Concentration 2.5  Duration of HD Treatment -hour(s) 3.5 hour(s)  Cumulative Fluid Removed (mL) per Treatment  1476.96  HD Safety Checks Performed Yes  Intra-Hemodialysis Comments Tx completed;Tolerated well  Post Treatment  Dialyzer Clearance Clear  Liters Processed 84  Fluid Removed (mL) 1500 mL  Tolerated HD Treatment Yes  Hemodialysis Catheter Left Femoral vein Triple lumen Temporary (Non-Tunneled)  Placement Date/Time: 07/20/24 (c) 1900   Orientation: Left  Access Location: Femoral vein  Hemodialysis Catheter Type: Triple lumen Temporary (Non-Tunneled)  Site Condition No complications  Blue Lumen Status Flushed;Antimicrobial dead end cap;Dead end cap in place  Red Lumen Status Flushed;Antimicrobial dead end cap;Dead end cap in place  Purple Lumen Status Saline locked  Catheter fill solution Heparin  1000 units/ml  Catheter fill volume (Arterial) 1.4 cc  Catheter fill volume (Venous) 1.4  Dressing Type Transparent  Dressing Status Antimicrobial disc/dressing in place;Clean, Dry, Intact  Drainage Description None  Dressing Change Due 08/06/24  Post treatment catheter status Capped and Clamped     Camellia Brasil LPN [facility]

## 2024-07-30 NOTE — Progress Notes (Signed)
 PHARMACY - ANTICOAGULATION CONSULT NOTE  Pharmacy Consult for heparin  Indication: pulmonary embolus  Allergies  Allergen Reactions   Penicillins Hives   Lucentis [Ranibizumab] Rash    Patient Measurements: Height: 6' 1 (185.4 cm) Weight:  (bed scale weight inaccurate.) IBW/kg (Calculated) : 79.9 HEPARIN  DW (KG): 81.8  Vital Signs: Temp: 100.5 F (38.1 C) (11/08 0745) Temp Source: Axillary (11/08 0745) BP: 130/41 (11/08 0745) Pulse Rate: 83 (11/08 0745)  Labs: Recent Labs    07/28/24 0434 07/28/24 1517 07/29/24 0231 07/29/24 0659 07/29/24 1357 07/29/24 1554 07/30/24 0550 07/30/24 0839  HGB 7.6*  --  8.1*  --  8.7*  --   --  7.4*  HCT 23.5*  --  25.5*  --  26.7*  --   --  23.0*  PLT 226  --  234  --  252  --   --  250  HEPARINUNFRC 0.65  --  0.51  --   --   --  0.30  --   CREATININE 3.88*   < > 5.07* 5.33*  --  3.66* 4.72*  --    < > = values in this interval not displayed.    Estimated Creatinine Clearance: 12.6 mL/min (A) (by C-G formula based on SCr of 4.72 mg/dL (H)).   Assessment: Lawrence [name] is a 29 YOM admitted with concern for spinal cord injury. Patient taken to the OR for neurosurgery, procedure complicated by multiple rounds of CPR.   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 S/p IR 10/27 for thrombectomy.  Heparin  level 0.30 is therapeutic with heparin  running at 1200 units/hr.  Hgb stable (7-8).Platelets are within normal limits.  No bleeding noted   Goal of Therapy:  Heparin  level 0.3-0.7 units/ml Monitor platelets by anticoagulation protocol: Yes   Plan:  Continue heparin  infusion at 1200 units/hr - noted downtrend in heparin  level suspected hemodialysis is playing some role as patient as gotten HD on 11/7 and again on 11/8.Patient has been therapeutic on this regimen > 1 week.   Monitor daily heparin  level, CBC, and signs/symptoms of bleeding F/u plans for transition to oral AC as able, continue  IV heparin  for now per TRH until mental status is improved.   Lawrence [name], PharmD, BCCCP  Clinical Pharmacist Please refer to [facility] for [facility] Pharmacy numbers 07/30/2024 9:32 AM

## 2024-07-30 NOTE — Progress Notes (Signed)
Patient returned to unit from dialysis.

## 2024-07-30 NOTE — Progress Notes (Addendum)
 PROGRESS NOTE    Lawrence [name]  FMW:[id number] DOB: Jul 15, 1946 DOA: 07/16/2024 PCP: Viviana Candyce HERO, MD   Brief Narrative: Lawrence [name] is a 78 y.o. male with a history of hypertension, diabetes, BPH.  Patient presented secondary to fall after lower extremity weakness with complete loss of sensation in with associated urinary incontinence.  Imaging on admission significant for traumatic cord injury with cord contusion and edema and acute fracture of C7-T1.  Neurosurgery took patient to the OR for anterior cervical discectomy of C7-T1, however this was aborted secondary to recurrent PEA cardiac arrest.  Patient found to have severe right ventricle dysfunction in addition to moderate to severe left ventricular dysfunction and transferred to ICU.  Patient with recurrent PEA arrest while in ICU with subsequent CTA chest significant for bilateral PE.  TNK/tPA deferred secondary to recent neurosurgical procedure and instead patient underwent mechanical thrombectomy.  During hospitalization, patient's course was complicated by development of anuric AKI requiring CRRT with transition to intermittent hemodialysis.   Assessment and Plan:  Bilateral pulmonary embolism Noted on CTA chest after patient's suffered multiple PEA arrests. Patient was not a candidate for TNK/TPA secondary to neurosurgical surgery. IR was consulted and performed a mechanical thrombectomy on 10/27. Heparin  IV started for management. -Continue heparin  IV  S/p PEA cardiac arrest Secondary to bilateral PE and resultant associated cardiogenic shock.  Cardiogenic shock Acute RV failure Secondary to bilateral PE. Cardiology consulted for consideration of ECMO. Per cardiology, patient not a candidate.  Acute respiratory failure with hypoxia Secondary to cardiac arrest requiring intubation and mechanical ventilation.  AKI Secondary to ischemic ATN and CIN. Anuric. A left femoral vein hemodialysis catheter was placed on 10/29 and  patient started on CRRT while in ICU. Patient transitioned to intermittent hemodialysis on 11/5. He has received 2 sessions to date. -Nephrology recommendations: Continue hemodialysis  Hyperkalemia Secondary to renal impairment. Management with hemodialysis and Lokelma. -Continue telemetry  Rib fractures Secondary to chest compressions during CPR -Continue supportive care  C7/T1 spinal cord injury Displaced C7 vertebral fracture Patient evaluated by neurosurgery who attempted to performed a partial anterior cervical discectomy of C7-T1 on 10/26, which was aborted.  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 Concern for possible neurogenic bladder. Patient previously had a foley catheter which was removed. Patient with retention yesterday requiring in/out catheterization -Continue therapy -Bladder scans for retention; in/out cath as needed  Shock liver Related to cardiogenic shock. AST/ALT as high as 1,476/978 respectively. Resolved.  Bilateral multifocal pneumonia Patient completed antibiotic therapy with Vancomycin, Cefepime and Ceftriaxone.  Anemia of critical illness Acute blood loss anemia Baseline hemoglobin of about 14 on admission. Patient with acute drop in hemoglobin presumed related to CRRT, in addition to overall critical illness.  Diabetes mellitus type 2 Poorly controlled with hyperglycemia based on hemoglobin A1C of 12.3%. Patient is managed on Jardiance and Tresiba  as an outpatient. -Continue Semglee -Continue Novolog  TID with meals -Continue SSI  SIRS Not present on admission. Unclear etiology. Would presume pneumonia vs urinary source of infection. Associated leukocytosis.  -Check blood cultures -Check urine culture -Chest x-ray -Antibiotics pending source identification  Addendum: Sepsis Chest x-ray concerning for developing infection in right lower lung. Likely aspiration source. Leukocytosis is improved today from yesterday, however. Will start Unasyn  IV.  Dysphagia Complicated by critical illness and recent intubation, in addition to cognitive impairments. Cortrak feeding tube placed and tube feeds started. Speech therapy continues to recommend NPO at this time.  Acute encephalopathy Presumed metabolic/uremic. Also possibly related to  underlying infection or since he is s/p hemodialysis. He had a similar episode which occurred after his first hemodialysis session with negative workup at that time. -Check VBG  Situational depression Patient started on Zoloft this admission. -Continue Zoloft  Pressure injury Left buttocks. Unclear if present on admission.   DVT prophylaxis: SCDs, Heparin  IV Code Status:   Code Status: Full Code Family Communication: Wife at bedside Disposition Plan: Discharge pending ongoing specialist recommendations, decision on nutrition intake, stable leukocytosis, ongoing therapy recommendations   Consultants:  Neurosurgery PCCM Cardiology Interventional radiology  Procedures:  CPR Intubation/extubation Cortrak feeding tube placement Partial anterior cervical discectomy, C7-T1, aborted CRRT Hemodialysis  Antimicrobials: Ceftriaxone Cefepime Vancomycin    Subjective: Febrile overnight with Tmax of 101.8 F. Per wife, patient has been more somnolent today.   Objective: BP (!) 133/41   Pulse 85   Temp (!) 101.3 F (38.5 C) (Axillary)   Resp 18   Ht 6' 1 (1.854 m)   Wt 67.1 kg   SpO2 94%   BMI 19.52 kg/m   Examination:  General exam: Appears calm and comfortable. Respiratory system: Clear to auscultation. Respiratory effort normal. Cardiovascular system: S1 & S2 heard, RRR. 2/6 systolic murmur. Gastrointestinal system: Abdomen is nondistended, soft and nontender. Normal bowel sounds heard. Central nervous system: Lethargic but seems to respond to voice somewhat and painful stimuli Musculoskeletal: No edema. No calf tenderness Skin: No cyanosis. No rashes   Data Reviewed: I have  personally reviewed following labs and imaging studies  CBC Lab Results  Component Value Date   WBC 23.5 (H) 07/29/2024   RBC 2.74 (L) 07/29/2024   HGB 8.7 (L) 07/29/2024   HCT 26.7 (L) 07/29/2024   MCV 97.4 07/29/2024   MCH 31.8 07/29/2024   PLT 252 07/29/2024   MCHC 32.6 07/29/2024   RDW 18.9 (H) 07/29/2024   LYMPHSABS 0.2 (L) 07/29/2024   MONOABS 1.3 (H) 07/29/2024   EOSABS 0.0 07/29/2024   BASOSABS 0.0 07/29/2024     Last metabolic panel Lab Results  Component Value Date   NA 136 07/30/2024   K 5.7 (H) 07/30/2024   CL 93 (L) 07/30/2024   CO2 25 07/30/2024   BUN 113 (H) 07/30/2024   CREATININE 4.72 (H) 07/30/2024   GLUCOSE 131 (H) 07/30/2024   GFRNONAA 12 (L) 07/30/2024   GFRAA 65 02/21/2021   CALCIUM 8.6 (L) 07/30/2024   PHOS 6.3 (H) 07/30/2024   PROT 5.7 (L) 07/25/2024   ALBUMIN 2.1 (L) 07/30/2024   BILITOT 1.0 07/25/2024   ALKPHOS 79 07/25/2024   AST 31 07/25/2024   ALT 128 (H) 07/25/2024   ANIONGAP 18 (H) 07/30/2024    GFR: Estimated Creatinine Clearance: 12.6 mL/min (A) (by C-G formula based on SCr of 4.72 mg/dL (H)).  No results found for this or any previous visit (from the past 240 hours).    Radiology Studies: DG CHEST PORT 1 VIEW Result Date: 07/30/2024 EXAM: 1 VIEW(S) XRAY OF THE CHEST 07/30/2024 08:06:00 AM COMPARISON: 07/17/2024 CLINICAL HISTORY: Fever; Sepsis (HCC) FINDINGS: LINES, TUBES AND DEVICES: Extubated. Right IJ central line removed. New enteric tube extending below the diaphragm, tip not included. Stable lower left neck skin staples. LUNGS AND PLEURA: New mild patchy right lower lung opacity. No pulmonary edema. No pleural effusion. No pneumothorax. HEART AND MEDIASTINUM: No acute abnormality of the cardiac and mediastinal silhouettes. BONES AND SOFT TISSUES: Paucity of bowel gas. No acute osseous abnormality. IMPRESSION: 1. New mild patchy right lower lung opacity, suspicious for developing infection  or aspiration. Electronically signed  by: Waddell Calk MD 07/30/2024 08:35 AM EST RP Workstation: [id number]      LOS: 13 days    Elgin Lam, MD [facility] 07/30/2024, 8:40 AM   If 7PM-7AM, please contact night-coverage [email or web address]

## 2024-07-31 ENCOUNTER — Inpatient Hospital Stay (HOSPITAL_COMMUNITY)

## 2024-07-31 ENCOUNTER — Inpatient Hospital Stay (HOSPITAL_COMMUNITY): Admitting: Certified Registered Nurse Anesthetist

## 2024-07-31 ENCOUNTER — Other Ambulatory Visit: Payer: Self-pay | Admitting: Internal Medicine

## 2024-07-31 DIAGNOSIS — Z9889 Other specified postprocedural states: Secondary | ICD-10-CM

## 2024-07-31 DIAGNOSIS — R748 Abnormal levels of other serum enzymes: Secondary | ICD-10-CM

## 2024-07-31 DIAGNOSIS — R531 Weakness: Secondary | ICD-10-CM

## 2024-07-31 DIAGNOSIS — G8220 Paraplegia, unspecified: Secondary | ICD-10-CM | POA: Diagnosis not present

## 2024-07-31 DIAGNOSIS — N179 Acute kidney failure, unspecified: Secondary | ICD-10-CM | POA: Diagnosis not present

## 2024-07-31 DIAGNOSIS — I469 Cardiac arrest, cause unspecified: Secondary | ICD-10-CM

## 2024-07-31 DIAGNOSIS — Z9289 Personal history of other medical treatment: Secondary | ICD-10-CM

## 2024-07-31 DIAGNOSIS — E875 Hyperkalemia: Secondary | ICD-10-CM | POA: Diagnosis not present

## 2024-07-31 LAB — RENAL FUNCTION PANEL
Albumin: 1.8 g/dL — ABNORMAL LOW (ref 3.5–5.0)
Albumin: 2 g/dL — ABNORMAL LOW (ref 3.5–5.0)
Anion gap: 16 — ABNORMAL HIGH (ref 5–15)
Anion gap: 17 — ABNORMAL HIGH (ref 5–15)
BUN: 92 mg/dL — ABNORMAL HIGH (ref 8–23)
BUN: 107 mg/dL — ABNORMAL HIGH (ref 8–23)
CO2: 24 mmol/L (ref 22–32)
CO2: 21 mmol/L — ABNORMAL LOW (ref 22–32)
Calcium: 8.6 mg/dL — ABNORMAL LOW (ref 8.9–10.3)
Calcium: 8.5 mg/dL — ABNORMAL LOW (ref 8.9–10.3)
Chloride: 94 mmol/L — ABNORMAL LOW (ref 98–111)
Chloride: 96 mmol/L — ABNORMAL LOW (ref 98–111)
Creatinine, Ser: 4.4 mg/dL — ABNORMAL HIGH (ref 0.61–1.24)
Creatinine, Ser: 4.7 mg/dL — ABNORMAL HIGH (ref 0.61–1.24)
GFR, Estimated: 13 mL/min — ABNORMAL LOW (ref >60)
GFR, Estimated: 12 mL/min — ABNORMAL LOW (ref >60)
Glucose, Bld: 140 mg/dL — ABNORMAL HIGH (ref 70–99)
Glucose, Bld: 194 mg/dL — ABNORMAL HIGH (ref 70–99)
Phosphorus: 6 mg/dL — ABNORMAL HIGH (ref 2.5–4.6)
Phosphorus: 6.6 mg/dL — ABNORMAL HIGH (ref 2.5–4.6)
Potassium: 5.2 mmol/L — ABNORMAL HIGH (ref 3.5–5.1)
Potassium: 5.3 mmol/L — ABNORMAL HIGH (ref 3.5–5.1)
Sodium: 134 mmol/L — ABNORMAL LOW (ref 135–145)
Sodium: 134 mmol/L — ABNORMAL LOW (ref 135–145)

## 2024-07-31 LAB — POCT I-STAT 7, (LYTES, BLD GAS, ICA,H+H)
Acid-base deficit: 6 mmol/L — ABNORMAL HIGH (ref 0.0–2.0)
Bicarbonate: 19.6 mmol/L — ABNORMAL LOW (ref 20.0–28.0)
Calcium, Ion: 1.19 mmol/L (ref 1.15–1.40)
HCT: 22 % — ABNORMAL LOW (ref 39.0–52.0)
Hemoglobin: 7.5 g/dL — ABNORMAL LOW (ref 13.0–17.0)
O2 Saturation: 100 %
Potassium: 5.4 mmol/L — ABNORMAL HIGH (ref 3.5–5.1)
Sodium: 132 mmol/L — ABNORMAL LOW (ref 135–145)
TCO2: 21 mmol/L — ABNORMAL LOW (ref 22–32)
pCO2 arterial: 35.7 mmHg (ref 32–48)
pH, Arterial: 7.348 — ABNORMAL LOW (ref 7.35–7.45)
pO2, Arterial: 329 mmHg — ABNORMAL HIGH (ref 83–108)

## 2024-07-31 LAB — URINE CULTURE: Culture: NO GROWTH

## 2024-07-31 LAB — CBC
HCT: 22.9 % — ABNORMAL LOW (ref 39.0–52.0)
Hemoglobin: 7.3 g/dL — ABNORMAL LOW (ref 13.0–17.0)
MCH: 31.7 pg (ref 26.0–34.0)
MCHC: 31.9 g/dL (ref 30.0–36.0)
MCV: 99.6 fL (ref 80.0–100.0)
Platelets: 270 K/uL (ref 150–400)
RBC: 2.3 MIL/uL — ABNORMAL LOW (ref 4.22–5.81)
RDW: 18.7 % — ABNORMAL HIGH (ref 11.5–15.5)
WBC: 11.4 K/uL — ABNORMAL HIGH (ref 4.0–10.5)
nRBC: 0.4 % — ABNORMAL HIGH (ref 0.0–0.2)

## 2024-07-31 LAB — HEPARIN LEVEL (UNFRACTIONATED)
Heparin Unfractionated: 0.29 [IU]/mL — ABNORMAL LOW (ref 0.30–0.70)
Heparin Unfractionated: <0.1 [IU]/mL — ABNORMAL LOW (ref 0.30–0.70)

## 2024-07-31 LAB — GLUCOSE, CAPILLARY
Glucose-Capillary: 119 mg/dL — ABNORMAL HIGH (ref 70–99)
Glucose-Capillary: 102 mg/dL — ABNORMAL HIGH (ref 70–99)
Glucose-Capillary: 175 mg/dL — ABNORMAL HIGH (ref 70–99)
Glucose-Capillary: 176 mg/dL — ABNORMAL HIGH (ref 70–99)
Glucose-Capillary: 115 mg/dL — ABNORMAL HIGH (ref 70–99)
Glucose-Capillary: 106 mg/dL — ABNORMAL HIGH (ref 70–99)

## 2024-07-31 LAB — LACTIC ACID, PLASMA: Lactic Acid, Venous: 1.5 mmol/L (ref 0.5–1.9)

## 2024-07-31 LAB — ECHOCARDIOGRAM LIMITED
Calc EF: 63.8 %
Height: 73 in
S' Lateral: 2.4 cm
Single Plane A2C EF: 65 %
Single Plane A4C EF: 62.4 %
Weight: 2701.96 [oz_av]

## 2024-07-31 LAB — MAGNESIUM: Magnesium: 2.3 mg/dL (ref 1.7–2.4)

## 2024-07-31 MED ORDER — FENTANYL CITRATE (PF) 50 MCG/ML IJ SOSY
50.0000 ug | PREFILLED_SYRINGE | INTRAMUSCULAR | Status: DC | PRN
  Administered 2024-07-31: 100 ug via INTRAVENOUS

## 2024-07-31 MED ORDER — POLYETHYLENE GLYCOL 3350 17 G PO PACK: 17.0000 g | PACK | Freq: Every day | ORAL | Status: DC

## 2024-07-31 MED ORDER — SODIUM CHLORIDE 0.9 % IV BOLUS
1000.0000 mL | Freq: Once | INTRAVENOUS | Status: AC
  Administered 2024-07-31: 1000 mL via INTRAVENOUS

## 2024-07-31 MED ORDER — VANCOMYCIN VARIABLE DOSE PER UNSTABLE RENAL FUNCTION (PHARMACIST DOSING): Status: DC

## 2024-07-31 MED ORDER — SODIUM CHLORIDE 0.9 % IV SOLN
1.0000 g | Freq: Three times a day (TID) | INTRAVENOUS | Status: DC
  Administered 2024-07-31 – 2024-08-02 (×5): 1 g via INTRAVENOUS
  Filled 2024-07-31 (×5): qty 20

## 2024-07-31 MED ORDER — ALBUMIN HUMAN 25 % IV SOLN
25.0000 g | Freq: Four times a day (QID) | INTRAVENOUS | Status: AC
  Administered 2024-07-31 – 2024-08-01 (×4): 25 g via INTRAVENOUS
  Filled 2024-07-31 (×4): qty 100

## 2024-07-31 MED ORDER — VANCOMYCIN HCL 1750 MG/350ML IV SOLN
1750.0000 mg | Freq: Once | INTRAVENOUS | Status: AC
  Administered 2024-07-31: 1750 mg via INTRAVENOUS
  Filled 2024-07-31: qty 350

## 2024-07-31 MED ORDER — VANCOMYCIN HCL IN DEXTROSE 1-5 GM/200ML-% IV SOLN
1000.0000 mg | INTRAVENOUS | Status: DC
  Administered 2024-08-01: 1000 mg via INTRAVENOUS
  Filled 2024-07-31: qty 200

## 2024-07-31 MED ORDER — SODIUM CHLORIDE 0.9 % IV BOLUS
250.0000 mL | Freq: Once | INTRAVENOUS | Status: AC
  Administered 2024-07-31: 250 mL via INTRAVENOUS

## 2024-07-31 MED ORDER — PROPOFOL 1000 MG/100ML IV EMUL
0.0000 ug/kg/min | INTRAVENOUS | Status: DC
  Administered 2024-07-31: 30 ug/kg/min via INTRAVENOUS
  Administered 2024-07-31: 50 ug/kg/min via INTRAVENOUS
  Administered 2024-08-01: 30 ug/kg/min via INTRAVENOUS
  Filled 2024-07-31 (×4): qty 100

## 2024-07-31 MED ORDER — FENTANYL 2500MCG IN NS 250ML (10MCG/ML) PREMIX INFUSION
INTRAVENOUS | Status: AC
  Administered 2024-07-31: 50 ug/h
  Filled 2024-07-31: qty 250

## 2024-07-31 MED ORDER — ORAL CARE MOUTH RINSE
15.0000 mL | OROMUCOSAL | Status: DC
  Administered 2024-07-31 – 2024-08-02 (×25): 15 mL via OROMUCOSAL

## 2024-07-31 MED ORDER — ACETAMINOPHEN 10 MG/ML IV SOLN
1000.0000 mg | Freq: Four times a day (QID) | INTRAVENOUS | Status: AC
  Administered 2024-07-31 – 2024-08-01 (×4): 1000 mg via INTRAVENOUS
  Filled 2024-07-31 (×4): qty 100

## 2024-07-31 MED ORDER — DOCUSATE SODIUM 50 MG/5ML PO LIQD
100.0000 mg | Freq: Two times a day (BID) | ORAL | Status: DC
  Administered 2024-08-02: 100 mg
  Filled 2024-07-31: qty 10

## 2024-07-31 MED ORDER — FENTANYL CITRATE (PF) 50 MCG/ML IJ SOSY
50.0000 ug | PREFILLED_SYRINGE | INTRAMUSCULAR | Status: DC | PRN
  Filled 2024-07-31: qty 2

## 2024-07-31 MED ORDER — PRISMASOL BGK 4/2.5 32-4-2.5 MEQ/L EC SOLN: Status: DC

## 2024-07-31 MED ORDER — NOREPINEPHRINE 4 MG/250ML-% IV SOLN
INTRAVENOUS | Status: AC
  Filled 2024-07-31: qty 250

## 2024-07-31 MED ORDER — NOREPINEPHRINE 16 MG/250ML-% IV SOLN
0.0000 ug/min | INTRAVENOUS | Status: DC
  Administered 2024-07-31 – 2024-08-01 (×2): 4 ug/min via INTRAVENOUS
  Filled 2024-07-31 (×2): qty 250

## 2024-07-31 MED ORDER — PERFLUTREN LIPID MICROSPHERE
1.0000 mL | INTRAVENOUS | Status: AC | PRN
  Administered 2024-07-31: 2.5 mL via INTRAVENOUS

## 2024-07-31 MED ORDER — PRISMASOL BGK 2/3.5 32-2-3.5 MEQ/L EC SOLN: Status: DC

## 2024-07-31 MED ORDER — SODIUM CHLORIDE 0.9 % IV SOLN: INTRAVENOUS | Status: AC

## 2024-07-31 MED ORDER — SODIUM CHLORIDE 0.9 % IV SOLN: INTRAVENOUS | Status: AC | PRN

## 2024-07-31 MED ORDER — ORAL CARE MOUTH RINSE: 15.0000 mL | OROMUCOSAL | Status: DC | PRN

## 2024-07-31 MED ORDER — FENTANYL CITRATE (PF) 2500 MCG/50ML IJ SOLN
0.0000 ug/h | Status: DC
  Administered 2024-07-31: 50 ug/h via INTRAVENOUS
  Filled 2024-07-31: qty 100

## 2024-07-31 MED ORDER — FENTANYL 2500MCG IN NS 250ML (10MCG/ML) PREMIX INFUSION: 0.0000 ug/h | INTRAVENOUS | Status: DC

## 2024-07-31 MED ORDER — GUAIFENESIN 100 MG/5ML PO LIQD
5.0000 mL | Freq: Four times a day (QID) | ORAL | Status: DC
  Administered 2024-07-31 – 2024-08-02 (×7): 5 mL
  Filled 2024-07-31 (×6): qty 15

## 2024-07-31 MED ORDER — FENTANYL BOLUS VIA INFUSION: 25.0000 ug | INTRAVENOUS | Status: DC | PRN

## 2024-07-31 MED ORDER — ORAL CARE MOUTH RINSE: 15.0000 mL | OROMUCOSAL | Status: DC

## 2024-07-31 MED ORDER — HEPARIN SODIUM (PORCINE) 1000 UNIT/ML DIALYSIS
1000.0000 [IU] | INTRAMUSCULAR | Status: DC | PRN
  Administered 2024-08-01: 2000 [IU] via INTRAVENOUS_CENTRAL
  Filled 2024-07-31: qty 6
  Filled 2024-07-31: qty 2

## 2024-07-31 MED ORDER — SODIUM CHLORIDE 0.9 % IV SOLN
1.0000 g | INTRAVENOUS | Status: DC
  Administered 2024-07-31: 1 g via INTRAVENOUS
  Filled 2024-07-31: qty 20

## 2024-07-31 MED ORDER — EPINEPHRINE 1 MG/10ML IV SOSY
PREFILLED_SYRINGE | INTRAVENOUS | Status: AC
  Filled 2024-07-31: qty 10

## 2024-07-31 NOTE — Progress Notes (Signed)
 [facility] Progress Note  Name: Faheem Ziemann MRN: [id number] DOB: 1946/09/07   Subjective:  He just arrested and pulm is concerned about nocturnal hypoventilation.  Pulm is requesting CRRT.  He last had HD on 11/8 for hyperkalemia and had 3 kg UF.  His current HD catheter is a femoral catheter and had diarrhea near this per pulm.  He is now on vanc and meropenem.  He had 170 mL UOP over 11/8.  I wasn't able to reach his wife but did speak with his son and critical care has spoken with his family as well.  Per nursing, he had copious secretions noted today, as well.  He is now on levo at 10 mcg/min and intubated.  Critical care is at bedside and placing a new nontunneled catheter    Review of systems:  Unable to obtain secondary to AMS ----------------  Background on consult:  Hunner Garcon is an 78 y.o. male diabetes, hypertension, BPH status post TURP initially presenting after a ground-level fall about 5 days prior to admission. Of note over the past few weeks he has had neuropathy in his feet, felt more unsteady and used a cane because of his neuropathy.  Unfortunately he slipped and fell and hit his head on the sink followed by new onset numbness in his legs, inability to sit up and loss of function in his legs as well as urinary incontinence.  He was to have a partial anterior cervical discectomy C7-T1 to stabilize even though he was not likely to regain strength in his lower extremities but during the operative course he developed PEA arrest x 2 with subsequent CT scan showing a massive bilateral PE with poor lung perfusion.  Patient also had successful thrombectomy performed by VIR removing a large clot burden at the right PA bifurcation as well as a large clot burden in the left lower lung lobe. BL cr is approximately 0.9-1.1 (12/02/22) but with his complicated course he has developed renal failure with very poor urine output.    Intake/Output Summary (Last 24 hours) at  07/31/2024 0939 Last data filed at 07/30/2024 1900 Gross per 24 hour  Intake 1123.56 ml  Output 3170 ml  Net -2046.44 ml    Vitals:  Vitals:   07/30/24 2332 07/31/24 0256 07/31/24 0500 07/31/24 0700  BP: (!) 120/44 (!) 126/44  (!) 134/42  Pulse: 84 88  91  Resp: 16 19  18   Temp: 98.6 F (37 C) 99.6 F (37.6 C)  (!) 102.6 F (39.2 C)  TempSrc: Axillary Axillary  Axillary  SpO2: 95% 96%  93%  Weight:   76.6 kg   Height:         Physical Exam:    General adult male in bed critically ill and intubated  Neck patient in C-collar  Lungs scattered rhonchi; FIO2 50 and PEEP 8  Heart tachycardic, S1S2 Abdomen soft nondistended Extremities no edema appreciated  Neuro obtunded and sedated   Access RN is holding pressure at prior catheter site left fem    Medications reviewed   Labs:     Latest Ref Rng & Units 07/31/2024    4:34 AM 07/30/2024    8:39 AM 07/30/2024    5:53 AM  BMP  Glucose 70 - 99 mg/dL 859     BUN 8 - 23 mg/dL 92     Creatinine 9.38 - 1.24 mg/dL 5.59     Sodium 864 - 854 mmol/L 134     Potassium 3.5 - 5.1  mmol/L 5.2  5.8  5.7   Chloride 98 - 111 mmol/L 94     CO2 22 - 32 mmol/L 24     Calcium 8.9 - 10.3 mg/dL 8.6        Assessment/Plan:     # AKI  Secondary to ischemic ATN + CIN anuric at this current time with hypotension, PEA arrest x 2, CTA as well as successful thrombectomy.  S/p CRRT 10/29 - 11/4 and now on iHD Restarting CRRT (pt s/p arrest this AM) 2K fluids for now  UF goal keep even as tolerated  Trend BID labs  Per charting, he does not want long-term dialysis but was willing to undergo a trial of short-term dialysis to see if he has any recovery Note that he is a poor long term HD candidate given poor functional status   Cardiac arrest Pulmonary is concerned for nocturnal hypoventilation Hyperkalemia - despite HD yesterday.  Secondary to AKI and his feeds Starting back CRRT as above I transitioned him to nepro  S/p intermittent  lokelma Renal panel daily PE s/p at least 2 PEA arrests with ROSC was not a candidate for lytics because of surgery status post successful thrombectomy by VIR C7 fracture with paraplegia affecting the lower extremities which may not be reversible. Per primary team and neurosurgery  Acute respiratory failure -off sedation and alert, and possible extubation trial pending weaning parameters Cardiogenic shock secondary to pulmonary embolus   Anemia Macrocytic:  Transfusions per primary team parameters    Disposition - continue inpatient monitoring    Katheryn JAYSON Saba, MD 07/31/2024 10:09 AM

## 2024-07-31 NOTE — Procedures (Signed)
 Arterial Line Insertion Start/End11/05/2024 9:45 AM, 07/31/2024 10:04 AM  Patient location: ICU. Preanesthetic checklist: patient identified, IV checked, site marked, monitors and equipment checked and timeout performed Right, radial was placed Catheter size: 20 G Hand hygiene performed  and maximum sterile barriers used  Allen's test indicative of satisfactory collateral circulation Attempts: 1 Following insertion, dressing applied and Biopatch. Post procedure assessment: normal  Patient tolerated the procedure well with no immediate complications.

## 2024-07-31 NOTE — Procedures (Signed)
 Cardiopulmonary Resuscitation Note  Bernabe Dorce  [id number]  02/25/1946  Date:07/31/24  Time:9:00 AM   Provider Performing:Dorion Petillo JAYSON Sharps   Procedure: Cardiopulmonary Resuscitation [phone])  Indication(s) Loss of Pulse  Consent N/A  Anesthesia N/A   Time Out N/A   Sterile Technique Hand hygiene, gloves   Procedure Description Called to patient's room for CODE BLUE. Initial rhythm was PEA/Asystole. Patient received high quality chest compressions for 7 minutes with defibrillation or cardioversion when appropriate. Epinephrine was administered every 3 minutes as directed by time biomedical engineer. Additional pharmacologic interventions included calcium chloride and sodium bicarbonate. Additional procedural interventions include bedside echo.  Return of spontaneous circulation was achieved.  Family at bedside.   Complications/Tolerance N/A   EBL N/A   Specimen(s) N/A  Estimated time to ROSC: 7 minutes

## 2024-07-31 NOTE — Procedures (Signed)
 Central Venous Catheter Insertion Procedure Note  Saverio Kader  [id number]  [birth year]  Date:07/31/24  Time:10:26 AM   Provider Performing:Khadim Lundberg JAYSON Claudene   Procedure: Insertion of Non-tunneled Central Venous Catheter(36556) without US  guidance  Indication(s) Difficult access  Consent Unable to obtain consent due to emergent nature of procedure.  Anesthesia Topical only with 1% lidocaine    Timeout Verified patient identification, verified procedure, site/side was marked, verified correct patient position, special equipment/implants available, medications/allergies/relevant history reviewed, required imaging and test results available.  Sterile Technique Maximal sterile technique including full sterile barrier drape, hand hygiene, sterile gown, sterile gloves, mask, hair covering, sterile ultrasound probe cover (if used).  Procedure Description Area of catheter insertion was cleaned with chlorhexidine  and draped in sterile fashion.  Without real-time ultrasound guidance a central venous catheter was placed into the right subclavian vein. Nonpulsatile blood flow and easy flushing noted in all ports.  The catheter was sutured in place and sterile dressing applied.  Complications/Tolerance None; patient tolerated the procedure well. Chest X-ray is ordered to verify placement for internal jugular or subclavian cannulation.   Chest x-ray is not ordered for femoral cannulation.  EBL Minimal  Specimen(s) None

## 2024-07-31 NOTE — Progress Notes (Signed)
 Patient's chart was being reviewed this AM and Palliative Care Consultation was placed this AM. Was on the way to see and evaluate the patient but notified by the PCCM Physician Dr. Claudene that the patient rapidly decompensated approximately 15 minutes prior and had another PEA arrest/Asystole. PCCM Dr. Claudene was called to and at the bedside when this occurred and he received CPR and high quality chest compressions and intubated. The patient Subsequently achieved ROSC and was transferred to the ICU. The Hospitalist team was notified about the decompensation and PEA arrest after ROSC was achieved and after he was stabilized and placed on a Propofol  gtt. Abx were escalated from IV Unasyn to IV Meropenem and IV Vancomycin. Current plan is to place the patient on CRRT and Nephrology provider has been notified as well. At this time TRH will sign off the case as he remains intubated and will resume primary management of this patient once he is stable enough to be extubated and transferred out of the ICU.

## 2024-07-31 NOTE — Progress Notes (Signed)
 This RN received a call from CCMD, who reported patient had good pleth and was satting in the 70s for O2 saturation. This RN went into the patient's room immediately, and saw patient's wife attempting to suction patient with a yankeur. Patient's HR was approximately in the 60s. This RN palpated a weak carotid pulse. Charge nurse notified on radio, and this RN raised the [facility] while attempting to deep suction patient and obtained minimal output. This RN applied a nonrebreather, but patient sustained 60-70% O2 sat. Charge nurse came in and called trauma and rapid response. 4N ICU charge nurse came to bedside to assist. Patient went into PEA, compressions initiated and code blue activated.

## 2024-07-31 NOTE — Progress Notes (Signed)
 Patient with left-sided neck hematoma associated with his anterior cervical approach.  Patient with history of cardiac arrest today requiring resuscitation.  Currently hemodynamically stable.  The patient had been anticoagulated however.  At this point I do not believe that there is evidence of pseudoaneurysm or threat of massive hemorrhage.  I believe this is a complication of his anticoagulation.  As his airway is protected and as his posterior disc base was not violated during the time of his surgery I do not believe there is any indication for reexploration of his neck at this point.  This will hopefully soften and resolve over time.

## 2024-07-31 NOTE — Progress Notes (Unsigned)
 {  Select_TRH_Note:[id number]}

## 2024-07-31 NOTE — Plan of Care (Signed)
   Problem: Coping: Goal: Level of anxiety will decrease Outcome: Progressing   Problem: Elimination: Goal: Will not experience complications related to bowel motility Outcome: Progressing

## 2024-07-31 NOTE — Procedures (Signed)
 Central Venous Catheter Insertion Procedure Note  Nezar Buckles  [id number]  Nov 18, 1945  Date:07/31/24  Time:10:25 AM   Provider Performing:Somya Jauregui JAYSON Claudene   Procedure: Insertion of Non-tunneled Central Venous Catheter(36556) without US  guidance  Indication(s) Hemodialysis  Consent Unable to obtain consent due to emergent nature of procedure.  Anesthesia Topical only with 1% lidocaine    Timeout Verified patient identification, verified procedure, site/side was marked, verified correct patient position, special equipment/implants available, medications/allergies/relevant history reviewed, required imaging and test results available.  Sterile Technique Maximal sterile technique including full sterile barrier drape, hand hygiene, sterile gown, sterile gloves, mask, hair covering, sterile ultrasound probe cover (if used).  Procedure Description Area of catheter insertion was cleaned with chlorhexidine  and draped in sterile fashion.  Without real-time ultrasound guidance a HD catheter was placed into the left subclavian vein. Nonpulsatile blood flow and easy flushing noted in all ports.  The catheter was sutured in place and sterile dressing applied.  Complications/Tolerance None; patient tolerated the procedure well. Chest X-ray is ordered to verify placement for internal jugular or subclavian cannulation.   Chest x-ray is not ordered for femoral cannulation.  EBL Minimal  Specimen(s) None

## 2024-07-31 NOTE — Progress Notes (Signed)
 Trauma Event Note  Called by 4NP CRN about patient having an asystolic event per CCMD. On RN arrival to room, patient was sinus rhythm with a pulse, spontaneous respirations and SpO2 in the 60s. Initially this was thought to be a trauma patient by [facility] but I instructed CRN and bedside RN to called RR RN, place patient on non-rebreather and call a Code Blue should patient lose a pulse. On my way to the bedside, I called the 4NICU CRN for assistance who promptly assessed the patient. Shortly after her arrival patient became bradycardic and a Code Blue was initiated.   On my arrival, ACLS was ongoing - see Code Blue documentation. Patient intubated, ROSC achieved and patient transferred to the ICU.  Alan CROME Harly Pipkins  Trauma Response RN  Please call TRN at [phone] for further assistance.

## 2024-07-31 NOTE — Progress Notes (Addendum)
 eLink Physician-Brief Progress Note Patient Name: Lawrence [name] DOB: [birth year] MRN: [id number]   Date of Service  07/31/2024  HPI/Events of Note  Received query on insulin  order.    PT had nepro tube feeds running but stopped since code blue.  Pt has 50 units semglee BID that is due tonight.  Last glucose at 115.   eICU Interventions  Hold semglee.  Continue with insulin  sliding scale q4hrs.      Intervention Category Intermediate Interventions: Other:  Shanda Busman 07/31/2024, 9:27 PM  11:54 PM ABG ordered as per request with AM labs.

## 2024-07-31 NOTE — Progress Notes (Signed)
 Chaplain responds to code blue and provides compassionate presence and support to pt's wife Neville as medical team provides care to pt.Pt and Marilyn's son Alvaro arrives and chaplain offers encouragement. They then asked for time to themselves, which chaplain granted. Chaplain will follow through shift.

## 2024-07-31 NOTE — Progress Notes (Signed)
 NAME:  Lawrence [name], MRN:  [id number], DOB:  01/05/1946, LOS: 14 ADMISSION DATE:  07/16/2024, CONSULTATION DATE: 07/17/2024 REFERRING MD:  Debby Carrier , CHIEF COMPLAINT: Status postcardiac arrest  History of Present Illness:  78 year old male with diabetes, hypertension, BPH status post TURP who presented 10/25 after having a ground-level fall with recent onset of numbness in his lower extremity- complete loss of sensation to BLE & urinary incontinence after fall.  Neurosurgery was consulted, patient taken to OR for partial anterior cervical discectomy C7-T1 but course was complicated with multiple time PEA cardiac arrest in OR, procedure was aborted, TEE was done which showed severe RV dysfunction and dilatation with moderate to severe LV dysfunction, patient was transferred to ICU for close monitoring.    In ICU patient blood pressure started dropping and he went into PEA cardiac arrest again, ROSC was achieved after 2 minutes.  CTA PE study with extensive pulmonary artery thrombus bilaterally with evidence of right heart strain. Unfortunately due to surgery, it was recommended no TNK/TPA. 10/27 taken for thrombectomy.   Pertinent  Medical History   Past Medical History:  Diagnosis Date   Arthritis    Diabetes mellitus without complication (HCC)    GERD (gastroesophageal reflux disease)    Hypertension    Sleep apnea     Significant Hospital Events: Including procedures, antibiotic start and stop dates in addition to other pertinent events   Admitted and PCCM consulted, coded multiple times in OR (PEA) 07/18/2024 mechanical thrombectomy was done for bilateral pulmonary embolism. 10/28 nephro consult 10/29 on 5 NE, plan for HD cath placement and CRRT  10/30 add midodrine  11/3 remain on CRRT, came off of vasopressors and inotropic support. 11/4 CRRT was stopped, not making much urine 11/5  Made 50 cc of urine, serum creatinine and potassium trended up 11/9 recurrent arrest on  floor  Interim History / Subjective:  See separate code blue note. Talked to nurse and family: seems like more somnolent over last couple days Bradypneic this am then PEA x7  mins  Objective    Blood pressure (!) 134/42, pulse 91, temperature (!) 102.6 F (39.2 C), temperature source Axillary, resp. rate 18, height 6' 1 (1.854 m), weight 76.6 kg, SpO2 93%.    Vent Mode: PRVC FiO2 (%):  [100 %] 100 % Set Rate:  [22 bmp] 22 bmp Vt Set:  [580 mL] 580 mL PEEP:  [8 cmH20] 8 cmH20   Intake/Output Summary (Last 24 hours) at 07/31/2024 9096 Last data filed at 07/30/2024 1900 Gross per 24 hour  Intake 1123.56 ml  Output 3170 ml  Net -2046.44 ml   Filed Weights   07/30/24 1015 07/30/24 1411 07/31/24 0500  Weight: 76.5 kg 74.8 kg 76.6 kg    Examination: Unresponsive Pupils small equal Starting to trigger vent Tachy, SVT type on monitor after epi Good pulses Bedside echo doesn't look too bad  Labs all pending  Resolved problem list  Lactic acidosis  Endotracheally intubated Bilateral multifocal pneumonia Cardiogenic shock Acute respiratory failure with hypoxia and hypercapnia  Assessment and Plan  Status post PEA cardiac arrest in the setting of bilateral massive PE status post mechanical thrombectomy Acute RV failure, improving Rib fractures in the setting of CPR Status post mechanical fall with C7/T1 spinal cord injury and displaced C7 vertebral fracture 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  Short downtime so think he can bounce back but doesn't solve the other  issues.  - Remove femoral HD line, contaminated with stool - Broaden abx to vanc/meropenem - Unfortunately need to pan-culture - Place new HD line - Sedation with prop/fent - Once wean to extubate need to do mandatory noctural BIPAP for foreseeble future - May need to do CRRT for next couple days while we let dust settle - Sending usual post arrest labs and formal echo check - If H/H okay can resume heparin   Guarded prognosis Family updated, I know them from admission  33 min cc time independent of procedures Rolan Sharps MD PCCM

## 2024-07-31 NOTE — Progress Notes (Signed)
 PHARMACY - ANTICOAGULATION and ANTIBIOTIC CONSULT NOTE  Pharmacy Consult for heparin  Indication: pulmonary embolus  Allergies  Allergen Reactions   Penicillins Hives   Lucentis [Ranibizumab] Rash    Patient Measurements: Height: 6' 1 (185.4 cm) Weight: 76.6 kg (168 lb 14 oz) IBW/kg (Calculated) : 79.9 HEPARIN  DW (KG): 81.8  Vital Signs: Temp: 102.6 F (39.2 C) (11/09 0700) Temp Source: Axillary (11/09 0700) BP: 134/42 (11/09 0700) Pulse Rate: 91 (11/09 0700)  Labs: Recent Labs    07/29/24 0231 07/29/24 0659 07/29/24 1357 07/29/24 1554 07/30/24 0550 07/30/24 0839 07/31/24 0434  HGB 8.1*  --  8.7*  --   --  7.4* 7.3*  HCT 25.5*  --  26.7*  --   --  23.0* 22.9*  PLT 234  --  252  --   --  250 270  HEPARINUNFRC 0.51  --   --   --  0.30  --  0.29*  CREATININE 5.07*   < >  --  3.66* 4.72*  --  4.40*   < > = values in this interval not displayed.    Estimated Creatinine Clearance: 15.5 mL/min (A) (by C-G formula based on SCr of 4.4 mg/dL (H)).   Assessment: Lawrence [name] is a 48 YOM admitted with concern for spinal cord injury. Patient taken to the OR for neurosurgery, procedure complicated by multiple rounds of CPR.   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 S/p IR 10/27 for thrombectomy.  Heparin  level subtherapeutic this AM at 0.29 with heparin  running at 1200 units/hr.  Hgb stable (7-8).Platelets are within normal limits.  Heparin  was paused around mid-morning after code event in order to replace lines. Planning to restart in the next 1-2 hours.   Goal of Therapy:  Heparin  level 0.3-0.7 units/ml Monitor platelets by anticoagulation protocol: Yes   Plan:  Increase heparin  infusion at 1250 units/hr, patient had been therapeutic on 1200u/hr while previously on CRRT.  8 hour heparin  level, daily CBC, and monitor for s/sx of bleeding.   Pharmacy Antibiotic Note  Lawrence [name] is a 78 y.o. male admitted on 07/16/2024  with bacteremia and sepsis.  Pharmacy has been consulted for vancomycin and meropenem dosing. Patient starting CRRT this afternoon from iHD.   Plan: Vancomycin 1750mg  x1 given as a loading dose, followed by vancomycin 1000mg  q24h.  Meropenem 1g q8h.  Follow culture data for de-escalation.  Monitor renal function for dose adjustments as indicated.  Height: 6' 1 (185.4 cm) Weight: 76.6 kg (168 lb 14 oz) IBW/kg (Calculated) : 79.9  Temp (24hrs), Avg:100.2 F (37.9 C), Min:98.6 F (37 C), Max:102.6 F (39.2 C)  Recent Labs  Lab 07/28/24 0434 07/28/24 1517 07/29/24 0231 07/29/24 0659 07/29/24 1357 07/29/24 1554 07/30/24 0550 07/30/24 0839 07/31/24 0434  WBC 17.7*  --  23.3*  --  23.5*  --   --  16.2* 11.4*  CREATININE 3.88*   < > 5.07* 5.33*  --  3.66* 4.72*  --  4.40*   < > = values in this interval not displayed.    Estimated Creatinine Clearance: 15.5 mL/min (A) (by C-G formula based on SCr of 4.4 mg/dL (H)).    Allergies  Allergen Reactions   Penicillins Hives   Lucentis [Ranibizumab] Rash    Antimicrobials this admission: Ceftriaxone 10/29 >> 11/2, 11/8 >> 11/9 Flagyl 11/8 >> 11/9  Cefepime 10/27 >> 10/29 Meropenem 11/9 >>  Vancomycin 11/9 >>  Microbiology results: 11/8 BCx:  11/8 UCx:   11/9  Sputum:   10/26 MRSA PCR:   Thank you for allowing pharmacy to be a part of this patient's care.  Powell Blush, PharmD, BCCCP  07/31/2024 9:43 AM

## 2024-07-31 NOTE — Progress Notes (Signed)
 RT x 3 responded to code blue.  2xRT bagged pt and prepared for intubation. CRNA intubated and RT bagged pt until 4N24 was ready for pt transport. Pt transported without event.

## 2024-07-31 NOTE — Progress Notes (Signed)
 Hematoma near incision site. Slow growing nonpulsatile doesn't feel like pseudoaneurysm. If continues to get larger may ask vascular to take a look at with ultrasound. Notified nsgy. Airway protected. On CRRT. Lactate reassuring. Family updated

## 2024-07-31 NOTE — Progress Notes (Signed)
 Echocardiogram Attempted 2D Echocardiogram at 9:22am, DR. Claudene requested to come back in the afternoon to do echo.  Iyana Topor N Roper Tolson,RDCS 07/31/2024, 9:24 AM

## 2024-07-31 NOTE — Progress Notes (Signed)
 Echocardiogram 2D Echocardiogram limited with echo Definity has been performed.  Telitha Plath N Onie Hayashi,RDCS 07/31/2024, 1:32 PM

## 2024-07-31 NOTE — Anesthesia Procedure Notes (Signed)
 Procedure Name: Intubation Date/Time: 07/31/2024 8:42 AM  Performed by: Claudene Florina Boga, CRNAPre-anesthesia Checklist: Patient identified, Emergency Drugs available, Suction available and Patient being monitored Patient Re-evaluated:Patient Re-evaluated prior to induction Oxygen Delivery Method: Ambu bag Preoxygenation: Pre-oxygenation with 100% oxygen Ventilation: Mask ventilation without difficulty Laryngoscope Size: Glidescope and 4 Grade View: Grade I Tube type: Oral Tube size: 7.5 mm Number of attempts: 1 Airway Equipment and Method: Rigid stylet and Video-laryngoscopy Placement Confirmation: ETT inserted through vocal cords under direct vision, positive ETCO2 and breath sounds checked- equal and bilateral Secured at: 24 cm Tube secured with: Tape Dental Injury: Teeth and Oropharynx as per pre-operative assessment  Difficulty Due To: Difficult Airway- due to cervical collar Comments: Elective glidescope intubation d/t cervical collar in place, active code situation.

## 2024-08-01 ENCOUNTER — Inpatient Hospital Stay (HOSPITAL_COMMUNITY)

## 2024-08-01 DIAGNOSIS — R569 Unspecified convulsions: Secondary | ICD-10-CM

## 2024-08-01 DIAGNOSIS — E875 Hyperkalemia: Secondary | ICD-10-CM | POA: Diagnosis not present

## 2024-08-01 DIAGNOSIS — G8220 Paraplegia, unspecified: Secondary | ICD-10-CM | POA: Diagnosis not present

## 2024-08-01 DIAGNOSIS — N179 Acute kidney failure, unspecified: Secondary | ICD-10-CM | POA: Diagnosis not present

## 2024-08-01 DIAGNOSIS — D62 Acute posthemorrhagic anemia: Secondary | ICD-10-CM

## 2024-08-01 DIAGNOSIS — I469 Cardiac arrest, cause unspecified: Secondary | ICD-10-CM | POA: Diagnosis not present

## 2024-08-01 LAB — RENAL FUNCTION PANEL
Albumin: 2.6 g/dL — ABNORMAL LOW (ref 3.5–5.0)
Albumin: 3 g/dL — ABNORMAL LOW (ref 3.5–5.0)
Anion gap: 15 (ref 5–15)
Anion gap: 13 (ref 5–15)
BUN: 61 mg/dL — ABNORMAL HIGH (ref 8–23)
BUN: 43 mg/dL — ABNORMAL HIGH (ref 8–23)
CO2: 21 mmol/L — ABNORMAL LOW (ref 22–32)
CO2: 26 mmol/L (ref 22–32)
Calcium: 8.9 mg/dL (ref 8.9–10.3)
Calcium: 9.2 mg/dL (ref 8.9–10.3)
Chloride: 101 mmol/L (ref 98–111)
Chloride: 97 mmol/L — ABNORMAL LOW (ref 98–111)
Creatinine, Ser: 2.76 mg/dL — ABNORMAL HIGH (ref 0.61–1.24)
Creatinine, Ser: 2 mg/dL — ABNORMAL HIGH (ref 0.61–1.24)
GFR, Estimated: 23 mL/min — ABNORMAL LOW (ref >60)
GFR, Estimated: 34 mL/min — ABNORMAL LOW (ref >60)
Glucose, Bld: 88 mg/dL (ref 70–99)
Glucose, Bld: 161 mg/dL — ABNORMAL HIGH (ref 70–99)
Phosphorus: 4.4 mg/dL (ref 2.5–4.6)
Phosphorus: 4.2 mg/dL (ref 2.5–4.6)
Potassium: 3.5 mmol/L (ref 3.5–5.1)
Potassium: 3.7 mmol/L (ref 3.5–5.1)
Sodium: 137 mmol/L (ref 135–145)
Sodium: 136 mmol/L (ref 135–145)

## 2024-08-01 LAB — POCT I-STAT 7, (LYTES, BLD GAS, ICA,H+H)
Acid-Base Excess: 0 mmol/L (ref 0.0–2.0)
Acid-Base Excess: 1 mmol/L (ref 0.0–2.0)
Bicarbonate: 21.8 mmol/L (ref 20.0–28.0)
Bicarbonate: 24.4 mmol/L (ref 20.0–28.0)
Calcium, Ion: 1.18 mmol/L (ref 1.15–1.40)
Calcium, Ion: 1.29 mmol/L (ref 1.15–1.40)
HCT: 20 % — ABNORMAL LOW (ref 39.0–52.0)
HCT: 18 % — ABNORMAL LOW (ref 39.0–52.0)
Hemoglobin: 6.8 g/dL — CL (ref 13.0–17.0)
Hemoglobin: 6.1 g/dL — CL (ref 13.0–17.0)
O2 Saturation: 100 %
O2 Saturation: 100 %
Patient temperature: 36.5
Patient temperature: 36.1
Potassium: 3.5 mmol/L (ref 3.5–5.1)
Potassium: 3.3 mmol/L — ABNORMAL LOW (ref 3.5–5.1)
Sodium: 136 mmol/L (ref 135–145)
Sodium: 137 mmol/L (ref 135–145)
TCO2: 22 mmol/L (ref 22–32)
TCO2: 25 mmol/L (ref 22–32)
pCO2 arterial: 21.7 mmHg — ABNORMAL LOW (ref 32–48)
pCO2 arterial: 29.9 mmHg — ABNORMAL LOW (ref 32–48)
pH, Arterial: 7.608 (ref 7.35–7.45)
pH, Arterial: 7.517 — ABNORMAL HIGH (ref 7.35–7.45)
pO2, Arterial: 198 mmHg — ABNORMAL HIGH (ref 83–108)
pO2, Arterial: 153 mmHg — ABNORMAL HIGH (ref 83–108)

## 2024-08-01 LAB — DIC (DISSEMINATED INTRAVASCULAR COAGULATION)PANEL
D-Dimer, Quant: >20 ug{FEU}/mL — ABNORMAL HIGH (ref 0.00–0.50)
Fibrinogen: 767 mg/dL — ABNORMAL HIGH (ref 210–475)
INR: 1.3 — ABNORMAL HIGH (ref 0.8–1.2)
Platelets: 210 K/uL (ref 150–400)
Prothrombin Time: 16.6 s — ABNORMAL HIGH (ref 11.4–15.2)
Smear Review: NONE SEEN
aPTT: 38 s — ABNORMAL HIGH (ref 24–36)

## 2024-08-01 LAB — CBC
HCT: 17.1 % — ABNORMAL LOW (ref 39.0–52.0)
HCT: 22.5 % — ABNORMAL LOW (ref 39.0–52.0)
Hemoglobin: 5.4 g/dL — CL (ref 13.0–17.0)
Hemoglobin: 7.3 g/dL — ABNORMAL LOW (ref 13.0–17.0)
MCH: 31.4 pg (ref 26.0–34.0)
MCH: 30.7 pg (ref 26.0–34.0)
MCHC: 31.6 g/dL (ref 30.0–36.0)
MCHC: 32.4 g/dL (ref 30.0–36.0)
MCV: 99.4 fL (ref 80.0–100.0)
MCV: 94.5 fL (ref 80.0–100.0)
Platelets: 221 K/uL (ref 150–400)
Platelets: 207 K/uL (ref 150–400)
RBC: 1.72 MIL/uL — ABNORMAL LOW (ref 4.22–5.81)
RBC: 2.38 MIL/uL — ABNORMAL LOW (ref 4.22–5.81)
RDW: 18.7 % — ABNORMAL HIGH (ref 11.5–15.5)
RDW: 18.7 % — ABNORMAL HIGH (ref 11.5–15.5)
WBC: 8.3 K/uL (ref 4.0–10.5)
WBC: 9.1 K/uL (ref 4.0–10.5)
nRBC: 0.2 % (ref 0.0–0.2)
nRBC: 0.2 % (ref 0.0–0.2)

## 2024-08-01 LAB — HEMOGLOBIN AND HEMATOCRIT, BLOOD
HCT: 25.6 % — ABNORMAL LOW (ref 39.0–52.0)
Hemoglobin: 8.3 g/dL — ABNORMAL LOW (ref 13.0–17.0)

## 2024-08-01 LAB — MRSA NEXT GEN BY PCR, NASAL: MRSA by PCR Next Gen: NOT DETECTED

## 2024-08-01 LAB — GLUCOSE, CAPILLARY
Glucose-Capillary: 100 mg/dL — ABNORMAL HIGH (ref 70–99)
Glucose-Capillary: 80 mg/dL (ref 70–99)
Glucose-Capillary: 87 mg/dL (ref 70–99)
Glucose-Capillary: 135 mg/dL — ABNORMAL HIGH (ref 70–99)
Glucose-Capillary: 158 mg/dL — ABNORMAL HIGH (ref 70–99)
Glucose-Capillary: 196 mg/dL — ABNORMAL HIGH (ref 70–99)

## 2024-08-01 LAB — TRIGLYCERIDES: Triglycerides: 78 mg/dL (ref <150)

## 2024-08-01 LAB — AMMONIA: Ammonia: 16 umol/L (ref 9–35)

## 2024-08-01 LAB — PREPARE RBC (CROSSMATCH)

## 2024-08-01 MED ORDER — SODIUM CHLORIDE 0.9 % IV SOLN
20.0000 ug | Freq: Once | INTRAVENOUS | Status: AC
  Administered 2024-08-01: 20 ug via INTRAVENOUS
  Filled 2024-08-01: qty 5

## 2024-08-01 MED ORDER — PRISMASOL BGK 4/2.5 32-4-2.5 MEQ/L EC SOLN: Status: DC

## 2024-08-01 MED ORDER — SODIUM CHLORIDE 0.9% IV SOLUTION: Freq: Once | INTRAVENOUS | Status: DC

## 2024-08-01 MED ORDER — PROSOURCE TF20 ENFIT COMPATIBL EN LIQD
60.0000 mL | Freq: Two times a day (BID) | ENTERAL | Status: DC
  Administered 2024-08-01 – 2024-08-02 (×3): 60 mL
  Filled 2024-08-01 (×3): qty 60

## 2024-08-01 MED ORDER — ALBUMIN HUMAN 25 % IV SOLN
25.0000 g | Freq: Once | INTRAVENOUS | Status: AC
  Administered 2024-08-01: 25 g via INTRAVENOUS
  Filled 2024-08-01: qty 100

## 2024-08-01 MED ORDER — NEPRO/CARBSTEADY PO LIQD
1000.0000 mL | ORAL | Status: DC
  Administered 2024-08-01 – 2024-08-02 (×2): 1000 mL
  Filled 2024-08-01: qty 1000

## 2024-08-01 MED FILL — Sodium Chloride IV Soln 0.9%: INTRAVENOUS | Qty: 250 | Status: AC

## 2024-08-01 MED FILL — Fentanyl Citrate Preservative Free (PF) Inj 2500 MCG/50ML: INTRAMUSCULAR | Qty: 50 | Status: AC

## 2024-08-01 NOTE — Progress Notes (Signed)
 OT Cancellation Note  Patient Details Name: Lawrence [name] MRN: [id number] DOB: 03/14/1946   Cancelled Treatment:    Reason Eval/Treat Not Completed: Patient not medically ready (Pt with recent code, intubation and transfer to ICU. RASS -4. OT to f/u once pt is medically stable to participate with therapies.)  Angelin Cutrone D Causey 08/01/2024, 7:55 AM

## 2024-08-01 NOTE — Progress Notes (Signed)
 Patient transported to and from CT scan with no issues.  Will continue to monitor.

## 2024-08-01 NOTE — Progress Notes (Signed)
 [facility] NEPHROLOGY PROGRESS NOTE  Assessment/ Plan: Pt is a 78 y.o. yo male with past medical history significant for hypertension, diabetes, BPH status post TURP presented after the fall status post cervical discectomy C7-T1 complicated by multiple PEA and AKI.  # Acute kidney injury likely ischemic ATN plus CIN, multiple episodes of PEA arrest, remains anuric.  Started CRRT on 10/29 which was paused on 11/4 with IHD and later restarted on CRRT. -Change bath to 4K, continue CRRT, no net UF.  Discussed with the patient's daughter and ICU nurse. - Continue strict ins and out and close lab monitoring.  # Cardiac arrest: Managing by critical care team.  # Hyperkalemia treating with CRRT.  Noted potassium level is low therefore adjusting potassium bath today.  Monitor lab.  # Vent dependent respiratory failure, managing with pulmonary team.  # Cardiogenic shock due to pulmonary embolus, currently on Levophed, monitor BP.  # Anemia of critical illness: Receiving 2 units of blood transfusion today.  Subjective: Seen and examined in ICU.  Patient is intubated, sedated and on Levophed.  His daughter is at the bedside.  No significant urine output.  Tolerating CRRT well. Objective Vital signs in last 24 hours: Vitals:   08/01/24 1230 08/01/24 1235 08/01/24 1246 08/01/24 1301  BP: (!) 133/53  (!) 137/54 (!) 130/48  Pulse: 70 69 70 71  Resp: 17 16 16 17   Temp:  (!) 97.3 F (36.3 C)  (!) 97.3 F (36.3 C)  TempSrc:  Esophageal    SpO2: 100% 100% 100%   Weight:      Height:       Weight change: -6.9 kg  Intake/Output Summary (Last 24 hours) at 08/01/2024 1320 Last data filed at 08/01/2024 1300 Gross per 24 hour  Intake 3138.86 ml  Output 2897.5 ml  Net 241.36 ml       Labs: RENAL PANEL Recent Labs  Lab 07/27/24 0525 07/27/24 1815 07/28/24 0434 07/28/24 1517 07/29/24 0231 07/29/24 0659 07/29/24 1554 07/29/24 1835 07/30/24 0550 07/30/24 0553  07/30/24 0839 07/31/24 0434 07/31/24 0950 07/31/24 1448 08/01/24 0500 08/01/24 0521 08/01/24 1012  NA 134*   < > 133*   < > 132*   < > 133*  --  136  --   --  134* 132* 134* 137 136 137  K 5.6*   < > 5.4*   < > 6.4*   < > 5.7*   < > 5.8* 5.7*   < > 5.2* 5.4* 5.3* 3.5 3.5 3.3*  CL 97*   < > 95*   < > 95*   < > 95*  --  93*  --   --  94*  --  96* 101  --   --   CO2 22   < > 22   < > 20*   < > 21*  --  25  --   --  24  --  21* 21*  --   --   GLUCOSE 258*   < > 160*   < > 156*   < > 216*  --  131*  --   --  140*  --  194* 88  --   --   BUN 105*   < > 93*   < > 143*   < > 81*  --  113*  --   --  92*  --  107* 61*  --   --   CREATININE 3.95*   < > 3.88*   < >  5.07*   < > 3.66*  --  4.72*  --   --  4.40*  --  4.70* 2.76*  --   --   CALCIUM 8.9   < > 8.5*   < > 8.6*   < > 8.2*  --  8.6*  --   --  8.6*  --  8.5* 8.9  --   --   MG 2.6*  --  2.5*  --  2.7*  --   --   --   --  2.3  --  2.3  --   --   --   --   --   PHOS 3.6   < > 5.9*   < > 6.8*  --  5.9*  --  6.3*  --   --  6.0*  --  6.6* 4.4  --   --   ALBUMIN 2.3*   < > 2.3*   < > 2.4*  --  2.3*  --  2.1*  --   --  1.8*  --  2.0* 2.6*  --   --    < > = values in this interval not displayed.    Liver Function Tests: Recent Labs  Lab 07/25/24 1635 07/26/24 0354 07/31/24 0434 07/31/24 1448 08/01/24 0500  AST 31  --   --   --   --   ALT 128*  --   --   --   --   ALKPHOS 79  --   --   --   --   BILITOT 1.0  --   --   --   --   PROT 5.7*  --   --   --   --   ALBUMIN 2.1*  2.1*   < > 1.8* 2.0* 2.6*   < > = values in this interval not displayed.   No results for input(s): LIPASE, AMYLASE in the last 168 hours. Recent Labs  Lab 07/28/24 0155  AMMONIA 19   CBC: Recent Labs    07/31/24 0434 07/31/24 0950 08/01/24 0500 08/01/24 0521 08/01/24 1012  HGB 7.3* 7.5* 5.4* 6.8* 6.1*  MCV 99.6  --  99.4  --   --     Cardiac Enzymes: No results for input(s): CKTOTAL, CKMB, CKMBINDEX, TROPONINI in the last 168  hours. CBG: Recent Labs  Lab 07/31/24 1937 07/31/24 2321 08/01/24 0321 08/01/24 0712 08/01/24 1109  GLUCAP 115* 106* 100* 80 87    Iron Studies: No results for input(s): IRON, TIBC, TRANSFERRIN, FERRITIN in the last 72 hours. Studies/Results: DG CHEST PORT 1 VIEW Result Date: 07/31/2024 CLINICAL DATA:  Shortness of breath, fever. EXAM: PORTABLE CHEST 1 VIEW COMPARISON:  07/30/2024 and CT chest 07/17/2024. FINDINGS: Patient is slightly rotated. Trachea is midline. Heart size stable. Feeding tube is followed into the stomach with the tip projecting beyond the inferior margin of the image. Minimal streaky densities in the lower lobes. No airspace consolidation or pleural fluid. IMPRESSION: Mild bibasilar atelectasis. Electronically Signed   By: Newell Eke M.D.   On: 07/31/2024 15:26   ECHOCARDIOGRAM LIMITED Result Date: 07/31/2024    ECHOCARDIOGRAM LIMITED REPORT   Patient Name:   NASON CONRADT Date of Exam: 07/31/2024 Medical Rec #:  [id number]     Height:       73.0 in Accession #:    [id number]    Weight:       168.9 lb Date of Birth:  03/14/1946     BSA:  2.002 m Patient Age:    78 years      BP:           134/42 mmHg Patient Gender: M             HR:           76 bpm. Exam Location:  Inpatient Procedure: Limited Echo, Color Doppler and Intracardiac Opacification Agent            (Both Spectral and Color Flow Doppler were utilized during            procedure). Indications:     Cardiac Arrest  History:         Patient has prior history of Echocardiogram examinations, most                  recent 07/19/2024. Abnormal ECG, Pulmonary Emoblus;                  Arrythmias:Cardiac Arrest.  Sonographer:     Logan Shove RDCS Referring Phys:  [id number] TORIBIO JAYSON SHARPS Diagnosing Phys: Dorn Ross MD IMPRESSIONS  1. Left ventricular ejection fraction, by estimation, is 70 to 75%. The left ventricle has hyperdynamic function. Left ventricular endocardial border not optimally defined to  evaluate regional wall motion. There is moderate left ventricular hypertrophy.  2. RV not well visualized, grossly appears normal in size and function. . Right ventricular systolic function was not well visualized. The right ventricular size is not well visualized.  3. Limited echocardiogram FINDINGS  Left Ventricle: Left ventricular ejection fraction, by estimation, is 70 to 75%. The left ventricle has hyperdynamic function. Left ventricular endocardial border not optimally defined to evaluate regional wall motion. Definity contrast agent was given IV to delineate the left ventricular endocardial borders. There is moderate left ventricular hypertrophy. Right Ventricle: RV not well visualized, grossly appears normal in size and function. The right ventricular size is not well visualized. Right vetricular wall thickness was not well visualized. Right ventricular systolic function was not well visualized. Pericardium: There is no evidence of pericardial effusion. Venous: IVC assessment for right atrial pressure unable to be performed due to mechanical ventilation. LEFT VENTRICLE PLAX 2D LVIDd:         3.80 cm LVIDs:         2.40 cm LV PW:         1.10 cm LV IVS:        1.40 cm  LV Volumes (MOD) LV vol d, MOD A2C: 105.0 ml LV vol d, MOD A4C: 68.7 ml LV vol s, MOD A2C: 36.7 ml LV vol s, MOD A4C: 25.8 ml LV SV MOD A2C:     68.3 ml LV SV MOD A4C:     68.7 ml LV SV MOD BP:      57.6 ml IVC IVC diam: 2.30 cm LEFT ATRIUM           Index LA diam:      2.30 cm 1.15 cm/m LA Vol (A2C): 14.1 ml 7.04 ml/m LA Vol (A4C): 39.9 ml 19.93 ml/m   AORTA Ao Root diam: 3.40 cm Dorn Ross MD Electronically signed by Dorn Ross MD Signature Date/Time: 07/31/2024/2:41:13 PM    Final (Updated)    CT CHEST ABDOMEN PELVIS WO CONTRAST Result Date: 07/31/2024 EXAM: CT CHEST, ABDOMEN AND PELVIS WITHOUT CONTRAST 07/31/2024 11:20:34 AM TECHNIQUE: CT of the chest, abdomen and pelvis was performed without the administration of intravenous  contrast. Multiplanar reformatted images are provided for review. Automated  exposure control, iterative reconstruction, and/or weight based adjustment of the mA/kV was utilized to reduce the radiation dose to as low as reasonably achievable. COMPARISON: CTA chest 07/17/2024. CLINICAL HISTORY: 78 year old male. Status post aborted C7-T1 ACDF. Suspected left neck hematoma. Sepsis. FINDINGS: CHEST: MEDIASTINUM AND LYMPH NODES: Left anterior lower neck skin staples, near the thoracic inlet. Underlying homogeneous intermediate to slightly hyperdense masslike collection measures slightly larger than 69 x 36 x 43 mm (AP x transverse x CC). Not completely included., with visible Estimated volume: 56 mL. Mild rightward mass effect on the trachea and thyroid at the thoracic inlet. Intubated, endotracheal tube tip above the clavicles. Small volume retained secretions in the trachea and at the tip of the endotracheal tube. Major airways remain patent. Enteric tube within the esophagus terminates in the distal stomach. Bilateral subclavian approach vascular catheters terminate in the superior vena cava. Normal heart size. No pericardial effusion. No mediastinal, hilar or axillary lymphadenopathy. LUNGS AND PLEURA: Moderate to severe right lower lobe consolidation with air bronchograms. Virtually complete lobar involvement. Contralateral posterior basal segment left lower lobe consolidation, with additional patchy peribronchial opacity in the superior segment. Small layering left pleural effusion. No significant right pleural effusion. Upper lobes and middle lobes essentially clear now. No pneumothorax. ABDOMEN AND PELVIS: LIVER: The liver is unremarkable. GALLBLADDER AND BILE DUCTS: Vicarious contrast excretion to the gallbladder. No biliary ductal dilatation. SPLEEN: No acute abnormality. PANCREAS: No acute abnormality. ADRENAL GLANDS: No acute abnormality. KIDNEYS, URETERS AND BLADDER: Diminutive urinary bladder. No stones in  the kidneys or ureters. No hydronephrosis. No perinephric or periureteral stranding. GI AND BOWEL: Small volume of oral contrast in the gastric antrum. Fluid containing but largely decompressed small and large bowel loops throughout the abdomen and pelvis. Rectal tube is in place. No bowel obstruction. REPRODUCTIVE ORGANS: No acute abnormality. PERITONEUM AND RETROPERITONEUM: No ascites. No free air. No pelvis free fluid. VASCULATURE: Normal caliber abdominal aorta. Mild free calcified abdominal aortic atherosclerosis. ABDOMINAL AND PELVIS LYMPH NODES: No lymphadenopathy. BONES AND SOFT TISSUES: Thoracic spine hyperostosis with flowing osteophytes and multilevel subsequent interbody ankylosis. No acute osseous abnormality. IMPRESSION: 1. Right > lower lobe consolidative Pneumonia. Small layering left pleural effusion. 2. Left anterior lower neck collection underlying skin staples appears to be hematoma, with estimated volume slightly greater than 56 mL. Mild rightward mass effect on the trachea and thyroid at the thoracic inlet. 3. Satisfactory endotracheal tube, enteric tube terminating in the distal stomach, satisfactory rectal tube, and bilateral subclavian vascular catheters terminating in the SVC Electronically signed by: Helayne Hurst MD 07/31/2024 12:00 PM EST RP Workstation: [id number]   DG Chest Port 1 View Result Date: 07/31/2024 EXAM: 1 VIEW(S) XRAY OF THE CHEST 07/31/2024 11:23:00 AM COMPARISON: 07/31/2024 CLINICAL HISTORY: Encounter for central line placement [id number] FINDINGS: LINES, TUBES AND DEVICES: Left central venous catheter in place with tip over the SVC. Right central venous catheter in place with tip over the region of the confluence of the subclavian and brachiocephalic veins. ETT in place with tip 3.5 cm above the carina. Enteric tube courses through the chest to the abdomen beyond the field-of-view. LUNGS AND PLEURA: Progressive opacities within the right lower lobe concerning for pneumonia.  No pulmonary edema. No pleural effusion. No pneumothorax. HEART AND MEDIASTINUM: No acute abnormality of the cardiac and mediastinal silhouettes. BONES AND SOFT TISSUES: Surgical staples noted over the lower neck. No acute osseous abnormality. IMPRESSION: 1. Progressive right lower lobe airspace opacity concerning for pneumonia or aspiration. Electronically signed by:  Waddell Calk MD 07/31/2024 11:54 AM EST RP Workstation: [id number]   CT HEAD WO CONTRAST ( ) Result Date: 07/31/2024 EXAM: CT HEAD WITHOUT CONTRAST 07/31/2024 11:20:34 AM TECHNIQUE: CT of the head was performed without the administration of intravenous contrast. Automated exposure control, iterative reconstruction, and/or weight based adjustment of the mA/kV was utilized to reduce the radiation dose to as low as reasonably achievable. COMPARISON: 07/28/2024 CLINICAL HISTORY: Delirium FINDINGS: BRAIN AND VENTRICLES: No acute hemorrhage. No evidence of acute infarct. No hydrocephalus. No extra-axial collection. No mass effect or midline shift. Periventricular white matter changes, likely sequela of chronic small vessel ischemic disease. Age-related cerebral atrophy. 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 ORBITS: No acute abnormality. Bilateral lens replacements. SINUSES: Scattered mucosal thickening in the paranasal sinuses. Trace fluid in the right mastoid air cells. Small amount of fluid layering in the nasopharynx. SOFT TISSUES AND SKULL: No acute soft tissue abnormality. No skull fracture. Nasogastric tube in place. IMPRESSION: 1. No acute intracranial abnormality. 2. 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 3. Similar chronic small vessel ischemic changes. 4. Age-related cerebral atrophy. Electronically signed by: Donnice Mania MD 07/31/2024 11:51 AM EST RP Workstation: [id number]   DG Chest Port 1 View Result Date: 07/31/2024 EXAM: 1 VIEW(S) XRAY OF THE CHEST 07/31/2024 09:01:00 AM COMPARISON: 07/30/2024 CLINICAL  HISTORY: History of ETT [id number]. FINDINGS: LINES, TUBES AND DEVICES: Endotracheal tube in place with tip 4 cm above the carina. Enteric tube in place, courses below the hemidiaphragm with tip collimated off view. Cardiac paddles noted. LUNGS AND PLEURA: Persistent right lower lobe airspace opacity. No pulmonary edema. No pleural effusion. No pneumothorax. HEART AND MEDIASTINUM: No acute abnormality of the cardiac and mediastinal silhouettes. BONES AND SOFT TISSUES: Surgical staples in lower neck. No acute osseous abnormality. IMPRESSION: 1. PersistentPersistent right lower lobe airspace opacity, suspicious for pneumonia or aspiration. Electronically signed by: Waddell Calk MD 07/31/2024 09:10 AM EST RP Workstation: [id number]    Medications: Infusions:  sodium chloride  Stopped (08/01/24 1259)   desmopressin (DDAVP) 20 mcg in sodium chloride  0.9 % 50 mL IVPB 20 mcg (08/01/24 1307)   feeding supplement (NEPRO CARB STEADY) 50 mL/hr at 08/01/24 1300   fentaNYL  infusion INTRAVENOUS 75 mcg/hr (08/01/24 1300)   meropenem (MERREM) IV 200 mL/hr at 08/01/24 1300   norepinephrine (LEVOPHED) Adult infusion 3 mcg/min (08/01/24 1300)   prismasol BGK 4/2.5     prismasol BGK 4/2.5     prismasol BGK 4/2.5     propofol  (DIPRIVAN ) infusion Stopped (08/01/24 9166)   vancomycin 1,000 mg (08/01/24 1304)    Scheduled Medications:  sodium chloride    Intravenous Once   Chlorhexidine  Gluconate Cloth  6 each Topical Q0600   docusate  100 mg Per Tube BID   feeding supplement (PROSource TF20)  60 mL Per Tube BID   guaiFENesin  5 mL Per Tube Q6H   insulin  aspart  0-20 Units Subcutaneous Q4H   multivitamin  1 tablet Per Tube QHS   mouth rinse  15 mL Mouth Rinse Q2H   polyethylene glycol  17 g Per Tube BID   senna-docusate  2 tablet Per Tube QHS   sertraline  50 mg Per Tube Daily   sodium chloride  flush  3 mL Intravenous Q12H    have reviewed scheduled and prn medications.  Physical Exam: General: Critically ill  looking male, intubated, sedated. Heart:RRR, s1s2 nl Lungs: Coarse breath sound bilateral Abdomen:soft, non-distended Extremities:No edema Dialysis Access: temp HD line.    Bazil Dhanani Amelie Romney 08/01/2024,1:20  PM  LOS: 15 days

## 2024-08-01 NOTE — Plan of Care (Signed)
   Problem: Nutrition: Goal: Adequate nutrition will be maintained Outcome: Progressing

## 2024-08-01 NOTE — Progress Notes (Signed)
 eLink Physician-Brief Progress Note Patient Name: Lawrence [name] DOB: [birth year] MRN: [id number]   Date of Service  08/01/2024  HPI/Events of Note  Vascath malfunctioning, it has been marginal for some time now.  eICU Interventions  Ground crew asked to evaluate at bedside for possible line replacement.        Amyjo Mizrachi U Alsie Younes 08/01/2024, 9:29 PM

## 2024-08-01 NOTE — Progress Notes (Addendum)
 eLink Physician-Brief Progress Note Patient Name: Lawrence [name] DOB: 09-Jun-1946 MRN: [id number]   Date of Service  08/01/2024  HPI/Events of Note  Notified by RN that albumin that was ordered inadvertently leaked into the patient's bed.   eICU Interventions  Albumin 25g IV reordered x 1.      Intervention Category Minor Interventions: Other:  Helmi Hechavarria 08/01/2024, 5:41 AM  6:24 AM Notigief of hgb down to 5.4 from 7.3.  No signs of active bleeding.   ABG with pH 7.6, pCO2 21,7, pO2 198.   Plan> Transfuse 2 units pRBCs.  Type and cross ordered.  Decrease minute ventilation - RR down to 20 from 26.

## 2024-08-01 NOTE — Progress Notes (Signed)
 Pccm called to bedside due to HD cath in left subclavian no longer drawing back. Spoke with family at bedside and gave consent for femoral hd cath placement.  Will order coags in morning. Spoke with bedside rn to not use hd cath on left subclavian. Will check coags and cbc in am before pulling.   JD Emilio RIGGERS [employer] [facility] 08/01/2024, 10:46 PM  Please see [email or web address] for pager details.  From 7A-7P if no response, please call [phone]. After hours, please call ELink [phone].

## 2024-08-01 NOTE — TOC Progression Note (Signed)
 Transition of Care [facility]) - Progression Note    Patient Details  Name: Lawrence [name] MRN: [id number] Date of Birth: 29-May-1946  [facility] of [facility]) CM/SW Contact  Carmelita FORBES Carbon, LCSW Phone Number: 08/01/2024, 11:56 AM  Clinical Narrative:    Patient transferred to 4N. Patient is currently intubated. ICM will continue to follow.   Expected Discharge Plan: IP Rehab Facility Barriers to Discharge: Continued Medical Work up               Expected Discharge Plan and Services   Discharge Planning Services: CM Consult Post Acute Care Choice: IP Rehab Living arrangements for the past 2 months: Single Family Home                                       Social Drivers of Health (SDOH) Interventions SDOH Screenings   Food Insecurity: No Food Insecurity (07/17/2024)  Housing: Low Risk  (07/17/2024)  Transportation Needs: No Transportation Needs (07/17/2024)  Utilities: Not At Risk (07/17/2024)  Social Connections: Moderately Isolated (07/17/2024)  Tobacco Use: Low Risk  (07/17/2024)    Readmission Risk Interventions     No data to display

## 2024-08-01 NOTE — Progress Notes (Signed)
   08/01/24 0620  Adult Ventilator Settings  Set Rate (S)  20 bmp   Changed RR to 20 bpm per Dr Yap.

## 2024-08-01 NOTE — Progress Notes (Signed)
 NAME:  Lawrence [name], MRN:  [id number], DOB:  09-20-1946, LOS: 15 ADMISSION DATE:  07/16/2024, CONSULTATION DATE: 07/17/2024 REFERRING MD:  Debby Carrier , CHIEF COMPLAINT: Status postcardiac arrest  History of Present Illness:  78 year old male with diabetes, hypertension, BPH status post TURP who presented 10/25 after having a ground-level fall with recent onset of numbness in his lower extremity- complete loss of sensation to BLE & urinary incontinence after fall.  Neurosurgery was consulted, patient taken to OR for partial anterior cervical discectomy C7-T1 but course was complicated with multiple time PEA cardiac arrest in OR, procedure was aborted, TEE was done which showed severe RV dysfunction and dilatation with moderate to severe LV dysfunction, patient was transferred to ICU for close monitoring.    In ICU patient blood pressure started dropping and he went into PEA cardiac arrest again, ROSC was achieved after 2 minutes.  CTA PE study with extensive pulmonary artery thrombus bilaterally with evidence of right heart strain. Unfortunately due to surgery, it was recommended no TNK/TPA. 10/27 taken for thrombectomy.   Pertinent  Medical History   Past Medical History:  Diagnosis Date   Arthritis    Diabetes mellitus without complication (HCC)    GERD (gastroesophageal reflux disease)    Hypertension    Sleep apnea     Significant Hospital Events: Including procedures, antibiotic start and stop dates in addition to other pertinent events   Admitted and PCCM consulted, coded multiple times in OR (PEA) 07/18/2024 mechanical thrombectomy was done for bilateral pulmonary embolism. 10/28 nephro consult 10/29 on 5 NE, plan for HD cath placement and CRRT  10/30 add midodrine  11/3 remain on CRRT, came off of vasopressors and inotropic support. 11/4 CRRT was stopped, not making much urine 11/5  Made 50 cc of urine, serum creatinine and potassium trended up 11/9 recurrent arrest on  floor  Interim History / Subjective:  Anterior neck hematoma got bigger; watching for now as airway is protected and HD stable.  Unfortunately not waking up.  Objective    Blood pressure (!) 148/51, pulse 69, temperature (!) 97 F (36.1 C), temperature source Esophageal, resp. rate 17, height 6' 0.99 (1.854 m), weight 71.6 kg, SpO2 100%.    Vent Mode: PRVC FiO2 (%):  [40 %-50 %] 40 % Set Rate:  [10 bmp-26 bmp] 10 bmp Vt Set:  [580 mL] 580 mL PEEP:  [5 cmH20-8 cmH20] 5 cmH20 Plateau Pressure:  [18 cmH20-28 cmH20] 18 cmH20   Intake/Output Summary (Last 24 hours) at 08/01/2024 1124 Last data filed at 08/01/2024 1100 Gross per 24 hour  Intake 2725.8 ml  Output 2516.5 ml  Net 209.3 ml   Filed Weights   07/30/24 1411 07/31/24 0500 08/01/24 0500  Weight: 74.8 kg 76.6 kg 71.6 kg    Examination: Seen on prop/fent Unresponsive +cough No response to pain No corneals, pupil responses if present are very sluggish, normal size though and equal Neck hematoma a little bigger  ABG alkalotic, overbreathing vent  Resolved problem list  Lactic acidosis  Endotracheally intubated Bilateral multifocal pneumonia Cardiogenic shock Acute respiratory failure with hypoxia and hypercapnia  Assessment and Plan  Status post PEA cardiac arrest in the setting of bilateral massive PE status post mechanical thrombectomy Acute RV failure, improving Rib fractures in the setting of CPR Status post mechanical fall with C7/T1 spinal cord injury and displaced C7 vertebral fracture 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  Lactate okay after so don't think down long; however, his preceding multiple days of being poorly responsive are concerning for a potential event; he is not waking up at present.  L neck anterior hematoma, ABLA- with airway compromise now, will need addressed if working toward extubation  - Heparin  on hold - 2 units blood - DDAVP - Check ammonia, EEG; if not waking up this afternoon off sedation will get another CT head; if not waking up tomorrow will ask neuro to see and get MRI  - Palliative consult - If neck hematoma starts to encroach airway will remove sutures to relieve pressure  Patient Lines/Drains/Airways Status     Active Line/Drains/Airways     Name Placement date Placement time Site Days   Arterial Line 07/31/24 Right Radial 07/31/24  0945  Radial  1   Peripheral IV 07/17/24 18 G Right Forearm 07/17/24  1227  Forearm  15   Peripheral IV 07/28/24 20 G Anterior;Distal;Right Forearm 07/28/24  1815  Forearm  4   CVC Triple Lumen 07/31/24 Right Internal jugular 07/31/24  1010  -- 1   Hemodialysis Catheter Left Subclavian Triple lumen Temporary (Non-Tunneled) 07/31/24  1130  Subclavian  1   Airway 7.5 mm 07/31/24  0842  -- 1   Small Bore Feeding Tube 10 Fr. Left nare Marking at nare/corner of mouth 69 cm 07/20/24  1055  Left nare  12   Wound 07/17/24 1413 Surgical Closed Surgical Incision Neck 07/17/24  1413  Neck  15   Wound 07/18/24 1420 Other (Comment) Thigh Anterior;Proximal;Right 07/18/24  1420  Thigh  14   Wound 07/23/24 1550 Pressure Injury Buttocks Left;Medial Stage 2 -  Partial thickness loss of dermis presenting as a shallow open injury with a red, pink wound bed without slough. 07/23/24  1550  Buttocks  9             32 min cc time independent of procedures Rolan Sharps MD PCCM

## 2024-08-01 NOTE — Progress Notes (Signed)
 PHARMACY - ANTICOAGULATION CONSULT NOTE  Pharmacy Consult for heparin  Indication: pulmonary embolus  Allergies  Allergen Reactions   Penicillins Hives   Lucentis [Ranibizumab] Rash    Patient Measurements: Height: 6' 0.99 (185.4 cm) Weight: 71.6 kg (157 lb 13.6 oz) IBW/kg (Calculated) : 79.88 HEPARIN  DW (KG): 81.8  Vital Signs: Temp: 98.2 F (36.8 C) (11/10 0400) Temp Source: Esophageal (11/10 0000) BP: 142/52 (11/10 0930) Pulse Rate: 65 (11/10 0930)  Labs: Recent Labs    07/30/24 0550 07/30/24 0839 07/31/24 0434 07/31/24 0950 07/31/24 1448 07/31/24 2328 08/01/24 0500 08/01/24 0521  HGB  --  7.4* 7.3* 7.5*  --   --  5.4* 6.8*  HCT  --  23.0* 22.9* 22.0*  --   --  17.1* 20.0*  PLT  --  250 270  --   --   --  221  --   HEPARINUNFRC 0.30  --  0.29*  --   --  <0.10*  --   --   CREATININE 4.72*  --  4.40*  --  4.70*  --  2.76*  --     Estimated Creatinine Clearance: 23.1 mL/min (A) (by C-G formula based on SCr of 2.76 mg/dL (H)).   Assessment: Lawrence [name] is a 60 YOM admitted with concern for spinal cord injury. Patient taken to the OR for neurosurgery, procedure complicated by multiple rounds of CPR.   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 S/p IR 10/27 for thrombectomy.  11/10 AM: Patient has Hgb of 5.4. Hold heparin  infusion for now and reassess daily as patient has an indication for anticoagulation.   Goal of Therapy:  Heparin  level 0.3-0.7 units/ml Monitor platelets by anticoagulation protocol: Yes   Plan:  Hold heparin  Hgb 5.4  F/u continuation of heparin  once hemoglobin is stable and above 7 Monitor daily heparin  level, CBC, and signs/symptoms of bleeding F/u plans for transition to oral AC as able  R. Samual Satterfield, PharmD PGY-1 Acute Care Pharmacy Resident [facility] Please refer to [facility] for [facility] Pharmacy numbers 08/01/2024 9:51 AM

## 2024-08-01 NOTE — Progress Notes (Signed)
 Wasted 55ml of Fentanyl  with Leonce Frost RN.

## 2024-08-01 NOTE — Progress Notes (Signed)
 SLP Cancellation Note  Patient Details Name: Cordelro Gautreau MRN: [id number] DOB: 1946-02-26   Cancelled treatment:       Reason Eval/Treat Not Completed: Patient not medically ready. Pt intubated, arrested over the weekend. Will sign off and await new orders for SLP when appropriate.    Ziana Heyliger, Consuelo Fitch 08/01/2024, 11:41 AM

## 2024-08-01 NOTE — Progress Notes (Signed)
 Events from the weekend reviewed. Patient seen an examined. Neck Hematoma expanded after recent CPR.  It is sizable at this point., tense.  He's currently ventilating well and intubated, not following commands.  He is off heparin  drip. At this point, if he stabilizes and reaches point where he potentially can be extubated, I likely would recommend a neck exploration for his hematoma in the OR.  We will see how he progresses over the next few days.

## 2024-08-01 NOTE — Procedures (Signed)
 Central Venous Catheter Insertion Procedure Note  Berthel Bagnall  [id number]  [birth year]  Date:08/01/24  Time:10:40 PM   Provider Performing:Maeghan Canny D Emilio   Procedure: Insertion of Non-tunneled Central Venous Catheter(36556)with US  guidance (23062)    Indication(s) Hemodialysis  Consent Risks of the procedure as well as the alternatives and risks of each were explained to the patient and/or caregiver.  Consent for the procedure was obtained and is signed in the bedside chart  Anesthesia Topical only with 1% lidocaine    Timeout Verified patient identification, verified procedure, site/side was marked, verified correct patient position, special equipment/implants available, medications/allergies/relevant history reviewed, required imaging and test results available.  Sterile Technique Maximal sterile technique including full sterile barrier drape, hand hygiene, sterile gown, sterile gloves, mask, hair covering, sterile ultrasound probe cover (if used).  Procedure Description Area of catheter insertion was cleaned with chlorhexidine  and draped in sterile fashion.   With real-time ultrasound guidance a HD catheter was placed into the right femoral vein.  Nonpulsatile blood flow and easy flushing noted in all ports.  The catheter was sutured in place and sterile dressing applied.  Complications/Tolerance None; patient tolerated the procedure well. Chest X-ray is ordered to verify placement for internal jugular or subclavian cannulation.  Chest x-ray is not ordered for femoral cannulation.  EBL Minimal  Specimen(s) None  JD Emilio RIGGERS  Pulmonary & Critical Care 08/01/2024, 10:41 PM  Please see [email or web address] for pager details.  From 7A-7P if no response, please call [phone]. After hours, please call ELink [phone].

## 2024-08-01 NOTE — Progress Notes (Signed)
 Date and time results received: 08/01/24 0609 (use smartphrase .now to insert current time)  Test: Hgb Critical Value: 5.4  Name of Provider Notified: eLink MD  Orders Received? Or Actions Taken?: Orders Received - See Orders for details

## 2024-08-01 NOTE — Progress Notes (Signed)
 Nutrition Follow-up  DOCUMENTATION CODES:   Not applicable  INTERVENTION:   Continue tube Feeding via Cortrak: Nepro w/ Carb Steady @ 50 ml/h (1200 ml per day) Prosource TF 20 60 ml BID Provides 2320 kcal, 137 g protein, 872 ml free water   Continue Renal MVI  Continue to monitor skin on follow--ups, may benefit from addition of Juven for wound healing.   NUTRITION DIAGNOSIS:   Inadequate oral intake related to acute illness as evidenced by NPO status.  Being addressed via TF   GOAL:   Patient will meet greater than or equal to 90% of their needs  Progressing  MONITOR:   Vent status, TF tolerance, Labs, Weight trends, Skin  REASON FOR ASSESSMENT:   Consult Assessment of nutrition requirement/status (CRRT)  ASSESSMENT:   78 yo admitted post fall with onset of numbness in BLE. Neurosurgery consulted, pt taken to OR for partial anterior cervical discectomy but procedure aborted secondary to multiple PEA arrests, intubated. Pt with acute biventricular HF, anuric AKI requiring initiation of CRRT PMH includes DM, HTN, BPH s/p TURP  08-04-24 Admitted, coded multiple times in OR, acute biventricular HF, massive PE, shock 10/27 Mechanical thrombectomy for bilateral PE 10/28 Extubated 10/29 Cortrak, CRRT initiation, TF (Vital 1.5) initiated, levophed 10/31 CBGs 280-350 range, only on sliding scale insulin  11/03 FEES by SLP: remains NPO; CBGs in 200s-300s despite insulin  adjustments, HgbA1c 12 (poorly controlled at baseline), switched to Glucerna, off levophed 11/04- CRRT stopped 11/09- Code Blue; transferred back to ICU and re-intubated  Pt discussed during ICU rounds with RN and CCM. Pt had to be re-intubated following 3rd code blue over weekend. Pt back on CRRT and tube feeding formula switched to Nepro. Discussed adjusted rate to meet pt's needs with RN, RN updated rate. Pt remains on levo support, but no longer sedated, still not responding to stimulus. CCM checking ammonia  and suggests MRI per neuro if pt continues to not wake up. Pt has neck hematoma from previous HD cath site, pt required 2 units blood today. Palliative care following, family discussions ongoing.  Patient is currently intubated on ventilator support MV: 11.2 L/min Temp (24hrs), Avg:98.6 F (37 C), Min:98.2 F (36.8 C), Max:99.1 F (37.3 C) MAP (a-line): 61-72 mmHg  Propofol : now weaned and off  Admit weight: 79.4 kg  Current weight: 71.6 kg **CRRT re-initiated yesterday which may explain some of the drop in wt from 76.6 kg to 71.6 kg since 11/9 but likely some measurement error as well**    Intake/Output Summary (Last 24 hours) at 08/01/2024 0928 Last data filed at 08/01/2024 0900 Gross per 24 hour  Intake 2371.26 ml  Output 2208.5 ml  Net 162.76 ml   Net IO Since Admission: 509.23 mL [08/01/24 0928]  Drains/Lines: Cortrak gastric A line R radial CVC R internal jugular HD Cath L subclavian   Nutritionally Relevant Medications: Scheduled Meds:  docusate  100 mg Per Tube BID   insulin  aspart  0-20 Units Subcutaneous Q4H   multivitamin  1 tablet Per Tube QHS   polyethylene glycol  17 g Per Tube BID   senna-docusate  2 tablet Per Tube QHS   sertraline  50 mg Per Tube Daily   Continuous Infusions:  sodium chloride  Stopped (08/01/24 0606)   feeding supplement (NEPRO CARB STEADY) 65 mL/hr at 08/01/24 0900   fentaNYL  infusion INTRAVENOUS 75 mcg/hr (08/01/24 0900)   meropenem (MERREM) IV Stopped (08/01/24 0331)   norepinephrine (LEVOPHED) Adult infusion 4.5 mcg/min (08/01/24 0900)   PrismaSol BGK 2/3.5  1,800 mL/hr at 08/01/24 9096   PrismaSol BGK 2/3.5 400 mL/hr at 08/01/24 9693   PrismaSol BGK 2/3.5 400 mL/hr at 08/01/24 0355   propofol  (DIPRIVAN ) infusion Stopped (08/01/24 0833)   vancomycin     **was on albumin, now discontinued**  Labs Reviewed: Sodium 136<--132 Potassium 3.5<--5.3<--5.8<--6.4 BUN 61/ Cr 2.76 Phosphorus 4.4<--6.6 Albumin 2.6<--1.8 CBG ranges  from 80-176 mg/dL over the last 24 hours   Diet Order:   Diet Order             Diet NPO time specified Except for: Ice Chips, Sips with Meds  Diet effective now                   EDUCATION NEEDS:   Not appropriate for education at this time  Skin:  Skin Assessment: Skin Integrity Issues: Skin Integrity Issues:: Stage II Stage II: L/medial buttock  Last BM:  11/9 multiple type 7  Height:   Ht Readings from Last 1 Encounters:  07/31/24 6' 0.99 (1.854 m)    Weight:   Wt Readings from Last 1 Encounters:  08/01/24 71.6 kg    BMI:  Body mass index is 20.83 kg/m.  Estimated Nutritional Needs:   Kcal:  2300-2500 kcals  Protein:  125-145 g  Fluid:  1.5 L   Josette Glance, MS, RDN, LDN Clinical Dietitian I Please reach out via secure chat

## 2024-08-01 NOTE — Consult Note (Signed)
 Palliative Care Consult Note                                  Date: 08/01/2024   Patient Name: Lawrence [name]  DOB:1946-09-04  FMW:[id number]  Age / Sex:78 y.o., male  PCP: Viviana Candyce HERO, MD Referring Physician: Claudene Toribio BROCKS, MD  Reason for Consultation: Establishing goals of care  Past Medical History:  Diagnosis Date   Arthritis    Diabetes mellitus without complication (HCC)    GERD (gastroesophageal reflux disease)    Hypertension    Sleep apnea    Assessment & Plan:   HPI/Patient Profile: 78 y.o. 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 Hospitalization has been complicated by multiple PEA arrest. Patient had multiple PEA arrests in the OR during attempt of ACDF of C7-T1. Another cardiac arrest in the ICU after becoming hypotensive and found to have bilateral pulmonary embolism s/p mechanical thrombectomy (10/27). Patient transferred out of ICU from 11/6-11/9 and had another cardiac arrest on 11/9 due to hypoventilation.   MRI on 10/26 showed multiple traumatic C7-T1 findings including epidural canal hemorrhage, disc bulge, edema, fractures, and contusions. Hematoma on left neck being followed by neurosurgery and plan for evacuation when patient is stable. Ongoing dysphagia during hospitalization prior to intubation requiring coretrak. AKI requiring CRRT and iHD with current Cr at 2.76, improved from 4.7 from the last 24 hours.  Palliative medicine consulted for goals of care conversation.   SUMMARY OF RECOMMENDATIONS   Full code, full scope Continue GOC and code status discussion  Symptom Management:  Per primary team  Code Status: Full Code  Prognosis:  Unable to determine  Discharge Planning:  To Be Determined   Discussed with: Claudene MD, Oneita MD, Celestia RN, wife, son  Subjective:   Reviewed medical records, received report  from team, assessed the patient and then meet at the patient's bedside to discuss diagnosis, prognosis, GOC, EOL wishes disposition and options.  Before meeting with the patient/family, I spent time reviewing the chart notes including H&P, neurosurgery, PT/OT/TOC, hospitalist and critical care notes. I also reviewed vital signs, nursing flowsheets, medication administrations record, labs, and imaging.  I met with wife and son at bedside.   We meet to discuss diagnosis prognosis, GOC, EOL wishes, disposition and options. Concept of Palliative Care was introduced as specialized medical care for people and their families living with serious illness.  If focuses on providing relief from the symptoms and stress of a serious illness.  The goal is to improve quality of life for both the patient and the family. Values and goals of care important to patient and family were attempted to be elicited.  Created space and opportunity for patient  and family to explore thoughts and feelings regarding current medical situation   Natural trajectory and current clinical status were discussed. Questions and concerns addressed. Patient encouraged to call with questions or concerns.    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 - Wife shares that he goes everywhere with her to perform daily tasks and shopping - Married to Ben Bolt and  has 2 a son and daughter Birt and Warren) - Son (Alvaro) lives in [place] - Daughter Karle) lives locally  Patient Values: - Family shares that the patient is a very caring man and very religious  Baseline Status: - Patient was ambulatory and walked with a cane at baseline, but has more concerns with lower extremity  sensation in the last couple of weeks - Family shared that the patient had a fall many years ago when he worked as an personnel officer which resulted in some concern for spinal injury but reported that resolved  Today's Discussion: - Spoke with patient's wife and son in room to discuss overall current trajectory and treatment plan from the ICU team - Family understands the current plan is to stop sedating medications and assess for mental status and that if the patient does not wake up this afternoon after cessation of sedation then the plan is to get another CT head to assess - Family understands the overall trajectory and that the prognosis is poor, but remain hopeful for a recovery - Family shared that patient would never want long term dialysis, but knows that acutely the patient requires dialysis during this critical stage - Spoke privately with son Birt) who asked for more transparency and understanding of palliative medicine and he was educated on the reason for our consult and the role palliative has in his father's care, shared with son that overall the medical team is worried and the prognosis is poor  Review of Systems  Unable to perform ROS  Objective:   Primary Diagnoses: Present on Admission:  C7 spinal cord injury (HCC)  Vent Mode: PRVC FiO2 (%):  [40 %-50 %] 40 % Set Rate:  [10 bmp-26 bmp] 10 bmp Vt Set:  [580 mL] 580 mL PEEP:  [5 cmH20-8 cmH20] 5 cmH20 Plateau Pressure:  [18 cmH20-28 cmH20] 18 cmH20  Vital Signs:  BP (!) 132/52   Pulse 71   Temp (!) 97 F (36.1 C) (Esophageal)   Resp 15   Ht 6' 0.99 (1.854 m)   Wt 71.6 kg   SpO2 100%   BMI 20.83 kg/m   Physical Exam Constitutional:      Appearance: He is ill-appearing.  HENT:     Head: Normocephalic.     Nose: Nose normal.  Cardiovascular:     Rate and Rhythm: Normal rate.  Pulmonary:     Comments: On full support invasive ventilation Neurological:     Comments: Sedated    Palliative Assessment/Data:  20%    Thank you for allowing us  to participate in the care of Tamaj Jurgens PMT will continue to support holistically.  Billing based on MDM: High  Problems Addressed: One acute or chronic illness or injury that poses a threat to life or bodily function  Amount and/or Complexity of Data: Category 1:Review of prior external note(s) from each unique source, Review of the result(s) of each unique test, and Assessment requiring an independent historian(s) and Category 3:Discussion of management or test interpretation with external physician/other qualified health care professional/appropriate source (not separately reported)  Signed by: Fairy FORBES Shan DEVONNA Palliative Medicine Team  Team Phone # [phone] (Nights/Weekends)  08/01/2024, 10:59 AM

## 2024-08-01 NOTE — Progress Notes (Signed)
 Routine STAT EEG complete. Results pending.

## 2024-08-01 NOTE — Procedures (Signed)
 Patient Name: Lawrence [name]  MRN: [id number]  Epilepsy Attending: Arlin MALVA Krebs  Referring Physician/Provider: Claudene Toribio BROCKS, MD  Date: 08/01/2024 Duration: 22.20 mins  Patient history: 78 year old male status post cardiac arrest.  EEG to evaluate for seizure.  Level of alertness: comatose  AEDs during EEG study: Propofol   Technical aspects: This EEG study was done with scalp electrodes positioned according to the 10-20 International system of electrode placement. Electrical activity was reviewed with band pass filter of 1-70Hz , sensitivity of 7 uV/mm, display speed of 15mm/sec with a 60Hz  notched filter applied as appropriate. EEG data were recorded continuously and digitally stored.  Video monitoring was available and reviewed as appropriate.  Description: EEG showed continuous generalized background suppression, not reactive to stimulation.  Hyperventilation and photic stimulation were not performed.     ABNORMALITY - Background suppression, generalized  IMPRESSION: This study is suggestive of profound diffuse encephalopathy.  No seizures or epileptiform discharges were seen throughout the recording.  Medardo Hassing O Marchele Decock

## 2024-08-01 NOTE — Progress Notes (Signed)
 PT Cancellation Note  Patient Details Name: Arlen Dupuis MRN: [id number] DOB: [birth year]   Cancelled Treatment:    Reason Eval/Treat Not Completed: Medical issues which prohibited therapy  Patient with code event yesterday and now intubated and sedated with RASS -4. Will follow and monitor for appropriateness to resume therapy.    Macario RAMAN, PT Acute Rehabilitation Services  Office [phone]  Macario SHAUNNA Soja 08/01/2024, 9:00 AM

## 2024-08-02 DIAGNOSIS — I469 Cardiac arrest, cause unspecified: Secondary | ICD-10-CM | POA: Diagnosis not present

## 2024-08-02 DIAGNOSIS — N179 Acute kidney failure, unspecified: Secondary | ICD-10-CM | POA: Diagnosis not present

## 2024-08-02 DIAGNOSIS — G8220 Paraplegia, unspecified: Secondary | ICD-10-CM | POA: Diagnosis not present

## 2024-08-02 DIAGNOSIS — E875 Hyperkalemia: Secondary | ICD-10-CM | POA: Diagnosis not present

## 2024-08-02 LAB — RENAL FUNCTION PANEL
Albumin: 2.8 g/dL — ABNORMAL LOW (ref 3.5–5.0)
Anion gap: 13 (ref 5–15)
BUN: 49 mg/dL — ABNORMAL HIGH (ref 8–23)
CO2: 24 mmol/L (ref 22–32)
Calcium: 8.9 mg/dL (ref 8.9–10.3)
Chloride: 98 mmol/L (ref 98–111)
Creatinine, Ser: 1.89 mg/dL — ABNORMAL HIGH (ref 0.61–1.24)
GFR, Estimated: 36 mL/min — ABNORMAL LOW (ref >60)
Glucose, Bld: 199 mg/dL — ABNORMAL HIGH (ref 70–99)
Phosphorus: 2.9 mg/dL (ref 2.5–4.6)
Potassium: 3.8 mmol/L (ref 3.5–5.1)
Sodium: 135 mmol/L (ref 135–145)

## 2024-08-02 LAB — TYPE AND SCREEN
ABO/RH(D): O POS
Antibody Screen: NEGATIVE
Unit division: 0
Unit division: 0

## 2024-08-02 LAB — CBC
HCT: 26.9 % — ABNORMAL LOW (ref 39.0–52.0)
Hemoglobin: 8.7 g/dL — ABNORMAL LOW (ref 13.0–17.0)
MCH: 30.3 pg (ref 26.0–34.0)
MCHC: 32.3 g/dL (ref 30.0–36.0)
MCV: 93.7 fL (ref 80.0–100.0)
Platelets: 221 K/uL (ref 150–400)
RBC: 2.87 MIL/uL — ABNORMAL LOW (ref 4.22–5.81)
RDW: 19.4 % — ABNORMAL HIGH (ref 11.5–15.5)
WBC: 9.3 K/uL (ref 4.0–10.5)
nRBC: 0.2 % (ref 0.0–0.2)

## 2024-08-02 LAB — BASIC METABOLIC PANEL WITH GFR
Anion gap: 14 (ref 5–15)
BUN: 48 mg/dL — ABNORMAL HIGH (ref 8–23)
CO2: 23 mmol/L (ref 22–32)
Calcium: 9 mg/dL (ref 8.9–10.3)
Chloride: 99 mmol/L (ref 98–111)
Creatinine, Ser: 1.92 mg/dL — ABNORMAL HIGH (ref 0.61–1.24)
GFR, Estimated: 36 mL/min — ABNORMAL LOW (ref >60)
Glucose, Bld: 197 mg/dL — ABNORMAL HIGH (ref 70–99)
Potassium: 3.9 mmol/L (ref 3.5–5.1)
Sodium: 136 mmol/L (ref 135–145)

## 2024-08-02 LAB — BPAM RBC
Blood Product Expiration Date: 202512082359
Blood Product Expiration Date: 202512082359
ISSUE DATE / TIME: 202511100913
ISSUE DATE / TIME: 202511100913
Unit Type and Rh: 5100
Unit Type and Rh: 5100

## 2024-08-02 LAB — PHOSPHORUS: Phosphorus: 2.9 mg/dL (ref 2.5–4.6)

## 2024-08-02 LAB — GLUCOSE, CAPILLARY
Glucose-Capillary: 194 mg/dL — ABNORMAL HIGH (ref 70–99)
Glucose-Capillary: 156 mg/dL — ABNORMAL HIGH (ref 70–99)

## 2024-08-02 LAB — MAGNESIUM: Magnesium: 2.4 mg/dL (ref 1.7–2.4)

## 2024-08-02 LAB — APTT: aPTT: 39 s — ABNORMAL HIGH (ref 24–36)

## 2024-08-02 LAB — PROTIME-INR
INR: 1.2 (ref 0.8–1.2)
Prothrombin Time: 16 s — ABNORMAL HIGH (ref 11.4–15.2)

## 2024-08-02 MED ORDER — GLYCOPYRROLATE 0.2 MG/ML IJ SOLN
0.2000 mg | INTRAMUSCULAR | Status: DC | PRN
  Filled 2024-08-02: qty 1

## 2024-08-02 MED ORDER — POLYVINYL ALCOHOL 1.4 % OP SOLN: 1.0000 [drp] | Freq: Four times a day (QID) | OPHTHALMIC | Status: DC | PRN

## 2024-08-02 MED ORDER — MIDODRINE HCL 5 MG PO TABS: 10.0000 mg | ORAL_TABLET | Freq: Three times a day (TID) | ORAL | Status: DC

## 2024-08-02 MED ORDER — GLYCOPYRROLATE 1 MG PO TABS
1.0000 mg | ORAL_TABLET | ORAL | Status: DC | PRN
Start: 2024-08-02 — End: 2024-08-03

## 2024-08-02 MED ORDER — GLYCOPYRROLATE 0.2 MG/ML IJ SOLN: 0.2000 mg | INTRAMUSCULAR | Status: DC | PRN

## 2024-08-02 MED ORDER — MORPHINE 100MG IN NS 100ML (1MG/ML) PREMIX INFUSION
0.0000 mg/h | INTRAVENOUS | Status: DC
  Administered 2024-08-02: 15 mg/h via INTRAVENOUS
  Administered 2024-08-02: 5 mg/h via INTRAVENOUS
  Filled 2024-08-02 (×2): qty 100

## 2024-08-02 MED ORDER — MIDODRINE HCL 5 MG PO TABS
10.0000 mg | ORAL_TABLET | Freq: Three times a day (TID) | ORAL | Status: DC
  Administered 2024-08-02: 10 mg
  Filled 2024-08-02: qty 2

## 2024-08-02 MED ORDER — CHLORHEXIDINE GLUCONATE CLOTH 2 % EX PADS
6.0000 | MEDICATED_PAD | Freq: Every day | CUTANEOUS | Status: DC
  Administered 2024-08-02: 6 via TOPICAL

## 2024-08-02 MED ORDER — MIDAZOLAM HCL (PF) 2 MG/2ML IJ SOLN
2.0000 mg | INTRAMUSCULAR | Status: DC | PRN
  Filled 2024-08-02: qty 2

## 2024-08-02 MED ORDER — ACETAMINOPHEN 650 MG RE SUPP: 650.0000 mg | Freq: Four times a day (QID) | RECTAL | Status: DC | PRN

## 2024-08-02 MED ORDER — MORPHINE BOLUS VIA INFUSION
5.0000 mg | INTRAVENOUS | Status: DC | PRN
  Administered 2024-08-02: 5 mg via INTRAVENOUS

## 2024-08-02 MED ORDER — ACETAMINOPHEN 325 MG PO TABS: 650.0000 mg | ORAL_TABLET | Freq: Four times a day (QID) | ORAL | Status: DC | PRN

## 2024-08-02 MED ORDER — SODIUM CHLORIDE 0.9 % IV SOLN: INTRAVENOUS | Status: DC

## 2024-08-02 MED ORDER — POTASSIUM & SODIUM PHOSPHATES 280-160-250 MG PO PACK
2.0000 | PACK | ORAL | Status: DC
Start: 2024-08-02 — End: 2024-08-02
  Administered 2024-08-02: 2
  Filled 2024-08-02: qty 2

## 2024-08-03 LAB — CULTURE, RESPIRATORY W GRAM STAIN

## 2024-08-04 LAB — CULTURE, BLOOD (ROUTINE X 2)
Culture: NO GROWTH
Culture: NO GROWTH

## 2024-08-05 LAB — CULTURE, BLOOD (ROUTINE X 2)
Culture: NO GROWTH
Culture: NO GROWTH

## 2024-08-05 MED FILL — Medication: Qty: 1 | Status: AC

## 2024-08-22 NOTE — Progress Notes (Signed)
 Inpatient Rehab Admissions Coordinator:   Events noted.  Compassionate extubation today.  Currently no role for CIR so we will sign off.    Reche Lowers, PT, DPT Admissions Coordinator [phone] 07/26/2024 2:01 PM

## 2024-08-22 NOTE — Progress Notes (Signed)
  [facility] NEPHROLOG[facility] PROGRESS NOTE  Assessment/ Plan: Pt is a 78 y.o. yo male with past medical history significant for hypertension, diabetes, BPH status post TURP presented after the fall status post cervical discectomy C7-T1 complicated by multiple PEA and AKI.  # Acute kidney injury likely ischemic ATN plus CIN, multiple episodes of PEA arrest, remains anuric.  Started CRRT on 10/29 which was paused on 11/4 with IHD and later restarted on CRRT. -Noted problem with the catheter overnight therefore right femoral catheter was placed by ICU.  Continue CRRT, all 4K bath.  Monitor lab and adjust CRRT prescription as necessary. - Continue strict ins and out and close lab monitoring.  # Cardiac arrest: Managing by critical care team.  # Hyperkalemia treating with CRRT.   Monitor lab.  # Vent dependent respiratory failure, managing with pulmonary team.  # Cardiogenic shock due to pulmonary embolus, currently on Levophed, monitor BP.  # Anemia of critical illness: Transfuse blood as needed.  Monitor hemoglobin.  Subjective: Seen and examined in ICU.  Patient is intubated, sedated and on Levophed.   No significant urine output.  Tolerating CRRT well.  No new event. Objective Vital signs in last 24 hours: Vitals:   07/28/2024 0800 07/26/2024 0840 07/30/2024 0900 07/29/2024 1000  BP: (!) 146/55 (!) 128/52 (!) 136/49 (!) 128/49  Pulse: 78  81 77  Resp: (!) 27  (!) 27 (!) 24  Temp: 99 F (37.2 C)     TempSrc: Esophageal     SpO2: 100%  100% 100%  Weight:      Height:       Weight change: 0.5 kg  Intake/Output Summary (Last 24 hours) at 08/13/2024 1057 Last data filed at 08/06/2024 1030 Gross per 24 hour  Intake 2652.3 ml  Output 2831.8 ml  Net -179.5 ml       Labs: RENAL PANEL Recent Labs  Lab 07/28/24 0434 07/28/24 1517 07/29/24 0231 07/29/24 0659 07/30/24 0553 07/30/24 0839 07/31/24 0434 07/31/24 0950 07/31/24 1448 08/01/24 0500 08/01/24 0521  08/01/24 1012 08/01/24 1734 07/30/2024 0433  NA 133*   < > 132*   < >  --   --  134*   < > 134* 137 136 137 136 136  135  K 5.4*   < > 6.4*   < > 5.7*   < > 5.2*   < > 5.3* 3.5 3.5 3.3* 3.7 3.9  3.8  CL 95*   < > 95*   < >  --   --  94*  --  96* 101  --   --  97* 99  98  CO2 22   < > 20*   < >  --   --  24  --  21* 21*  --   --  26 23  24   GLUCOSE 160*   < > 156*   < >  --   --  140*  --  194* 88  --   --  161* 197*  199*  BUN 93*   < > 143*   < >  --   --  92*  --  107* 61*  --   --  43* 48*  49*  CREATININE 3.88*   < > 5.07*   < >  --   --  4.40*  --  4.70* 2.76*  --   --  2.00* 1.92*  1.89*  CALCIUM 8.5*   < > 8.6*   < >  --   --  8.6*  --  8.5* 8.9  --   --  9.2 9.0  8.9  MG 2.5*  --  2.7*  --  2.3  --  2.3  --   --   --   --   --   --  2.4  PHOS 5.9*   < > 6.8*   < >  --   --  6.0*  --  6.6* 4.4  --   --  4.2 2.9  2.9  ALBUMIN 2.3*   < > 2.4*   < >  --   --  1.8*  --  2.0* 2.6*  --   --  3.0* 2.8*   < > = values in this interval not displayed.    Liver Function Tests: Recent Labs  Lab 08/01/24 0500 08/01/24 1734 08/05/2024 0433  ALBUMIN 2.6* 3.0* 2.8*   No results for input(s): LIPASE, AMYLASE in the last 168 hours. Recent Labs  Lab 07/28/24 0155 08/01/24 1350  AMMONIA 19 16   CBC: Recent Labs    08/01/24 0521 08/01/24 1012 08/01/24 1230 08/01/24 1851 07/24/2024 0433  HGB 6.8* 6.1* 7.3* 8.3* 8.7*  MCV  --   --  94.5  --  93.7    Cardiac Enzymes: No results for input(s): CKTOTAL, CKMB, CKMBINDEX, TROPONINI in the last 168 hours. CBG: Recent Labs  Lab 08/01/24 1514 08/01/24 1936 08/01/24 2329 08/14/2024 0329 08/08/2024 0718  GLUCAP 135* 158* 196* 194* 156*    Iron Studies: No results for input(s): IRON, TIBC, TRANSFERRIN, FERRITIN in the last 72 hours. Studies/Results: CT HEAD WO CONTRAST ( ) Result Date: 08/01/2024 CLINICAL DATA:  Initial evaluation for anoxic brain damage. EXAM: CT HEAD WITHOUT CONTRAST TECHNIQUE: Contiguous axial  images were obtained from the base of the skull through the vertex without intravenous contrast. RADIATION DOSE REDUCTION: This exam was performed according to the departmental dose-optimization program which includes automated exposure control, adjustment of the mA and/or kV according to patient size and/or use of iterative reconstruction technique. COMPARISON:  CT from 07/31/2024. FINDINGS: Brain: Cerebral volume within normal limits. Changes of chronic microvascular ischemic disease noted, stable. There is increased prominence of several scattered patchy hypodensities involving the periventricular and deep white matter both cerebral hemispheres, suspicious for evolving acute to subacute ischemic infarcts (series 2, images 22, 20, 19). No acute intracranial hemorrhage. Additionally, there is question of subtle loss of gray-white matter differentiation with loss of pole sulcation about the cerebellum, nonspecific, but could reflect superimposed changes of anoxic injury given provided history. The supratentorial gray-white matter differentiation is otherwise comparatively well maintained. No mass lesion or midline shift. No hydrocephalus or extra-axial fluid collection. Vascular: No abnormal hyperdense vessel. Skull: Scalp soft tissues within normal limits.  Calvarium intact. Sinuses/Orbits: Globes orbital soft tissues within normal limits. Mild mucoperiosteal thickening present about the paranasal sinuses. Trace bilateral mastoid effusions noted. Nasogastric tube in place. Other: None. IMPRESSION: 1. Increased prominence of several scattered patchy hypodensities involving the periventricular and deep white matter of both cerebral hemispheres, suspicious for evolving acute to subacute ischemic infarcts. 2. Question subtle loss of gray-white matter differentiation about the cerebellum since prior. While this finding could potentially be artifactual/technical in nature on this exam, possible changes of superimposed  anoxic injury could be considered given provided history. Correlation with dedicated MRI could be performed for further evaluation as warranted. Electronically Signed   By: Morene Hoard M.D.   On: 08/01/2024 23:59   EEG adult Result Date: 08/01/2024 Shelton Arlin KIDD, MD  08/01/2024  1:35 PM Patient Name: Lawrence [name] MRN: [id number] Epilepsy Attending: Arlin MALVA Krebs Referring Physician/Provider: Claudene Toribio BROCKS, MD Date: 08/01/2024 Duration: 22.20 mins Patient history: 78 year old male status post cardiac arrest.  EEG to evaluate for seizure. Level of alertness: comatose AEDs during EEG study: Propofol  Technical aspects: This EEG study was done with scalp electrodes positioned according to the 10-20 International system of electrode placement. Electrical activity was reviewed with band pass filter of 1-70Hz , sensitivity of 7 uV/mm, display speed of 29mm/sec with a 60Hz  notched filter applied as appropriate. EEG data were recorded continuously and digitally stored.  Video monitoring was available and reviewed as appropriate. Description: EEG showed continuous generalized background suppression, not reactive to stimulation.  Hyperventilation and photic stimulation were not performed.   ABNORMALITY - Background suppression, generalized IMPRESSION: This study is suggestive of profound diffuse encephalopathy.  No seizures or epileptiform discharges were seen throughout the recording. Arlin MALVA Krebs   ECHOCARDIOGRAM LIMITED Result Date: 07/31/2024    ECHOCARDIOGRAM LIMITED REPORT   Patient Name:   Lawrence [name] Date of Exam: 07/31/2024 Medical Rec #:  [id number]     Height:       73.0 in Accession #:    [id number]    Weight:       168.9 lb Date of Birth:  [birth year]     BSA:          2.002 m Patient Age:    78 years      BP:           134/42 mmHg Patient Gender: M             HR:           76 bpm. Exam Location:  Inpatient Procedure: Limited Echo, Color Doppler and Intracardiac Opacification Agent             (Both Spectral and Color Flow Doppler were utilized during            procedure). Indications:     Cardiac Arrest  History:         Patient has prior history of Echocardiogram examinations, most                  recent 07/19/2024. Abnormal ECG, Pulmonary Emoblus;                  Arrythmias:Cardiac Arrest.  Sonographer:     Logan Shove RDCS Referring Phys:  [id number] TORIBIO BROCKS CLAUDENE Diagnosing Phys: Dorn Ross MD IMPRESSIONS  1. Left ventricular ejection fraction, by estimation, is 70 to 75%. The left ventricle has hyperdynamic function. Left ventricular endocardial border not optimally defined to evaluate regional wall motion. There is moderate left ventricular hypertrophy.  2. RV not well visualized, grossly appears normal in size and function. . Right ventricular systolic function was not well visualized. The right ventricular size is not well visualized.  3. Limited echocardiogram FINDINGS  Left Ventricle: Left ventricular ejection fraction, by estimation, is 70 to 75%. The left ventricle has hyperdynamic function. Left ventricular endocardial border not optimally defined to evaluate regional wall motion. Definity contrast agent was given IV to delineate the left ventricular endocardial borders. There is moderate left ventricular hypertrophy. Right Ventricle: RV not well visualized, grossly appears normal in size and function. The right ventricular size is not well visualized. Right vetricular wall thickness was not well visualized. Right ventricular systolic function was not well visualized. Pericardium: There is no evidence of pericardial effusion. Venous: IVC assessment  for right atrial pressure unable to be performed due to mechanical ventilation. LEFT VENTRICLE PLAX 2D LVIDd:         3.80 cm LVIDs:         2.40 cm LV PW:         1.10 cm LV IVS:        1.40 cm  LV Volumes (MOD) LV vol d, MOD A2C: 105.0 ml LV vol d, MOD A4C: 68.7 ml LV vol s, MOD A2C: 36.7 ml LV vol s, MOD A4C: 25.8 ml LV SV MOD A2C:      68.3 ml LV SV MOD A4C:     68.7 ml LV SV MOD BP:      57.6 ml IVC IVC diam: 2.30 cm LEFT ATRIUM           Index LA diam:      2.30 cm 1.15 cm/m LA Vol (A2C): 14.1 ml 7.04 ml/m LA Vol (A4C): 39.9 ml 19.93 ml/m   AORTA Ao Root diam: 3.40 cm Dorn Ross MD Electronically signed by Dorn Ross MD Signature Date/Time: 07/31/2024/2:41:13 PM    Final (Updated)    CT CHEST ABDOMEN PELVIS WO CONTRAST Result Date: 07/31/2024 EXAM: CT CHEST, ABDOMEN AND PELVIS WITHOUT CONTRAST 07/31/2024 11:20:34 AM TECHNIQUE: CT of the chest, abdomen and pelvis was performed without the administration of intravenous contrast. Multiplanar reformatted images are provided for review. Automated exposure control, iterative reconstruction, and/or weight based adjustment of the mA/kV was utilized to reduce the radiation dose to as low as reasonably achievable. COMPARISON: CTA chest 07/17/2024. CLINICAL HISTORY: 77 year old male. Status post aborted C7-T1 ACDF. Suspected left neck hematoma. Sepsis. FINDINGS: CHEST: MEDIASTINUM AND LYMPH NODES: Left anterior lower neck skin staples, near the thoracic inlet. Underlying homogeneous intermediate to slightly hyperdense masslike collection measures slightly larger than 69 x 36 x 43 mm (AP x transverse x CC). Not completely included., with visible Estimated volume: 56 mL. Mild rightward mass effect on the trachea and thyroid at the thoracic inlet. Intubated, endotracheal tube tip above the clavicles. Small volume retained secretions in the trachea and at the tip of the endotracheal tube. Major airways remain patent. Enteric tube within the esophagus terminates in the distal stomach. Bilateral subclavian approach vascular catheters terminate in the superior vena cava. Normal heart size. No pericardial effusion. No mediastinal, hilar or axillary lymphadenopathy. LUNGS AND PLEURA: Moderate to severe right lower lobe consolidation with air bronchograms. Virtually complete lobar involvement.  Contralateral posterior basal segment left lower lobe consolidation, with additional patchy peribronchial opacity in the superior segment. Small layering left pleural effusion. No significant right pleural effusion. Upper lobes and middle lobes essentially clear now. No pneumothorax. ABDOMEN AND PELVIS: LIVER: The liver is unremarkable. GALLBLADDER AND BILE DUCTS: Vicarious contrast excretion to the gallbladder. No biliary ductal dilatation. SPLEEN: No acute abnormality. PANCREAS: No acute abnormality. ADRENAL GLANDS: No acute abnormality. KIDNEYS, URETERS AND BLADDER: Diminutive urinary bladder. No stones in the kidneys or ureters. No hydronephrosis. No perinephric or periureteral stranding. GI AND BOWEL: Small volume of oral contrast in the gastric antrum. Fluid containing but largely decompressed small and large bowel loops throughout the abdomen and pelvis. Rectal tube is in place. No bowel obstruction. REPRODUCTIVE ORGANS: No acute abnormality. PERITONEUM AND RETROPERITONEUM: No ascites. No free air. No pelvis free fluid. VASCULATURE: Normal caliber abdominal aorta. Mild free calcified abdominal aortic atherosclerosis. ABDOMINAL AND PELVIS LYMPH NODES: No lymphadenopathy. BONES AND SOFT TISSUES: Thoracic spine hyperostosis with flowing osteophytes and multilevel subsequent interbody  ankylosis. No acute osseous abnormality. IMPRESSION: 1. Right > lower lobe consolidative Pneumonia. Small layering left pleural effusion. 2. Left anterior lower neck collection underlying skin staples appears to be hematoma, with estimated volume slightly greater than 56 mL. Mild rightward mass effect on the trachea and thyroid at the thoracic inlet. 3. Satisfactory endotracheal tube, enteric tube terminating in the distal stomach, satisfactory rectal tube, and bilateral subclavian vascular catheters terminating in the SVC Electronically signed by: Helayne Hurst MD 07/31/2024 12:00 PM EST RP Workstation: [id number]   DG Chest Port 1  View Result Date: 07/31/2024 EXAM: 1 VIEW(S) XRAY OF THE CHEST 07/31/2024 11:23:00 AM COMPARISON: 07/31/2024 CLINICAL HISTORY: Encounter for central line placement [id number] FINDINGS: LINES, TUBES AND DEVICES: Left central venous catheter in place with tip over the SVC. Right central venous catheter in place with tip over the region of the confluence of the subclavian and brachiocephalic veins. ETT in place with tip 3.5 cm above the carina. Enteric tube courses through the chest to the abdomen beyond the field-of-view. LUNGS AND PLEURA: Progressive opacities within the right lower lobe concerning for pneumonia. No pulmonary edema. No pleural effusion. No pneumothorax. HEART AND MEDIASTINUM: No acute abnormality of the cardiac and mediastinal silhouettes. BONES AND SOFT TISSUES: Surgical staples noted over the lower neck. No acute osseous abnormality. IMPRESSION: 1. Progressive right lower lobe airspace opacity concerning for pneumonia or aspiration. Electronically signed by: Waddell Calk MD 07/31/2024 11:54 AM EST RP Workstation: [id number]   CT HEAD WO CONTRAST ( ) Result Date: 07/31/2024 EXAM: CT HEAD WITHOUT CONTRAST 07/31/2024 11:20:34 AM TECHNIQUE: CT of the head was performed without the administration of intravenous contrast. Automated exposure control, iterative reconstruction, and/or weight based adjustment of the mA/kV was utilized to reduce the radiation dose to as low as reasonably achievable. COMPARISON: 07/28/2024 CLINICAL HISTORY: Delirium FINDINGS: BRAIN AND VENTRICLES: No acute hemorrhage. No evidence of acute infarct. No hydrocephalus. No extra-axial collection. No mass effect or midline shift. Periventricular white matter changes, likely sequela of chronic small vessel ischemic disease. Age-related cerebral atrophy. 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 ORBITS: No acute abnormality. Bilateral lens replacements. SINUSES: Scattered mucosal thickening in the paranasal sinuses. Trace  fluid in the right mastoid air cells. Small amount of fluid layering in the nasopharynx. SOFT TISSUES AND SKULL: No acute soft tissue abnormality. No skull fracture. Nasogastric tube in place. IMPRESSION: 1. No acute intracranial abnormality. 2. 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 3. Similar chronic small vessel ischemic changes. 4. Age-related cerebral atrophy. Electronically signed by: Donnice Mania MD 07/31/2024 11:51 AM EST RP Workstation: [id number]    Medications: Infusions:  feeding supplement (NEPRO CARB STEADY) 50 mL/hr at 08/05/2024 1000   fentaNYL  infusion INTRAVENOUS Stopped (08/13/2024 0701)   meropenem (MERREM) IV Stopped (08/12/2024 0339)   norepinephrine (LEVOPHED) Adult infusion 3 mcg/min (08/14/2024 1000)   prismasol BGK 4/2.5 400 mL/hr at 08/12/2024 0634   prismasol BGK 4/2.5 400 mL/hr at 07/23/2024 0534   prismasol BGK 4/2.5 1,500 mL/hr at 08/06/2024 0755   propofol  (DIPRIVAN ) infusion Stopped (08/01/24 0833)    Scheduled Medications:  sodium chloride    Intravenous Once   Chlorhexidine  Gluconate Cloth  6 each Topical Q0600   docusate  100 mg Per Tube BID   feeding supplement (PROSource TF20)  60 mL Per Tube BID   guaiFENesin  5 mL Per Tube Q6H   insulin  aspart  0-20 Units Subcutaneous Q4H   midodrine  10 mg Per Tube TID WC   multivitamin  1 tablet Per Tube QHS   mouth rinse  15 mL Mouth Rinse Q2H   polyethylene glycol  17 g Per Tube BID   potassium & sodium phosphates  2 packet Per Tube Q4H   senna-docusate  2 tablet Per Tube QHS   sertraline  50 mg Per Tube Daily   sodium chloride  flush  3 mL Intravenous Q12H    have reviewed scheduled and prn medications.  Physical Exam: General: Critically ill looking male, intubated, sedated. Heart:RRR, s1s2 nl Lungs: Coarse breath sound bilateral Abdomen:soft, non-distended Extremities:No edema Dialysis Access: Right femoral line, site clean.  Lawrence [name] 08/21/2024,10:57 AM  LOS: 16 days

## 2024-08-22 NOTE — Death Summary Note (Signed)
  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  Neurosurgery was consulted, patient taken to OR for partial anterior cervical discectomy C7-T1 but course was complicated with multiple time PEA cardiac arrest in OR, procedure was aborted, TEE was done which showed severe RV dysfunction and dilatation with moderate to severe LV dysfunction, patient was transferred to ICU for close monitoring.     In ICU patient blood pressure started dropping and he went into PEA cardiac arrest again, ROSC was achieved after 2 minutes.  CTA PE study with extensive pulmonary artery thrombus bilaterally with evidence of right heart strain. Unfortunately due to surgery, it was recommended no TNK/TPA. 10/27 taken for thrombectomy.   Admitted and PCCM consulted, coded multiple times in OR (PEA) 07/18/2024 mechanical thrombectomy was done for bilateral pulmonary embolism. 10/28 nephro consult 10/29 on 5 NE, plan for HD cath placement and CRRT  10/30 add midodrine  11/3 remain on  CRRT, came off of vasopressors and inotropic support. 11/4 CRRT was stopped, not making much urine 11/5  Made 50 cc of urine, serum creatinine and potassium trended up 11/9 recurrent arrest on floor  Patient had no return of consciousness after his recurrent arrest with head CT findings the day after arrest already showing scattered ischemic infarcts and the beginnings of grey-white matter differentiation loss.  After discussions with family regarding what type and quality of life patient would want to lead (never would have wanted dialysis nor nursing home), they decided to pursue a comfort based approach to end of life.

## 2024-08-22 NOTE — Progress Notes (Signed)
 Subjective: NAEs o/n  Objective: Vital signs in last 24 hours: Temp:  [97 F (36.1 C)-99 F (37.2 C)] 99 F (37.2 C) (11/11 0800) Pulse Rate:  [58-97] 77 (11/11 1000) Resp:  [13-28] 24 (11/11 1000) BP: (116-162)/(45-62) 128/49 (11/11 1000) SpO2:  [98 %-100 %] 100 % (11/11 1000) Arterial Line BP: (141-205)/(39-69) 159/45 (11/11 1000) FiO2 (%):  [40 %] 40 % (11/11 0840) Weight:  [72.1 kg] 72.1 kg (11/11 0500)  Intake/Output from previous day: 11/10 0701 - 11/11 0700 In: 2880.4 [P.O.:100; I.V.:190.9; Blood:315; NG/GT:1237.3; IV Piggyback:557.2] Out: 2940 [Urine:134; Emesis/NG output:100; Blood:10] Intake/Output this shift: Total I/O In: 261.4 [I.V.:16.4; NG/GT:245] Out: 164.8   Eyes closed, pupils 3mm reactive bilaterally No movement to stimulus Neck hematoma stable Ventilating well  Lab Results: Recent Labs    08/01/24 1230 08/01/24 1851 07/25/2024 0433  WBC 9.1  --  9.3  HGB 7.3* 8.3* 8.7*  HCT 22.5* 25.6* 26.9*  PLT 210  207  --  221   BMET Recent Labs    08/01/24 1734 08/20/2024 0433  NA 136 136  135  K 3.7 3.9  3.8  CL 97* 99  98  CO2 26 23  24   GLUCOSE 161* 197*  199*  BUN 43* 48*  49*  CREATININE 2.00* 1.92*  1.89*  CALCIUM 9.2 9.0  8.9    Studies/Results: CT HEAD WO CONTRAST ( ) Result Date: 08/01/2024 CLINICAL DATA:  Initial evaluation for anoxic brain damage. EXAM: CT HEAD WITHOUT CONTRAST TECHNIQUE: Contiguous axial images were obtained from the base of the skull through the vertex without intravenous contrast. RADIATION DOSE REDUCTION: This exam was performed according to the departmental dose-optimization program which includes automated exposure control, adjustment of the mA and/or kV according to patient size and/or use of iterative reconstruction technique. COMPARISON:  CT from 07/31/2024. FINDINGS: Brain: Cerebral volume within normal limits. Changes of chronic microvascular ischemic disease noted, stable. There is increased prominence  of several scattered patchy hypodensities involving the periventricular and deep white matter both cerebral hemispheres, suspicious for evolving acute to subacute ischemic infarcts (series 2, images 22, 20, 19). No acute intracranial hemorrhage. Additionally, there is question of subtle loss of gray-white matter differentiation with loss of pole sulcation about the cerebellum, nonspecific, but could reflect superimposed changes of anoxic injury given provided history. The supratentorial gray-white matter differentiation is otherwise comparatively well maintained. No mass lesion or midline shift. No hydrocephalus or extra-axial fluid collection. Vascular: No abnormal hyperdense vessel. Skull: Scalp soft tissues within normal limits.  Calvarium intact. Sinuses/Orbits: Globes orbital soft tissues within normal limits. Mild mucoperiosteal thickening present about the paranasal sinuses. Trace bilateral mastoid effusions noted. Nasogastric tube in place. Other: None. IMPRESSION: 1. Increased prominence of several scattered patchy hypodensities involving the periventricular and deep white matter of both cerebral hemispheres, suspicious for evolving acute to subacute ischemic infarcts. 2. Question subtle loss of gray-white matter differentiation about the cerebellum since prior. While this finding could potentially be artifactual/technical in nature on this exam, possible changes of superimposed anoxic injury could be considered given provided history. Correlation with dedicated MRI could be performed for further evaluation as warranted. Electronically Signed   By: Morene Hoard M.D.   On: 08/01/2024 23:59   EEG adult Result Date: 08/01/2024 Shelton Arlin KIDD, MD     08/01/2024  1:35 PM Patient Name: Zarif Rathje MRN: [id number] Epilepsy Attending: Arlin KIDD Shelton Referring Physician/Provider: Claudene Toribio BROCKS, MD Date: 08/01/2024 Duration: 22.20 mins Patient history: 78 year old male status post cardiac arrest.  EEG to evaluate for seizure. Level of alertness: comatose AEDs during EEG study: Propofol  Technical aspects: This EEG study was done with scalp electrodes positioned according to the 10-20 International system of electrode placement. Electrical activity was reviewed with band pass filter of 1-70Hz , sensitivity of 7 uV/mm, display speed of 57mm/sec with a 60Hz  notched filter applied as appropriate. EEG data were recorded continuously and digitally stored.  Video monitoring was available and reviewed as appropriate. Description: EEG showed continuous generalized background suppression, not reactive to stimulation.  Hyperventilation and photic stimulation were not performed.   ABNORMALITY - Background suppression, generalized IMPRESSION: This study is suggestive of profound diffuse encephalopathy.  No seizures or epileptiform discharges were seen throughout the recording. Arlin MALVA Krebs   ECHOCARDIOGRAM LIMITED Result Date: 07/31/2024    ECHOCARDIOGRAM LIMITED REPORT   Patient Name:   PIO EATHERLY Date of Exam: 07/31/2024 Medical Rec #:  [id number]     Height:       73.0 in Accession #:    [id number]    Weight:       168.9 lb Date of Birth:  08-02-1946     BSA:          2.002 m Patient Age:    78 years      BP:           134/42 mmHg Patient Gender: M             HR:           76 bpm. Exam Location:  Inpatient Procedure: Limited Echo, Color Doppler and Intracardiac Opacification Agent            (Both Spectral and Color Flow Doppler were utilized during            procedure). Indications:     Cardiac Arrest  History:         Patient has prior history of Echocardiogram examinations, most                  recent 07/19/2024. Abnormal ECG, Pulmonary Emoblus;                  Arrythmias:Cardiac Arrest.  Sonographer:     Logan Shove RDCS Referring Phys:  [id number] TORIBIO JAYSON SHARPS Diagnosing Phys: Dorn Ross MD IMPRESSIONS  1. Left ventricular ejection fraction, by estimation, is 70 to 75%. The left ventricle has  hyperdynamic function. Left ventricular endocardial border not optimally defined to evaluate regional wall motion. There is moderate left ventricular hypertrophy.  2. RV not well visualized, grossly appears normal in size and function. . Right ventricular systolic function was not well visualized. The right ventricular size is not well visualized.  3. Limited echocardiogram FINDINGS  Left Ventricle: Left ventricular ejection fraction, by estimation, is 70 to 75%. The left ventricle has hyperdynamic function. Left ventricular endocardial border not optimally defined to evaluate regional wall motion. Definity contrast agent was given IV to delineate the left ventricular endocardial borders. There is moderate left ventricular hypertrophy. Right Ventricle: RV not well visualized, grossly appears normal in size and function. The right ventricular size is not well visualized. Right vetricular wall thickness was not well visualized. Right ventricular systolic function was not well visualized. Pericardium: There is no evidence of pericardial effusion. Venous: IVC assessment for right atrial pressure unable to be performed due to mechanical ventilation. LEFT VENTRICLE PLAX 2D LVIDd:         3.80 cm LVIDs:  2.40 cm LV PW:         1.10 cm LV IVS:        1.40 cm  LV Volumes (MOD) LV vol d, MOD A2C: 105.0 ml LV vol d, MOD A4C: 68.7 ml LV vol s, MOD A2C: 36.7 ml LV vol s, MOD A4C: 25.8 ml LV SV MOD A2C:     68.3 ml LV SV MOD A4C:     68.7 ml LV SV MOD BP:      57.6 ml IVC IVC diam: 2.30 cm LEFT ATRIUM           Index LA diam:      2.30 cm 1.15 cm/m LA Vol (A2C): 14.1 ml 7.04 ml/m LA Vol (A4C): 39.9 ml 19.93 ml/m   AORTA Ao Root diam: 3.40 cm Dorn Ross MD Electronically signed by Dorn Ross MD Signature Date/Time: 07/31/2024/2:41:13 PM    Final (Updated)    CT CHEST ABDOMEN PELVIS WO CONTRAST Result Date: 07/31/2024 EXAM: CT CHEST, ABDOMEN AND PELVIS WITHOUT CONTRAST 07/31/2024 11:20:34 AM TECHNIQUE: CT of  the chest, abdomen and pelvis was performed without the administration of intravenous contrast. Multiplanar reformatted images are provided for review. Automated exposure control, iterative reconstruction, and/or weight based adjustment of the mA/kV was utilized to reduce the radiation dose to as low as reasonably achievable. COMPARISON: CTA chest 07/17/2024. CLINICAL HISTORY: 78 year old male. Status post aborted C7-T1 ACDF. Suspected left neck hematoma. Sepsis. FINDINGS: CHEST: MEDIASTINUM AND LYMPH NODES: Left anterior lower neck skin staples, near the thoracic inlet. Underlying homogeneous intermediate to slightly hyperdense masslike collection measures slightly larger than 69 x 36 x 43 mm (AP x transverse x CC). Not completely included., with visible Estimated volume: 56 mL. Mild rightward mass effect on the trachea and thyroid at the thoracic inlet. Intubated, endotracheal tube tip above the clavicles. Small volume retained secretions in the trachea and at the tip of the endotracheal tube. Major airways remain patent. Enteric tube within the esophagus terminates in the distal stomach. Bilateral subclavian approach vascular catheters terminate in the superior vena cava. Normal heart size. No pericardial effusion. No mediastinal, hilar or axillary lymphadenopathy. LUNGS AND PLEURA: Moderate to severe right lower lobe consolidation with air bronchograms. Virtually complete lobar involvement. Contralateral posterior basal segment left lower lobe consolidation, with additional patchy peribronchial opacity in the superior segment. Small layering left pleural effusion. No significant right pleural effusion. Upper lobes and middle lobes essentially clear now. No pneumothorax. ABDOMEN AND PELVIS: LIVER: The liver is unremarkable. GALLBLADDER AND BILE DUCTS: Vicarious contrast excretion to the gallbladder. No biliary ductal dilatation. SPLEEN: No acute abnormality. PANCREAS: No acute abnormality. ADRENAL GLANDS: No  acute abnormality. KIDNEYS, URETERS AND BLADDER: Diminutive urinary bladder. No stones in the kidneys or ureters. No hydronephrosis. No perinephric or periureteral stranding. GI AND BOWEL: Small volume of oral contrast in the gastric antrum. Fluid containing but largely decompressed small and large bowel loops throughout the abdomen and pelvis. Rectal tube is in place. No bowel obstruction. REPRODUCTIVE ORGANS: No acute abnormality. PERITONEUM AND RETROPERITONEUM: No ascites. No free air. No pelvis free fluid. VASCULATURE: Normal caliber abdominal aorta. Mild free calcified abdominal aortic atherosclerosis. ABDOMINAL AND PELVIS LYMPH NODES: No lymphadenopathy. BONES AND SOFT TISSUES: Thoracic spine hyperostosis with flowing osteophytes and multilevel subsequent interbody ankylosis. No acute osseous abnormality. IMPRESSION: 1. Right > lower lobe consolidative Pneumonia. Small layering left pleural effusion. 2. Left anterior lower neck collection underlying skin staples appears to be hematoma, with estimated volume slightly greater  than 56 mL. Mild rightward mass effect on the trachea and thyroid at the thoracic inlet. 3. Satisfactory endotracheal tube, enteric tube terminating in the distal stomach, satisfactory rectal tube, and bilateral subclavian vascular catheters terminating in the SVC Electronically signed by: Helayne Hurst MD 07/31/2024 12:00 PM EST RP Workstation: [id number]   DG Chest Port 1 View Result Date: 07/31/2024 EXAM: 1 VIEW(S) XRAY OF THE CHEST 07/31/2024 11:23:00 AM COMPARISON: 07/31/2024 CLINICAL HISTORY: Encounter for central line placement [id number] FINDINGS: LINES, TUBES AND DEVICES: Left central venous catheter in place with tip over the SVC. Right central venous catheter in place with tip over the region of the confluence of the subclavian and brachiocephalic veins. ETT in place with tip 3.5 cm above the carina. Enteric tube courses through the chest to the abdomen beyond the field-of-view.  LUNGS AND PLEURA: Progressive opacities within the right lower lobe concerning for pneumonia. No pulmonary edema. No pleural effusion. No pneumothorax. HEART AND MEDIASTINUM: No acute abnormality of the cardiac and mediastinal silhouettes. BONES AND SOFT TISSUES: Surgical staples noted over the lower neck. No acute osseous abnormality. IMPRESSION: 1. Progressive right lower lobe airspace opacity concerning for pneumonia or aspiration. Electronically signed by: Waddell Calk MD 07/31/2024 11:54 AM EST RP Workstation: [id number]   CT HEAD WO CONTRAST ( ) Result Date: 07/31/2024 EXAM: CT HEAD WITHOUT CONTRAST 07/31/2024 11:20:34 AM TECHNIQUE: CT of the head was performed without the administration of intravenous contrast. Automated exposure control, iterative reconstruction, and/or weight based adjustment of the mA/kV was utilized to reduce the radiation dose to as low as reasonably achievable. COMPARISON: 07/28/2024 CLINICAL HISTORY: Delirium FINDINGS: BRAIN AND VENTRICLES: No acute hemorrhage. No evidence of acute infarct. No hydrocephalus. No extra-axial collection. No mass effect or midline shift. Periventricular white matter changes, likely sequela of chronic small vessel ischemic disease. Age-related cerebral atrophy. 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 ORBITS: No acute abnormality. Bilateral lens replacements. SINUSES: Scattered mucosal thickening in the paranasal sinuses. Trace fluid in the right mastoid air cells. Small amount of fluid layering in the nasopharynx. SOFT TISSUES AND SKULL: No acute soft tissue abnormality. No skull fracture. Nasogastric tube in place. IMPRESSION: 1. No acute intracranial abnormality. 2. 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 3. Similar chronic small vessel ischemic changes. 4. Age-related cerebral atrophy. 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    Dorn KANDICE Ned 08/15/2024, 11:05 AM

## 2024-08-22 NOTE — Progress Notes (Signed)
 Pharmacy Electrolyte Replacement  Recent Labs:  Recent Labs    07/29/2024 0433  K 3.9  3.8  MG 2.4  PHOS 2.9  2.9  CREATININE 1.92*  1.89*    Low Critical Values (K </= 2.5, Phos </= 1, Mg </= 1) Present: Phos = 2.9 from 4.4  Plan: Phos-Nak 2 packets every 4 hours for 4 doses

## 2024-08-22 NOTE — Progress Notes (Signed)
 Comfort assessment done. Emotional support given to family. No needs at this time.

## 2024-08-22 NOTE — Progress Notes (Signed)
 PT Cancellation/Discharge Note  Patient Details Name: Lawrence [name] MRN: [id number] DOB: Dec 21, 1945   Cancelled Treatment:    Reason Eval/Treat Not Completed: Medical issues which prohibited therapy  Patient intubated with RASS-5 and noted poor prognosis. PT will sign-off at this time. Please reorder if appropriate.    Macario RAMAN, PT Acute Rehabilitation Services  Office [phone]  Macario SHAUNNA Soja 08/04/2024, 8:01 AM

## 2024-08-22 NOTE — Progress Notes (Signed)
 NAME:  Lawrence [name], MRN:  [id number], DOB:  [birth year], LOS: 16 ADMISSION DATE:  07/16/2024, CONSULTATION DATE: 07/17/2024 REFERRING MD:  Debby Carrier , CHIEF COMPLAINT: Status postcardiac arrest  History of Present Illness:  78 year old male with diabetes, hypertension, BPH status post TURP who presented 10/25 after having a ground-level fall with recent onset of numbness in his lower extremity- complete loss of sensation to BLE & urinary incontinence after fall.  Neurosurgery was consulted, patient taken to OR for partial anterior cervical discectomy C7-T1 but course was complicated with multiple time PEA cardiac arrest in OR, procedure was aborted, TEE was done which showed severe RV dysfunction and dilatation with moderate to severe LV dysfunction, patient was transferred to ICU for close monitoring.    In ICU patient blood pressure started dropping and he went into PEA cardiac arrest again, ROSC was achieved after 2 minutes.  CTA PE study with extensive pulmonary artery thrombus bilaterally with evidence of right heart strain. Unfortunately due to surgery, it was recommended no TNK/TPA. 10/27 taken for thrombectomy.   Pertinent  Medical History   Past Medical History:  Diagnosis Date   Arthritis    Diabetes mellitus without complication (HCC)    GERD (gastroesophageal reflux disease)    Hypertension    Sleep apnea     Significant Hospital Events: Including procedures, antibiotic start and stop dates in addition to other pertinent events   Admitted and PCCM consulted, coded multiple times in OR (PEA) 07/18/2024 mechanical thrombectomy was done for bilateral pulmonary embolism. 10/28 nephro consult 10/29 on 5 NE, plan for HD cath placement and CRRT  10/30 add midodrine  11/3 remain on CRRT, came off of vasopressors and inotropic support. 11/4 CRRT was stopped, not making much urine 11/5  Made 50 cc of urine, serum creatinine and potassium trended up 11/9 recurrent arrest on  floor  Interim History / Subjective:  Family at bedside. Remains unresponsive.  Objective    Blood pressure (!) 141/66, pulse 85, temperature 99 F (37.2 C), temperature source Esophageal, resp. rate 11, height 6' 0.99 (1.854 m), weight 72.1 kg, SpO2 100%.    Vent Mode: PRVC FiO2 (%):  [40 %] 40 % Set Rate:  [10 bmp] 10 bmp Vt Set:  [580 mL] 580 mL PEEP:  [5 cmH20] 5 cmH20 Plateau Pressure:  [14 cmH20-25 cmH20] 20 cmH20   Intake/Output Summary (Last 24 hours) at 08/01/2024 1504 Last data filed at 08/11/2024 1200 Gross per 24 hour  Intake 1522 ml  Output 1350.8 ml  Net 171.2 ml   Filed Weights   07/31/24 0500 08/01/24 0500 08/18/2024 0500  Weight: 76.6 kg 71.6 kg 72.1 kg    Examination: Triggers vent C collar in place Stable hematoma Unresponsive otherwise  CT reviewed has scattered strokes and some grey-white differentiation loss posteriorly  Resolved problem list  Lactic acidosis  Endotracheally intubated Bilateral multifocal pneumonia Cardiogenic shock Acute respiratory failure with hypoxia and hypercapnia  Assessment and Plan  Status post PEA cardiac arrest in the setting of bilateral massive PE status post mechanical thrombectomy Acute RV failure, improving Rib fractures in the setting of CPR Status post mechanical fall with C7/T1 spinal cord injury and displaced C7 vertebral fracture 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  L neck anterior hematoma, ABLA- with airway compromise  now, will need addressed if working toward extubation  Met with family along with PMT.  MPOA is wife.  Daughter and son also present.  Discussed CT findings, ongoing coma, spinal cord injury, recurrent arrest, ongoing HD needs.  It is clear patient would not have wanted HD.  All agree that patient also appears to be suffering with a terminal CNS event on top of all of the above issues.  Wife does not want to see him continue like this neither does daughter.   Son is having trouble but understands that we need to honor what his father would have wanted.  Will transition patient to a comfort-based approach and allow a natural passing with his wife and kids at bedside.  Condolences offered.  34 min cc time Rolan Sharps MD PCCM

## 2024-08-22 NOTE — Progress Notes (Signed)
 eLink Physician-Brief Progress Note Patient Name: Lawrence [name] DOB: 11-18-1945 MRN: [id number]   Date of Service  08/04/2024  HPI/Events of Note  Patient with low diastolic pressure / wide pulse pressure impacting his MAP on CRRT.  eICU Interventions  Midodrine added via enteral tube.        Lawrence [name] 07/26/2024, 1:38 AM

## 2024-08-22 NOTE — Plan of Care (Signed)
  Problem: Clinical Measurements: Goal: Cardiovascular complication will be avoided Outcome: Progressing   Problem: Activity: Goal: Risk for activity intolerance will decrease Outcome: Progressing   Problem: Nutrition: Goal: Adequate nutrition will be maintained Outcome: Progressing   Problem: Safety: Goal: Ability to remain free from injury will improve Outcome: Progressing

## 2024-08-22 NOTE — Progress Notes (Signed)
Pt extubated to comfort care measures.

## 2024-08-22 NOTE — Progress Notes (Signed)
 Daily Progress Note   Patient Name: Lawrence [name]       Date: 07/26/2024 DOB: 02-Feb-1946  Age: 78 y.o. MRN#: [id number] Attending Physician: Claudene Toribio BROCKS, MD Primary Care Physician: Viviana Candyce HERO, MD Admit Date: 07/16/2024 Length of Stay: 16 days  Reason for Consultation/Follow-up: Establishing goals of care  Subjective:  Subjective:  EMR reviewed including notes from critical care, nephrology, neurosurgery, and results of EEG reviewed.  CT scan reviewed with loss of gray-white matter differentiation indicative of anoxic brain injury.  Labs reviewed with sodium 136, potassium 3.9, creatinine 1.92, hemoglobin 8.7.  Patient noted on CRRT.  Events overnight noted with loss of HD cath and subclavian and replacement in femoral region.  Met today for family meeting in conjunction with Dr. Claudene, Fairy Kemps, NP from palliative care, bedside RN, patient's wife, son, daughter, and son-in-law.  Patient's sister also joined meeting via phone.  Dr. Claudene reviewed current medical situation including new CT scan consistent with anoxic brain injury following another event of cardiac arrest.  Poor prognosis discussed.   Patient's son expressed frustration and that he felt patient needed more time before making decision about potential transition to comfort care due to overwhelmingly poor prognosis.  Stated that he feels physicians are overly negative about potential for recovery and also expressed frustration that he and his father have been working on their relationship.  He expressed needing his father to recover enough for them to be able to have closure before he dies.  Other family members were given the opportunity to share their thoughts.  Wife, daughter, and sister expressed that they do not feel that the patient would want to continue in his current state as he is not showing signs of recovery and would not find this to be acceptable quality of life.  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  We discussed that in a best case scenario, he would be severely debilitated and dependent upon others and continued aggressive interventions such as dialysis for the rest of his life.  His sister expressed that he was a very proud man who would not want to be stuck in a wheelchair let alone be dependent on dialysis and living in a long-term care facility.  His wife tearfully expressed that she wants him at peace and does not think that he would want to continue with aggressive care plan.  Patient's son resistant to consideration for transition to comfort and expressed frustration regarding family dynamics.   I asked his wife what she thinks patient would say if he were present in the room and able to understand his situation and speak for himself.  She again expressed that she thinks that he would want to focus on comfort rather than continued aggressive care and dependence on life support.  Family asked what things would look like if transition was made to comfort care.  I reviewed plan for comfort care including focus on aggressive treatment of pain, anxiety, dyspnea, or other symptoms as they arise.  We discussed utilization of opioids and anxiolytics for symptom management and that transition to comfort would include liberation from CRRT, pressor support, and mechanical ventilation.  Questions answered regarding care plan.  Offered time and space to family to discuss.  Will continue to remain available as needed for further discussion or support moving forward.  Review of Systems Unable to obtain  Objective:  Vital Signs:  BP (!) 149/58   Pulse 72   Temp 99 F (37.2 C) (Esophageal)   Resp (!) 23   Ht 6' 0.99 (1.854 m)   Wt 72.1 kg   SpO2 100%   BMI 20.98 kg/m   Physical Exam: General: unresponsive Eyes: partially open.  No blink HENT: hematoma noted, ET tube in  plave Cardiovascular: RRR Respiratory: vent support Neuro: unresponsive Imaging: @IMAGES @  I personally reviewed recent imaging.   Assessment & Plan:   Assessment: 78 y.o. 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 Hospitalization has been complicated by multiple PEA arrest. Patient had multiple PEA arrests in the OR during attempt of ACDF of C7-T1. Another cardiac arrest in the ICU after becoming hypotensive and found to have bilateral pulmonary embolism s/p mechanical thrombectomy (10/27). Patient transferred out of ICU from 11/6-11/9 and had another cardiac arrest on 11/9 due to hypoventilation.   Has been unresponsive since that time.  Recommendations/Plan: # Complex medical decision making/goals of care:  - Family meeting today as above.  Patient's wife feels that he would want to transition to focus on comfort if he were to understand his situation.  Son has expressed frustration with medical care and poor prognosis.  Stated he feels physicians are overly negative about potential recovery.  - Following meeting, plan for time for family to discuss.  Other than his son, his family (including wife who would be his legal surrogate) expressed that they think patient's desire would be to focus on comfort moving forward.  Potential transition to comfort care based upon family decision.  -  Code Status: Do not attempt resuscitation (DNR) - Comfort care  Prognosis: Hours - Days  # Symptom management: Patient is receiving these palliative interventions for symptom management with an intent to improve quality of life.   - Pain: Currently on opioid infusion, although this has been turned off for a period to see if mental status improves.  # Psychosocial Support:  - Family.  Complicated dynamics.  # Discharge Planning: Most likely will be a hospital death.  Discussed with: Family including wife, son, daughter,  son-in-law, and sister.  Dr. Claudene.  Bedside RN.  Fairy Kemps, NP  Thank you for allowing the palliative care team to participate in the care [facility].  Amaryllis Meissner, MD Palliative Care Provider PMT # [phone]  If patient remains symptomatic despite maximum doses, please call PMT at [phone] between 0700 and 1900. Outside of these hours, please call attending, as PMT does not have night coverage.   I personally spent a total of 60 minutes in the care of the patient today including preparing to see the patient, getting/reviewing separately obtained history, performing a medically appropriate exam/evaluation, counseling and educating, referring and communicating with other health care professionals, and documenting clinical information in the EHR.

## 2024-08-22 DEATH — deceased

## 2024-10-12 ENCOUNTER — Telehealth: Payer: Self-pay | Admitting: Family Medicine

## 2024-10-12 NOTE — Telephone Encounter (Signed)
 Referral closed in [facility] exchange. Patient deceased.
